# Patient Record
Sex: Female | Born: 1960 | Race: White | Hispanic: No | Marital: Married | Smoking: Never smoker
Health system: Southern US, Community
[De-identification: ages and names within clinical notes are randomized; demographics above are authoritative.]

## PROBLEM LIST (undated history)

## (undated) DIAGNOSIS — D509 Iron deficiency anemia, unspecified: Secondary | ICD-10-CM

## (undated) DIAGNOSIS — K760 Fatty (change of) liver, not elsewhere classified: Secondary | ICD-10-CM

## (undated) DIAGNOSIS — I890 Lymphedema, not elsewhere classified: Secondary | ICD-10-CM

## (undated) DIAGNOSIS — M545 Low back pain, unspecified: Secondary | ICD-10-CM

## (undated) DIAGNOSIS — M1990 Unspecified osteoarthritis, unspecified site: Secondary | ICD-10-CM

## (undated) DIAGNOSIS — K900 Celiac disease: Secondary | ICD-10-CM

## (undated) DIAGNOSIS — M5412 Radiculopathy, cervical region: Secondary | ICD-10-CM

## (undated) DIAGNOSIS — G5603 Carpal tunnel syndrome, bilateral upper limbs: Secondary | ICD-10-CM

## (undated) DIAGNOSIS — D802 Selective deficiency of immunoglobulin A [IgA]: Secondary | ICD-10-CM

## (undated) DIAGNOSIS — R768 Other specified abnormal immunological findings in serum: Secondary | ICD-10-CM

## (undated) DIAGNOSIS — R519 Headache, unspecified: Secondary | ICD-10-CM

## (undated) DIAGNOSIS — I10 Essential (primary) hypertension: Secondary | ICD-10-CM

## (undated) DIAGNOSIS — G5601 Carpal tunnel syndrome, right upper limb: Secondary | ICD-10-CM

## (undated) DIAGNOSIS — T8859XA Other complications of anesthesia, initial encounter: Secondary | ICD-10-CM

## (undated) DIAGNOSIS — J189 Pneumonia, unspecified organism: Secondary | ICD-10-CM

## (undated) DIAGNOSIS — R7689 Other specified abnormal immunological findings in serum: Secondary | ICD-10-CM

## (undated) DIAGNOSIS — D751 Secondary polycythemia: Secondary | ICD-10-CM

## (undated) DIAGNOSIS — G5793 Unspecified mononeuropathy of bilateral lower limbs: Secondary | ICD-10-CM

## (undated) DIAGNOSIS — R42 Dizziness and giddiness: Secondary | ICD-10-CM

## (undated) DIAGNOSIS — Z148 Genetic carrier of other disease: Secondary | ICD-10-CM

## (undated) DIAGNOSIS — K9041 Non-celiac gluten sensitivity: Secondary | ICD-10-CM

## (undated) DIAGNOSIS — K766 Portal hypertension: Secondary | ICD-10-CM

## (undated) DIAGNOSIS — G5602 Carpal tunnel syndrome, left upper limb: Secondary | ICD-10-CM

## (undated) DIAGNOSIS — M7500 Adhesive capsulitis of unspecified shoulder: Secondary | ICD-10-CM

## (undated) DIAGNOSIS — I340 Nonrheumatic mitral (valve) insufficiency: Secondary | ICD-10-CM

## (undated) DIAGNOSIS — G43909 Migraine, unspecified, not intractable, without status migrainosus: Secondary | ICD-10-CM

## (undated) DIAGNOSIS — G709 Myoneural disorder, unspecified: Secondary | ICD-10-CM

## (undated) DIAGNOSIS — K7460 Unspecified cirrhosis of liver: Secondary | ICD-10-CM

## (undated) DIAGNOSIS — K219 Gastro-esophageal reflux disease without esophagitis: Secondary | ICD-10-CM

## (undated) DIAGNOSIS — D649 Anemia, unspecified: Secondary | ICD-10-CM

## (undated) HISTORY — PX: APPENDECTOMY: SHX54

## (undated) HISTORY — PX: COLONOSCOPY WITH ESOPHAGOGASTRODUODENOSCOPY (EGD): SHX5779

## (undated) HISTORY — PX: JOINT REPLACEMENT: SHX530

## (undated) HISTORY — PX: TONSILLECTOMY: SUR1361

## (undated) HISTORY — PX: CHOLECYSTECTOMY: SHX55

## (undated) HISTORY — PX: WISDOM TOOTH EXTRACTION: SHX21

---
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
## 1981-12-19 HISTORY — PX: CHOLECYSTECTOMY: SHX55

## 2010-09-23 ENCOUNTER — Ambulatory Visit: Payer: Self-pay | Admitting: General Practice

## 2011-01-31 ENCOUNTER — Ambulatory Visit: Payer: Self-pay | Admitting: General Practice

## 2011-02-14 ENCOUNTER — Inpatient Hospital Stay: Payer: Self-pay | Admitting: General Practice

## 2011-02-14 HISTORY — PX: TOTAL HIP ARTHROPLASTY: SHX124

## 2011-05-15 ENCOUNTER — Ambulatory Visit: Payer: Self-pay | Admitting: General Practice

## 2011-09-08 ENCOUNTER — Ambulatory Visit: Payer: Self-pay | Admitting: Anesthesiology

## 2011-09-14 ENCOUNTER — Ambulatory Visit: Payer: Self-pay | Admitting: General Practice

## 2011-09-14 HISTORY — PX: KNEE ARTHROSCOPY: SHX127

## 2013-02-05 ENCOUNTER — Ambulatory Visit: Payer: Self-pay | Admitting: General Practice

## 2013-02-05 LAB — BASIC METABOLIC PANEL
Anion Gap: 6 — ABNORMAL LOW (ref 7–16)
BUN: 12 mg/dL (ref 7–18)
Calcium, Total: 9.4 mg/dL (ref 8.5–10.1)
Chloride: 107 mmol/L (ref 98–107)
Co2: 26 mmol/L (ref 21–32)
Creatinine: 0.58 mg/dL — ABNORMAL LOW (ref 0.60–1.30)
EGFR (African American): >60
EGFR (Non-African Amer.): >60
Glucose: 102 mg/dL — ABNORMAL HIGH (ref 65–99)
Osmolality: 277 (ref 275–301)
Potassium: 4 mmol/L (ref 3.5–5.1)
Sodium: 139 mmol/L (ref 136–145)

## 2013-02-05 LAB — APTT: Activated PTT: 26.6 secs (ref 23.6–35.9)

## 2013-02-05 LAB — URINALYSIS, COMPLETE
Bacteria: NONE SEEN
Bilirubin,UR: NEGATIVE
Blood: NEGATIVE
Glucose,UR: NEGATIVE mg/dL (ref 0–75)
Ketone: NEGATIVE
Leukocyte Esterase: NEGATIVE
Nitrite: NEGATIVE
Ph: 6 (ref 4.5–8.0)
Protein: NEGATIVE
RBC,UR: <1 /HPF (ref 0–5)
Specific Gravity: 1.017 (ref 1.003–1.030)
Squamous Epithelial: 1
WBC UR: <1 /HPF (ref 0–5)

## 2013-02-05 LAB — CBC
HCT: 46.7 % (ref 35.0–47.0)
MCH: 31.8 pg (ref 26.0–34.0)
MCHC: 34.6 g/dL (ref 32.0–36.0)
Platelet: 236 10*3/uL (ref 150–440)

## 2013-02-05 LAB — SEDIMENTATION RATE: Erythrocyte Sed Rate: 2 mm/hr (ref 0–30)

## 2013-02-05 LAB — PROTIME-INR: INR: 0.9

## 2013-02-05 LAB — MRSA PCR SCREENING

## 2013-02-07 LAB — URINE CULTURE

## 2013-02-18 ENCOUNTER — Inpatient Hospital Stay: Payer: Self-pay | Admitting: General Practice

## 2013-02-18 HISTORY — PX: TOTAL KNEE ARTHROPLASTY: SHX125

## 2013-02-19 LAB — BASIC METABOLIC PANEL
BUN: 10 mg/dL (ref 7–18)
Chloride: 105 mmol/L (ref 98–107)
Co2: 28 mmol/L (ref 21–32)
EGFR (African American): >60
Glucose: 135 mg/dL — ABNORMAL HIGH (ref 65–99)
Osmolality: 275 (ref 275–301)
Potassium: 3.7 mmol/L (ref 3.5–5.1)
Sodium: 137 mmol/L (ref 136–145)

## 2013-02-19 LAB — PLATELET COUNT: Platelet: 186 10*3/uL (ref 150–440)

## 2013-02-19 LAB — HEMOGLOBIN: HGB: 14.2 g/dL (ref 12.0–16.0)

## 2013-02-20 LAB — BASIC METABOLIC PANEL
Anion Gap: 7 (ref 7–16)
Calcium, Total: 8.7 mg/dL (ref 8.5–10.1)
Chloride: 104 mmol/L (ref 98–107)
Co2: 26 mmol/L (ref 21–32)
Creatinine: 0.54 mg/dL — ABNORMAL LOW (ref 0.60–1.30)
Glucose: 126 mg/dL — ABNORMAL HIGH (ref 65–99)
Potassium: 3.7 mmol/L (ref 3.5–5.1)
Sodium: 137 mmol/L (ref 136–145)

## 2013-02-20 LAB — PLATELET COUNT: Platelet: 185 10*3/uL (ref 150–440)

## 2013-02-20 LAB — HEMOGLOBIN: HGB: 13.8 g/dL (ref 12.0–16.0)

## 2013-04-24 ENCOUNTER — Ambulatory Visit: Payer: Self-pay | Admitting: Family Medicine

## 2013-08-22 ENCOUNTER — Ambulatory Visit: Payer: Self-pay | Admitting: Unknown Physician Specialty

## 2013-08-22 HISTORY — PX: COLONOSCOPY: SHX174

## 2014-07-20 ENCOUNTER — Emergency Department: Payer: Self-pay | Admitting: Emergency Medicine

## 2014-12-09 ENCOUNTER — Ambulatory Visit: Payer: Self-pay | Admitting: Family Medicine

## 2015-04-10 NOTE — Discharge Summary (Signed)
PATIENT NAME:  Tamara Dodson, Tamara Dodson MR#:  [id number] DATE OF BIRTH:  [birth year]  DATE OF [place] FordyceDMISSION:  02/18/2013 DATE OF DISCHARGE:  02/20/2013  ADMITTING DIAGNOSIS:  Degenerative arthrosis of the left knee.   DISCHARGE DIAGNOSIS:  Degenerative arthrosis of the left knee.   HISTORY:  The patient is a pleasant 54 year old female who has been [facility] for progression of left knee pain. She had reported some transient improvement in her left knee pain following cortisone injections as well as a series of Synvisc injections. However, she had progressed in discomfort to the left knee over the last several months. The left knee pain was noted be more symptomatic than the right. The pain was aggravated with weightbearing activities. She had localized most of the pain along the medial aspect of the knee. Occasionally, the patient was noted to have some swelling. She has had significant difficulty with arising from a sitting position as well as stair ambulation. At the time of surgery, she was not using any ambulatory aid. The patient actually has been having some difficulty performing her job duties because of the pain. Her pain had progressed to the point that it was significantly interfering with her activities of daily living. [facility] taken [facility] showed narrowing of the medial cartilage space with associated varus alignment. She was noted to have osteophytes as well as subchondral sclerosis. Ater discussion of the risks and benefits of surgical intervention, the patient expressed her understanding of the risks and benefits and agreed for plans for surgical intervention.   PROCEDURE:  Left total knee arthroplasty using computer-assisted navigation.   ANESTHESIA:  Femoral nerve block with spinal.   SOFT TISSUE RELEASE:  Anterior cruciate ligament, posterior cruciate ligament, deep and superficial medial collateral ligaments as well as the patellofemoral ligament.   IMPLANTS UTILIZED:   DePuy PFC Sigma size 4 posterior stabilized femoral component (cemented), size 4 MBT tibial component (cemented), 38 mm 3-peg oval dome patella (cemented), and a 10 mm stabilized rotating platform polyethylene insert.   HOSPITAL COURSE:  The patient tolerated the procedure very well. She had no complications. She was then taken to the PACU where she was stabilized and then transferred to the orthopedic floor. The patient began receiving anticoagulation therapy of Lovenox 40 mg subcutaneous q. 12 hours per anesthesia and pharmacy protocol. She was fitted with TED stockings bilaterally. These were allowed to be removed 1 hour per 8 hour shift. The left one was applied on day 2 following removal of the Hemovac and dressing change. The patient was also fitted with the AV-I compression foot pumps bilaterally set at 80 mmHg. Her calves have been nontender. There has been no evidence of any DVTs. Heels were elevated off the bed using rolled towels.   The patient has denied any chest pain or shortness of breath. Vital signs have been stable. She has been afebrile. Hemodynamically, she was stable. No transfusions were given other than the Autovac transfusion given the first 6 hours postoperatively.   Physical therapy was initiated on day 1 for gait training and transfers. Upon being discharged, she was ambulating greater than 400 feet. She was able to go up and down 4 sets of steps. She was independent with bed to chair transfers. Occupational therapy was also initiated on day 1 for activities of daily living and assistive devices.   The patient's IV, Foley and Hemovac were discontinued on day 2 along with the dressing change. The stocking was applied to the surgical leg.  The Polar Care was reapplied to the surgical leg as well to maintain a temperature of 40 to 50 degrees Fahrenheit.   DISPOSITION:   1.  The patient is being discharged to home in improved stable condition.  2.  Continue weightbearing as  tolerated.  3.  Continue using a walker until cleared by physical therapy to go to a quad cane.  4.  She will receive home health PT.  5.  Continue TED stockings. These are to be worn during the day, but may be removed at night.  6.  Continue Polar Care maintaining a temperature of 40 to 50 degrees Fahrenheit.  7.  She is placed on a regular diet.  8.  She is to follow up in the clinic in 2 weeks.  9.  She is to call the clinic sooner if any temperatures of 101.5 or greater or excessive bleeding.  10.  She is to resume her regular medication that she was on prior to admission.  11.  She is given a prescription for oxycodone 5 to 10 mg every 4 to 6 hours p.r.n. for severe pain and tramadol 50 to 100 mg every 4 to 6 hours p.r.n. for mild to moderate pain. Also, a prescription for Lovenox 40 mg subcutaneously daily for 14 days and then discontinue and begin taking one 81 mg enteric-coated aspirin per day.   PAST MEDICAL HISTORY:   1.  Chickenpox.  2.  Morbid obesity.    ____________________________ Van Clines, PA jrw:si D: 02/20/2013 15:44:45 ET T: 02/20/2013 16:48:48 ET JOB#: [id number]  cc: Van Clines, PA, <Dictator> JON WOLFE PA ELECTRONICALLY SIGNED 02/20/2013 19:57

## 2015-04-10 NOTE — Op Note (Signed)
PATIENT NAME:  Tamara FordyceKRESS, Tien L MR#:  [id number] DATE OF BIRTH:  06/24/1961  DATE OF PROCEDURE:  02/18/2013  PREOPERATIVE DIAGNOSIS: Degenerative arthrosis of the left knee.   POSTOPERATIVE DIAGNOSIS: Degenerative arthrosis of the left knee.   PROCEDURE PERFORMED: Left total knee arthroplasty using computer-assisted navigation.   SURGEON: Illene LabradorJames P. Hooten, M.D.  ASSISTANT:  Van ClinesJon Wolfe, PA  ANESTHESIA: Femoral nerve block and spinal.   ESTIMATED BLOOD LOSS: 100 mL.   FLUIDS REPLACED: 2000 mL crystalloid.   TOURNIQUET TIME: 98 minutes.   DRAINS: Two medium drains to reinfusion system.   SOFT TISSUE RELEASES: Anterior cruciate ligament, posterior cruciate ligament, deep and superficial medial collateral ligament, and patellofemoral ligament.   IMPLANTS UTILIZED: DePuy PFC Sigma size 4 posterior stabilized femoral component (cemented), size 4 MBT tibial component (cemented), 38 mm 3-pegged oval dome patella (cemented), and a 10 mm stabilized rotating platform polyethylene insert.   INDICATIONS FOR SURGERY: The patient is a 54 year old female who has been seen for complaints of progressive left knee pain. X-rays demonstrated significant degenerative changes with relative varus deformity. After discussion of the risks and benefits of surgical intervention, the patient expressed understanding of the risks, benefits and agreed with plans for surgical intervention.   PROCEDURE IN DETAIL: The patient was brought to the operating room and, after adequate femoral nerve block and spinal anesthesia was achieved, a tourniquet was placed on the patient's upper left thigh. The patient's left knee and leg were cleaned and prepped and draped in the usual sterile fashion. A "timeout" was performed as per usual protocol. The left lower extremity was exsanguinated using an Esmarch, and the tourniquet was inflated to 300 mmHg. An anterior-longitudinal incision was made followed by a standard mid vastus approach. A  moderate effusion was evacuated. The deep fibers of the medial collateral ligament were elevated in subperiosteal fashion off the medial flare of the tibia so as to maintain a continuous soft tissue sleeve. The patella was subluxed laterally and the patellofemoral ligament was incised. Inspection of the knee demonstrated severe degenerative changes with full thickness loss of articular cartilage, most notably to the medial compartment. Prominent osteophytes were debrided using rongeur. Anterior and posterior cruciate ligaments were excised. Two 4.0 mm Schanz pins were inserted into the femur and into the tibia for attachment of the array of trackers used for computer-assisted navigation. Hip center was identified using a circumduction technique. Distal landmarks were mapped using the computer. The distal femur and proximal tibia were mapped using the computer. Distal femoral cutting guide was positioned using computer-assisted navigation so as to achieve 5 degree distal valgus cut. Cut was performed and verified using the computer. Distal femur was sized and it was felt that a size 4 femoral component was appropriate. A size 4 cutting guide was positioned and the anterior cut was performed and verified using the computer. This was followed by completion of the posterior and chamfer cuts. Femoral cutting guide for the central box was then positioned and the central box cut was performed. Attention was then directed to the proximal tibia. Medial and lateral menisci were excised. The extramedullary tibial cutting was positioned using computer-assisted navigation so as to achieve 0 degree varus valgus alignment and 0 degree posterior slope. Cut was performed and verified using the computer. The proximal tibia was sized and it was felt that a size 4 tibial tray was appropriate. Tibial and femoral trials were inserted followed by insertion of a 10 mm polyethylene insert. The knee was felt to  be tight medially. A Cobb  elevator was used to elevate the superficial fibers of the medial collateral ligament. This allowed for excellent mediolateral soft tissue balancing both in full extension and in flexion. Finally, the patella was cut and prepared so as to accommodate a 38 mm 3-pegged oval dome patella. Patellar trial was placed and the knee was placed through a range of motion with excellent patellar tracking appreciated.   Femoral trial was removed after debridement of posterior osteophytes. The central post hole for the tibial component was reamed followed by insertion of a keel punch. Tibial trials were removed. The cut surfaces of bone were irrigated with copious amounts of normal saline with antibiotic solution using pulsatile lavage and then suctioned dry. Polymethyl methacrylate cement with gentamicin was prepared in the usual fashion using a vacuum mixer. Cement was applied to the cut surface of the proximal tibia as well as along the undersurface of a size 4 MBT tibial component. Tibial component was positioned and impacted into place. Excess cement was removed using freer elevators. Cement was applied to the cut surface of the femur as well as along the posterior flanges of a size 4 posterior stabilized femoral component. Femoral component was positioned and impacted into place. Excess cement was removed using freer elevators. A 10 mm polyethylene trial was inserted and the knee was brought into full extension with steady axial compression applied. Finally, cement was applied to the backside of a 38 mm 3-pegged oval dome patella and the patellar component was positioned and patellar clamp applied. Excess cement was removed using freer elevators.   After adequate curing of cement, the tourniquet was deflated after total tourniquet time of 98 minutes. Hemostasis was achieved using electrocautery. The knee was irrigated with copious amounts of normal saline with antibiotic solution using pulsatile lavage and then  suctioned dry. The knee was inspected for any residual cement debris. 30 mL of 0.25% Marcaine with epinephrine was injected along the posterior capsule. A 10 mm stabilized rotating platform polyethylene insert was inserted and the knee was placed through a range of motion. Excellent patellar tracking was appreciated and excellent mediolateral soft tissue balancing was appreciated in extension and in flexion. Two medium drains were placed in the wound bed and brought out through a separate stab incision to be attached to a reinfusion system. The medial parapatellar portion of the incision was reapproximated using interrupted sutures of #1 Vicryl. The subcutaneous tissue was approximated in layers using first #0 Vicryl followed by #2-0 Vicryl. Skin was closed with skin staples. A sterile dressing was applied.   The patient tolerated the procedure well. She was transported to the recovery room in stable condition.  ____________________________ Illene Labrador. Angie Fava., MD jph:sb D: 02/18/2013 17:54:04 ET T: 02/19/2013 09:40:09 ET JOB#: [id number]  cc: Fayrene Fearing P. Angie Fava., MD, <Dictator> Illene Labrador Angie Fava MD ELECTRONICALLY SIGNED 02/26/2013 18:21

## 2015-09-03 ENCOUNTER — Encounter
Admission: RE | Admit: 2015-09-03 | Discharge: 2015-09-03 | Disposition: A | Discharge status: Home / Self Care | Payer: BLUE CROSS/BLUE SHIELD | Source: Ambulatory Visit | Attending: Orthopedic Surgery | Admitting: Orthopedic Surgery

## 2015-09-03 DIAGNOSIS — M1711 Unilateral primary osteoarthritis, right knee: Secondary | ICD-10-CM | POA: Insufficient documentation

## 2015-09-03 DIAGNOSIS — Z01812 Encounter for preprocedural laboratory examination: Secondary | ICD-10-CM | POA: Insufficient documentation

## 2015-09-03 DIAGNOSIS — Z01810 Encounter for preprocedural cardiovascular examination: Secondary | ICD-10-CM | POA: Diagnosis not present

## 2015-09-03 HISTORY — DX: Essential (primary) hypertension: I10

## 2015-09-03 HISTORY — DX: Unspecified osteoarthritis, unspecified site: M19.90

## 2015-09-03 HISTORY — DX: Lymphedema, not elsewhere classified: I89.0

## 2015-09-03 HISTORY — DX: Anemia, unspecified: D64.9

## 2015-09-03 LAB — CBC
HCT: 45.3 % (ref 35.0–47.0)
HEMOGLOBIN: 15.6 g/dL (ref 12.0–16.0)
MCH: 31.9 pg (ref 26.0–34.0)
MCHC: 34.3 g/dL (ref 32.0–36.0)
MCV: 92.8 fL (ref 80.0–100.0)
PLATELETS: 202 10*3/uL (ref 150–440)
RBC: 4.88 MIL/uL (ref 3.80–5.20)
RDW: 12.8 % (ref 11.5–14.5)
WBC: 9.2 10*3/uL (ref 3.6–11.0)

## 2015-09-03 LAB — URINALYSIS COMPLETE WITH MICROSCOPIC (ARMC ONLY)
BACTERIA UA: NONE SEEN
Bilirubin Urine: NEGATIVE
Glucose, UA: NEGATIVE mg/dL
HGB URINE DIPSTICK: NEGATIVE
Ketones, ur: NEGATIVE mg/dL
LEUKOCYTES UA: NEGATIVE
Nitrite: NEGATIVE
PH: 5 (ref 5.0–8.0)
PROTEIN: NEGATIVE mg/dL
Specific Gravity, Urine: 1.015 (ref 1.005–1.030)

## 2015-09-03 LAB — SURGICAL PCR SCREEN
MRSA, PCR: NEGATIVE
Staphylococcus aureus: POSITIVE — AB

## 2015-09-03 LAB — TYPE AND SCREEN
ABO/RH(D): O NEG
ANTIBODY SCREEN: NEGATIVE

## 2015-09-03 LAB — BASIC METABOLIC PANEL
Anion gap: 8 (ref 5–15)
BUN: 10 mg/dL (ref 6–20)
CALCIUM: 9.5 mg/dL (ref 8.9–10.3)
CO2: 26 mmol/L (ref 22–32)
CREATININE: 0.71 mg/dL (ref 0.44–1.00)
Chloride: 107 mmol/L (ref 101–111)
GFR calc non Af Amer: >60 mL/min (ref >60)
Glucose, Bld: 109 mg/dL — ABNORMAL HIGH (ref 65–99)
Potassium: 3.7 mmol/L (ref 3.5–5.1)
SODIUM: 141 mmol/L (ref 135–145)

## 2015-09-03 LAB — ABO/RH: ABO/RH(D): O NEG

## 2015-09-03 LAB — PROTIME-INR
INR: 1
PROTHROMBIN TIME: 13.4 s (ref 11.4–15.0)

## 2015-09-03 LAB — APTT: APTT: 26 s (ref 24–36)

## 2015-09-03 NOTE — Patient Instructions (Addendum)
  Your procedure is scheduled on: 09/21/15 Report to Day Surgery. To find out your arrival time please call [phone] between 1PM - 3PM on 9/30.  Remember: Instructions that are not followed completely may result in serious medical risk, up to and including death, or upon the discretion of your surgeon and anesthesiologist your surgery may need to be rescheduled.    __x__ 1. Do not eat food or drink liquids after midnight. No gum chewing or hard candies.     __x__ 2. No Alcohol for 24 hours before or after surgery.   ____ 3. Bring all medications with you on the day of surgery if instructed.    __x__ 4. Notify your doctor if there is any change in your medical condition     (cold, fever, infections).     Do not wear jewelry, make-up, hairpins, clips or nail polish.  Do not wear lotions, powders, or perfumes. You may wear deodorant.  Do not shave 48 hours prior to surgery. Men may shave face and neck.  Do not bring valuables to the hospital.    [facility] is not responsible for any belongings or valuables.               Contacts, dentures or bridgework may not be worn into surgery.  Leave your suitcase in the car. After surgery it may be brought to your room.  For patients admitted to the hospital, discharge time is determined by your                treatment team.   Patients discharged the day of surgery will not be allowed to drive home.   Please read over the following fact sheets that you were given:   Surgical Site Infection Prevention MRSA  ____ Take these medicines the morning of surgery with A SIP OF WATER:    1. Amlodipine  2. Tylenol as needed  3.   4.  5.  6.  ____ Fleet Enema (as directed)   __x__ Use CHG Soap as directed  ____ Use inhalers on the day of surgery  ____ Stop metformin 2 days prior to surgery    ____ Take 1/2 of usual insulin dose the night before surgery and none on the morning of surgery.   __x__ Stop Coumadin/Plavix/aspirin on  9/26  ___x_ Stop Anti-inflammatories on 9/26   __x__ Stop supplements until after surgery. 9/26 can continue Iron   ____ Bring C-Pap to the hospital.

## 2015-09-04 LAB — URINE CULTURE
Culture: NO GROWTH
Special Requests: NORMAL

## 2015-09-07 ENCOUNTER — Encounter
Admission: RE | Admit: 2015-09-07 | Discharge: 2015-09-07 | Disposition: A | Discharge status: Home / Self Care | Payer: BLUE CROSS/BLUE SHIELD | Source: Ambulatory Visit | Attending: Orthopedic Surgery | Admitting: Orthopedic Surgery

## 2015-09-07 DIAGNOSIS — Z01818 Encounter for other preprocedural examination: Secondary | ICD-10-CM | POA: Diagnosis not present

## 2015-09-07 LAB — SEDIMENTATION RATE: SED RATE: 1 mm/h (ref 0–30)

## 2015-09-08 NOTE — OR Nursing (Signed)
AS INSTRUCTED BY DR Charlann Boxer, REQUEST FOR CLEARANCE CALLED AND FAXED TO DR HOOTN OFFICE. SPOKE WITH LINDA

## 2015-09-14 DIAGNOSIS — I890 Lymphedema, not elsewhere classified: Secondary | ICD-10-CM | POA: Insufficient documentation

## 2015-09-14 DIAGNOSIS — I10 Essential (primary) hypertension: Secondary | ICD-10-CM | POA: Insufficient documentation

## 2015-09-15 NOTE — OR Nursing (Signed)
CLEARED BY DR Lady Gary

## 2015-09-21 ENCOUNTER — Encounter
Admission: RE | Disposition: A | Discharge status: Home / Self Care | Payer: Self-pay | Source: Ambulatory Visit | Attending: Orthopedic Surgery

## 2015-09-21 ENCOUNTER — Inpatient Hospital Stay: Payer: BLUE CROSS/BLUE SHIELD | Admitting: Anesthesiology

## 2015-09-21 ENCOUNTER — Inpatient Hospital Stay: Payer: BLUE CROSS/BLUE SHIELD

## 2015-09-21 ENCOUNTER — Inpatient Hospital Stay
Admission: RE | Admit: 2015-09-21 | Discharge: 2015-09-24 | DRG: 470 | Disposition: A | Discharge status: Home / Self Care | Payer: BLUE CROSS/BLUE SHIELD | Source: Ambulatory Visit | Attending: Orthopedic Surgery | Admitting: Orthopedic Surgery

## 2015-09-21 ENCOUNTER — Encounter: Payer: Self-pay | Admitting: *Deleted

## 2015-09-21 DIAGNOSIS — M25561 Pain in right knee: Secondary | ICD-10-CM | POA: Diagnosis present

## 2015-09-21 DIAGNOSIS — Z96652 Presence of left artificial knee joint: Secondary | ICD-10-CM

## 2015-09-21 DIAGNOSIS — Z7982 Long term (current) use of aspirin: Secondary | ICD-10-CM

## 2015-09-21 DIAGNOSIS — M1711 Unilateral primary osteoarthritis, right knee: Secondary | ICD-10-CM | POA: Diagnosis present

## 2015-09-21 DIAGNOSIS — Z6841 Body Mass Index (BMI) 40.0 and over, adult: Secondary | ICD-10-CM | POA: Diagnosis not present

## 2015-09-21 DIAGNOSIS — D649 Anemia, unspecified: Secondary | ICD-10-CM | POA: Diagnosis present

## 2015-09-21 DIAGNOSIS — I10 Essential (primary) hypertension: Secondary | ICD-10-CM | POA: Diagnosis present

## 2015-09-21 DIAGNOSIS — Z96659 Presence of unspecified artificial knee joint: Secondary | ICD-10-CM

## 2015-09-21 HISTORY — PX: KNEE ARTHROPLASTY: SHX992

## 2015-09-21 LAB — CREATININE, SERUM
CREATININE: 0.62 mg/dL (ref 0.44–1.00)
GFR calc Af Amer: >60 mL/min (ref >60)

## 2015-09-21 LAB — TYPE AND SCREEN
ABO/RH(D): O NEG
ANTIBODY SCREEN: NEGATIVE

## 2015-09-21 SURGERY — ARTHROPLASTY, KNEE, TOTAL, USING IMAGELESS COMPUTER-ASSISTED NAVIGATION
Anesthesia: Spinal | Laterality: Right | Wound class: Clean

## 2015-09-21 MED ORDER — MIDAZOLAM HCL 5 MG/5ML IJ SOLN
INTRAMUSCULAR | Status: DC | PRN
  Administered 2015-09-21: 1 mg via INTRAVENOUS
  Administered 2015-09-21: 2 mg via INTRAVENOUS
  Administered 2015-09-21: 1 mg via INTRAVENOUS

## 2015-09-21 MED ORDER — DM-GUAIFENESIN ER 30-600 MG PO TB12: 1.0000 | ORAL_TABLET | Freq: Two times a day (BID) | ORAL | Status: DC | PRN

## 2015-09-21 MED ORDER — MORPHINE SULFATE (PF) 2 MG/ML IV SOLN
2.0000 mg | INTRAVENOUS | Status: DC | PRN
  Administered 2015-09-21: 2 mg via INTRAVENOUS
  Filled 2015-09-21: qty 1

## 2015-09-21 MED ORDER — ACETAMINOPHEN 10 MG/ML IV SOLN
1000.0000 mg | Freq: Four times a day (QID) | INTRAVENOUS | Status: AC
  Administered 2015-09-21 – 2015-09-22 (×3): 1000 mg via INTRAVENOUS
  Filled 2015-09-21 (×4): qty 100

## 2015-09-21 MED ORDER — ADULT MULTIVITAMIN W/MINERALS CH
1.0000 | ORAL_TABLET | Freq: Every day | ORAL | Status: DC
  Administered 2015-09-22 – 2015-09-24 (×3): 1 via ORAL
  Filled 2015-09-21 (×5): qty 1

## 2015-09-21 MED ORDER — ENOXAPARIN SODIUM 40 MG/0.4ML ~~LOC~~ SOLN
40.0000 mg | Freq: Two times a day (BID) | SUBCUTANEOUS | Status: DC
  Administered 2015-09-22 – 2015-09-24 (×4): 40 mg via SUBCUTANEOUS
  Filled 2015-09-21 (×5): qty 0.4

## 2015-09-21 MED ORDER — SODIUM CHLORIDE 0.9 % IJ SOLN
INTRAMUSCULAR | Status: AC
  Filled 2015-09-21: qty 3

## 2015-09-21 MED ORDER — TRANEXAMIC ACID 1000 MG/10ML IV SOLN
1000.0000 mg | Freq: Once | INTRAVENOUS | Status: AC
  Administered 2015-09-21: 1000 mg via INTRAVENOUS
  Filled 2015-09-21: qty 10

## 2015-09-21 MED ORDER — SODIUM CHLORIDE 0.9 % IJ SOLN
INTRAMUSCULAR | Status: AC
  Filled 2015-09-21: qty 50

## 2015-09-21 MED ORDER — OMEGA-3-ACID ETHYL ESTERS 1 G PO CAPS
1.0000 g | ORAL_CAPSULE | Freq: Every morning | ORAL | Status: DC
  Administered 2015-09-22 – 2015-09-24 (×3): 1 g via ORAL
  Filled 2015-09-21 (×3): qty 1

## 2015-09-21 MED ORDER — FAMOTIDINE 20 MG PO TABS
20.0000 mg | ORAL_TABLET | Freq: Once | ORAL | Status: AC
  Administered 2015-09-21: 20 mg via ORAL

## 2015-09-21 MED ORDER — METOCLOPRAMIDE HCL 10 MG PO TABS
10.0000 mg | ORAL_TABLET | Freq: Three times a day (TID) | ORAL | Status: AC
  Administered 2015-09-21 – 2015-09-23 (×7): 10 mg via ORAL
  Filled 2015-09-21 (×9): qty 1

## 2015-09-21 MED ORDER — FISH OIL 1200 MG PO CPDR: 1200.0000 mg | DELAYED_RELEASE_CAPSULE | Freq: Every morning | ORAL | Status: DC

## 2015-09-21 MED ORDER — MAGNESIUM HYDROXIDE 400 MG/5ML PO SUSP
30.0000 mL | Freq: Every day | ORAL | Status: DC | PRN
  Administered 2015-09-22: 30 mL via ORAL
  Filled 2015-09-21 (×2): qty 30

## 2015-09-21 MED ORDER — BUPIVACAINE-EPINEPHRINE 0.25% -1:200000 IJ SOLN
INTRAMUSCULAR | Status: DC | PRN
  Administered 2015-09-21: 30 mL

## 2015-09-21 MED ORDER — FERROUS SULFATE 325 (65 FE) MG PO TABS
325.0000 mg | ORAL_TABLET | Freq: Three times a day (TID) | ORAL | Status: DC
  Administered 2015-09-22 – 2015-09-24 (×7): 325 mg via ORAL
  Filled 2015-09-21 (×7): qty 1

## 2015-09-21 MED ORDER — HYDROMORPHONE HCL 1 MG/ML IJ SOLN
0.5000 mg | Freq: Once | INTRAMUSCULAR | Status: AC
  Administered 2015-09-21: 0.5 mg via INTRAVENOUS
  Filled 2015-09-21: qty 1

## 2015-09-21 MED ORDER — FENTANYL CITRATE (PF) 100 MCG/2ML IJ SOLN
INTRAMUSCULAR | Status: DC | PRN
  Administered 2015-09-21 (×2): 50 ug via INTRAVENOUS

## 2015-09-21 MED ORDER — ALUM & MAG HYDROXIDE-SIMETH 200-200-20 MG/5ML PO SUSP: 30.0000 mL | ORAL | Status: DC | PRN

## 2015-09-21 MED ORDER — FUROSEMIDE 20 MG PO TABS
20.0000 mg | ORAL_TABLET | Freq: Every morning | ORAL | Status: DC
  Administered 2015-09-22 – 2015-09-23 (×2): 20 mg via ORAL
  Filled 2015-09-21 (×3): qty 1

## 2015-09-21 MED ORDER — ACETAMINOPHEN 10 MG/ML IV SOLN
INTRAVENOUS | Status: DC | PRN
  Administered 2015-09-21: 1000 mg via INTRAVENOUS

## 2015-09-21 MED ORDER — CEFAZOLIN SODIUM 10 G IJ SOLR
3.0000 g | Freq: Four times a day (QID) | INTRAMUSCULAR | Status: AC
  Administered 2015-09-21 – 2015-09-22 (×4): 3 g via INTRAVENOUS
  Filled 2015-09-21 (×4): qty 3000

## 2015-09-21 MED ORDER — GUAIFENESIN ER 600 MG PO TB12
600.0000 mg | ORAL_TABLET | Freq: Two times a day (BID) | ORAL | Status: DC
  Administered 2015-09-21 – 2015-09-24 (×5): 600 mg via ORAL
  Filled 2015-09-21 (×6): qty 1

## 2015-09-21 MED ORDER — BUPIVACAINE HCL (PF) 0.5 % IJ SOLN
INTRAMUSCULAR | Status: DC | PRN
  Administered 2015-09-21: 3 mL

## 2015-09-21 MED ORDER — FLEET ENEMA 7-19 GM/118ML RE ENEM: 1.0000 | ENEMA | Freq: Once | RECTAL | Status: DC | PRN

## 2015-09-21 MED ORDER — ACETAMINOPHEN 10 MG/ML IV SOLN
INTRAVENOUS | Status: AC
  Filled 2015-09-21: qty 100

## 2015-09-21 MED ORDER — DEXTROMETHORPHAN POLISTIREX ER 30 MG/5ML PO SUER
30.0000 mg | Freq: Two times a day (BID) | ORAL | Status: DC
  Administered 2015-09-21 – 2015-09-24 (×6): 30 mg via ORAL
  Filled 2015-09-21 (×8): qty 5

## 2015-09-21 MED ORDER — FAMOTIDINE 20 MG PO TABS
ORAL_TABLET | ORAL | Status: AC
  Administered 2015-09-21: 20 mg via ORAL
  Filled 2015-09-21: qty 1

## 2015-09-21 MED ORDER — TRAMADOL HCL 50 MG PO TABS
50.0000 mg | ORAL_TABLET | ORAL | Status: DC | PRN
  Administered 2015-09-22: 50 mg via ORAL
  Filled 2015-09-21: qty 2
  Filled 2015-09-21: qty 1

## 2015-09-21 MED ORDER — DEXTROSE 5 % IV SOLN
3.0000 g | Freq: Once | INTRAVENOUS | Status: AC
  Administered 2015-09-21: 3 g via INTRAVENOUS
  Filled 2015-09-21: qty 3000

## 2015-09-21 MED ORDER — ACETAMINOPHEN 650 MG RE SUPP: 650.0000 mg | Freq: Four times a day (QID) | RECTAL | Status: DC | PRN

## 2015-09-21 MED ORDER — PHENOL 1.4 % MT LIQD: 1.0000 | OROMUCOSAL | Status: DC | PRN

## 2015-09-21 MED ORDER — TETRACAINE HCL 1 % IJ SOLN
INTRAMUSCULAR | Status: DC | PRN
  Administered 2015-09-21: 5 mg via INTRASPINAL

## 2015-09-21 MED ORDER — BUPIVACAINE LIPOSOME 1.3 % IJ SUSP
INTRAMUSCULAR | Status: AC
  Filled 2015-09-21: qty 20

## 2015-09-21 MED ORDER — LACTATED RINGERS IV SOLN
INTRAVENOUS | Status: DC
  Administered 2015-09-21 (×2): via INTRAVENOUS

## 2015-09-21 MED ORDER — ONDANSETRON HCL 4 MG PO TABS: 4.0000 mg | ORAL_TABLET | Freq: Four times a day (QID) | ORAL | Status: DC | PRN

## 2015-09-21 MED ORDER — ONDANSETRON HCL 4 MG/2ML IJ SOLN
INTRAMUSCULAR | Status: DC | PRN
  Administered 2015-09-21: 4 mg via INTRAVENOUS

## 2015-09-21 MED ORDER — MENTHOL 3 MG MT LOZG: 1.0000 | LOZENGE | OROMUCOSAL | Status: DC | PRN

## 2015-09-21 MED ORDER — DIPHENHYDRAMINE HCL 12.5 MG/5ML PO ELIX: 12.5000 mg | ORAL_SOLUTION | ORAL | Status: DC | PRN

## 2015-09-21 MED ORDER — KETAMINE HCL 50 MG/ML IJ SOLN
INTRAMUSCULAR | Status: DC | PRN
  Administered 2015-09-21 (×2): 50 mg via INTRAMUSCULAR

## 2015-09-21 MED ORDER — SODIUM CHLORIDE 0.9 % IV SOLN
INTRAVENOUS | Status: DC
  Administered 2015-09-21 – 2015-09-22 (×2): via INTRAVENOUS

## 2015-09-21 MED ORDER — SENNOSIDES-DOCUSATE SODIUM 8.6-50 MG PO TABS
1.0000 | ORAL_TABLET | Freq: Two times a day (BID) | ORAL | Status: DC
Start: 2015-09-21 — End: 2015-09-24
  Administered 2015-09-21 – 2015-09-24 (×4): 1 via ORAL
  Filled 2015-09-21 (×6): qty 1

## 2015-09-21 MED ORDER — BUPIVACAINE-EPINEPHRINE (PF) 0.25% -1:200000 IJ SOLN
INTRAMUSCULAR | Status: AC
  Filled 2015-09-21: qty 30

## 2015-09-21 MED ORDER — FENTANYL CITRATE (PF) 100 MCG/2ML IJ SOLN
25.0000 ug | INTRAMUSCULAR | Status: DC | PRN
  Administered 2015-09-21 (×2): 25 ug via INTRAVENOUS

## 2015-09-21 MED ORDER — PROPOFOL 500 MG/50ML IV EMUL
INTRAVENOUS | Status: DC | PRN
  Administered 2015-09-21 (×2): 100 ug/kg/min via INTRAVENOUS
  Administered 2015-09-21: 50 ug/kg/min via INTRAVENOUS
  Administered 2015-09-21: 12:00:00 via INTRAVENOUS

## 2015-09-21 MED ORDER — MORPHINE SULFATE (PF) 2 MG/ML IV SOLN
2.0000 mg | INTRAVENOUS | Status: DC | PRN
  Administered 2015-09-21 (×2): 2 mg via INTRAVENOUS
  Filled 2015-09-21 (×2): qty 1

## 2015-09-21 MED ORDER — FENTANYL CITRATE (PF) 100 MCG/2ML IJ SOLN
INTRAMUSCULAR | Status: AC
  Filled 2015-09-21: qty 2

## 2015-09-21 MED ORDER — OXYCODONE HCL 5 MG PO TABS
5.0000 mg | ORAL_TABLET | ORAL | Status: DC | PRN
  Administered 2015-09-21 (×2): 5 mg via ORAL
  Administered 2015-09-22 (×4): 10 mg via ORAL
  Administered 2015-09-23: 5 mg via ORAL
  Administered 2015-09-23 (×3): 10 mg via ORAL
  Administered 2015-09-24: 5 mg via ORAL
  Filled 2015-09-21: qty 1
  Filled 2015-09-21: qty 2
  Filled 2015-09-21 (×2): qty 1
  Filled 2015-09-21 (×3): qty 2
  Filled 2015-09-21: qty 1
  Filled 2015-09-21 (×2): qty 2

## 2015-09-21 MED ORDER — GLYCOPYRROLATE 0.2 MG/ML IJ SOLN
INTRAMUSCULAR | Status: DC | PRN
  Administered 2015-09-21: 0.2 mg via INTRAVENOUS

## 2015-09-21 MED ORDER — TRANEXAMIC ACID 1000 MG/10ML IV SOLN
1500.0000 mg | INTRAVENOUS | Status: AC
  Administered 2015-09-21: 1500 mg via INTRAVENOUS
  Filled 2015-09-21: qty 15

## 2015-09-21 MED ORDER — NEOMYCIN-POLYMYXIN B GU 40-200000 IR SOLN
Status: AC
  Filled 2015-09-21: qty 20

## 2015-09-21 MED ORDER — ONDANSETRON HCL 4 MG/2ML IJ SOLN: 4.0000 mg | Freq: Once | INTRAMUSCULAR | Status: DC | PRN

## 2015-09-21 MED ORDER — BISACODYL 10 MG RE SUPP: 10.0000 mg | Freq: Every day | RECTAL | Status: DC | PRN

## 2015-09-21 MED ORDER — ACETAMINOPHEN 325 MG PO TABS: 650.0000 mg | ORAL_TABLET | Freq: Four times a day (QID) | ORAL | Status: DC | PRN

## 2015-09-21 MED ORDER — PANTOPRAZOLE SODIUM 40 MG PO TBEC
40.0000 mg | DELAYED_RELEASE_TABLET | Freq: Two times a day (BID) | ORAL | Status: DC
  Administered 2015-09-21 – 2015-09-24 (×5): 40 mg via ORAL
  Filled 2015-09-21 (×6): qty 1

## 2015-09-21 MED ORDER — VITAMIN C 500 MG PO TABS
1000.0000 mg | ORAL_TABLET | Freq: Every day | ORAL | Status: DC
  Administered 2015-09-21 – 2015-09-24 (×4): 1000 mg via ORAL
  Filled 2015-09-21 (×4): qty 2

## 2015-09-21 MED ORDER — AMLODIPINE BESYLATE 5 MG PO TABS
5.0000 mg | ORAL_TABLET | Freq: Every morning | ORAL | Status: DC
  Administered 2015-09-22 – 2015-09-24 (×3): 5 mg via ORAL
  Filled 2015-09-21 (×3): qty 1

## 2015-09-21 MED ORDER — ONDANSETRON HCL 4 MG/2ML IJ SOLN: 4.0000 mg | Freq: Four times a day (QID) | INTRAMUSCULAR | Status: DC | PRN

## 2015-09-21 SURGICAL SUPPLY — 56 items
AUTOTRANSFUS HAS 1/8 (MISCELLANEOUS) ×2
BATTERY INSTRU NAVIGATION (MISCELLANEOUS) ×8 IMPLANT
BLADE SAW 1 (BLADE) ×2 IMPLANT
BLADE SAW 1/2 (BLADE) ×2 IMPLANT
BONE CEMENT GENTAMICIN (Cement) ×2 IMPLANT
CANISTER SUCT 1200ML W/VALVE (MISCELLANEOUS) ×2 IMPLANT
CANISTER SUCT 3000ML (MISCELLANEOUS) ×4 IMPLANT
CAP KNEE TOTAL 3 SIGMA ×2 IMPLANT
CATH TRAY METER 16FR LF (MISCELLANEOUS) ×2 IMPLANT
CEMENT BONE GENTAMICIN 40 (Cement) ×1 IMPLANT
COOLER POLAR GLACIER W/PUMP (MISCELLANEOUS) ×2 IMPLANT
DRAPE SHEET LG 3/4 BI-LAMINATE (DRAPES) ×2 IMPLANT
DRSG DERMACEA 8X12 NADH (GAUZE/BANDAGES/DRESSINGS) ×2 IMPLANT
DRSG OPSITE POSTOP 4X14 (GAUZE/BANDAGES/DRESSINGS) ×2 IMPLANT
DURAPREP 26ML APPLICATOR (WOUND CARE) ×4 IMPLANT
ELECT CAUTERY BLADE 6.4 (BLADE) ×2 IMPLANT
EX-PIN ORTHOLOCK NAV 4X150 (PIN) ×4 IMPLANT
GLOVE BIOGEL M STRL SZ7.5 (GLOVE) ×4 IMPLANT
GLOVE INDICATOR 8.0 STRL GRN (GLOVE) ×2 IMPLANT
GLOVE SURG 9.0 ORTHO LTXF (GLOVE) ×2 IMPLANT
GLOVE SURG ORTHO 9.0 STRL STRW (GLOVE) ×2 IMPLANT
GOWN STRL REUS W/ TWL LRG LVL3 (GOWN DISPOSABLE) ×2 IMPLANT
GOWN STRL REUS W/ TWL LRG LVL4 (GOWN DISPOSABLE) ×1 IMPLANT
GOWN STRL REUS W/TWL 2XL LVL3 (GOWN DISPOSABLE) ×2 IMPLANT
GOWN STRL REUS W/TWL LRG LVL3 (GOWN DISPOSABLE) ×2
GOWN STRL REUS W/TWL LRG LVL4 (GOWN DISPOSABLE) ×1
HANDPIECE SUCTION TUBG SURGILV (MISCELLANEOUS) ×2 IMPLANT
HOLDER FOLEY CATH W/STRAP (MISCELLANEOUS) ×2 IMPLANT
HOOD PEEL AWAY FACE SHEILD DIS (HOOD) ×4 IMPLANT
KIT RM TURNOVER STRD PROC AR (KITS) ×2 IMPLANT
KNIFE SCULPS 14X20 (INSTRUMENTS) ×2 IMPLANT
NDL SAFETY 18GX1.5 (NEEDLE) ×2 IMPLANT
NEEDLE SPNL 20GX3.5 QUINCKE YW (NEEDLE) ×2 IMPLANT
NS IRRIG 500ML POUR BTL (IV SOLUTION) ×2 IMPLANT
PACK TOTAL KNEE (MISCELLANEOUS) ×2 IMPLANT
PAD GROUND ADULT SPLIT (MISCELLANEOUS) ×2 IMPLANT
PAD WRAPON POLAR KNEE (MISCELLANEOUS) ×1 IMPLANT
PIN DRILL QUICK PACK ×2 IMPLANT
PIN FIXATION 1/8DIA X 3INL (PIN) ×2 IMPLANT
SOL .9 NS 3000ML IRR  AL (IV SOLUTION) ×1
SOL .9 NS 3000ML IRR UROMATIC (IV SOLUTION) ×1 IMPLANT
SOL PREP PVP 2OZ (MISCELLANEOUS) ×2
SOLUTION PREP PVP 2OZ (MISCELLANEOUS) ×1 IMPLANT
SPONGE DRAIN TRACH 4X4 STRL 2S (GAUZE/BANDAGES/DRESSINGS) ×2 IMPLANT
STAPLER SKIN PROX 35W (STAPLE) ×2 IMPLANT
SUCTION FRAZIER TIP 10 FR DISP (SUCTIONS) ×2 IMPLANT
SUT VIC AB 0 CT1 36 (SUTURE) ×2 IMPLANT
SUT VIC AB 1 CT1 36 (SUTURE) ×4 IMPLANT
SUT VIC AB 2-0 CT2 27 (SUTURE) ×2 IMPLANT
SYR 20CC LL (SYRINGE) ×2 IMPLANT
SYR 30ML LL (SYRINGE) ×2 IMPLANT
SYR 50ML LL SCALE MARK (SYRINGE) ×2 IMPLANT
SYSTEM AUTOTRANSFUS DUAL TROCR (MISCELLANEOUS) ×1 IMPLANT
TOWEL OR 17X26 4PK STRL BLUE (TOWEL DISPOSABLE) ×2 IMPLANT
TOWER CARTRIDGE SMART MIX (DISPOSABLE) ×2 IMPLANT
WRAPON POLAR PAD KNEE (MISCELLANEOUS) ×2

## 2015-09-21 NOTE — Progress Notes (Signed)
Pt in severe pain. Spinal has worn off . Reports "my pain is past a ten" . Dr Rosita Kea notified. md orders chg morphine frequency to q3hr prn . Adm dilaudid .  iv once and may repeat in 10 minutes if needed

## 2015-09-21 NOTE — Brief Op Note (Signed)
09/21/2015  3:18 PM  PATIENT:  Tamara Dodson  54 y.o. female  PRE-OPERATIVE DIAGNOSIS:  Degenerative arthrosis of right knee  POST-OPERATIVE DIAGNOSIS:  Degenerative arthrosis of right knee  PROCEDURE:  Procedure(s): COMPUTER ASSISTED TOTAL KNEE ARTHROPLASTY (Right)  SURGEON:  Surgeon(s) and Role:    * Donato Heinz, MD - Primary  ASSISTANTS: Van Clines, PA    ANESTHESIA:   spinal  EBL:  Total I/O In: 2000 [I.V.:2000] Out: 1250 [Urine:1100; Blood:150]  BLOOD ADMINISTERED:none  DRAINS: 2 drains to reinfusion system   LOCAL MEDICATIONS USED:  MARCAINE    and OTHER Exparel  SPECIMEN:  No Specimen  DISPOSITION OF SPECIMEN:  N/A  COUNTS:  YES  TOURNIQUET:  127 minutes  DICTATION: .Dragon Dictation  PLAN OF CARE: Admit to inpatient   PATIENT DISPOSITION:  PACU - hemodynamically stable.   Delay start of Pharmacological VTE agent (>24hrs) due to surgical blood loss or risk of bleeding: yes

## 2015-09-21 NOTE — Anesthesia Procedure Notes (Signed)
Spinal Patient location during procedure: OR Start time: 09/21/2015 11:15 AM End time: 09/21/2015 11:53 AM Staffing Anesthesiologist: Dennard Nip K Performed by: anesthesiologist  Preanesthetic Checklist Completed: patient identified, site marked, surgical consent, pre-op evaluation, timeout performed, IV checked, risks and benefits discussed and monitors and equipment checked Spinal Block Patient position: sitting Prep: Betadine Patient monitoring: heart rate, continuous pulse ox, blood pressure and cardiac monitor Approach: midline Location: L4-5 Injection technique: single-shot Needle Needle type: Whitacre and Introducer  Needle gauge: 24 G Needle length: 12.7 cm Needle insertion depth: 9 cm Assessment Sensory level: T10 Additional Notes Negative paresthesia. Negative blood return. Positive free-flowing CSF. Expiration date of kit checked and confirmed. Patient tolerated procedure well, without complications.

## 2015-09-21 NOTE — H&P (Signed)
The patient has been re-examined, and the chart reviewed, and there have been no interval changes to the documented history and physical.    The risks, benefits, and alternatives have been discussed at length. The patient expressed understanding of the risks benefits and agreed with plans for surgical intervention.  Teighan Aubert P. Matylda Fehring, Jr. M.D.    

## 2015-09-21 NOTE — Progress Notes (Signed)
ANTICOAGULATION CONSULT NOTE - Initial Consult  Pharmacy Consult for Lovenox Indication: VTE prophylaxis  No Known Allergies  Patient Measurements: Weight: (!) 334 lb 14.4 oz (151.91 kg) Heparin Dosing Weight:   Vital Signs: Temp: 97.5 F (36.4 C) (10/03 1804) Temp Source: Oral (10/03 1804) BP: 120/60 mmHg (10/03 1804) Pulse Rate: 62 (10/03 1804)  Labs:  Recent Labs  09/21/15 1707  CREATININE 0.62    Estimated Creatinine Clearance: 132.5 mL/min (by C-G formula based on Cr of 0.62).   Medical History: Past Medical History  Diagnosis Date  . Lymph edema     lower extremities  . Hypertension   . Arthritis   . Anemia     Medications:  Prescriptions prior to admission  Medication Sig Dispense Refill Last Dose  . amLODipine (NORVASC) 5 MG tablet Take 5 mg by mouth every morning.   09/21/2015 at Unknown time  . Ascorbic Acid (VITAMIN C) 1000 MG tablet Take 1,000 mg by mouth daily.   09/20/2015  . aspirin EC 81 MG tablet Take 81 mg by mouth every morning.   09/14/2015  . dextromethorphan-guaiFENesin (MUCINEX DM) 30-600 MG per 12 hr tablet Take 1 tablet by mouth 2 (two) times daily as needed for cough.   09/20/2015  . Echinacea 500 MG CAPS Take 1,520 mg by mouth.   09/14/2015  . ferrous sulfate 325 (65 FE) MG EC tablet Take 325 mg by mouth 3 (three) times daily with meals.   09/14/2015  . furosemide (LASIX) 20 MG tablet Take 20 mg by mouth every morning.   09/20/2015  . Multiple Vitamins-Minerals (MULTIVITAMIN WITH MINERALS) tablet Take 1 tablet by mouth daily.   09/14/2015  . Omega-3 Fatty Acids (FISH OIL) 1200 MG CPDR Take 1,200 mg by mouth every morning.   09/14/2015    Assessment: CrCl = 132.5 ml/min BMI = 44.2  Goal of Therapy:  DVT prophylaxis   Plan:  Lovenox 30 mg SQ Q12H originally ordered.  Will adjust dose to lovenox 40 mg SQ Q12H based on CrCl > 40.  Avrey Hyser D 09/21/2015,6:27 PM

## 2015-09-21 NOTE — Op Note (Signed)
OPERATIVE NOTE  DATE OF SURGERY:  09/21/2015  PATIENT NAME:  Tamara Dodson   DOB: 08/30/1961  MRN: [id number]  PRE-OPERATIVE DIAGNOSIS: Degenerative arthrosis of the right knee, primary  POST-OPERATIVE DIAGNOSIS:  Same  PROCEDURE:  Right total knee arthroplasty using computer-assisted navigation  SURGEON:  Jena Gauss. M.D.  ASSISTANT:  Van Clines, PA (present and scrubbed throughout the case, critical for assistance with exposure, retraction, instrumentation, and closure)  ANESTHESIA: spinal  ESTIMATED BLOOD LOSS: 150 mL  FLUIDS REPLACED: 2000 mL of crystalloid  TOURNIQUET TIME: 127 minutes  DRAINS: 2 medium drains to a reinfusion system  SOFT TISSUE RELEASES: Anterior cruciate ligament, posterior cruciate ligament, deep medial collateral ligament, patellofemoral ligament   IMPLANTS UTILIZED: DePuy PFC Sigma size 4 posterior stabilized femoral component (cemented), size 4 MBT tibial component (cemented), 38 mm 3 peg oval dome patella (cemented), and a 10 mm stabilized rotating platform polyethylene insert.  INDICATIONS FOR SURGERY: Tamara Dodson is a 54 y.o. year old female with a long history of progressive knee pain. X-rays demonstrated severe degenerative changes in tricompartmental fashion. The patient had not seen any significant improvement despite conservative nonsurgical intervention. After discussion of the risks and benefits of surgical intervention, the patient expressed understanding of the risks benefits and agree with plans for total knee arthroplasty.   The risks, benefits, and alternatives were discussed at length including but not limited to the risks of infection, bleeding, nerve injury, stiffness, blood clots, the need for revision surgery, cardiopulmonary complications, among others, and they were willing to proceed.  PROCEDURE IN DETAIL: The patient was brought into the operating room and, after adequate spinal anesthesia was achieved, a tourniquet was  placed on the patient's upper thigh. The patient's knee and leg were cleaned and prepped with alcohol and DuraPrep and draped in the usual sterile fashion. A "timeout" was performed as per usual protocol. The lower extremity was exsanguinated using an Esmarch, and the tourniquet was inflated to 300 mmHg. An anterior longitudinal incision was made followed by a standard mid vastus approach. The deep fibers of the medial collateral ligament were elevated in a subperiosteal fashion off of the medial flare of the tibia so as to maintain a continuous soft tissue sleeve. The patella was subluxed laterally and the patellofemoral ligament was incised. Inspection of the knee demonstrated severe degenerative changes with full-thickness loss of articular cartilage. Osteophytes were debrided using a rongeur. Anterior and posterior cruciate ligaments were excised. Two 4.0 mm Schanz pins were inserted in the femur and into the tibia for attachment of the array of trackers used for computer-assisted navigation. Hip center was identified using a circumduction technique. Distal landmarks were mapped using the computer. The distal femur and proximal tibia were mapped using the computer. The distal femoral cutting guide was positioned using computer-assisted navigation so as to achieve a 5 distal valgus cut. The femur was sized and it was felt that a size 4 femoral component was appropriate. A size 4 femoral cutting guide was positioned and the anterior cut was performed and verified using the computer. This was followed by completion of the posterior and chamfer cuts. Femoral cutting guide for the central box was then positioned in the center box cut was performed.  Attention was then directed to the proximal tibia. Medial and lateral menisci were excised. The extramedullary tibial cutting guide was positioned using computer-assisted navigation so as to achieve a 0 varus-valgus alignment and 0 posterior slope. The cut was  performed and verified  using the computer. The proximal tibia was sized and it was felt that a size 4 tibial tray was appropriate. Tibial and femoral trials were inserted followed by insertion of a 10 mm polyethylene insert. This allowed for excellent mediolateral soft tissue balancing both in flexion and in full extension. Finally, the patella was cut and prepared so as to accommodate a 38 mm 3 peg oval dome patella. A patella trial was placed and the knee was placed through a range of motion with excellent patellar tracking appreciated. The femoral trial was removed after debridement of posterior osteophytes. The central post-hole for the tibial component was reamed followed by insertion of a keel punch. Tibial trials were then removed. Cut surfaces of bone were irrigated with copious amounts of normal saline with antibiotic solution using pulsatile lavage and then suctioned dry. Polymethylmethacrylate cement was prepared in the usual fashion using a vacuum mixer. Cement was applied to the cut surface of the proximal tibia as well as along the undersurface of a size 4 MBT tibial component. Tibial component was positioned and impacted into place. Excess cement was removed using Personal assistant. Cement was then applied to the cut surfaces of the femur as well as along the posterior flanges of the size 4 femoral component. The femoral component was positioned and impacted into place. Excess cement was removed using Personal assistant. A 10 mm polyethylene trial was inserted and the knee was brought into full extension with steady axial compression applied. Finally, cement was applied to the backside of a 38 mm 3 peg oval dome patella and the patellar component was positioned and patellar clamp applied. Excess cement was removed using Personal assistant. After adequate curing of the cement, the tourniquet was deflated after a total tourniquet time of 127 minutes. Hemostasis was achieved using electrocautery. The knee was  irrigated with copious amounts of normal saline with antibiotic solution using pulsatile lavage and then suctioned dry. 20 mL of 1.3% Exparel in 40 mL of normal saline was injected along the posterior capsule, medial and lateral gutters, and along the arthrotomy site. A 1 mm stabilized rotating platform polyethylene insert was inserted and the knee was placed through a range of motion with excellent mediolateral soft tissue balancing appreciated and excellent patellar tracking noted. 2 medium drains were placed in the wound bed and brought out through separate stab incisions to be attached to a reinfusion system. The medial parapatellar portion of the incision was reapproximated using interrupted sutures of #1 Vicryl. Subcutaneous tissue was then injected with a total of 30 cc of 0.25% Marcaine with epinephrine. Subcutaneous tissue was approximated in layers using first #0 Vicryl followed #2-0 Vicryl. The skin was approximated with skin staples. A sterile dressing was applied.  The patient tolerated the procedure well and was transported to the recovery room in stable condition.    James P. Angie Fava., M.D.

## 2015-09-21 NOTE — Anesthesia Preprocedure Evaluation (Signed)
Anesthesia Evaluation  Patient identified by MRN, date of birth, ID band Patient awake    Reviewed: Allergy & Precautions, NPO status , Patient's Chart, lab work & pertinent test results  History of Anesthesia Complications Negative for: history of anesthetic complications  Airway Mallampati: II       Dental  (+) Missing   Pulmonary neg pulmonary ROS,           Cardiovascular hypertension, Pt. on medications      Neuro/Psych negative neurological ROS     GI/Hepatic negative GI ROS, Neg liver ROS,   Endo/Other  negative endocrine ROS  Renal/GU negative Renal ROS     Musculoskeletal  (+) Arthritis , Osteoarthritis,    Abdominal   Peds  Hematology  (+) anemia ,   Anesthesia Other Findings   Reproductive/Obstetrics                             Anesthesia Physical Anesthesia Plan  ASA: II  Anesthesia Plan: Spinal   Post-op Pain Management:    Induction: Intravenous  Airway Management Planned: Nasal Cannula  Additional Equipment:   Intra-op Plan:   Post-operative Plan:   Informed Consent: I have reviewed the patients History and Physical, chart, labs and discussed the procedure including the risks, benefits and alternatives for the proposed anesthesia with the patient or authorized representative who has indicated his/her understanding and acceptance.     Plan Discussed with:   Anesthesia Plan Comments:         Anesthesia Quick Evaluation

## 2015-09-21 NOTE — Transfer of Care (Signed)
Immediate Anesthesia Transfer of Care Note  Patient: Tamara Dodson  Procedure(s) Performed: Procedure(s): COMPUTER ASSISTED TOTAL KNEE ARTHROPLASTY (Right)  Patient Location: PACU  Anesthesia Type:General  Level of Consciousness: sedated  Airway & Oxygen Therapy: Patient Spontanous Breathing and Patient connected to face mask oxygen  Post-op Assessment: Report given to RN and Post -op Vital signs reviewed and stable  Post vital signs: Reviewed and stable  Last Vitals:  Filed Vitals:   09/21/15 1515  BP:   Pulse:   Temp: 36.4 C  Resp:     Complications: No apparent anesthesia complications

## 2015-09-22 LAB — CBC
HCT: 40.1 % (ref 35.0–47.0)
Hemoglobin: 13.8 g/dL (ref 12.0–16.0)
MCH: 32.1 pg (ref 26.0–34.0)
MCHC: 34.5 g/dL (ref 32.0–36.0)
MCV: 93.1 fL (ref 80.0–100.0)
PLATELETS: 192 10*3/uL (ref 150–440)
RBC: 4.31 MIL/uL (ref 3.80–5.20)
RDW: 12.9 % (ref 11.5–14.5)
WBC: 11.1 10*3/uL — ABNORMAL HIGH (ref 3.6–11.0)

## 2015-09-22 LAB — BASIC METABOLIC PANEL
Anion gap: 4 — ABNORMAL LOW (ref 5–15)
BUN: 9 mg/dL (ref 6–20)
CALCIUM: 8.5 mg/dL — AB (ref 8.9–10.3)
CO2: 30 mmol/L (ref 22–32)
CREATININE: 0.69 mg/dL (ref 0.44–1.00)
Chloride: 103 mmol/L (ref 101–111)
GFR calc Af Amer: >60 mL/min (ref >60)
GLUCOSE: 133 mg/dL — AB (ref 65–99)
POTASSIUM: 4.3 mmol/L (ref 3.5–5.1)
SODIUM: 137 mmol/L (ref 135–145)

## 2015-09-22 NOTE — Progress Notes (Signed)
Medicated for pain with relief. Encouraged to use incentive spirometer. Polar care to right knee. hemovac in place and draining. Dsg dry and intact. Foley removed this am. Received IV antibiotic and tylenol as ordered. No complaint of nausea.  TEDS and AI's as ordered. Foot of bed locked out. No acute distress noted.

## 2015-09-22 NOTE — Progress Notes (Signed)
Occupational Therapy Treatment Patient Details Name: Tamara Dodson MRN: [id number] DOB: Jan 27, 1961 Today's Date: 09/22/2015    History of present illness Patient is a pleasant 54 year old female that is s/p R TKA on 09/21/2015. She has had prior L TKA and L Hip replacement.    OT comments  This patient is a 54 year old female who came to [facility] for a R total knee replacement.  Patient lives in a 2 story home with husband and child.  She had been independent with ADL and functional mobility and works full time.  She knows how to use hip kit and most likely won't need it. Will be available if needed.    Follow Up Recommendations       Equipment Recommendations       Recommendations for Other Services      Precautions / Restrictions Precautions Precautions: Fall Restrictions Weight Bearing Restrictions: Yes RLE Weight Bearing: Weight bearing as tolerated Other Position/Activity Restrictions: Able to complete 10 SLRs and no buckling with ambulation.        Mobility Bed Mobility                  Transfers                      Balance                                  ADL                                         General ADL Comments: Patient had been independent. Patient demonstrated that she knows how to use hip kit from previous surgeries.       Vision                     Perception     Praxis      Cognition   Behavior During Therapy: WFL for tasks assessed/performed Overall Cognitive Status: Within Functional Limits for tasks assessed                       Extremity/Trunk Assessment  Upper Extremity Assessment Upper Extremity Assessment: Overall WFL for tasks assessed   Lower Extremity Assessment Lower Extremity Assessment: Overall WFL for tasks assessed   Cervical / Trunk Assessment Cervical / Trunk Assessment: Normal    Exercises   Shoulder Instructions        General Comments      Pertinent Vitals/ Pain       Pain Assessment: 0-10 Pain Score: 4  Pain Location: R Knee Pain Descriptors / Indicators: Aching Pain Intervention(s): Limited activity within patient's tolerance;Monitored during session;RN gave pain meds during session;Repositioned;Ice applied  Home Living Family/patient expects to be discharged to:: Private residence Living Arrangements: Spouse/significant other Available Help at Discharge: Family Type of Home: House       Home Layout: Two level Alternate Level Stairs-Number of Steps: 1 step in, 15 steps in the house        Bathroom Toilet: Handicapped height Bathroom Accessibility:  (Has rails )   Home Equipment: Walker - 2 wheels;Cane - single point          Prior Functioning/Environment Level of Independence: Independent  Frequency       Progress Toward Goals  OT Goals(current goals can now be found in the care plan section)     Acute Rehab OT Goals Patient Stated Goal: To return to work   Plan      Co-evaluation                 End of Session Equipment Utilized During Treatment:  (hip kit)   Activity Tolerance     Patient Left     Nurse Communication          Time: 7561-2548 OT Time Calculation (min): 10 min  Charges: OT General Charges $OT Visit: 1 Procedure OT Treatments $Self Care/Home Management : 8-22 mins  Myrene Galas, MS/OTR/L  09/22/2015, 10:38 AM

## 2015-09-22 NOTE — Progress Notes (Signed)
Pt tolerated sitting on side of bed. (dangled)

## 2015-09-22 NOTE — Evaluation (Signed)
Physical Therapy Evaluation Patient Details Name: Tamara Dodson MRN: [id number] DOB: October 23, 1961 Today's Date: 09/22/2015   History of Present Illness  Patient is a very pleasant 54 y/o female that is s/p R TKA on 09/21/2015. She has had prior L TKA and L Hip replacement.   Clinical Impression  Patient has had multiple joint replacements in the past and is familiar with the process at this point. She is supervision level to modified independent for mobility and transfers and does not seem limited by pain or anxiety whatsoever. Patient displays mildly decreased gait speed and decreased knee flexion to initiate swing phase on RLE. Patient appears below her baseline in balance and ambulation tolerance as she works and ambulates without AD. Patient progressing well towards established PT goals and will benefit from continued skilled PT to address her mobility deficits.     Follow Up Recommendations Home health PT    Equipment Recommendations       Recommendations for Other Services       Precautions / Restrictions Precautions Precautions: Fall Restrictions Weight Bearing Restrictions: Yes RLE Weight Bearing: Weight bearing as tolerated Other Position/Activity Restrictions: Able to complete 10 SLRs and no buckling with ambulation.       Mobility  Bed Mobility Overal bed mobility: Modified Independent             General bed mobility comments: No assistance or cuing required.   Transfers Overall transfer level: Needs assistance Equipment used: Rolling walker (2 wheeled) Transfers: Sit to/from Stand Sit to Stand: Supervision         General transfer comment: Patient requires cuing for hand placement, no physical assistance for standing today.   Ambulation/Gait Ambulation/Gait assistance: Supervision Ambulation Distance (Feet): 25 Feet Assistive device: Rolling walker (2 wheeled) Gait Pattern/deviations: Decreased step length - right;Decreased step length - left   Gait  velocity interpretation: Below normal speed for age/gender General Gait Details: Patient with minimal knee flexion to initiate swing phase on R LE, no loss of balance noted.   Stairs            Wheelchair Mobility    Modified Rankin (Stroke Patients Only)       Balance Overall balance assessment: Modified Independent (With RW no loss of balance.)                                           Pertinent Vitals/Pain Pain Assessment: 0-10 Pain Score: 4  Pain Location: R Knee Pain Descriptors / Indicators: Aching Pain Intervention(s): Limited activity within patient's tolerance;Monitored during session;RN gave pain meds during session;Repositioned;Ice applied    Home Living Family/patient expects to be discharged to:: Private residence Living Arrangements: Spouse/significant other Available Help at Discharge: Family Type of Home: House       Home Layout: Two level Home Equipment: Environmental consultant - 2 wheels;Cane - single point      Prior Function Level of Independence: Independent               Hand Dominance        Extremity/Trunk Assessment   Upper Extremity Assessment: Overall WFL for tasks assessed           Lower Extremity Assessment: Overall WFL for tasks assessed      Cervical / Trunk Assessment: Normal  Communication   Communication: No difficulties  Cognition Arousal/Alertness: Awake/alert Behavior During Therapy: WFL for tasks assessed/performed  Overall Cognitive Status: Within Functional Limits for tasks assessed                      General Comments General comments (skin integrity, edema, etc.): Large compression bandage in place, drains in tact.     Exercises Total Joint Exercises Ankle Circles/Pumps: AROM;Both;10 reps Heel Slides: AROM;15 reps;Both Hip ABduction/ADduction: AROM;Both;10 reps Straight Leg Raises: AROM;Both;10 reps Long Arc Quad: AROM;10 reps;Both Goniometric ROM: 9-60      Assessment/Plan     PT Assessment Patient needs continued PT services  PT Diagnosis Difficulty walking   PT Problem List Decreased strength;Decreased range of motion;Decreased activity tolerance;Decreased balance;Pain;Decreased mobility  PT Treatment Interventions Gait training;DME instruction;Therapeutic activities;Therapeutic exercise;Balance training;Stair training;Neuromuscular re-education;Manual techniques   PT Goals (Current goals can be found in the Care Plan section) Acute Rehab PT Goals Patient Stated Goal: To return to work  PT Goal Formulation: With patient Time For Goal Achievement: 10/06/15 Potential to Achieve Goals: Good    Frequency BID   Barriers to discharge Inaccessible home environment Patient can live on the first floor.     Co-evaluation               End of Session Equipment Utilized During Treatment: Gait belt Activity Tolerance: Patient tolerated treatment well Patient left: in chair;with call bell/phone within reach;with chair alarm set Nurse Communication: Mobility status         Time: 1610-9604 PT Time Calculation (min) (ACUTE ONLY): 23 min   Charges:   PT Evaluation $Initial PT Evaluation Tier I: 1 Procedure PT Treatments $Therapeutic Exercise: 8-22 mins   PT G Codes:       Kerin Ransom, PT, DPT    09/22/2015, 10:40 AM

## 2015-09-22 NOTE — Progress Notes (Signed)
Clinical Social Worker (CSW) received SNF consult. PT is recommending home health. RN Case Manager aware of above. Please reconsult if future social work needs arise. CSW signing off.   Aliyha Fornes Morgan, LCSWA [phone] 

## 2015-09-22 NOTE — Progress Notes (Signed)
Patient has tolerated regular diet well for breakfast and lunch.  IV fluids stop per protocol. Will continue to monitor the patient.

## 2015-09-22 NOTE — Progress Notes (Signed)
   Subjective: 1 Day Post-Op Procedure(s) (LRB): COMPUTER ASSISTED TOTAL KNEE ARTHROPLASTY (Right) Patient reports pain as 5 on 0-10 scale.   Patient is well, and has had no acute complaints or problems We will start therapy today.  Plan is to go Home after hospital stay. no nausea and no vomiting Patient denies any chest pains or shortness of breath. Objective: Vital signs in last 24 hours: Temp:  [97.2 F (36.2 C)-98.8 F (37.1 C)] 98.6 F (37 C) (10/04 0740) Pulse Rate:  [62-88] 75 (10/04 0740) Resp:  [14-20] 16 (10/04 0740) BP: (108-153)/(52-85) 137/68 mmHg (10/04 0740) SpO2:  [96 %-100 %] 96 % (10/04 0740) FiO2 (%):  [21 %] 21 % (10/03 1642) Weight:  [151.91 kg (334 lb 14.4 oz)] 151.91 kg (334 lb 14.4 oz) (10/03 1715) Still has original dressing in place. Heels are non tender and elevated off the bed using rolled towels Intake/Output from previous day: 10/03 0701 - 10/04 0700 In: 3548.7 [I.V.:3438.7; IV Piggyback:110] Out: 2495 [Urine:2100; Drains:245; Blood:150] Intake/Output this shift:     Recent Labs  09/22/15 0546  HGB 13.8    Recent Labs  09/22/15 0546  WBC 11.1*  RBC 4.31  HCT 40.1  PLT 192    Recent Labs  09/21/15 1707 09/22/15 0546  NA  --  137  K  --  4.3  CL  --  103  CO2  --  30  BUN  --  9  CREATININE 0.62 0.69  GLUCOSE  --  133*  CALCIUM  --  8.5*   No results for input(s): LABPT, INR in the last 72 hours.  EXAM General - Patient is Alert, Appropriate and Oriented Extremity - Neurologically intact Neurovascular intact Sensation intact distally Intact pulses distally Dorsiflexion/Plantar flexion intact Dressing - dressing C/D/I Motor Function - intact, moving foot and toes well on exam.   Past Medical History  Diagnosis Date  . Lymph edema     lower extremities  . Hypertension   . Arthritis   . Anemia     Assessment/Plan: 1 Day Post-Op Procedure(s) (LRB): COMPUTER ASSISTED TOTAL KNEE ARTHROPLASTY (Right) Active  Problems:   Total knee replacement status  Estimated body mass index is 46.73 kg/(m^2) as calculated from the following:   Height as of this encounter:  (1.803 m).   Weight as of this encounter: 151.91 kg (334 lb 14.4 oz). Advance diet Up with therapy D/C IV fluids Discharge home with home health  Labs: were reviewed DVT Prophylaxis - Lovenox, Foot Pumps and TED hose Weight-Bearing as tolerated to right leg D/C O2 and Pulse OX and try on Room Air  Tamara Dodson 09/22/2015, 7:41 AM

## 2015-09-22 NOTE — Progress Notes (Signed)
Physical Therapy Treatment Patient Details Name: Tamara Dodson MRN: [id number] DOB: 08-May-1961 Today's Date: 09/22/2015    History of Present Illness Patient is a very pleasant 54 y/o female that is s/p R TKA on 09/21/2015. She has had prior L TKA and L Hip replacement.     PT Comments    Patient making excellent progress towards goals and has now shown supervision level assistance for OOB mobility and transfers. She demonstrates improved gait pattern with cuing, with minimal limping after cuing for decreased landing force on LLE and increased use of UEs through RW. Patient demonstrates decreased ROM around RLE, though she tolerates flexion mobilizations quite well. Patient will continue to benefit from skilled PT services to continue to improve her independence with mobility.   Follow Up Recommendations  Home health PT     Equipment Recommendations       Recommendations for Other Services       Precautions / Restrictions Precautions Precautions: Fall Restrictions Weight Bearing Restrictions: Yes RLE Weight Bearing: Weight bearing as tolerated Other Position/Activity Restrictions: Able to complete 10 SLRs and no buckling with ambulation.     Mobility  Bed Mobility Overal bed mobility: Modified Independent             General bed mobility comments: Patient is able to control her RLE to bring over the edge of the bed, no balance deficits.   Transfers Overall transfer level: Needs assistance Equipment used: Rolling walker (2 wheeled) Transfers: Sit to/from Stand Sit to Stand: Supervision         General transfer comment: Patient transfers sit to stand from elevated toilet seat and from bedside chair with hands on chair and transferred to RW.   Ambulation/Gait Ambulation/Gait assistance: Supervision Ambulation Distance (Feet): 100 Feet Assistive device: Rolling walker (2 wheeled)     Gait velocity interpretation: <1.8 ft/sec, indicative of risk for recurrent  falls General Gait Details: Patient initially with antalgic gait pattern, with cuing she is able to not limp and flex her RLE to advance.    Stairs            Wheelchair Mobility    Modified Rankin (Stroke Patients Only)       Balance Overall balance assessment: Modified Independent (Supervision level for transfers with RW and mobility. )                                  Cognition Arousal/Alertness: Awake/alert Behavior During Therapy: WFL for tasks assessed/performed Overall Cognitive Status: Within Functional Limits for tasks assessed                      Exercises Total Joint Exercises Ankle Circles/Pumps: AROM;Both;10 reps Heel Slides: AAROM;Right;15 reps Straight Leg Raises: AROM;Both;10 reps Long Arc Quad: AROM;Right;10 reps Other Exercises Other Exercises: Standing heel raises x 15 with bilateral HHA  Other Exercises: Standing mini marching x 10 bilaterally to encourage RLE flexion in standing  Other Exercises: Manual P-A mobilizations with minimal overpressure in sitting to increase knee flexion ROM.     General Comments        Pertinent Vitals/Pain Pain Assessment:  (Patient provided pain medications prior to session, states she has pain but it's not severe. )    Home Living                      Prior Function  PT Goals (current goals can now be found in the care plan section) Acute Rehab PT Goals Patient Stated Goal: To return to work  PT Goal Formulation: With patient Time For Goal Achievement: 10/06/15 Potential to Achieve Goals: Good Progress towards PT goals: Progressing toward goals    Frequency  BID    PT Plan Current plan remains appropriate    Co-evaluation             End of Session Equipment Utilized During Treatment: Gait belt Activity Tolerance: Patient tolerated treatment well Patient left: in bed;with call bell/phone within reach;with bed alarm set     Time: 1478-2956 PT  Time Calculation (min) (ACUTE ONLY): 20 min  Charges:  $Gait Training: 8-22 mins                    G Codes:      Kerin Ransom, PT, DPT    09/22/2015, 4:44 PM

## 2015-09-22 NOTE — Care Management Note (Addendum)
Case Management Note  Patient Details  Name: Tamara Dodson MRN: [id number] Date of Birth: [birth year]  Subjective/Objective:                  Met with patient to discuss discharge planning. She plans to return home with her husband and supportive family. She states she would like to use [facility] like before. She uses [employer] [facility] for Rx  [phone]. She has a rolling walker, bedside commode and shower chair available at home.  Action/Plan: Lovenox 40mg  #14 called in to [place]. Referral called to [name] with [facility] for HHPT. RNCM will continue to follow.   Expected Discharge Date:                  Expected Discharge Plan:     In-House Referral:     Discharge planning Services  CM Consult  Post Acute Care Choice:  Home Health Choice offered to:  Patient  DME Arranged:  N/A DME Agency:     HH Arranged:  PT HH Agency:  [facility]  Status of Service:  In process, will continue to follow  Medicare Important Message Given:    Date Medicare IM Given:    Medicare IM give by:    Date Additional Medicare IM Given:    Additional Medicare Important Message give by:     If discussed at [place] of Stay Meetings, dates discussed:    Additional Comments: Lovenox $10.  Marshell Garfinkel, RN 09/22/2015, 10:06 AM

## 2015-09-22 NOTE — Anesthesia Postprocedure Evaluation (Signed)
  Anesthesia Post-op Note  Patient: Tamara Dodson  Procedure(s) Performed: Procedure(s): COMPUTER ASSISTED TOTAL KNEE ARTHROPLASTY (Right)  Anesthesia type:Spinal  Patient location: 146  Post pain: Pain level controlled  Post assessment: Post-op Vital signs reviewed, Patient's Cardiovascular Status Stable, Respiratory Function Stable, Patent Airway and No signs of Nausea or vomiting  Post vital signs: Reviewed and stable  Last Vitals:  Filed Vitals:   09/21/15 2346  BP: 135/68  Pulse: 88  Temp: 37.1 C  Resp: 18    Level of consciousness: awake, alert  and patient cooperative  Complications: No apparent anesthesia complications

## 2015-09-23 LAB — CBC
HCT: 39.7 % (ref 35.0–47.0)
Hemoglobin: 14.1 g/dL (ref 12.0–16.0)
MCH: 33 pg (ref 26.0–34.0)
MCHC: 35.4 g/dL (ref 32.0–36.0)
MCV: 93.1 fL (ref 80.0–100.0)
PLATELETS: 184 10*3/uL (ref 150–440)
RBC: 4.26 MIL/uL (ref 3.80–5.20)
RDW: 13.2 % (ref 11.5–14.5)
WBC: 11 10*3/uL (ref 3.6–11.0)

## 2015-09-23 MED ORDER — ENOXAPARIN SODIUM 40 MG/0.4ML ~~LOC~~ SOLN: 40.0000 mg | SUBCUTANEOUS | Status: DC

## 2015-09-23 MED ORDER — OXYCODONE HCL 5 MG PO TABS: 5.0000 mg | ORAL_TABLET | ORAL | Status: DC | PRN

## 2015-09-23 MED ORDER — TRAMADOL HCL 50 MG PO TABS: 50.0000 mg | ORAL_TABLET | ORAL | Status: DC | PRN

## 2015-09-23 NOTE — Progress Notes (Signed)
Patient has had two therapy session today- s/p right total knee, post-op day 2.  Pain medication given as needed, no complain of nausea/vomiting. Tolerated regular diet well. Discharge in am to home with home health.

## 2015-09-23 NOTE — Discharge Instructions (Signed)

## 2015-09-23 NOTE — Progress Notes (Signed)
Physical Therapy Treatment Patient Details Name: Tamara Dodson MRN: [id number] DOB: 1961-03-23 Today's Date: 09/23/2015    History of Present Illness Patient is a very pleasant 54 y/o female that is s/p R TKA on 09/21/2015. She has had prior L TKA and L Hip replacement.     PT Comments    Patient progressing quite well towards all established mobility goals and is able to ambulate around RN station with no loss of balance and appropriate gait speed. She does continue to limp initially with weight bearing on her RLE and requires cuing to maintain even level shoulders to limit this. Patient displays significantly improved sit to stand independence and tolerance/ability to perform bed level exercises in this session. Patient will benefit from stair training prior to discharge.   Follow Up Recommendations  Home health PT     Equipment Recommendations       Recommendations for Other Services       Precautions / Restrictions Precautions Precautions: Fall Restrictions Weight Bearing Restrictions: Yes RLE Weight Bearing: Weight bearing as tolerated Other Position/Activity Restrictions: Able to complete 10 SLRs and no buckling with ambulation.     Mobility  Bed Mobility               General bed mobility comments: Patient received in chair.   Transfers Overall transfer level: Needs assistance Equipment used: Rolling walker (2 wheeled) Transfers: Sit to/from Stand Sit to Stand: Supervision         General transfer comment: Patient is able to transfer sit to stand from elevated commode independently, sit to stand from bedside chair with RW with supervision.   Ambulation/Gait Ambulation/Gait assistance: Supervision Ambulation Distance (Feet): 200 Feet Assistive device: Rolling walker (2 wheeled) Gait Pattern/deviations: Step-through pattern;Antalgic   Gait velocity interpretation: Below normal speed for age/gender General Gait Details: Patient initially with slight limp  on R side, able to correct with cuing for level shoulders.    Stairs            Wheelchair Mobility    Modified Rankin (Stroke Patients Only)       Balance Overall balance assessment:  (With RW)                                  Cognition Arousal/Alertness: Awake/alert Behavior During Therapy: WFL for tasks assessed/performed Overall Cognitive Status: Within Functional Limits for tasks assessed                      Exercises Total Joint Exercises Heel Slides: AAROM;AROM;15 reps;Both Straight Leg Raises: AROM;15 reps;Both Long Arc Quad: AROM;Both;15 reps Goniometric ROM: 8-75    General Comments General comments (skin integrity, edema, etc.): Modified DGI of 11/12 with RW      Pertinent Vitals/Pain Pain Score: 6  Pain Location: R knee  Pain Descriptors / Indicators: Aching Pain Intervention(s): Limited activity within patient's tolerance;Monitored during session;RN gave pain meds during session;Utilized relaxation techniques;Repositioned;Ice applied    Home Living                      Prior Function            PT Goals (current goals can now be found in the care plan section) Acute Rehab PT Goals Patient Stated Goal: To return to work  PT Goal Formulation: With patient Time For Goal Achievement: 10/06/15 Potential to Achieve Goals: Good Progress towards PT  goals: Progressing toward goals    Frequency  BID    PT Plan Current plan remains appropriate    Co-evaluation             End of Session Equipment Utilized During Treatment: Gait belt Activity Tolerance: Patient tolerated treatment well Patient left: in chair;with call bell/phone within reach;with chair alarm set     Time: 0981-1914 PT Time Calculation (min) (ACUTE ONLY): 23 min  Charges:  $Gait Training: 8-22 mins $Therapeutic Exercise: 8-22 mins                    G Codes:      Kerin Ransom, PT, DPT    09/23/2015, 1:42 PM

## 2015-09-23 NOTE — Progress Notes (Signed)
   Subjective: 2 Days Post-Op Procedure(s) (LRB): COMPUTER ASSISTED TOTAL KNEE ARTHROPLASTY (Right) Patient reports pain as 6 on 0-10 scale.   Patient is well, and has had no acute complaints or problems Continue with physical  therapy today.  Plan is to go Home after hospital stay. no nausea and no vomiting Patient denies any chest pains or shortness of breath. Has had a bowel movement Objective: Vital signs in last 24 hours: Temp:  [98.2 F (36.8 C)-98.6 F (37 C)] 98.2 F (36.8 C) (10/05 0348) Pulse Rate:  [75-102] 102 (10/05 0348) Resp:  [14-20] 18 (10/05 0348) BP: (131-166)/(63-74) 149/72 mmHg (10/05 0348) SpO2:  [94 %-98 %] 97 % (10/05 0348) well approximated incision Heels are non tender and elevated off the bed using rolled towels Intake/Output from previous day: 10/04 0701 - 10/05 0700 In: 1835.7 [P.O.:1200; I.V.:635.7] Out: 100 [Urine:100] Intake/Output this shift:     Recent Labs  09/22/15 0546 09/23/15 0553  HGB 13.8 14.1    Recent Labs  09/22/15 0546 09/23/15 0553  WBC 11.1* 11.0  RBC 4.31 4.26  HCT 40.1 39.7  PLT 192 184    Recent Labs  09/21/15 1707 09/22/15 0546  NA  --  137  K  --  4.3  CL  --  103  CO2  --  30  BUN  --  9  CREATININE 0.62 0.69  GLUCOSE  --  133*  CALCIUM  --  8.5*   No results for input(s): LABPT, INR in the last 72 hours.  EXAM General - Patient is Alert, Appropriate and Oriented Extremity - Neurologically intact Neurovascular intact Sensation intact distally Intact pulses distally Dorsiflexion/Plantar flexion intact Dressing - dressing C/D/I Motor Function - intact, moving foot and toes well on exam.    Past Medical History  Diagnosis Date  . Lymph edema     lower extremities  . Hypertension   . Arthritis   . Anemia     Assessment/Plan: 2 Days Post-Op Procedure(s) (LRB): COMPUTER ASSISTED TOTAL KNEE ARTHROPLASTY (Right) Active Problems:   Total knee replacement status  Estimated body mass  index is 46.73 kg/(m^2) as calculated from the following:   Height as of this encounter:  (1.803 m).   Weight as of this encounter: 151.91 kg (334 lb 14.4 oz). Up with therapy Plan for discharge tomorrow Discharge home with home health  Labs: reviewed DVT Prophylaxis - Lovenox, Foot Pumps and TED hose Weight-Bearing as tolerated to right leg Hemovac d/c'd  Lynnda Shields. [facility] 09/23/2015, 6:52 AM

## 2015-09-23 NOTE — Progress Notes (Signed)
Physical Therapy Treatment Patient Details Name: Tamara Dodson MRN: [id number] DOB: 1961-02-13 Today's Date: 09/23/2015    History of Present Illness Patient is a very pleasant 54 y/o female that is s/p R TKA on 09/21/2015. She has had prior L TKA and L Hip replacement.     PT Comments    Patient has made excellent progress with all mobility and is now independent or modified independent with all mobility and is able to ascend/descend the step into her home. She displays no loss of balance, though mild drifting laterally in ambulation. She remains somewhat antalgic with gait pattern, though is able to correct this with cuing. She is tolerating higher level standing exercises and steps without increased discomfort or loss of balance. Patient has met all discharge criteria to return home from PT perspective. PT will continue to benefit this patient to improve her independence with mobility.   Follow Up Recommendations  Home health PT     Equipment Recommendations       Recommendations for Other Services       Precautions / Restrictions Precautions Precautions: Fall Restrictions Weight Bearing Restrictions: Yes RLE Weight Bearing: Weight bearing as tolerated Other Position/Activity Restrictions: Able to complete 10 SLRs and no buckling with ambulation.     Mobility  Bed Mobility Overal bed mobility: Independent             General bed mobility comments: Patient able to transfer sit to supine independently.   Transfers                 General transfer comment: Patient able to sit mod I with RW. Received in standing after using restroom.   Ambulation/Gait Ambulation/Gait assistance: Supervision Ambulation Distance (Feet): 275 Feet Assistive device: Rolling walker (2 wheeled) Gait Pattern/deviations: Step-through pattern;Wide base of support;Antalgic   Gait velocity interpretation: Below normal speed for age/gender General Gait Details: Patient initially with  slight limp on R side, able to correct with cuing for level shoulders. Somewhat erratic with drifting left/right, no loss of balance as a result.    Stairs Stairs: Yes Stairs assistance: Supervision Stair Management: No rails Number of Stairs: 1 General stair comments: Patient has a stoop to ascend to get in the house, she is able to perform such with RW and cuing for appropriate sequencing with no physical assistance.   Wheelchair Mobility    Modified Rankin (Stroke Patients Only)       Balance Overall balance assessment: Modified Independent (with RW )                                  Cognition Arousal/Alertness: Awake/alert Behavior During Therapy: WFL for tasks assessed/performed Overall Cognitive Status: Within Functional Limits for tasks assessed                      Exercises Other Exercises Other Exercises: Standing heel raises x 15 with bilateral HHA  Other Exercises: Standing mini marching x 10 bilaterally to encourage RLE flexion in standing     General Comments        Pertinent Vitals/Pain Pain Assessment:  (Patient does not rate pain, she displays mildly antalgic gait pattern which she is able to correct with cuing. ) Pain Intervention(s): Limited activity within patient's tolerance;Monitored during session;Premedicated before session;Utilized relaxation techniques;Ice applied;Repositioned    Home Living  Prior Function            PT Goals (current goals can now be found in the care plan section) Acute Rehab PT Goals Patient Stated Goal: To return to work  PT Goal Formulation: With patient Time For Goal Achievement: 10/06/15 Potential to Achieve Goals: Good Progress towards PT goals: Progressing toward goals    Frequency  BID    PT Plan Current plan remains appropriate    Co-evaluation             End of Session Equipment Utilized During Treatment: Gait belt Activity Tolerance: Patient  tolerated treatment well Patient left: in bed;with bed alarm set;with call bell/phone within reach     Time: 1444-1458 PT Time Calculation (min) (ACUTE ONLY): 14 min  Charges:  $Gait Training: 8-22 mins                    G Codes:      Kerman Passey, PT, DPT    09/23/2015, 4:21 PM

## 2015-09-24 LAB — CBC
HEMATOCRIT: 39.5 % (ref 35.0–47.0)
Hemoglobin: 13.8 g/dL (ref 12.0–16.0)
MCH: 32.6 pg (ref 26.0–34.0)
MCHC: 34.9 g/dL (ref 32.0–36.0)
MCV: 93.2 fL (ref 80.0–100.0)
PLATELETS: 194 10*3/uL (ref 150–440)
RBC: 4.24 MIL/uL (ref 3.80–5.20)
RDW: 13 % (ref 11.5–14.5)
WBC: 10.4 10*3/uL (ref 3.6–11.0)

## 2015-09-24 NOTE — Progress Notes (Signed)
   Subjective: 3 Days Post-Op Procedure(s) (LRB): COMPUTER ASSISTED TOTAL KNEE ARTHROPLASTY (Right) Patient reports pain as 2 on 0-10 scale.   Patient is well, and has had no acute complaints or problems Continue with physical therapy today.  Plan is to go Home after hospital stay. no nausea and no vomiting Patient denies any chest pains or shortness of breath. Objective: Vital signs in last 24 hours: Temp:  [98.5 F (36.9 C)-98.9 F (37.2 C)] 98.7 F (37.1 C) (10/06 0333) Pulse Rate:  [96-106] 102 (10/06 0333) Resp:  [13-18] 18 (10/06 0333) BP: (144-167)/(69-89) 144/69 mmHg (10/06 0333) SpO2:  [98 %-99 %] 99 % (10/06 0333) well approximated incision Heels are non tender and elevated off the bed using rolled towels Intake/Output from previous day: 10/05 0701 - 10/06 0700 In: 480 [P.O.:480] Out: -  Intake/Output this shift:     Recent Labs  09/22/15 0546 09/23/15 0553 09/24/15 0440  HGB 13.8 14.1 13.8    Recent Labs  09/23/15 0553 09/24/15 0440  WBC 11.0 10.4  RBC 4.26 4.24  HCT 39.7 39.5  PLT 184 194    Recent Labs  09/21/15 1707 09/22/15 0546  NA  --  137  K  --  4.3  CL  --  103  CO2  --  30  BUN  --  9  CREATININE 0.62 0.69  GLUCOSE  --  133*  CALCIUM  --  8.5*   No results for input(s): LABPT, INR in the last 72 hours.  EXAM General - Patient is Alert, Appropriate and Oriented Extremity - Neurologically intact Neurovascular intact Sensation intact distally Intact pulses distally Dorsiflexion/Plantar flexion intact Dressing - dressing C/D/I Motor Function - intact, moving foot and toes well on exam.   Past Medical History  Diagnosis Date  . Lymph edema     lower extremities  . Hypertension   . Arthritis   . Anemia     Assessment/Plan: 3 Days Post-Op Procedure(s) (LRB): COMPUTER ASSISTED TOTAL KNEE ARTHROPLASTY (Right) Active Problems:   Total knee replacement status  Estimated body mass index is 46.73 kg/(m^2) as calculated  from the following:   Height as of this encounter:  (1.803 m).   Weight as of this encounter: 151.91 kg (334 lb 14.4 oz). Up with therapy Discharge home with home health today  Labs: none DVT Prophylaxis - Lovenox, Foot Pumps and TED hose Weight-Bearing as tolerated to right leg dsg changed today.  Lynnda Shields. [facility] 09/24/2015, 7:23 AM

## 2015-09-24 NOTE — Care Management (Signed)
Patient discharging home today. I have notified [name] with [name] of patient discharge. No further RNCM needs. Case closed.

## 2015-09-24 NOTE — Progress Notes (Signed)
Physical Therapy Treatment Patient Details Name: RAVYN NIKKEL MRN: [id number] DOB: Aug 27, 1961 Today's Date: 09/24/2015    History of Present Illness Patient is a very pleasant 54 y/o female that is s/p R TKA on 09/21/2015. She has had prior L TKA and L Hip replacement.     PT Comments    Patient has now met all acute level PT related goals and demonstrates very minimal balance deficits with RW. Patient is able to complete all mobility required to return home safely during this session. She continues to demonstrate antalgic gait initially, though is able to correct with cuing. Patient will continue to benefit from skilled PT services to address her balance and strength deficits.   Follow Up Recommendations  Home health PT     Equipment Recommendations       Recommendations for Other Services       Precautions / Restrictions Precautions Precautions: Fall Restrictions Weight Bearing Restrictions: Yes RLE Weight Bearing: Weight bearing as tolerated Other Position/Activity Restrictions: Able to complete 10 SLRs and no buckling with ambulation.     Mobility  Bed Mobility Overal bed mobility: Independent             General bed mobility comments: Patient able to transfer sit to supine independently.   Transfers Overall transfer level: Modified independent Equipment used: Rolling walker (2 wheeled) Transfers: Sit to/from Stand Sit to Stand: Modified independent (Device/Increase time)         General transfer comment: Patient is now able to transfer independently.   Ambulation/Gait Ambulation/Gait assistance: Modified independent (Device/Increase time) Ambulation Distance (Feet): 275 Feet Assistive device: Rolling walker (2 wheeled) Gait Pattern/deviations: Step-through pattern;Wide base of support;Antalgic   Gait velocity interpretation: Below normal speed for age/gender General Gait Details: Patient continues to have antalgic gait initially and dirfts to the L side  with RW. She is able to correct with cuing for even shoulders and softer landing on LLE.    Stairs   Stairs assistance: Modified independent (Device/Increase time) Stair Management: With walker Number of Stairs: 1 General stair comments: Patient has a stoop to ascend to get in the house, she is able to perform such with RW and cuing for appropriate sequencing with no physical assistance.   Wheelchair Mobility    Modified Rankin (Stroke Patients Only)       Balance Overall balance assessment: Modified Independent                                  Cognition Arousal/Alertness: Awake/alert Behavior During Therapy: WFL for tasks assessed/performed Overall Cognitive Status: Within Functional Limits for tasks assessed                      Exercises Total Joint Exercises Goniometric ROM: 3-75    General Comments General comments (skin integrity, edema, etc.): No balance deficits in standing, sitting, or ambulation noted.       Pertinent Vitals/Pain Pain Assessment:  (Patient continues to have antalgic gait initially, she is able to self correct with cuing) Pain Intervention(s): Limited activity within patient's tolerance;Monitored during session;Premedicated before session;Ice applied;Repositioned    Home Living                      Prior Function            PT Goals (current goals can now be found in the care plan section) Acute Rehab PT  Goals Patient Stated Goal: To return to work  PT Goal Formulation: With patient Time For Goal Achievement: 10/06/15 Potential to Achieve Goals: Good Progress towards PT goals: Progressing toward goals    Frequency  BID    PT Plan Current plan remains appropriate    Co-evaluation             End of Session Equipment Utilized During Treatment: Gait belt Activity Tolerance: Patient tolerated treatment well Patient left: in bed;with call bell/phone within reach     Time: 0913-0930 PT Time  Calculation (min) (ACUTE ONLY): 17 min  Charges:  $Gait Training: 8-22 mins                    G Codes:      Kerman Passey, PT, DPT    09/24/2015, 10:15 AM

## 2015-09-24 NOTE — Discharge Summary (Signed)
Physician Discharge Summary  Patient ID: Tamara Dodson MRN: [id number] DOB/AGE: 07/30/1961 54 y.o.  Admit date: 09/21/2015 Discharge date: 09/24/2015  Admission Diagnoses:  osteoarthritis   Discharge Diagnoses: Patient Active Problem List   Diagnosis Date Noted  . Total knee replacement status 09/21/2015    Past Medical History  Diagnosis Date  . Lymph edema     lower extremities  . Hypertension   . Arthritis   . Anemia      Transfusion: autovac transfusion was given the first 6 hours following surgery   Consultants (if any):  case management  Discharged Condition: Improved  Hospital Course: Tamara Dodson is an 54 y.o. female who was admitted 09/21/2015 with a diagnosis of degenerative arthrosis of right knee and went to the operating room on 09/21/2015 and underwent the above named procedures.    Surgeries:Procedure(s): COMPUTER ASSISTED TOTAL KNEE ARTHROPLASTY on 09/21/2015  PROCEDURE: Right total knee arthroplasty using computer-assisted navigation  SURGEON: Jena Gauss. M.D.  ASSISTANT: Van Clines, PA (present and scrubbed throughout the case, critical for assistance with exposure, retraction, instrumentation, and closure)  ANESTHESIA: spinal  ESTIMATED BLOOD LOSS: 150 mL  FLUIDS REPLACED: 2000 mL of crystalloid  TOURNIQUET TIME: 127 minutes  DRAINS: 2 medium drains to a reinfusion system  SOFT TISSUE RELEASES: Anterior cruciate ligament, posterior cruciate ligament, deep medial collateral ligament, patellofemoral ligament   IMPLANTS UTILIZED: DePuy PFC Sigma size 4 posterior stabilized femoral component (cemented), size 4 MBT tibial component (cemented), 38 mm 3 peg oval dome patella (cemented), and a 10 mm stabilized rotating platform polyethylene insert.  INDICATIONS FOR SURGERY: Tamara Dodson is a 54 y.o. year old female with a long history of progressive knee pain. X-rays demonstrated severe degenerative changes in tricompartmental fashion. The  patient had not seen any significant improvement despite conservative nonsurgical intervention. After discussion of the risks and benefits of surgical intervention, the patient expressed understanding of the risks benefits and agree with plans for total knee arthroplasty.   The risks, benefits, and alternatives were discussed at length including but not limited to the risks of infection, bleeding, nerve injury, stiffness, blood clots, the need for revision surgery, cardiopulmonary complications, among others, and they were willing to proceed. Patient tolerated the surgery well. No complications .Patient was taken to PACU where she was stabilized and then transferred to the orthopedic floor.  Patient started on Lovenox 40 q 12 hrs. Foot pumps applied bilaterally at 80 mm hg. Heels elevated off bed with rolled towels. No evidence of DVT. Calves non tender. Negative Homan. Physical therapy started on day #1 for gait training and transfer with OT starting on  day #1 for ADL and assisted devices. Patient has done well with therapy. Ambulated greater than 200 feet upon being discharged. Able to go up 4 steps safely.  Patient's IV , foley discontinued on day #1 and hemovac was d/c on day #2.   She was given perioperative antibiotics:  Anti-infectives    Start     Dose/Rate Route Frequency Ordered Stop   09/21/15 1730  ceFAZolin (ANCEF) 3 g in dextrose 5 % 50 mL IVPB     3 g 160 mL/hr over 30 Minutes Intravenous Every 6 hours 09/21/15 1641 09/22/15 1125   09/21/15 0315  ceFAZolin (ANCEF) 3 g in dextrose 5 % 50 mL IVPB     3 g 160 mL/hr over 30 Minutes Intravenous  Once 09/21/15 0310 09/21/15 1136    .  She was fitted with AV1  compression foot pumps devices, early ambulation, and TED stocking and lovenox for DVT prophylaxis.  She benefited maximally from the hospital stay and there were no complications.    Recent vital signs:  Filed Vitals:   09/24/15 0333  BP: 144/69  Pulse: 102  Temp: 98.7  F (37.1 C)  Resp: 18    Recent laboratory studies:  Lab Results  Component Value Date   HGB 13.8 09/24/2015   HGB 14.1 09/23/2015   HGB 13.8 09/22/2015   Lab Results  Component Value Date   WBC 10.4 09/24/2015   PLT 194 09/24/2015   Lab Results  Component Value Date   INR 1.00 09/03/2015   Lab Results  Component Value Date   NA 137 09/22/2015   K 4.3 09/22/2015   CL 103 09/22/2015   CO2 30 09/22/2015   BUN 9 09/22/2015   CREATININE 0.69 09/22/2015   GLUCOSE 133* 09/22/2015    Discharge Medications:     Medication List    STOP taking these medications        aspirin EC 81 MG tablet      TAKE these medications        amLODipine 5 MG tablet  Commonly known as:  NORVASC  Take 5 mg by mouth every morning.     dextromethorphan-guaiFENesin 30-600 MG 12hr tablet  Commonly known as:  MUCINEX DM  Take 1 tablet by mouth 2 (two) times daily as needed for cough.     Echinacea 500 MG Caps  Take 1,520 mg by mouth.     enoxaparin 40 MG/0.4ML injection  Commonly known as:  LOVENOX  Inject 0.4 mLs (40 mg total) into the skin daily.     ferrous sulfate 325 (65 FE) MG EC tablet  Take 325 mg by mouth 3 (three) times daily with meals.     Fish Oil 1200 MG Cpdr  Take 1,200 mg by mouth every morning.     furosemide 20 MG tablet  Commonly known as:  LASIX  Take 20 mg by mouth every morning.     multivitamin with minerals tablet  Take 1 tablet by mouth daily.     oxyCODONE 5 MG immediate release tablet  Commonly known as:  Oxy IR/ROXICODONE  Take 1-2 tablets (5-10 mg total) by mouth every 4 (four) hours as needed for severe pain or breakthrough pain.     traMADol 50 MG tablet  Commonly known as:  ULTRAM  Take 1-2 tablets (50-100 mg total) by mouth every 4 (four) hours as needed for moderate pain.     vitamin C 1000 MG tablet  Take 1,000 mg by mouth daily.        Diagnostic Studies: Dg Knee Right Port  09/21/2015   CLINICAL DATA:  Status post total knee  replacement  EXAM: PORTABLE RIGHT KNEE - 1-2 VIEW  COMPARISON:  None.  FINDINGS: Patient is status post total knee replacement. Hardware appears well-positioned. No surgical complicating feature identified. Overlying surgical staples in place.  IMPRESSION: Expected postsurgical changes status post total knee replacement. No surgical complicating feature.   Electronically Signed   By: Bary Richard M.D.   On: 09/21/2015 15:55    Disposition:       Discharge Instructions    Diet - low sodium heart healthy    Complete by:  As directed      Diet - low sodium heart healthy    Complete by:  As directed      Increase activity slowly  Complete by:  As directed      Increase activity slowly    Complete by:  As directed            Follow-up Information    Follow up with Patience Musca, PA-C On 10/06/2015.   Specialties:  Orthopedic Surgery, Emergency Medicine   Why:  at 10:15am   Contact information:   [address] [place] [address] [phone]       Follow up with Donato Heinz, MD On 11/03/2015.   Specialty:  Orthopedic Surgery   Why:  at 9:45am   Contact information:   [address] [place] [address] [phone]        Signed: Tera Partridge 09/24/2015, 7:35 AM

## 2016-08-10 ENCOUNTER — Other Ambulatory Visit: Payer: Self-pay | Admitting: Family Medicine

## 2016-08-10 DIAGNOSIS — Z1231 Encounter for screening mammogram for malignant neoplasm of breast: Secondary | ICD-10-CM

## 2016-08-23 ENCOUNTER — Ambulatory Visit
Admission: RE | Admit: 2016-08-23 | Discharge: 2016-08-23 | Disposition: A | Discharge status: Home / Self Care | Payer: BLUE CROSS/BLUE SHIELD | Source: Ambulatory Visit | Attending: Family Medicine | Admitting: Family Medicine

## 2016-08-23 DIAGNOSIS — Z1231 Encounter for screening mammogram for malignant neoplasm of breast: Secondary | ICD-10-CM | POA: Diagnosis not present

## 2016-11-21 ENCOUNTER — Other Ambulatory Visit: Payer: Self-pay | Admitting: Student

## 2016-11-21 DIAGNOSIS — R945 Abnormal results of liver function studies: Principal | ICD-10-CM

## 2016-11-21 DIAGNOSIS — R7989 Other specified abnormal findings of blood chemistry: Secondary | ICD-10-CM

## 2016-12-21 ENCOUNTER — Ambulatory Visit
Admission: RE | Admit: 2016-12-21 | Discharge: 2016-12-21 | Disposition: A | Discharge status: Home / Self Care | Payer: BLUE CROSS/BLUE SHIELD | Source: Ambulatory Visit | Attending: Student | Admitting: Student

## 2016-12-21 ENCOUNTER — Ambulatory Visit: Payer: BLUE CROSS/BLUE SHIELD | Attending: Family Medicine | Admitting: Occupational Therapy

## 2016-12-21 DIAGNOSIS — Z9049 Acquired absence of other specified parts of digestive tract: Secondary | ICD-10-CM | POA: Insufficient documentation

## 2016-12-21 DIAGNOSIS — R7989 Other specified abnormal findings of blood chemistry: Secondary | ICD-10-CM | POA: Diagnosis not present

## 2016-12-21 DIAGNOSIS — I890 Lymphedema, not elsewhere classified: Secondary | ICD-10-CM | POA: Insufficient documentation

## 2016-12-21 DIAGNOSIS — R945 Abnormal results of liver function studies: Secondary | ICD-10-CM

## 2016-12-21 NOTE — Patient Instructions (Signed)

## 2016-12-23 ENCOUNTER — Encounter: Payer: BLUE CROSS/BLUE SHIELD | Admitting: Occupational Therapy

## 2016-12-23 ENCOUNTER — Ambulatory Visit: Payer: BLUE CROSS/BLUE SHIELD | Admitting: Occupational Therapy

## 2016-12-23 DIAGNOSIS — I890 Lymphedema, not elsewhere classified: Secondary | ICD-10-CM

## 2016-12-26 ENCOUNTER — Ambulatory Visit: Payer: BLUE CROSS/BLUE SHIELD | Admitting: Occupational Therapy

## 2016-12-26 ENCOUNTER — Encounter: Payer: Self-pay | Admitting: Occupational Therapy

## 2016-12-26 DIAGNOSIS — I890 Lymphedema, not elsewhere classified: Secondary | ICD-10-CM

## 2016-12-26 NOTE — Patient Instructions (Signed)
LE instructions and precautions as established- see initial eval.   

## 2016-12-26 NOTE — Therapy (Signed)
[facility] MAIN [facility] SERVICES [address] [place], [place], [address] Phone: [phone]   Fax:  [phone]  Occupational Therapy Treatment  Patient Details  Name: Tamara Dodson MRN: [id number] Date of Birth: 1961/10/23 Referring Provider: [name], MD  Encounter Date: 12/26/2016      OT End of Session - 12/26/16 1640    Visit Number 3   Number of Visits 36   Date for OT Re-Evaluation 03/21/17   OT Start Time 0300   OT Stop Time 0415   OT Time Calculation (min) 75 min      Past Medical History:  Diagnosis Date  . Anemia   . Arthritis   . Hypertension   . Lymph edema    lower extremities    Past Surgical History:  Procedure Laterality Date  . CHOLECYSTECTOMY    . JOINT REPLACEMENT Left    knee, hip  . KNEE ARTHROPLASTY Right 09/21/2015   Procedure: COMPUTER ASSISTED TOTAL KNEE ARTHROPLASTY;  Surgeon: Donato Heinz, MD;  Location: [facility] ORS;  Service: Orthopedics;  Laterality: Right;  . TONSILLECTOMY      There were no vitals filed for this visit.      Subjective Assessment - 12/26/16 1636    Subjective  Pt presents for OT visit 3 to address BLE LE. Pt is unaccompanied today. Pt reports mild difficulty keeping bandages up after last session.    Pertinent History BLE leg swelling (A-G) and associated pain/ discomfort w, onset young adulthood without known precipitating event; HTN, OA, reported B foot and distal leg numbness; chronic back pain; chronic headaches- reported; L hip arthroplasty 2012; L knee replacement 2014; R hip arthroplasty 2016   Limitations BLE pain and swelling, L>R difficulty walking, decreased standing tolerance, difficulty fitting street shoes and LB clothing; lack of BLE sensation contributes to falls risk; chhronic back and joint pain 2/2 OA;    Patient Stated Goals reduce swelling and pain and keep it from getting worse   Currently in Pain? 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 Dependent positioning, inflamation 2/2 OA, and frequent surgical trauma due to BLE arthroplasty x 3 contibute to exacerbation.    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 LE precautions,     Balance Screen   Has the patient fallen in the past 6 months No     Home  Environment   Lives With Family;Spouse     Prior Function   Level of Independence Independent with basic ADLs;Independent with household mobility without device;Independent with community mobility without device;Independent with homemaking with ambulation;Independent with gait;Independent with transfers   Vocation Full time employment   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  Below knee to groin limb volume = 12636.27 ml.  Ankle to groin (A-G) limb  volume = 22923.45 ml.     Left Lower Extremity Lymphedema   Other LLE below-the-knee (A-D) limb volume = 10432.15 ml.  Below knee to groin limb volume = 14836.12 ml.  Ankle to groin (A-G) limb volume = 25265.27 ml.   Other LLE A-D limb volume measures 1.4% > than RLE A-D volume. LLE knee to groin measures 14.81% > than RLE at same landmarks. LLE A-G volume   measures 9.27% greater than full RLE                 OT Treatments/Exercises (OP) - 12/26/16 1638      Manual Therapy   Manual Therapy Edema management;Manual Lymphatic Drainage (MLD);Compression Bandaging;Other  (comment)   Manual therapy comments --   Manual Lymphatic Drainage (MLD) LLE gradient compression wraps applied circumferentially in gradient configuration from toes to groin as follows: custom toe wrap using Transelast Classic folded in half under cotton stockinett applied from toes to groin; 8 cm x 5 m x 1 short stretch wrap to foot and ankle, 10 cm x 5 m x 1, then 12 cm x 5- all layered over .04 x 10 cm,12 cm and 15 cm Rosidol Soft foam from A-G.                OT Education - 12/26/16 1639    Education provided Yes   Education Details Emphasis of skilled education on LE self care for compression wrapping from  toes to groin.   Person(s) Educated Patient   Methods Explanation;Demonstration;Tactile cues;Verbal cues;Handout   Comprehension Verbalized understanding;Returned demonstration;Verbal cues required;Tactile cues required;Need further instruction             OT Long Term Goals - 12/26/16 1450      OT LONG TERM GOAL #1   Title Pt independent w/ lymphedema precautions and prevention  principals and strategies to limit LE progression and infection risk.   Baseline dependent   Time 2   Period Weeks   Status New     OT LONG TERM GOAL #2   Title Lymphedema (LE) management/ self-care: Pt able to apply multi layered, gradient compression wraps w Max caregiver assistance using proper techniques within 2 weeks to achieve optimal limb volume reduction.   Baseline dependent   Time 12   Period Weeks   Status New     OT LONG TERM GOAL #3   Title Lymphedema (LE) management/ self-care:  Pt to achieve at least 10% limb volume reductions bilaterally during Intensive CDT to limit LE progression, to reduce pain, and to improve safe ambulation and functional mobility.   Baseline dependent   Time 12   Period Weeks   Status New     OT LONG TERM GOAL #4   Title Lymphedema (LE) management/ self-care:  Pt to tolerate daily compression wraps, garments and devices in keeping w/  prescribed wear regime within 1 week of issue date to progress and retain clinical and functional gains and to limit LE progression.   Baseline dependent   Time 12   Period Weeks   Status New     OT LONG TERM GOAL #5   Title Lymphedema (LE) management/ self-care:  During Management Phase CDT Pt to sustain current limb volumes within 5%, and all other clinical gains achieved during OT treatment with needed level of caregiver assistance to limit LE progression, infection risk and further functional decline.   Baseline dependent   Time 6   Period Months   Status On-going  Plan - 12/26/16 1640    Clinical Impression Statement Pt able to tolerate compression wraps nearly 24 hours after applying them initially after last session, despite them falling down from top edge. LLE appears and is palpably decreased in volume and congestion today compared to last visit. After skilled teaching, pt able to apply multi layer gradient compression wraps from toes to groin with max assistance using very good technique. Cont as per POC. Start MLD next session.   OT Frequency 3x / week   OT Duration 12 weeks   OT Treatment/Interventions Self-care/ADL training;Therapeutic exercise;Patient/family education;Manual Therapy;Manual lymph drainage;Other (comment);DME and/or AE instruction;Compression bandaging;Therapeutic activities  skin care w/ low pH lotion and castor oil   Consulted and Agree with Plan of Care Patient      Patient will benefit from skilled therapeutic intervention in order to improve the following deficits and impairments:  Decreased skin integrity, Decreased knowledge of precautions, Decreased activity tolerance, Decreased knowledge of use of DME, Impaired flexibility, Decreased balance, Decreased mobility, Difficulty walking, Impaired sensation, Obesity, Decreased range of motion, Increased edema, Pain  Visit Diagnosis: Lymphedema, not elsewhere classified    Problem  List Patient Active Problem List   Diagnosis Date Noted  . Total knee replacement status 09/21/2015   Loel Dubonnetheresa Gilliam, MS, OTR/L, [facility] 12/26/16 4:44 PM  [place] [facility] [address] [place], [place], [address] Phone: [phone]   Fax:  [phone]  Name: Tamara Dodson MRN: [id number] Date of Birth: 08/23/1961

## 2016-12-26 NOTE — Therapy (Signed)
[facility] MAIN [facility] [address] [place], [place], [address] Phone: [phone]   Fax:  [phone]  Occupational Therapy Evaluation  Patient Details  Name: Tamara Dodson MRN: [id number] Date of Birth: 02/04/1961 Referring Provider: Jerl MinaJames Hedrick, MD  Encounter Date: 12/21/2016    Past Medical History:  Diagnosis Date  . Anemia   . Arthritis   . Hypertension   . Lymph edema    lower extremities    Past Surgical History:  Procedure Laterality Date  . CHOLECYSTECTOMY    . JOINT REPLACEMENT Left    knee, hip  . KNEE ARTHROPLASTY Right 09/21/2015   Procedure: COMPUTER ASSISTED TOTAL KNEE ARTHROPLASTY;  Surgeon: Donato HeinzJames P Hooten, MD;  Location: [facility] ORS;  Service: Orthopedics;  Laterality: Right;  . 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 Dependent positioning, inflamation 2/2 OA, and frequent surgical trauma due to BLE arthroplasty x 3 contibute to exacerbation.    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 LE precautions,     Balance Screen   Has the patient fallen in the past 6 months No     Home  Environment   Lives With Family;Spouse     Prior Function   Level of Independence Independent with basic ADLs;Independent with household mobility without device;Independent with community mobility without device;Independent with homemaking with ambulation;Independent with gait;Independent with transfers   Vocation Full time employment   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          LYMPHEDEMA/ONCOLOGY QUESTIONNAIRE - 12/26/16 0902      What other symptoms do you have   Are you Having Heaviness or Tightness Yes   Are you having Pain Yes   Are you having pitting edema No   Is it Hard or Difficult finding clothes that fit Yes   Do you have infections No   Is there Decreased scar mobility No   Stemmer Sign Yes     Lymphedema Assessments   Lymphedema Assessments Lower extremities     Right Lower Extremity Lymphedema   Other BLE comparative limb volumetrics TBA at firsvisitt OT rx                 OT Treatments/Exercises (OP) - 12/26/16 0001      ADLs   LB Dressing difficuly fitting street shoes and LB clothing 2/2 swelling   Work sedentary job contributes to LE swelling    ADL Comments decreased body image 2/2 dysmorphic limb swelling     Manual Therapy   Manual Therapy Edema management                    OT Long Term Goals - 12/26/16 1450      OT LONG TERM GOAL #1   Title Pt independent w/ lymphedema precautions and prevention  principals and strategies to limit LE progression and infection risk.  Baseline dependent   Time 2   Period Weeks   Status New     OT LONG TERM GOAL #2   Title Lymphedema (LE) management/ self-care: Pt able to apply multi layered, gradient compression wraps w Max caregiver assistance using proper techniques within 2 weeks to achieve optimal limb volume reduction.   Baseline dependent   Time 12   Period Weeks   Status New     OT LONG TERM GOAL #3   Title Lymphedema (LE) management/ self-care:  Pt to achieve at least 10% limb volume reductions bilaterally during Intensive CDT to limit LE progression, to reduce pain, and to improve safe ambulation and functional mobility.   Baseline dependent   Time 12   Period Weeks   Status New     OT LONG TERM GOAL #4   Title Lymphedema (LE) management/ self-care:  Pt to tolerate daily  compression wraps, garments and devices in keeping w/ prescribed wear regime within 1 week of issue date to progress and retain clinical and functional gains and to limit LE progression.   Baseline dependent   Time 12   Period Weeks   Status New     OT LONG TERM GOAL #5   Title Lymphedema (LE) management/ self-care:  During Management Phase CDT Pt to sustain current limb volumes within 5%, and all other clinical gains achieved during OT treatment with needed level of caregiver assistance to limit LE progression, infection risk and further functional decline.   Baseline dependent   Time 6   Period Months   Status On-going               Plan - 12/26/16 4540    Clinical Impression Statement Pt presents with moderate stage II, BLE lymphedema tarda. Pt's onset in early adulthood without known precipitating even is in keeping with the pattern onset pattern for LE Tarda. She has not previously undergone LE treatment and does not currently utilize compression garments; however, she does reportedly have "leg braces" and has a compression pump. These devices are not available for assessment today. Pt reports that leg swelling has worsened over time and no longer reduces with elevation.  BLE swelling and associated pain limits her ability to perform basic and instrumental ADLs, to perform work and other productive activities, to perform leisure activities, limits social participation, and impacts self-esteem due to negative body image. Skilled Occupational Therapy for LE care is medically necessary to reduce uncontrolled swelling and associated pain and sensory symptoms, to decrease infection risk, to improve safe ambulation and functional mobility, to increase independence with B/I ADLs, and to facilitate optimal LE self-management over time to limit further progression. Without skilled Occupational Therapy for Intensive and Management phase Complete Decongestive Therapy (CDT) this chronic condition  will progress and further functional decline is expected.   OT Frequency 3x / week   OT Duration 12 weeks   OT Treatment/Interventions Self-care/ADL training;Therapeutic exercise;Patient/family education;Manual Therapy;Manual lymph drainage;Other (comment);DME and/or AE instruction;Compression bandaging;Therapeutic activities  skin care w/ low pH lotion and castor oil   Plan CDT to RLE first, then left includes manual lymphatic drainage (MLD), skin care, ther ex, and compression therapy. When limb volume reaches clinical plateau will fit w/ BLE custom, thigh high, ccl3, Jobst Elvarex flat knit elastic compression stockings, BLE toe caps, and thigh high custoom HOS devices to limit fibrosis formation and to facilkitate improved lymphatic circulation during HOS.   Consulted and Agree with Plan of Care Patient  Patient will benefit from skilled therapeutic intervention in order to improve the following deficits and impairments:  Decreased skin integrity, Decreased knowledge of precautions, Decreased activity tolerance, Decreased knowledge of use of DME, Impaired flexibility, Decreased balance, Decreased mobility, Difficulty walking, Impaired sensation, Obesity, Decreased range of motion, Increased edema, Pain  Visit Diagnosis: Lymphedema, not elsewhere classified - Plan: Ot plan of care cert/re-cert    Problem List Patient Active Problem List   Diagnosis Date Noted  . Total knee replacement status 09/21/2015    Loel Dubonnet, MS, OTR/L, [facility] 12/26/16 2:59 PM  [place] [facility] MAIN [facility] SERVICES [address] [place], [place], [address] Phone: [phone]   Fax:  [phone]  Name: Tamara Dodson MRN: [id number] Date of Birth: 07-22-1961

## 2016-12-26 NOTE — Therapy (Signed)
[facility] MAIN [facility] SERVICES [address] [place], [place], [address] Phone: [phone]   Fax:  [phone]  Occupational Therapy Treatment  Patient Details  Name: Tamara Dodson MRN: [id number] Date of Birth: Apr 29, 1961 Referring Provider: [name], MD  Encounter Date: 12/23/2016      OT End of Session - 12/26/16 1627    Visit Number 2   Number of Visits 36   Date for OT Re-Evaluation 03/21/17   OT Start Time 0314   OT Stop Time 0415   OT Time Calculation (min) 61 min      Past Medical History:  Diagnosis Date  . Anemia   . Arthritis   . Hypertension   . Lymph edema    lower extremities    Past Surgical History:  Procedure Laterality Date  . CHOLECYSTECTOMY    . JOINT REPLACEMENT Left    knee, hip  . KNEE ARTHROPLASTY Right 09/21/2015   Procedure: COMPUTER ASSISTED TOTAL KNEE ARTHROPLASTY;  Surgeon: Donato Heinz, MD;  Location: [facility] ORS;  Service: Orthopedics;  Laterality: Right;  . TONSILLECTOMY      There were no vitals filed for this visit.      Subjective Assessment - 12/26/16 1613    Subjective  Pt is referred by [name], MD, for Occupational Therapy evaluation and treatment of bilateral lover extremity lymphedema (LE). Pt reports onset of LE when she was in collage without known precipitating event. Pt denies known family history of leg swelling.  Pt has not previously undergone lymphedema treatment, but she reports that she has seen a vein doctor"  who ruled out DVT w/ Dopler study. Pt does not utilize standard elastic compression garments, but she does have thigh length, " adjustable elastic braces". Those devices are not available fo assessment today.   Pertinent History BLE leg swelling (A-G) and associated pain/ discomfort w, onset young adulthood without known precipitating event; HTN, OA, reported B foot and distal leg numbness; chronic back pain; chronic headaches- reported; L hip arthroplasty 2012; L  knee replacement 2014; R hip arthroplasty 2016   Limitations BLE pain and swelling, L>R difficulty walking, decreased standing tolerance, difficulty fitting street shoes and LB clothing; lack of BLE sensation contributes to falls risk; chhronic back and joint pain 2/2 OA;    Patient Stated Goals reduce swelling and pain and keep it from getting worse   Pain Onset More than a month ago            [facility] OT Assessment - 12/26/16 0001      Assessment   Diagnosis Moderate, stage II, 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 Dependent positioning, inflamation 2/2 OA, and frequent surgical trauma due to BLE arthroplasty x 3 contibute to exacerbation.    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 LE precautions,     Balance Screen   Has the patient fallen in the past 6 months No     Home  Environment   Lives With Family;Spouse     Prior Function   Level of Independence Independent with basic ADLs;Independent with household mobility without device;Independent with community mobility without device;Independent with homemaking with ambulation;Independent with gait;Independent with transfers   Vocation Full time employment  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  Below knee to groin limb volume = 12636.27 ml.  Ankle to groin (A-G) limb volume = 22923.45 ml.     Left Lower Extremity Lymphedema   Other LLE below-the-knee (A-D) limb volume = 10432.15 ml.  Below knee to groin limb volume = 14836.12 ml.  Ankle to groin  (A-G) limb volume = 25265.27 ml.   Other LLE A-D limb volume measures 1.4% > than RLE A-D volume. LLE knee to groin measures 14.81% > than RLE at same landmarks. LLE A-G volume   measures 9.27% greater than full RLE                 OT Treatments/Exercises (OP) - 12/26/16 0001      ADLs   LB Dressing difficuly fitting street shoes and LB clothing 2/2 swelling   Work sedentary job contributes to LE swelling    ADL Comments decreased body image 2/2 dysmorphic limb swelling     Manual Therapy   Manual Therapy Edema management                     OT Long Term Goals - 12/26/16 1450      OT LONG TERM GOAL #1   Title Pt independent w/ lymphedema precautions and prevention  principals and strategies to limit LE progression and infection risk.   Baseline dependent   Time 2   Period Weeks   Status New     OT LONG TERM GOAL #2   Title Lymphedema (LE) management/ self-care: Pt able to apply multi layered, gradient compression wraps w Max caregiver assistance using proper techniques within 2 weeks to achieve optimal limb volume reduction.   Baseline dependent   Time 12   Period Weeks   Status New     OT LONG TERM GOAL #3   Title Lymphedema (LE) management/ self-care:  Pt to achieve at least 10% limb volume reductions bilaterally during Intensive CDT to limit LE progression, to reduce pain, and to improve safe ambulation and functional mobility.   Baseline dependent   Time 12   Period Weeks   Status New     OT LONG TERM GOAL #4   Title Lymphedema (LE) management/ self-care:  Pt to tolerate daily compression wraps, garments and devices in keeping w/ prescribed wear regime within 1 week of issue date to progress and retain clinical and functional gains and to limit LE progression.   Baseline dependent   Time 12   Period Weeks   Status New     OT LONG TERM GOAL #5   Title Lymphedema (LE) management/ self-care:  During Management Phase CDT Pt to sustain current limb  volumes within 5%, and all other clinical gains achieved during OT treatment with needed level of caregiver assistance to limit LE progression, infection risk and further functional decline.   Baseline dependent   Time 6   Period Months   Status On-going               Plan - 12/26/16 [id number]    Clinical Impression Statement Pt presents with moderate stage II, BLE lymphedema tarda. Pt's onset in early adulthood without known precipitating even is in keeping with the pattern onset pattern for LE Tarda.  She has not previously undergone LE treatment and does not currently utilize compression garments; however, she does reportedly have "leg braces" and has a compression pump. These devices are not available for assessment today. Pt reports that leg swelling has worsened over time and no longer reduces with elevation.  BLE swelling and associated pain limits her ability to perform basic and instrumental ADLs, to perform work and other productive activities, to perform leisure activities, limits social participation, and impacts self-esteem due to negative body image. Skilled Occupational Therapy for LE care is medically necessary to reduce uncontrolled swelling and associated pain and sensory symptoms, to decrease infection risk, to improve safe ambulation and functional mobility, to increase independence with B/I ADLs, and to facilitate optimal LE self-management over time to limit further progression. Without skilled Occupational Therapy for Intensive and Management phase Complete Decongestive Therapy (CDT) this chronic condition will progress and further functional decline is expected.   OT Frequency 3x / week   OT Duration 12 weeks   OT Treatment/Interventions Self-care/ADL training;Therapeutic exercise;Patient/family education;Manual Therapy;Manual lymph drainage;Other (comment);DME and/or AE instruction;Compression bandaging;Therapeutic activities  skin care w/ low pH lotion and castor oil   Plan  CDT to RLE first, then left includes manual lymphatic drainage (MLD), skin care, ther ex, and compression therapy. When limb volume reaches clinical plateau will fit w/ BLE custom, thigh high, ccl3, Jobst Elvarex flat knit elastic compression stockings, BLE toe caps, and thigh high custoom HOS devices to limit fibrosis formation and to facilkitate improved lymphatic circulation during HOS.   Consulted and Agree with Plan of Care Patient      Patient will benefit from skilled therapeutic intervention in order to improve the following deficits and impairments:  Decreased skin integrity, Decreased knowledge of precautions, Decreased activity tolerance, Decreased knowledge of use of DME, Impaired flexibility, Decreased balance, Decreased mobility, Difficulty walking, Impaired sensation, Obesity, Decreased range of motion, Increased edema, Pain  Visit Diagnosis: Lymphedema, not elsewhere classified    Problem List Patient Active Problem List   Diagnosis Date Noted  . Total knee replacement status 09/21/2015    Loel Dubonnet, MS, OTR/L, [facility] 12/26/16 4:34 PM  [facility] MAIN [facility] SERVICES [address] [place], [place], [address] Phone: [phone]   Fax:  [phone]  Name: Tamara Dodson MRN: [id number] Date of Birth: June 12, 1961

## 2016-12-28 ENCOUNTER — Ambulatory Visit: Payer: BLUE CROSS/BLUE SHIELD | Admitting: Occupational Therapy

## 2016-12-28 DIAGNOSIS — I890 Lymphedema, not elsewhere classified: Secondary | ICD-10-CM | POA: Diagnosis not present

## 2016-12-28 NOTE — Patient Instructions (Signed)
LE instructions and precautions as established- see initial eval.   

## 2016-12-28 NOTE — Therapy (Signed)
[facility] MAIN [facility] [address] [place], [place], [address] Phone: [phone]   Fax:  [phone]  Occupational Therapy Treatment  Patient Details  Name: Tamara Dodson MRN: [id number] Date of Birth: 06/07/1961 Referring Provider: Jerl MinaJames Hedrick, MD  Encounter Date: 12/28/2016      OT End of Session - 12/28/16 1609    Visit Number 4   Number of Visits 36   Date for OT Re-Evaluation 03/21/17   OT Start Time 0309   OT Stop Time 0411   OT Time Calculation (min) 62 min      Past Medical History:  Diagnosis Date  . Anemia   . Arthritis   . Hypertension   . Lymph edema    lower extremities    Past Surgical History:  Procedure Laterality Date  . CHOLECYSTECTOMY    . JOINT REPLACEMENT Left    knee, hip  . KNEE ARTHROPLASTY Right 09/21/2015   Procedure: COMPUTER ASSISTED TOTAL KNEE ARTHROPLASTY;  Surgeon: Donato HeinzJames P Hooten, MD;  Location: [facility] ORS;  Service: Orthopedics;  Laterality: Right;  . TONSILLECTOMY      There were no vitals filed for this visit.      Subjective Assessment - 12/28/16 1612    Subjective  Pt presents for OT visit 4 to address BLE LE. Pt is unaccompanied today. Pt reports mild difficulty again keeping bandages up after last session.  Pt was able to reapply wraps at home without assistance.   Pertinent History BLE leg swelling (A-G) and associated pain/ discomfort w, onset young adulthood without known precipitating event; HTN, OA, reported B foot and distal leg numbness; chronic back pain; chronic headaches- reported; L hip arthroplasty 2012; L knee replacement 2014; R hip arthroplasty 2016   Limitations BLE pain and swelling, L>R difficulty walking, decreased standing tolerance, difficulty fitting street shoes and LB clothing; lack of BLE sensation contributes to falls risk; chhronic back and joint pain 2/2 OA;    Patient Stated Goals reduce swelling and pain and keep it from getting worse   Currently in  Pain? No/denies   Pain Onset More than a month ago                      OT Treatments/Exercises (OP) - 12/28/16 0001      ADLs   ADL Education Given Yes     Manual Therapy   Manual Therapy Edema management;Manual Lymphatic Drainage (MLD);Compression Bandaging;Other (comment)  skin care   Manual Lymphatic Drainage (MLD) Manual lymph drainage (MLD) to RLE in supine utilizing functional inguinal lymph nodes and deep abdominal lymphatics as is customary for non-cancer related lower extremity LE, including bilateral "short neck" sequence, J strokes to sub and supraclavicular LN, deep abdominal pathways, functional inguinal LN, lower extremity proximal to distal w/ emphasis on medial knee bottleneck and politeal LN. Performed fibrosis technique to B maleoli and distal  legs to address fatty fibrosis. Good tolerance.   Compression Bandaging LLE gradient compression wraps applied circumferentially in gradient configuration from toes to groin as follows: custom toe wrap using Transelast Classic folded in half under cotton stockinett applied from toes to groin; 8 cm x 5 m x 1 short stretch wrap to foot and ankle, 10 cm x 5 m x 1, then 12 cm x 5- all layered over .04 x 10 cm,12 cm and 15 cm Rosidol Soft foam from A-G.  OT Education - 12/28/16 1615    Education provided Yes   Education Details Con tinued skilled Pt/caregiver Education  And LE ADL training throughout visit for lymphedema self care, including compression wrapping, compression garment and device wear/care, lymphatic pumping ther ex, simple self-MLD, and skin care. Discussed progress towards goals.   Person(s) Educated Patient   Methods Explanation;Demonstration   Comprehension Verbalized understanding;Need further instruction             OT Long Term Goals - 12/26/16 1450      OT LONG TERM GOAL #1   Title Pt independent w/ lymphedema precautions and prevention  principals and strategies to  limit LE progression and infection risk.   Baseline dependent   Time 2   Period Weeks   Status New     OT LONG TERM GOAL #2   Title Lymphedema (LE) management/ self-care: Pt able to apply multi layered, gradient compression wraps w Max caregiver assistance using proper techniques within 2 weeks to achieve optimal limb volume reduction.   Baseline dependent   Time 12   Period Weeks   Status New     OT LONG TERM GOAL #3   Title Lymphedema (LE) management/ self-care:  Pt to achieve at least 10% limb volume reductions bilaterally during Intensive CDT to limit LE progression, to reduce pain, and to improve safe ambulation and functional mobility.   Baseline dependent   Time 12   Period Weeks   Status New     OT LONG TERM GOAL #4   Title Lymphedema (LE) management/ self-care:  Pt to tolerate daily compression wraps, garments and devices in keeping w/ prescribed wear regime within 1 week of issue date to progress and retain clinical and functional gains and to limit LE progression.   Baseline dependent   Time 12   Period Weeks   Status New     OT LONG TERM GOAL #5   Title Lymphedema (LE) management/ self-care:  During Management Phase CDT Pt to sustain current limb volumes within 5%, and all other clinical gains achieved during OT treatment with needed level of caregiver assistance to limit LE progression, infection risk and further functional decline.   Baseline dependent   Time 6   Period Months   Status On-going               Plan - 12/28/16 1616    Clinical Impression Statement Pt demonstrates increased independence and good compliance with LE self care HP for compression wrapping between visits. LLE is decreased in volume below the knee since initiating CDT by visual assessment. Limb is less palpably dense below the knee, but  has a ways to go toward  significant deconmgestion due to long term build up of fatty fibrosis. Pt tolerating compression well and MLD today without  difficult.   OT Frequency 3x / week   OT Duration 12 weeks   OT Treatment/Interventions Self-care/ADL training;Therapeutic exercise;Patient/family education;Manual Therapy;Manual lymph drainage;Other (comment);DME and/or AE instruction;Compression bandaging;Therapeutic activities  skin care w/ low pH lotion and castor oil   Consulted and Agree with Plan of Care Patient      Patient will benefit from skilled therapeutic intervention in order to improve the following deficits and impairments:  Decreased skin integrity, Decreased knowledge of precautions, Decreased activity tolerance, Decreased knowledge of use of DME, Impaired flexibility, Decreased balance, Decreased mobility, Difficulty walking, Impaired sensation, Obesity, Decreased range of motion, Increased edema, Pain, Decreased strength  Visit Diagnosis: Lymphedema, not elsewhere classified  Problem List Patient Active Problem List   Diagnosis Date Noted  . Total knee replacement status 09/21/2015   Loel Dubonnet, MS, OTR/L, [facility] 12/28/16 4:20 PM   [facility] MAIN [facility] SERVICES [address] [place], [place], [address] Phone: [phone]   Fax:  [phone]  Name: Tamara Dodson MRN: [id number] Date of Birth: Sep 24, 1961

## 2016-12-30 ENCOUNTER — Encounter: Payer: BLUE CROSS/BLUE SHIELD | Admitting: Occupational Therapy

## 2016-12-30 ENCOUNTER — Ambulatory Visit: Payer: BLUE CROSS/BLUE SHIELD | Admitting: Occupational Therapy

## 2017-01-04 ENCOUNTER — Encounter: Payer: BLUE CROSS/BLUE SHIELD | Admitting: Occupational Therapy

## 2017-01-10 ENCOUNTER — Ambulatory Visit: Payer: BLUE CROSS/BLUE SHIELD | Admitting: Occupational Therapy

## 2017-01-10 DIAGNOSIS — I890 Lymphedema, not elsewhere classified: Secondary | ICD-10-CM

## 2017-01-10 NOTE — Therapy (Signed)
[facility] [address] [place], [place], [address] Phone: [phone]   Fax:  [phone]  Occupational Therapy Treatment  Patient Details  Name: Tamara Dodson MRN: [id number] Date of Birth: 05/26/1961 Referring Provider: Jerl MinaJames Hedrick, MD  Encounter Date: 01/10/2017      OT End of Session - 01/10/17 1627    Visit Number 5   Number of Visits 36   Date for OT Re-Evaluation 03/21/17   OT Start Time 0315   OT Stop Time 0422   OT Time Calculation (min) 67 min      Past Medical History:  Diagnosis Date  . Anemia   . Arthritis   . Hypertension   . Lymph edema    lower extremities    Past Surgical History:  Procedure Laterality Date  . CHOLECYSTECTOMY    . JOINT REPLACEMENT Left    knee, hip  . KNEE ARTHROPLASTY Right 09/21/2015   Procedure: COMPUTER ASSISTED TOTAL KNEE ARTHROPLASTY;  Surgeon: Donato HeinzJames P Hooten, MD;  Location: [facility] ORS;  Service: Orthopedics;  Laterality: Right;  . TONSILLECTOMY      There were no vitals filed for this visit.      Subjective Assessment - 01/10/17 1627    Subjective  Pt presents for OT visit 5 to address BLE LE. Pt reports increased itching, burning and tingling in LLE from knee down to ankle overe the weekend. Sensory discomfort limited tolerance for compression and she had to remove the wraps.   Pertinent History BLE leg swelling (A-G) and associated pain/ discomfort w, onset young adulthood without known precipitating event; HTN, OA, reported B foot and distal leg numbness; chronic back pain; chronic headaches- reported; L hip arthroplasty 2012; L knee replacement 2014; R hip arthroplasty 2016   Limitations BLE pain and swelling, L>R difficulty walking, decreased standing tolerance, difficulty fitting street shoes and LB clothing; lack of BLE sensation contributes to falls risk; chhronic back and joint pain 2/2 OA;    Patient Stated Goals reduce swelling and pain and keep it from  getting worse   Currently in Pain? 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 no Rx to LLE   Manual Lymphatic Drainage (MLD) RLE MLD as established. NO LLE MLD today 2/2 neuropathy   Compression Bandaging compression nwraps to RLE as established. Knee length only                OT Education - 01/10/17 1633    Education provided Yes   Education Details Con tinued skilled Pt/caregiver Education  And LE ADL training throughout visit for lymphedema self care, including compression wrapping, compression garment and device wear/care, lymphatic pumping ther ex, simple self-MLD, and skin care. Discussed progress towards goals.   Person(s) Educated Patient   Methods Explanation   Comprehension Verbalized understanding;Need further instruction             OT Long Term Goals - 12/26/16 1450      OT LONG TERM GOAL #  1   Title Pt independent w/ lymphedema precautions and prevention  principals and strategies to limit LE progression and infection risk.   Baseline dependent   Time 2   Period Weeks   Status New     OT LONG TERM GOAL #2   Title Lymphedema (LE) management/ self-care: Pt able to apply multi layered, gradient compression wraps w Max caregiver assistance using proper techniques within 2 weeks to achieve optimal limb volume reduction.   Baseline dependent   Time 12   Period Weeks   Status New     OT LONG TERM GOAL #3   Title Lymphedema (LE) management/ self-care:  Pt to achieve at least 10% limb volume reductions bilaterally during Intensive CDT to limit LE progression, to  reduce pain, and to improve safe ambulation and functional mobility.   Baseline dependent   Time 12   Period Weeks   Status New     OT LONG TERM GOAL #4   Title Lymphedema (LE) management/ self-care:  Pt to tolerate daily compression wraps, garments and devices in keeping w/ prescribed wear regime within 1 week of issue date to progress and retain clinical and functional gains and to limit LE progression.   Baseline dependent   Time 12   Period Weeks   Status New     OT LONG TERM GOAL #5   Title Lymphedema (LE) management/ self-care:  During Management Phase CDT Pt to sustain current limb volumes within 5%, and all other clinical gains achieved during OT treatment with needed level of caregiver assistance to limit LE progression, infection risk and further functional decline.   Baseline dependent   Time 6   Period Months   Status On-going               Plan - 01/10/17 1634    Clinical Impression Statement Pt had .difficulty tolerating compression wraps over the weekend due to increased intensity in premorbid neuropathic  discomfort in distal LLE and foot. Pt reports inability to tolerate bed covers on her skin over night is typical. Decreasing wrap length to knee only has solved  problem with wrap falling down from thgh. We switched wraps to RLE today in effort to reduce stiomulation to nervous system on L. Hopefully Pt will be able to tolerate compression over night.    OT Frequency 3x / week   OT Duration 12 weeks   OT Treatment/Interventions Self-care/ADL training;Therapeutic exercise;Patient/family education;Manual Therapy;Manual lymph drainage;Other (comment);DME and/or AE instruction;Compression bandaging;Therapeutic activities  skin care w/ low pH lotion and castor oil   Consulted and Agree with Plan of Care Patient      Patient will benefit from skilled therapeutic intervention in order to improve the following deficits and impairments:  Decreased skin integrity,  Decreased knowledge of precautions, Decreased activity tolerance, Decreased knowledge of use of DME, Impaired flexibility, Decreased balance, Decreased mobility, Difficulty walking, Impaired sensation, Obesity, Decreased range of motion, Increased edema, Pain, Decreased strength  Visit Diagnosis: Lymphedema, not elsewhere classified    Problem List Patient Active Problem List   Diagnosis Date Noted  . Total knee replacement status 09/21/2015    Loel Dubonnet, MS, OTR/L, [facility] 01/10/17 4:38 PM  [place] MAIN [facility] SERVICES [address] [place], [place], [address] Phone: [phone]   Fax:  [phone]  Name: Tamara Dodson MRN: [id number] Date of Birth: June 02, 1961

## 2017-01-10 NOTE — Patient Instructions (Signed)
LE instructions and precautions as established- see initial eval.   

## 2017-01-11 ENCOUNTER — Ambulatory Visit: Payer: BLUE CROSS/BLUE SHIELD | Admitting: Occupational Therapy

## 2017-01-11 DIAGNOSIS — I890 Lymphedema, not elsewhere classified: Secondary | ICD-10-CM

## 2017-01-11 NOTE — Therapy (Signed)
[facility] MAIN [facility] SERVICES [address] [place], [place], [address] Phone: [phone]   Fax:  [phone]  Occupational Therapy Treatment  Patient Details  Name: Tamara Dodson MRN: [id number] Date of Birth: [birth year] Referring Provider: [name], MD  Encounter Date: 01/11/2017      OT End of Session - 01/11/17 1619    Visit Number 6   Number of Visits 36   Date for OT Re-Evaluation 03/21/17   OT Start Time 0300   OT Stop Time 0400   OT Time Calculation (min) 60 min   Activity Tolerance Patient tolerated treatment well;No increased pain   Behavior During Therapy WFL for tasks assessed/performed      Past Medical History:  Diagnosis Date  . Anemia   . Arthritis   . Hypertension   . Lymph edema    lower extremities    Past Surgical History:  Procedure Laterality Date  . CHOLECYSTECTOMY    . JOINT REPLACEMENT Left    knee, hip  . KNEE ARTHROPLASTY Right 09/21/2015   Procedure: COMPUTER ASSISTED TOTAL KNEE ARTHROPLASTY;  Surgeon: Donato Heinz, MD;  Location: [facility] ORS;  Service: Orthopedics;  Laterality: Right;  . TONSILLECTOMY      There were no vitals filed for this visit.      Subjective Assessment - 01/11/17 1615    Subjective  Pt presents for OT visit 6 to address BLE LE. Pt reports intense itching started below the knee as soon as she removed RLE compression wraps before treatment today.    Pertinent History BLE leg swelling (A-G) and associated pain/ discomfort w, onset young adulthood without known precipitating event; HTN, OA, reported B foot and distal leg numbness; chronic back pain; chronic headaches- reported; L hip arthroplasty 2012; L knee replacement 2014; R hip arthroplasty 2016   Limitations BLE pain and swelling, L>R difficulty walking, decreased standing tolerance, difficulty fitting street shoes and LB clothing; lack of BLE sensation contributes to falls risk; chhronic back and joint pain 2/2 OA;    Patient Stated Goals reduce swelling and pain and keep it from getting worse   Currently in Pain? Yes  RLE itching   from ankle to below knee   Pain Orientation Right   Pain Descriptors / Indicators Burning;Pins and needles;Other (Comment)   Pain Onset More than a month ago                      OT Treatments/Exercises (OP) - 01/11/17 0001      ADLs   ADL Education Given Yes     Manual Therapy   Manual Therapy Edema management;Manual Lymphatic Drainage (MLD);Compression Bandaging   Manual therapy comments skin care as established to RLE. no Rx to LLE   Manual Lymphatic Drainage (MLD) RLE MLD as established. NO LLE MLD today 2/2 neuropathy   Compression Bandaging compression nwraps to RLE as established. Knee length only                OT Education - 01/11/17 1618    Education provided Yes   Education Details Con tinued skilled Pt/caregiver Education  And LE ADL training throughout visit for lymphedema self care, including compression wrapping, compression garment and device wear/care, lymphatic pumping ther ex, simple self-MLD, and skin care. Discussed progress towards goals.   Person(s) Educated Patient   Methods Explanation   Comprehension Verbalized understanding;Need further instruction  OT Long Term Goals - 12/26/16 1450      OT LONG TERM GOAL #1   Title Pt independent w/ lymphedema precautions and prevention  principals and strategies to limit LE progression and infection risk.   Baseline dependent   Time 2   Period Weeks   Status New     OT LONG TERM GOAL #2   Title Lymphedema (LE) management/ self-care: Pt able to apply multi layered, gradient compression wraps w Max caregiver assistance using proper techniques within 2 weeks to achieve optimal limb volume reduction.   Baseline dependent   Time 12   Period Weeks   Status New     OT LONG TERM GOAL #3   Title Lymphedema (LE) management/ self-care:  Pt to achieve at least 10%  limb volume reductions bilaterally during Intensive CDT to limit LE progression, to reduce pain, and to improve safe ambulation and functional mobility.   Baseline dependent   Time 12   Period Weeks   Status New     OT LONG TERM GOAL #4   Title Lymphedema (LE) management/ self-care:  Pt to tolerate daily compression wraps, garments and devices in keeping w/ prescribed wear regime within 1 week of issue date to progress and retain clinical and functional gains and to limit LE progression.   Baseline dependent   Time 12   Period Weeks   Status New     OT LONG TERM GOAL #5   Title Lymphedema (LE) management/ self-care:  During Management Phase CDT Pt to sustain current limb volumes within 5%, and all other clinical gains achieved during OT treatment with needed level of caregiver assistance to limit LE progression, infection risk and further functional decline.   Baseline dependent   Time 6   Period Months   Status On-going               Plan - 01/11/17 1620    Clinical Impression Statement Pt experiencing histamine reaction when removing compression and she is having  difficulty tolerating the itching sensation. Itching and redness decreased w/ MLD, but did not entirely resolve. Cont as per POC in effort to desensitize limb.   OT Frequency 3x / week   OT Duration 12 weeks   OT Treatment/Interventions Self-care/ADL training;Therapeutic exercise;Patient/family education;Manual Therapy;Manual lymph drainage;Other (comment);DME and/or AE instruction;Compression bandaging;Therapeutic activities  skin care w/ low pH lotion and castor oil   Consulted and Agree with Plan of Care Patient      Patient will benefit from skilled therapeutic intervention in order to improve the following deficits and impairments:  Decreased skin integrity, Decreased knowledge of precautions, Decreased activity tolerance, Decreased knowledge of use of DME, Impaired flexibility, Decreased balance, Decreased  mobility, Difficulty walking, Impaired sensation, Obesity, Decreased range of motion, Increased edema, Pain, Decreased strength  Visit Diagnosis: Lymphedema, not elsewhere classified    Problem List Patient Active Problem List   Diagnosis Date Noted  . Total knee replacement status 09/21/2015    Loel Dubonnetheresa Coleton Woon, MS, OTR/L, [facility] 01/11/17 4:22 PM  [facility] MAIN [facility] [address] [place], [place], [address] Phone: [phone]   Fax:  [phone]  Name: Noland FordyceRenee L Cale MRN: [id number] Date of Birth: 06/15/1961

## 2017-01-13 ENCOUNTER — Ambulatory Visit: Payer: BLUE CROSS/BLUE SHIELD | Admitting: Occupational Therapy

## 2017-01-13 ENCOUNTER — Encounter: Payer: BLUE CROSS/BLUE SHIELD | Admitting: Occupational Therapy

## 2017-01-13 DIAGNOSIS — I890 Lymphedema, not elsewhere classified: Secondary | ICD-10-CM

## 2017-01-13 NOTE — Therapy (Signed)
[facility] MAIN [facility] [address] [place], [place], [address] Phone: [phone]   Fax:  [phone]  Occupational Therapy Treatment  Patient Details  Name: Tamara FordyceRenee L Dodson MRN: [id number] Date of Birth: 11/23/1961 Referring Provider: Jerl MinaJames Hedrick, MD  Encounter Date: 01/13/2017      OT End of Session - 01/13/17 1608    Visit Number 7   Number of Visits 36   Date for OT Re-Evaluation 03/21/17   OT Start Time 0245   OT Stop Time 0400   OT Time Calculation (min) 75 min   Activity Tolerance Patient tolerated treatment well;No increased pain   Behavior During Therapy WFL for tasks assessed/performed      Past Medical History:  Diagnosis Date  . Anemia   . Arthritis   . Hypertension   . Lymph edema    lower extremities    Past Surgical History:  Procedure Laterality Date  . CHOLECYSTECTOMY    . JOINT REPLACEMENT Left    knee, hip  . KNEE ARTHROPLASTY Right 09/21/2015   Procedure: COMPUTER ASSISTED TOTAL KNEE ARTHROPLASTY;  Surgeon: Donato HeinzJames P Hooten, MD;  Location: [facility] ORS;  Service: Orthopedics;  Laterality: Right;  . TONSILLECTOMY      There were no vitals filed for this visit.      Subjective Assessment - 01/13/17 1558    Subjective  Pt presents for OT visit 7 to address BLE LE. Pt reports intense itching has continued during visit interval. She's tolerating compression wraps but with significant difficulty.   Pertinent History BLE leg swelling (A-G) and associated pain/ discomfort w, onset young adulthood without known precipitating event; HTN, OA, reported B foot and distal leg numbness; chronic back pain; chronic headaches- reported; L hip arthroplasty 2012; L knee replacement 2014; R hip arthroplasty 2016   Limitations BLE pain and swelling, L>R difficulty walking, decreased standing tolerance, difficulty fitting street shoes and LB clothing; lack of BLE sensation contributes to falls risk; chhronic back and joint pain  2/2 OA;    Patient Stated Goals reduce swelling and pain and keep it from getting worse   Currently in Pain? No/denies   Pain Onset More than a month ago             LYMPHEDEMA/ONCOLOGY QUESTIONNAIRE - 01/13/17 1609      Right Lower Extremity Lymphedema   Other RLE below-the-knee (A-D) limb volume =  8186.96 ml.    Other RLE A-D limb volume is decreased by 20.42% since initially measured on 12/23/16.     Left Lower Extremity Lymphedema   Other LLE below-the-knee (A-D) limb volume = 9528.99 ml.   Other LLE A-D limb volume is decreaded by 8.66% since initially measured on 12/23/16                 OT Treatments/Exercises (OP) - 01/13/17 0001      ADLs   ADL Education Given Yes     Manual Therapy   Manual Therapy Edema management;Manual Lymphatic Drainage (MLD);Compression Bandaging   Manual therapy comments skin care to LLE using Eucerin lotion. No caastor oil today to rule out possible allergic reaction   Edema Management completed BLE comparative lim volumetrics, A-D only   Manual Lymphatic Drainage (MLD) LLE  MLD as established. NO RLE MLD today 2/2 neuropathy and excessive itching below the knee   Compression Bandaging Issued clean stockinett for LLE  wraps today. Utilized Artiflex under wraps vs Rosidal foam in effort to rulle out alergic  reaction.                OT Education - 01/13/17 1607    Education provided Yes   Education Details Con tinued Artist Education  And LE ADL training throughout visit for lymphedema self care, including compression wrapping, compression garment and device wear/care, lymphatic pumping ther ex, simple self-MLD, and skin care. Discussed progress towards goals.   Person(s) Educated Patient   Methods Explanation;Demonstration   Comprehension Verbalized understanding;Need further instruction             OT Long Term Goals - 12/26/16 1450      OT LONG TERM GOAL #1   Title Pt independent w/ lymphedema  precautions and prevention  principals and strategies to limit LE progression and infection risk.   Baseline dependent   Time 2   Period Weeks   Status New     OT LONG TERM GOAL #2   Title Lymphedema (LE) management/ self-care: Pt able to apply multi layered, gradient compression wraps w Max caregiver assistance using proper techniques within 2 weeks to achieve optimal limb volume reduction.   Baseline dependent   Time 12   Period Weeks   Status New     OT LONG TERM GOAL #3   Title Lymphedema (LE) management/ self-care:  Pt to achieve at least 10% limb volume reductions bilaterally during Intensive CDT to limit LE progression, to reduce pain, and to improve safe ambulation and functional mobility.   Baseline dependent   Time 12   Period Weeks   Status New     OT LONG TERM GOAL #4   Title Lymphedema (LE) management/ self-care:  Pt to tolerate daily compression wraps, garments and devices in keeping w/ prescribed wear regime within 1 week of issue date to progress and retain clinical and functional gains and to limit LE progression.   Baseline dependent   Time 12   Period Weeks   Status New     OT LONG TERM GOAL #5   Title Lymphedema (LE) management/ self-care:  During Management Phase CDT Pt to sustain current limb volumes within 5%, and all other clinical gains achieved during OT treatment with needed level of caregiver assistance to limit LE progression, infection risk and further functional decline.   Baseline dependent   Time 6   Period Months   Status On-going               Plan - 01/13/17 1613    Clinical Impression Statement Pt continues to experience excessive lower leg itching on both wrapped and unwrapped legs   below knees  between visits. Provided clean stockinet and replaced RosidAL FOAM WITH Artiflex in effort to decrease itching in case of alergic reaction to either of these materials. Comparative limb volumetrics reveal  significant volume reductions  bilaterally.  LLE is decreased by 8.66% and RLE is decreased by 20.42% since initially measured on 12/24/15. RLE exceeds goal by 2x and LLE value is approaching 10% goal range. Itching remainsa greatest obstacle to tolerance for CDT. Cont as per POC.   OT Frequency 3x / week   OT Duration 12 weeks   OT Treatment/Interventions Self-care/ADL training;Therapeutic exercise;Patient/family education;Manual Therapy;Manual lymph drainage;Other (comment);DME and/or AE instruction;Compression bandaging;Therapeutic activities  skin care w/ low pH lotion and castor oil   Consulted and Agree with Plan of Care Patient      Patient will benefit from skilled therapeutic intervention in order to improve the following deficits and impairments:  Decreased  skin integrity, Decreased knowledge of precautions, Decreased activity tolerance, Decreased knowledge of use of DME, Impaired flexibility, Decreased balance, Decreased mobility, Difficulty walking, Impaired sensation, Obesity, Decreased range of motion, Increased edema, Pain, Decreased strength  Visit Diagnosis: Lymphedema, not elsewhere classified    Problem List Patient Active Problem List   Diagnosis Date Noted  . Total knee replacement status 09/21/2015    Loel Dubonnet, MS, OTR/L, [facility] 01/13/17 4:18 PM  [place] [facility] MAIN [facility] SERVICES [address] [place], [place], [address] Phone: [phone]   Fax:  [phone]  Name: Tamara Dodson MRN: [id number] Date of Birth: Jan 23, 1961

## 2017-01-16 ENCOUNTER — Encounter: Payer: BLUE CROSS/BLUE SHIELD | Admitting: Occupational Therapy

## 2017-01-17 ENCOUNTER — Ambulatory Visit: Payer: BLUE CROSS/BLUE SHIELD | Admitting: Occupational Therapy

## 2017-01-17 DIAGNOSIS — I890 Lymphedema, not elsewhere classified: Secondary | ICD-10-CM | POA: Diagnosis not present

## 2017-01-17 NOTE — Therapy (Signed)
[facility] MAIN [facility] SERVICES [address] [place], [place], [address] Phone: [phone]   Fax:  [phone]  Occupational Therapy Treatment  Patient Details  Name: Tamara Dodson MRN: [id number] Date of Birth: 1961-07-11 Referring Provider: [name], MD  Encounter Date: 01/17/2017      OT End of Session - 01/17/17 1633    Visit Number 8   Number of Visits 36   Date for OT Re-Evaluation 03/21/17   OT Start Time 0305   OT Stop Time 0410   OT Time Calculation (min) 65 min   Activity Tolerance Patient tolerated treatment well;No increased pain   Behavior During Therapy WFL for tasks assessed/performed      Past Medical History:  Diagnosis Date  . Anemia   . Arthritis   . Hypertension   . Lymph edema    lower extremities    Past Surgical History:  Procedure Laterality Date  . CHOLECYSTECTOMY    . JOINT REPLACEMENT Left    knee, hip  . KNEE ARTHROPLASTY Right 09/21/2015   Procedure: COMPUTER ASSISTED TOTAL KNEE ARTHROPLASTY;  Surgeon: Donato Heinz, MD;  Location: [facility] ORS;  Service: Orthopedics;  Laterality: Right;  . TONSILLECTOMY      There were no vitals filed for this visit.      Subjective Assessment - 01/17/17 1628    Subjective  Pt presents for OT visit 8 to address BLE LE. Pt reports itching is becoming more tolerable. "I'm just trying to ignor it. You know, I left the wraps off on Saturday and my L leg didnt swell much at all."   Pertinent History BLE leg swelling (A-G) and associated pain/ discomfort w, onset young adulthood without known precipitating event; HTN, OA, reported B foot and distal leg numbness; chronic back pain; chronic headaches- reported; L hip arthroplasty 2012; L knee replacement 2014; R hip arthroplasty 2016   Limitations BLE pain and swelling, L>R difficulty walking, decreased standing tolerance, difficulty fitting street shoes and LB clothing; lack of BLE sensation contributes to falls risk;  chhronic back and joint pain 2/2 OA;    Patient Stated Goals reduce swelling and pain and keep it from getting worse   Currently in Pain? No/denies  persistent itching and sensory discomfort becoming  more tolerable by report. No numerical rating.   Pain Onset More than a month ago                      OT Treatments/Exercises (OP) - 01/17/17 0001      ADLs   ADL Education Given Yes     Manual Therapy   Manual Therapy Edema management;Manual Lymphatic Drainage (MLD);Compression Bandaging   Manual therapy comments skin care to LLE using Eucerin lotion. No caastor oil today to rule out possible allergic reaction   Edema Management completed BLE comparative lim volumetrics, A-D only   Manual Lymphatic Drainage (MLD) LLE  MLD as established. NO RLE MLD today 2/2 neuropathy and excessive itching below the knee   Compression Bandaging Resumed LLE wrap as established. DC Artiflex as Pt reports the wraps fell down soon after applying them.                OT Education - 01/17/17 1632    Education provided Yes   Education Details Con tinued skilled Pt/caregiver Education  And LE ADL training throughout visit for lymphedema self care, including compression wrapping, compression garment and device wear/care, lymphatic pumping ther ex, simple  self-MLD, and skin care. Discussed progress towards goals.   Person(s) Educated Patient   Methods Explanation   Comprehension Verbalized understanding;Need further instruction             OT Long Term Goals - 12/26/16 1450      OT LONG TERM GOAL #1   Title Pt independent w/ lymphedema precautions and prevention  principals and strategies to limit LE progression and infection risk.   Baseline dependent   Time 2   Period Weeks   Status New     OT LONG TERM GOAL #2   Title Lymphedema (LE) management/ self-care: Pt able to apply multi layered, gradient compression wraps w Max caregiver assistance using proper techniques within 2  weeks to achieve optimal limb volume reduction.   Baseline dependent   Time 12   Period Weeks   Status New     OT LONG TERM GOAL #3   Title Lymphedema (LE) management/ self-care:  Pt to achieve at least 10% limb volume reductions bilaterally during Intensive CDT to limit LE progression, to reduce pain, and to improve safe ambulation and functional mobility.   Baseline dependent   Time 12   Period Weeks   Status New     OT LONG TERM GOAL #4   Title Lymphedema (LE) management/ self-care:  Pt to tolerate daily compression wraps, garments and devices in keeping w/ prescribed wear regime within 1 week of issue date to progress and retain clinical and functional gains and to limit LE progression.   Baseline dependent   Time 12   Period Weeks   Status New     OT LONG TERM GOAL #5   Title Lymphedema (LE) management/ self-care:  During Management Phase CDT Pt to sustain current limb volumes within 5%, and all other clinical gains achieved during OT treatment with needed level of caregiver assistance to limit LE progression, infection risk and further functional decline.   Baseline dependent   Time 6   Period Months   Status On-going               Plan - 01/17/17 1633    Clinical Impression Statement Intense itching last week is becoming more tolerant and Pt is in less discomfort when wraps. Pt is able to wrap independently and id approaching goal range compliance w/ LE self care home program. LLE is much less dense and pliable today which is evidence of lymphatic decongestion and improved flow. Cont as per POC.   OT Frequency 3x / week   OT Duration 12 weeks   OT Treatment/Interventions Self-care/ADL training;Therapeutic exercise;Patient/family education;Manual Therapy;Manual lymph drainage;Other (comment);DME and/or AE instruction;Compression bandaging;Therapeutic activities  skin care w/ low pH lotion and castor oil   Consulted and Agree with Plan of Care Patient      Patient  will benefit from skilled therapeutic intervention in order to improve the following deficits and impairments:  Decreased skin integrity, Decreased knowledge of precautions, Decreased activity tolerance, Decreased knowledge of use of DME, Impaired flexibility, Decreased balance, Decreased mobility, Difficulty walking, Impaired sensation, Obesity, Decreased range of motion, Increased edema, Pain, Decreased strength  Visit Diagnosis: Lymphedema, not elsewhere classified    Problem List Patient Active Problem List   Diagnosis Date Noted  . Total knee replacement status 09/21/2015    Loel Dubonnet, MS, OTR/L, [facility] 01/17/17 4:36 PM  [place] [facility] MAIN [facility] SERVICES [address] [place], [place], [address] Phone: [phone]   Fax:  [phone]  Name: Tamara Dodson MRN: [id number] Date of Birth: 04/02/1961

## 2017-01-19 ENCOUNTER — Ambulatory Visit: Payer: BLUE CROSS/BLUE SHIELD | Attending: Family Medicine | Admitting: Occupational Therapy

## 2017-01-19 DIAGNOSIS — I890 Lymphedema, not elsewhere classified: Secondary | ICD-10-CM | POA: Insufficient documentation

## 2017-01-19 NOTE — Therapy (Signed)
[facility] [address] [place], [place], [address] Phone: [phone]   Fax:  [phone]  Occupational Therapy Treatment  Patient Details  Name: Tamara Dodson MRN: [id number] Date of Birth: 08/18/1961 Referring Provider: Jerl MinaJames Hedrick, MD  Encounter Date: 01/19/2017      OT End of Session - 01/19/17 1611    Visit Number 9   Number of Visits 36   Date for OT Re-Evaluation 03/21/17   OT Start Time 0308   OT Stop Time 0408   OT Time Calculation (min) 60 min   Activity Tolerance Patient tolerated treatment well;No increased pain   Behavior During Therapy WFL for tasks assessed/performed      Past Medical History:  Diagnosis Date  . Anemia   . Arthritis   . Hypertension   . Lymph edema    lower extremities    Past Surgical History:  Procedure Laterality Date  . CHOLECYSTECTOMY    . JOINT REPLACEMENT Left    knee, hip  . KNEE ARTHROPLASTY Right 09/21/2015   Procedure: COMPUTER ASSISTED TOTAL KNEE ARTHROPLASTY;  Surgeon: Donato HeinzJames P Hooten, MD;  Location: [facility] ORS;  Service: Orthopedics;  Laterality: Right;  . TONSILLECTOMY      There were no vitals filed for this visit.      Subjective Assessment - 01/19/17 1611    Subjective  Pt presents for OT visit 9 to address BLE LE. Pt reports itching is becoming more tolerable. Pt has no new complaints today.   Pertinent History BLE leg swelling (A-G) and associated pain/ discomfort w, onset young adulthood without known precipitating event; HTN, OA, reported B foot and distal leg numbness; chronic back pain; chronic headaches- reported; L hip arthroplasty 2012; L knee replacement 2014; R hip arthroplasty 2016   Limitations BLE pain and swelling, L>R difficulty walking, decreased standing tolerance, difficulty fitting street shoes and LB clothing; lack of BLE sensation contributes to falls risk; chhronic back and joint pain 2/2 OA;    Patient Stated Goals reduce swelling and  pain and keep it from getting worse   Currently in Pain? No/denies   Pain Onset More than a month ago                      OT Treatments/Exercises (OP) - 01/19/17 0001      ADLs   ADL Education Given Yes     Manual Therapy   Manual Therapy Edema management;Manual Lymphatic Drainage (MLD);Compression Bandaging   Manual therapy comments skin care to LLE using Eucerin lotion. No caastor oil today to rule out possible allergic reaction   Manual Lymphatic Drainage (MLD) LLE  MLD as established. NO RLE MLD today 2/2 neuropathy and excessive itching below the knee   Compression Bandaging Resumed LLE wrap as established. DC Artiflex as Pt reports the wraps fell down soon after applying them.                OT Education - 01/19/17 1613    Education provided Yes   Education Details Emphasis of LE self care training today on simple self MLD to BLE   Person(s) Educated Patient   Methods Explanation;Demonstration;Handout   Comprehension Verbalized understanding;Need further instruction             OT Long Term Goals - 12/26/16 1450      OT LONG TERM GOAL #1   Title Pt independent w/ lymphedema precautions and prevention  principals and strategies to  limit LE progression and infection risk.   Baseline dependent   Time 2   Period Weeks   Status New     OT LONG TERM GOAL #2   Title Lymphedema (LE) management/ self-care: Pt able to apply multi layered, gradient compression wraps w Max caregiver assistance using proper techniques within 2 weeks to achieve optimal limb volume reduction.   Baseline dependent   Time 12   Period Weeks   Status New     OT LONG TERM GOAL #3   Title Lymphedema (LE) management/ self-care:  Pt to achieve at least 10% limb volume reductions bilaterally during Intensive CDT to limit LE progression, to reduce pain, and to improve safe ambulation and functional mobility.   Baseline dependent   Time 12   Period Weeks   Status New     OT  LONG TERM GOAL #4   Title Lymphedema (LE) management/ self-care:  Pt to tolerate daily compression wraps, garments and devices in keeping w/ prescribed wear regime within 1 week of issue date to progress and retain clinical and functional gains and to limit LE progression.   Baseline dependent   Time 12   Period Weeks   Status New     OT LONG TERM GOAL #5   Title Lymphedema (LE) management/ self-care:  During Management Phase CDT Pt to sustain current limb volumes within 5%, and all other clinical gains achieved during OT treatment with needed level of caregiver assistance to limit LE progression, infection risk and further functional decline.   Baseline dependent   Time 6   Period Months   Status On-going               Plan - 01/19/17 1615    Clinical Impression Statement Tolerance to Compression continues to improve. Limb volume decreasing slowly and tissue density is palpably decreasing over time. By end of session todsay Pt demonstrated understanding of purpose and techniques, including J Stroke, of simple self-MLD.   OT Frequency 3x / week   OT Duration 12 weeks   OT Treatment/Interventions Self-care/ADL training;Therapeutic exercise;Patient/family education;Manual Therapy;Manual lymph drainage;Other (comment);DME and/or AE instruction;Compression bandaging;Therapeutic activities  skin care w/ low pH lotion and castor oil   Consulted and Agree with Plan of Care Patient      Patient will benefit from skilled therapeutic intervention in order to improve the following deficits and impairments:  Decreased skin integrity, Decreased knowledge of precautions, Decreased activity tolerance, Decreased knowledge of use of DME, Impaired flexibility, Decreased balance, Decreased mobility, Difficulty walking, Impaired sensation, Obesity, Decreased range of motion, Increased edema, Pain, Decreased strength  Visit Diagnosis: Lymphedema, not elsewhere classified    Problem List Patient  Active Problem List   Diagnosis Date Noted  . Total knee replacement status 09/21/2015    Loel Dubonnet, MS, OTR/L, [facility] 01/19/17 4:18 PM  [place] [facility] MAIN [facility] SERVICES [address] [place], [place], [address] Phone: [phone]   Fax:  [phone]  Name: Tamara Dodson MRN: [id number] Date of Birth: 1961-07-26

## 2017-01-25 ENCOUNTER — Ambulatory Visit: Payer: BLUE CROSS/BLUE SHIELD | Admitting: Occupational Therapy

## 2017-01-25 DIAGNOSIS — I890 Lymphedema, not elsewhere classified: Secondary | ICD-10-CM | POA: Diagnosis not present

## 2017-01-25 NOTE — Therapy (Signed)
[facility] MAIN [facility] SERVICES [address] [place], [place], [address] Phone: [phone]   Fax:  [phone]  Occupational Therapy Treatment  Patient Details  Name: Tamara Dodson MRN: [id number] Date of Birth: [birth year] Referring Provider: [name], MD  Encounter Date: 01/25/2017      OT End of Session - 01/25/17 1608    Visit Number 10   Number of Visits 36   Date for OT Re-Evaluation 03/21/17   OT Start Time 0300   OT Stop Time 0400   OT Time Calculation (min) 60 min   Activity Tolerance Patient tolerated treatment well;No increased pain   Behavior During Therapy WFL for tasks assessed/performed      Past Medical History:  Diagnosis Date  . Anemia   . Arthritis   . Hypertension   . Lymph edema    lower extremities    Past Surgical History:  Procedure Laterality Date  . CHOLECYSTECTOMY    . JOINT REPLACEMENT Left    knee, hip  . KNEE ARTHROPLASTY Right 09/21/2015   Procedure: COMPUTER ASSISTED TOTAL KNEE ARTHROPLASTY;  Surgeon: Donato Heinz, MD;  Location: [facility] ORS;  Service: Orthopedics;  Laterality: Right;  . TONSILLECTOMY      There were no vitals filed for this visit.      Subjective Assessment - 01/25/17 1603    Subjective  Pt presents for OT visit 10 to address BLE LE. Pt reporting back pain. "It started a couple of weeks ago and is getting worse. I think that (post op) shoe is making me walk uneven and it's  causing my back to hurt."   Pertinent History BLE leg swelling (A-G) and associated pain/ discomfort w, onset young adulthood without known precipitating event; HTN, OA, reported B foot and distal leg numbness; chronic back pain; chronic headaches- reported; L hip arthroplasty 2012; L knee replacement 2014; R hip arthroplasty 2016   Limitations BLE pain and swelling, L>R difficulty walking, decreased standing tolerance, difficulty fitting street shoes and LB clothing; lack of BLE sensation contributes to  falls risk; chhronic back and joint pain 2/2 OA;    Patient Stated Goals reduce swelling and pain and keep it from getting worse   Currently in Pain? Yes   Pain Location Back   Pain Descriptors / Indicators Aching;Grimacing;Radiating;Sore   Pain Onset 1 to 4 weeks ago                      OT Treatments/Exercises (OP) - 01/25/17 0001      ADLs   ADL Education Given Yes     Manual Therapy   Manual Therapy Edema management;Manual Lymphatic Drainage (MLD);Compression Bandaging   Manual therapy comments skin care to LLE using Eucerin lotion. No caastor oil today to rule out possible allergic reaction   Manual Lymphatic Drainage (MLD) LLE  MLD as established. NO RLE MLD today 2/2 neuropathy and excessive itching below the knee   Compression Bandaging Modified LLE compression wrap by decreasing number of layers on foot. Pt able to get on tennis shoe after treatment.                OT Education - 01/25/17 1607    Education provided Yes   Education Details Continued skilled Pt/caregiver education  And LE ADL training throughout visit for lymphedema self care/ home program, including compression wrapping, compression garment and device wear/care, lymphatic pumping ther ex, simple self-MLD, and skin care. Discussed progress towards goals.  Person(s) Educated Patient   Methods Explanation   Comprehension Verbalized understanding             OT Long Term Goals - 12/26/16 1450      OT LONG TERM GOAL #1   Title Pt independent w/ lymphedema precautions and prevention  principals and strategies to limit LE progression and infection risk.   Baseline dependent   Time 2   Period Weeks   Status New     OT LONG TERM GOAL #2   Title Lymphedema (LE) management/ self-care: Pt able to apply multi layered, gradient compression wraps w Max caregiver assistance using proper techniques within 2 weeks to achieve optimal limb volume reduction.   Baseline dependent   Time 12    Period Weeks   Status New     OT LONG TERM GOAL #3   Title Lymphedema (LE) management/ self-care:  Pt to achieve at least 10% limb volume reductions bilaterally during Intensive CDT to limit LE progression, to reduce pain, and to improve safe ambulation and functional mobility.   Baseline dependent   Time 12   Period Weeks   Status New     OT LONG TERM GOAL #4   Title Lymphedema (LE) management/ self-care:  Pt to tolerate daily compression wraps, garments and devices in keeping w/ prescribed wear regime within 1 week of issue date to progress and retain clinical and functional gains and to limit LE progression.   Baseline dependent   Time 12   Period Weeks   Status New     OT LONG TERM GOAL #5   Title Lymphedema (LE) management/ self-care:  During Management Phase CDT Pt to sustain current limb volumes within 5%, and all other clinical gains achieved during OT treatment with needed level of caregiver assistance to limit LE progression, infection risk and further functional decline.   Baseline dependent   Time 6   Period Months   Status On-going               Plan - 01/25/17 1608    Clinical Impression Statement Pt had difficulty sitting on treatment bed  during MLD 2/2 radiating lower back pain during treatment. Pillow under knees helped to relieve some discomfort. Pt also had several leg muscle spasms during MLD, but denied pain. Muscle spasms disipated after MLD ended, and Pt reported relief   with more even gait while using regular tennis shoes bilaterally today after decreasing bandage layers on foot. Cont POC. LLE lymphedema in this case is very stubborn and limb is slow to respond. Pt is very tenacious and determined.   OT Frequency 3x / week   OT Duration 12 weeks   OT Treatment/Interventions Self-care/ADL training;Therapeutic exercise;Patient/family education;Manual Therapy;Manual lymph drainage;Other (comment);DME and/or AE instruction;Compression bandaging;Therapeutic  activities  skin care w/ low pH lotion and castor oil   Consulted and Agree with Plan of Care Patient      Patient will benefit from skilled therapeutic intervention in order to improve the following deficits and impairments:  Decreased skin integrity, Decreased knowledge of precautions, Decreased activity tolerance, Decreased knowledge of use of DME, Impaired flexibility, Decreased balance, Decreased mobility, Difficulty walking, Impaired sensation, Obesity, Decreased range of motion, Increased edema, Pain, Decreased strength  Visit Diagnosis: Lymphedema, not elsewhere classified    Problem List Patient Active Problem List   Diagnosis Date Noted  . Total knee replacement status 09/21/2015   Loel Dubonnet, MS, OTR/L, CLT-LANA 01/25/17 4:14 PM  [place] [facility]  MAIN [facility] [address] [place], [place], [address] Phone: [phone]   Fax:  [phone]  Name: Tamara Dodson MRN: [id number] Date of Birth: 01/22/1961

## 2017-01-27 ENCOUNTER — Encounter: Payer: BLUE CROSS/BLUE SHIELD | Admitting: Occupational Therapy

## 2017-01-27 ENCOUNTER — Ambulatory Visit: Payer: BLUE CROSS/BLUE SHIELD | Admitting: Occupational Therapy

## 2017-01-27 DIAGNOSIS — I890 Lymphedema, not elsewhere classified: Secondary | ICD-10-CM

## 2017-01-27 NOTE — Therapy (Signed)
[facility] [address] [place], [place], [address] Phone: [phone]   Fax:  [phone]  Occupational Therapy Treatment  Patient Details  Name: Tamara Dodson MRN: [id number] Date of Birth: 10/22/1961 Referring Provider: Jerl MinaJames Hedrick, MD  Encounter Date: 01/27/2017      OT End of Session - 01/27/17 1559    Visit Number 11   Number of Visits 36   Date for OT Re-Evaluation 03/21/17   OT Start Time 0305   OT Stop Time 0355   OT Time Calculation (min) 50 min   Activity Tolerance Patient tolerated treatment well;No increased pain   Behavior During Therapy WFL for tasks assessed/performed      Past Medical History:  Diagnosis Date  . Anemia   . Arthritis   . Hypertension   . Lymph edema    lower extremities    Past Surgical History:  Procedure Laterality Date  . CHOLECYSTECTOMY    . JOINT REPLACEMENT Left    knee, hip  . KNEE ARTHROPLASTY Right 09/21/2015   Procedure: COMPUTER ASSISTED TOTAL KNEE ARTHROPLASTY;  Surgeon: Donato HeinzJames P Hooten, MD;  Location: [facility] ORS;  Service: Orthopedics;  Laterality: Right;  . TONSILLECTOMY      There were no vitals filed for this visit.      Subjective Assessment - 01/27/17 1553    Subjective  Pt presents for OT visit 11 to address BLE LE. Pt reporting decreased back pain with using typical tennis shoe vs post op shoe over compression wraps.   Pertinent History BLE leg swelling (A-G) and associated pain/ discomfort w, onset young adulthood without known precipitating event; HTN, OA, reported B foot and distal leg numbness; chronic back pain; chronic headaches- reported; L hip arthroplasty 2012; L knee replacement 2014; R hip arthroplasty 2016   Limitations BLE pain and swelling, L>R difficulty walking, decreased standing tolerance, difficulty fitting street shoes and LB clothing; lack of BLE sensation contributes to falls risk; chhronic back and joint pain 2/2 OA;    Patient Stated  Goals reduce swelling and pain and keep it from getting worse   Currently in Pain? No/denies                              OT Education - 01/27/17 1558    Education provided Yes   Education Details Continued skilled Pt/caregiver education  And LE ADL training throughout visit for lymphedema self care/ home program, including compression wrapping, compression garment and device wear/care, lymphatic pumping ther ex, simple self-MLD, and skin care. Discussed progress towards goals.   Person(s) Educated Patient   Methods Explanation   Comprehension Verbalized understanding;Need further instruction             OT Long Term Goals - 12/26/16 1450      OT LONG TERM GOAL #1   Title Pt independent w/ lymphedema precautions and prevention  principals and strategies to limit LE progression and infection risk.   Baseline dependent   Time 2   Period Weeks   Status New     OT LONG TERM GOAL #2   Title Lymphedema (LE) management/ self-care: Pt able to apply multi layered, gradient compression wraps w Max caregiver assistance using proper techniques within 2 weeks to achieve optimal limb volume reduction.   Baseline dependent   Time 12   Period Weeks   Status New     OT LONG TERM GOAL #3  Title Lymphedema (LE) management/ self-care:  Pt to achieve at least 10% limb volume reductions bilaterally during Intensive CDT to limit LE progression, to reduce pain, and to improve safe ambulation and functional mobility.   Baseline dependent   Time 12   Period Weeks   Status New     OT LONG TERM GOAL #4   Title Lymphedema (LE) management/ self-care:  Pt to tolerate daily compression wraps, garments and devices in keeping w/ prescribed wear regime within 1 week of issue date to progress and retain clinical and functional gains and to limit LE progression.   Baseline dependent   Time 12   Period Weeks   Status New     OT LONG TERM GOAL #5   Title Lymphedema (LE) management/  self-care:  During Management Phase CDT Pt to sustain current limb volumes within 5%, and all other clinical gains achieved during OT treatment with needed level of caregiver assistance to limit LE progression, infection risk and further functional decline.   Baseline dependent   Time 6   Period Months   Status On-going               Plan - 01/27/17 1600    Clinical Impression Statement Back pain has resolved since eliminating post op shoe to achieve improved postuiral alignment. Pt using typical shoe during waking hours to manage foot compression. Pt tolerating CDT better today. Itching also has improved by report. Cont OT as per POC.   OT Frequency 3x / week   OT Duration 12 weeks   OT Treatment/Interventions Self-care/ADL training;Therapeutic exercise;Patient/family education;Manual Therapy;Manual lymph drainage;Other (comment);DME and/or AE instruction;Compression bandaging;Therapeutic activities  skin care w/ low pH lotion and castor oil   Consulted and Agree with Plan of Care Patient      Patient will benefit from skilled therapeutic intervention in order to improve the following deficits and impairments:  Decreased skin integrity, Decreased knowledge of precautions, Decreased activity tolerance, Decreased knowledge of use of DME, Impaired flexibility, Decreased balance, Decreased mobility, Difficulty walking, Impaired sensation, Obesity, Decreased range of motion, Increased edema, Pain, Decreased strength  Visit Diagnosis: Lymphedema, not elsewhere classified    Problem List Patient Active Problem List   Diagnosis Date Noted  . Total knee replacement status 09/21/2015    Loel Dubonnet, MS, OTR/L, [facility] 01/27/17 4:02 PM  [facility] MAIN [facility] SERVICES [address] [place], [place], [address] Phone: [phone]   Fax:  [phone]  Name: Tamara Dodson MRN: [id number] Date of Birth: 1961-05-10

## 2017-01-30 ENCOUNTER — Ambulatory Visit: Payer: BLUE CROSS/BLUE SHIELD | Admitting: Occupational Therapy

## 2017-01-30 DIAGNOSIS — I890 Lymphedema, not elsewhere classified: Secondary | ICD-10-CM | POA: Diagnosis not present

## 2017-01-30 NOTE — Patient Instructions (Signed)

## 2017-01-30 NOTE — Therapy (Signed)
[facility] MAIN [facility] SERVICES [address] [place], [place], [address] Phone: [phone]   Fax:  [phone]  Occupational Therapy Treatment  Patient Details  Name: Tamara Dodson MRN: [id number] Date of Birth: October 08, 1961 Referring Provider: [name], MD  Encounter Date: 01/30/2017      OT End of Session - 01/30/17 1625    Visit Number 12   Number of Visits 36   Date for OT Re-Evaluation 03/21/17   OT Start Time 0245   OT Stop Time 0345   OT Time Calculation (min) 60 min   Activity Tolerance Patient tolerated treatment well;No increased pain   Behavior During Therapy WFL for tasks assessed/performed      Past Medical History:  Diagnosis Date  . Anemia   . Arthritis   . Hypertension   . Lymph edema    lower extremities    Past Surgical History:  Procedure Laterality Date  . CHOLECYSTECTOMY    . JOINT REPLACEMENT Left    knee, hip  . KNEE ARTHROPLASTY Right 09/21/2015   Procedure: COMPUTER ASSISTED TOTAL KNEE ARTHROPLASTY;  Surgeon: Donato Heinz, MD;  Location: [facility] ORS;  Service: Orthopedics;  Laterality: Right;  . TONSILLECTOMY      There were no vitals filed for this visit.      Subjective Assessment - 01/30/17 1615    Subjective  Pt presents for OT visit 12 for Complete Decongestive Therapy (CDT) to address moderate, stage II, BLE lymphedema. Pt reporting increased leg swelling bilaterally over the weekend, especially distally. Pt reports she is wrapping diliigently during visit interval and following directions from therapist re compression wraps.   Pertinent History BLE leg swelling (A-G) and associated pain/ discomfort w, onset young adulthood without known precipitating event; HTN, OA, reported B foot and distal leg numbness; chronic back pain; chronic headaches- reported; L hip arthroplasty 2012; L knee replacement 2014; R hip arthroplasty 2016   Limitations BLE pain and swelling, L>R difficulty walking,  decreased standing tolerance, difficulty fitting street shoes and LB clothing; lack of BLE sensation contributes to falls risk; chhronic back and joint pain 2/2 OA;    Patient Stated Goals reduce swelling and pain and keep it from getting worse   Currently in Pain? Yes   Pain Score --  not rated numerically today   Pain Location Leg   Pain Orientation Right;Left   Pain Descriptors / Indicators Sore;Numbness;Tender;Tightness;Discomfort;Heaviness   Pain Type Chronic pain   Pain Onset 1 to 4 weeks ago   Pain Frequency Intermittent   Aggravating Factors  dependent positioning, standing, walking, transfers   Pain Relieving Factors elevation, compression   Effect of Pain on Daily Activities Chroninc BLE swelling and associated pain limits basic and instrumental ADL performance, limits participation in productive and leisure activities, limits ability to perform work duties, and limits social participation                      OT Treatments/Exercises (OP) - 01/30/17 0001      ADLs   ADL Education Given Yes     Manual Therapy   Manual Therapy Edema management;Manual Lymphatic Drainage (MLD);Compression Bandaging   Manual therapy comments skin care to LLE using Eucerin lotion. No caastor oil today to rule out possible allergic reaction   Manual Lymphatic Drainage (MLD) LLE  MLD as established. NO RLE MLD today 2/2 neuropathy and excessive itching below the knee   Compression Bandaging Modified LLE compression wrap by decreasing  number of layers on foot. Pt able to get on tennis shoe after treatment.                OT Education - 01/30/17 1621    Education provided Yes   Education Details Emphasis of skilled LE self care training today on anatomy of lymphatic system and correct techniques for simple self MLD. Discussed differential diagnosis   for lymphedema vs systemic swelling. Pt educated regarding difference between Flexitouch sequential pneumatic compression devicxe (  32 chaamber pump) for lymphedema management vis 4 chambered vasopneumatic pump for addressing venous swelling alone.   Person(s) Educated Patient   Methods Explanation;Demonstration;Handout   Comprehension Verbalized understanding;Returned demonstration;Need further instruction             OT Long Term Goals - 12/26/16 1450      OT LONG TERM GOAL #1   Title Pt independent w/ lymphedema precautions and prevention  principals and strategies to limit LE progression and infection risk.   Baseline dependent   Time 2   Period Weeks   Status New     OT LONG TERM GOAL #2   Title Lymphedema (LE) management/ self-care: Pt able to apply multi layered, gradient compression wraps w Max caregiver assistance using proper techniques within 2 weeks to achieve optimal limb volume reduction.   Baseline dependent   Time 12   Period Weeks   Status New     OT LONG TERM GOAL #3   Title Lymphedema (LE) management/ self-care:  Pt to achieve at least 10% limb volume reductions bilaterally during Intensive CDT to limit LE progression, to reduce pain, and to improve safe ambulation and functional mobility.   Baseline dependent   Time 12   Period Weeks   Status New     OT LONG TERM GOAL #4   Title Lymphedema (LE) management/ self-care:  Pt to tolerate daily compression wraps, garments and devices in keeping w/ prescribed wear regime within 1 week of issue date to progress and retain clinical and functional gains and to limit LE progression.   Baseline dependent   Time 12   Period Weeks   Status New     OT LONG TERM GOAL #5   Title Lymphedema (LE) management/ self-care:  During Management Phase CDT Pt to sustain current limb volumes within 5%, and all other clinical gains achieved during OT treatment with needed level of caregiver assistance to limit LE progression, infection risk and further functional decline.   Baseline dependent   Time 6   Period Months   Status On-going                Plan - 01/30/17 1627    Clinical Impression Statement Pt expressed frustration in increased swelling noted today, despite diligent LE self care, including daily compression wrapping from foot to below knee daily. After skilled education Pt demonstrates understanding of differences between systemic swelling vs lymphedmea alone, and how venous swelling, fluid retention and lymphedema impact a person and interact w/ each other.  Pt agrees to keep weight diary for a few weeks to follow possible weight fluctuations. Educated Pt re differences between a 32 chamber Flexitouch sequential pneumatic compression device and a 4 chambered vaso-pneumatic pump, and how Flexitouch simulates MLD for improved mobilization of lymphatic congestion through lymphatic structures towards lymphatic terminus in neck. Pt interested in trial and gave verbal permission to share demographics, insurance info and case info w/ DME vendor. Pt agrees to practice "short neck" sequence for simple  self MLD during visit interval. Cont as per POC.   OT Frequency 3x / week   OT Duration 12 weeks   OT Treatment/Interventions Self-care/ADL training;Therapeutic exercise;Patient/family education;Manual Therapy;Manual lymph drainage;Other (comment);DME and/or AE instruction;Compression bandaging;Therapeutic activities  skin care w/ low pH lotion and castor oil   Consulted and Agree with Plan of Care Patient      Patient will benefit from skilled therapeutic intervention in order to improve the following deficits and impairments:  Decreased skin integrity, Decreased knowledge of precautions, Decreased activity tolerance, Decreased knowledge of use of DME, Impaired flexibility, Decreased balance, Decreased mobility, Difficulty walking, Impaired sensation, Obesity, Decreased range of motion, Increased edema, Pain, Decreased strength  Visit Diagnosis: Lymphedema, not elsewhere classified    Problem List Patient Active Problem List   Diagnosis  Date Noted  . Total knee replacement status 09/21/2015   Loel Dubonnet, MS, OTR/L, [facility] 01/30/17 4:38 PM   [facility] MAIN [facility] SERVICES [address] [place], [place], [address] Phone: [phone]   Fax:  [phone]  Name: Tamara Dodson MRN: [id number] Date of Birth: [birth year]

## 2017-02-01 ENCOUNTER — Ambulatory Visit: Payer: BLUE CROSS/BLUE SHIELD | Admitting: Occupational Therapy

## 2017-02-01 DIAGNOSIS — I890 Lymphedema, not elsewhere classified: Secondary | ICD-10-CM | POA: Diagnosis not present

## 2017-02-01 NOTE — Therapy (Signed)
[facility] [address] [place], [place], [address] Phone: [phone]   Fax:  [phone]  Occupational Therapy Treatment  Patient Details  Name: Noland FordyceRenee L Medders MRN: [id number] Date of Birth: 05/06/1961 Referring Provider: Jerl MinaJames Hedrick, MD  Encounter Date: 02/01/2017      OT End of Session - 02/01/17 1614    Visit Number 13   Number of Visits 36   Date for OT Re-Evaluation 03/21/17   OT Start Time 0316   OT Stop Time 0405   OT Time Calculation (min) 49 min   Activity Tolerance Patient tolerated treatment well;No increased pain   Behavior During Therapy WFL for tasks assessed/performed      Past Medical History:  Diagnosis Date  . Anemia   . Arthritis   . Hypertension   . Lymph edema    lower extremities    Past Surgical History:  Procedure Laterality Date  . CHOLECYSTECTOMY    . JOINT REPLACEMENT Left    knee, hip  . KNEE ARTHROPLASTY Right 09/21/2015   Procedure: COMPUTER ASSISTED TOTAL KNEE ARTHROPLASTY;  Surgeon: Donato HeinzJames P Hooten, MD;  Location: [facility] ORS;  Service: Orthopedics;  Laterality: Right;  . TONSILLECTOMY      There were no vitals filed for this visit.      Subjective Assessment - 02/01/17 1608    Subjective  Pt presents for OT visit 13 for Complete Decongestive Therapy (CDT) to address moderate, stage II, BLE lymphedema. Pt reporting iher wrapped leg has been itching terribly all day.   Pertinent History BLE leg swelling (A-G) and associated pain/ discomfort w, onset young adulthood without known precipitating event; HTN, OA, reported B foot and distal leg numbness; chronic back pain; chronic headaches- reported; L hip arthroplasty 2012; L knee replacement 2014; R hip arthroplasty 2016   Limitations BLE pain and swelling, L>R difficulty walking, decreased standing tolerance, difficulty fitting street shoes and LB clothing; lack of BLE sensation contributes to falls risk; chhronic back and joint  pain 2/2 OA;    Patient Stated Goals reduce swelling and pain and keep it from getting worse   Currently in Pain? Yes  not rated numerically   Pain Location Knee   Pain Orientation Left   Pain Descriptors / Indicators Aching   Pain Onset 1 to 4 weeks ago                      OT Treatments/Exercises (OP) - 02/01/17 0001      ADLs   ADL Education Given Yes     Manual Therapy   Manual Therapy Edema management;Manual Lymphatic Drainage (MLD);Compression Bandaging   Manual therapy comments skin care to LLE using Eucerin lotion. No caastor oil today to rule out possible allergic reaction   Manual Lymphatic Drainage (MLD) LLE  MLD as established. NO RLE MLD today 2/2 neuropathy and excessive itching below the knee   Compression Bandaging Modified LLE compression wrap by decreasing number of layers on foot. Pt able to get on tennis shoe after treatment.                OT Education - 02/01/17 1613    Education provided Yes   Education Details Continued skilled Pt/caregiver education  And LE ADL training throughout visit for lymphedema self care/ home program, including compression wrapping, compression garment and device wear/care, lymphatic pumping ther ex, simple self-MLD, and skin care. Discussed progress towards goals.   Person(s) Educated Patient  Methods Explanation   Comprehension Verbalized understanding             OT Long Term Goals - 12/26/16 1450      OT LONG TERM GOAL #1   Title Pt independent w/ lymphedema precautions and prevention  principals and strategies to limit LE progression and infection risk.   Baseline dependent   Time 2   Period Weeks   Status New     OT LONG TERM GOAL #2   Title Lymphedema (LE) management/ self-care: Pt able to apply multi layered, gradient compression wraps w Max caregiver assistance using proper techniques within 2 weeks to achieve optimal limb volume reduction.   Baseline dependent   Time 12   Period Weeks    Status New     OT LONG TERM GOAL #3   Title Lymphedema (LE) management/ self-care:  Pt to achieve at least 10% limb volume reductions bilaterally during Intensive CDT to limit LE progression, to reduce pain, and to improve safe ambulation and functional mobility.   Baseline dependent   Time 12   Period Weeks   Status New     OT LONG TERM GOAL #4   Title Lymphedema (LE) management/ self-care:  Pt to tolerate daily compression wraps, garments and devices in keeping w/ prescribed wear regime within 1 week of issue date to progress and retain clinical and functional gains and to limit LE progression.   Baseline dependent   Time 12   Period Weeks   Status New     OT LONG TERM GOAL #5   Title Lymphedema (LE) management/ self-care:  During Management Phase CDT Pt to sustain current limb volumes within 5%, and all other clinical gains achieved during OT treatment with needed level of caregiver assistance to limit LE progression, infection risk and further functional decline.   Baseline dependent   Time 6   Period Months   Status On-going               Plan - 02/01/17 1616    Clinical Impression Statement Pt tolerated MLD, skin care and compression wraps without difficulty today. Lim volume below knee on L appears visibly more reduced today. Tissue density remains  full and tight, but skin continues to increase in mobility throughout session. Cont as per POC.   OT Frequency 3x / week   OT Duration 12 weeks   OT Treatment/Interventions Self-care/ADL training;Therapeutic exercise;Patient/family education;Manual Therapy;Manual lymph drainage;Other (comment);DME and/or AE instruction;Compression bandaging;Therapeutic activities  skin care w/ low pH lotion and castor oil   Consulted and Agree with Plan of Care Patient      Patient will benefit from skilled therapeutic intervention in order to improve the following deficits and impairments:  Decreased skin integrity, Decreased knowledge  of precautions, Decreased activity tolerance, Decreased knowledge of use of DME, Impaired flexibility, Decreased balance, Decreased mobility, Difficulty walking, Impaired sensation, Obesity, Decreased range of motion, Increased edema, Pain, Decreased strength  Visit Diagnosis: Lymphedema, not elsewhere classified    Problem List Patient Active Problem List   Diagnosis Date Noted  . Total knee replacement status 09/21/2015    Loel Dubonnet, MS, OTR/L, [facility] 02/01/17 4:19 PM  [facility] MAIN [facility] SERVICES [address] [place], [place], [address] Phone: [phone]   Fax:  [phone]  Name: JESTINE BICKNELL MRN: [id number] Date of Birth: July 10, 1961

## 2017-02-03 ENCOUNTER — Encounter: Payer: BLUE CROSS/BLUE SHIELD | Admitting: Occupational Therapy

## 2017-02-03 ENCOUNTER — Ambulatory Visit: Payer: BLUE CROSS/BLUE SHIELD | Admitting: Occupational Therapy

## 2017-02-03 DIAGNOSIS — I890 Lymphedema, not elsewhere classified: Secondary | ICD-10-CM

## 2017-02-03 NOTE — Therapy (Signed)
[facility] [address] [place], [place], [address] Phone: [phone]   Fax:  [phone]  Occupational Therapy Treatment  Patient Details  Name: Tamara FordyceRenee L Darrington MRN: [id number] Date of Birth: 07/11/1961 Referring Provider: Jerl MinaJames Hedrick, MD  Encounter Date: 02/03/2017      OT End of Session - 02/03/17 1623    Visit Number 14   Number of Visits 36   Date for OT Re-Evaluation 03/21/17   OT Start Time 0305   OT Stop Time 0405   OT Time Calculation (min) 60 min   Activity Tolerance Patient tolerated treatment well;No increased pain   Behavior During Therapy WFL for tasks assessed/performed      Past Medical History:  Diagnosis Date  . Anemia   . Arthritis   . Hypertension   . Lymph edema    lower extremities    Past Surgical History:  Procedure Laterality Date  . CHOLECYSTECTOMY    . JOINT REPLACEMENT Left    knee, hip  . KNEE ARTHROPLASTY Right 09/21/2015   Procedure: COMPUTER ASSISTED TOTAL KNEE ARTHROPLASTY;  Surgeon: Donato HeinzJames P Hooten, MD;  Location: [facility] ORS;  Service: Orthopedics;  Laterality: Right;  . TONSILLECTOMY      There were no vitals filed for this visit.      Subjective Assessment - 02/03/17 1616    Subjective  Pt presents for OT visit 13 for Complete Decongestive Therapy (CDT) to address moderate, stage II, BLE lymphedema. Pt has no new complaints today.   Pertinent History BLE leg swelling (A-G) and associated pain/ discomfort w, onset young adulthood without known precipitating event; HTN, OA, reported B foot and distal leg numbness; chronic back pain; chronic headaches- reported; L hip arthroplasty 2012; L knee replacement 2014; R hip arthroplasty 2016   Limitations BLE pain and swelling, L>R difficulty walking, decreased standing tolerance, difficulty fitting street shoes and LB clothing; lack of BLE sensation contributes to falls risk; chhronic back and joint pain 2/2 OA;    Patient Stated  Goals reduce swelling and pain and keep it from getting worse   Currently in Pain? No/denies   Pain Onset 1 to 4 weeks ago                      OT Treatments/Exercises (OP) - 02/03/17 0001      ADLs   ADL Education Given Yes     Manual Therapy   Manual Therapy Edema management;Manual Lymphatic Drainage (MLD);Compression Bandaging   Manual therapy comments skin care to LLE using Eucerin lotion. No caastor oil today to rule out possible allergic reaction   Edema Management completed BLE comparative lim volumetrics, A-D only   Manual Lymphatic Drainage (MLD) LLE  MLD as established. NO RLE MLD today 2/2 neuropathy and excessive itching below the knee   Compression Bandaging Modified LLE compression wrap by decreasing number of layers on foot. Pt able to get on tennis shoe after treatment.                OT Education - 02/03/17 1620    Education provided Yes   Education Details Continued skilled Pt/caregiver education  And LE ADL training throughout visit for lymphedema self care/ home program, including compression wrapping, compression garment and device wear/care, lymphatic pumping ther ex, simple self-MLD, and skin care. Discussed progress towards goals.   Person(s) Educated Patient   Methods Explanation   Comprehension Verbalized understanding  OT Long Term Goals - 12/26/16 1450      OT LONG TERM GOAL #1   Title Pt independent w/ lymphedema precautions and prevention  principals and strategies to limit LE progression and infection risk.   Baseline dependent   Time 2   Period Weeks   Status New     OT LONG TERM GOAL #2   Title Lymphedema (LE) management/ self-care: Pt able to apply multi layered, gradient compression wraps w Max caregiver assistance using proper techniques within 2 weeks to achieve optimal limb volume reduction.   Baseline dependent   Time 12   Period Weeks   Status New     OT LONG TERM GOAL #3   Title Lymphedema  (LE) management/ self-care:  Pt to achieve at least 10% limb volume reductions bilaterally during Intensive CDT to limit LE progression, to reduce pain, and to improve safe ambulation and functional mobility.   Baseline dependent   Time 12   Period Weeks   Status New     OT LONG TERM GOAL #4   Title Lymphedema (LE) management/ self-care:  Pt to tolerate daily compression wraps, garments and devices in keeping w/ prescribed wear regime within 1 week of issue date to progress and retain clinical and functional gains and to limit LE progression.   Baseline dependent   Time 12   Period Weeks   Status New     OT LONG TERM GOAL #5   Title Lymphedema (LE) management/ self-care:  During Management Phase CDT Pt to sustain current limb volumes within 5%, and all other clinical gains achieved during OT treatment with needed level of caregiver assistance to limit LE progression, infection risk and further functional decline.   Baseline dependent   Time 6   Period Months   Status On-going               Plan - 02/03/17 1624    Clinical Impression Statement Pt demonstrates steady progress towards all OT goals. Limb volume on LLE continues to decrease and tissue density is palpably softening with each treatment.. Pt is diligent we LE self care program between visits and is fully enggaged in clinical appointments. Cont as per POC.   OT Frequency 3x / week   OT Duration 12 weeks   OT Treatment/Interventions Self-care/ADL training;Therapeutic exercise;Patient/family education;Manual Therapy;Manual lymph drainage;Other (comment);DME and/or AE instruction;Compression bandaging;Therapeutic activities  skin care w/ low pH lotion and castor oil   Consulted and Agree with Plan of Care Patient      Patient will benefit from skilled therapeutic intervention in order to improve the following deficits and impairments:  Decreased skin integrity, Decreased knowledge of precautions, Decreased activity  tolerance, Decreased knowledge of use of DME, Impaired flexibility, Decreased balance, Decreased mobility, Difficulty walking, Impaired sensation, Obesity, Decreased range of motion, Increased edema, Pain, Decreased strength, Increased fascial restricitons  Visit Diagnosis: Lymphedema, not elsewhere classified    Problem List Patient Active Problem List   Diagnosis Date Noted  . Total knee replacement status 09/21/2015    Loel Dubonnet, MS, OTR/L, [facility] 02/03/17 4:29 PM   [facility] MAIN [facility] SERVICES [address] [place], [place], [address] Phone: [phone]   Fax:  [phone]  Name: MARJI KUEHNEL MRN: [id number] Date of Birth: 12-Oct-1961

## 2017-02-06 ENCOUNTER — Ambulatory Visit: Payer: BLUE CROSS/BLUE SHIELD | Admitting: Occupational Therapy

## 2017-02-06 DIAGNOSIS — I890 Lymphedema, not elsewhere classified: Secondary | ICD-10-CM | POA: Diagnosis not present

## 2017-02-06 NOTE — Therapy (Signed)
[facility] [address] [place], [place], [address] Phone: [phone]   Fax:  [phone]  Occupational Therapy Treatment  Patient Details  Name: Tamara Dodson MRN: [id number] Date of Birth: 09/30/1961 Referring Provider: Jerl MinaJames Hedrick, MD  Encounter Date: 02/06/2017      OT End of Session - 02/06/17 1613    Visit Number 15   Number of Visits 36   Date for OT Re-Evaluation 03/21/17   OT Start Time 0305   OT Stop Time 0405   OT Time Calculation (min) 60 min   Activity Tolerance Patient tolerated treatment well;No increased pain   Behavior During Therapy WFL for tasks assessed/performed      Past Medical History:  Diagnosis Date  . Anemia   . Arthritis   . Hypertension   . Lymph edema    lower extremities    Past Surgical History:  Procedure Laterality Date  . CHOLECYSTECTOMY    . JOINT REPLACEMENT Left    knee, hip  . KNEE ARTHROPLASTY Right 09/21/2015   Procedure: COMPUTER ASSISTED TOTAL KNEE ARTHROPLASTY;  Surgeon: Donato HeinzJames P Hooten, MD;  Location: [facility] ORS;  Service: Orthopedics;  Laterality: Right;  . TONSILLECTOMY      There were no vitals filed for this visit.      Subjective Assessment - 02/06/17 1600    Subjective  Pt presents for OT visit 14 for Complete Decongestive Therapy (CDT) to address moderate, stage II, BLE lymphedema. Pt c/o dramatic increase in RLE swelling below knee and in foot over the weekend with no known exacerbating factors. Pt agrees to trying BLE compression.   Pertinent History BLE leg swelling (A-G) and associated pain/ discomfort w, onset young adulthood without known precipitating event; HTN, OA, reported B foot and distal leg numbness; chronic back pain; chronic headaches- reported; L hip arthroplasty 2012; L knee replacement 2014; R hip arthroplasty 2016   Limitations BLE pain and swelling, L>R difficulty walking, decreased standing tolerance, difficulty fitting street shoes  and LB clothing; lack of BLE sensation contributes to falls risk; chhronic back and joint pain 2/2 OA;    Patient Stated Goals reduce swelling and pain and keep it from getting worse   Pain Onset 1 to 4 weeks ago             LYMPHEDEMA/ONCOLOGY QUESTIONNAIRE - 02/06/17 1606      Right Lower Extremity Lymphedema   Other RLE below-the-knee (A-D) limb volume =  10295.76 ml.    Other RLE A-D limb volume is increased by 25.76% since last measured on 01/13/17.% Since initially measured on 12/23/16 RE has lost all clinical volume reductions and is now slightly larger than initial starting volume.   Other A-D Limb volume differential (LVD)= 25.21%, R>L     Left Lower Extremity Lymphedema   Other LLE below-the-knee (A-D) limb volume = 8288.77 ml ml.   Other A-D limb volume is decreaded by 13.02% since initially measured on 01/13/17. LLW A-D voliume is decreaased by 20.54% overall since commencing OT for CDT on 12/23/16.                 OT Treatments/Exercises (OP) - 02/06/17 0001      ADLs   ADL Education Given Yes     Manual Therapy   Manual Therapy Edema management;Compression Bandaging   Edema Management completed BLE comparative lim volumetrics, A-D only   Compression Bandaging BLE compression wraps applied   from foot to below knee (A-D)  today in effort to controll RLE exacerbation. Pt verbaloized understanding of instructions to remove wraps with unusual SOB, pain, swelling                     OT Long Term Goals - 12/26/16 1450      OT LONG TERM GOAL #1   Title Pt independent w/ lymphedema precautions and prevention  principals and strategies to limit LE progression and infection risk.   Baseline dependent   Time 2   Period Weeks   Status New     OT LONG TERM GOAL #2   Title Lymphedema (LE) management/ self-care: Pt able to apply multi layered, gradient compression wraps w Max caregiver assistance using proper techniques within 2 weeks to achieve optimal  limb volume reduction.   Baseline dependent   Time 12   Period Weeks   Status New     OT LONG TERM GOAL #3   Title Lymphedema (LE) management/ self-care:  Pt to achieve at least 10% limb volume reductions bilaterally during Intensive CDT to limit LE progression, to reduce pain, and to improve safe ambulation and functional mobility.   Baseline dependent   Time 12   Period Weeks   Status New     OT LONG TERM GOAL #4   Title Lymphedema (LE) management/ self-care:  Pt to tolerate daily compression wraps, garments and devices in keeping w/ prescribed wear regime within 1 week of issue date to progress and retain clinical and functional gains and to limit LE progression.   Baseline dependent   Time 12   Period Weeks   Status New     OT LONG TERM GOAL #5   Title Lymphedema (LE) management/ self-care:  During Management Phase CDT Pt to sustain current limb volumes within 5%, and all other clinical gains achieved during OT treatment with needed level of caregiver assistance to limit LE progression, infection risk and further functional decline.   Baseline dependent   Time 6   Period Months   Status On-going               Plan - 02/06/17 1613    Clinical Impression Statement BLE comparative limb volumetrics reveal LLE below the knee (A-D) limb volume is decreaded by 13.02% since last measured on 01/13/17, and LLE A-D voliume is decreaased by 20.54% overall since commencing OT for CDT on 12/23/16.  Volumetrics also reveal a dramatic 25.76% limb volume increase on the R since last measured.  RLE has lost all volumetric reductions compared to initial measurements and is now slightly larger volumetriucally than it was when we commenced CDT. Both legs appear to boomerang volumetrically when compression is left off   for even a short period of time. Today we applied BLE knee length compression wraps on a trial basis in an effort to alleviate RLE exacerbation. Reviewed all LE precautions and Pt  verbalized understanding. Cont as per POC. Flexitouch tial scheduled for 02/08/17.   OT Frequency 3x / week   OT Duration 12 weeks   OT Treatment/Interventions Self-care/ADL training;Therapeutic exercise;Patient/family education;Manual Therapy;Manual lymph drainage;Other (comment);DME and/or AE instruction;Compression bandaging;Therapeutic activities  skin care w/ low pH lotion and castor oil   Consulted and Agree with Plan of Care Patient      Patient will benefit from skilled therapeutic intervention in order to improve the following deficits and impairments:  Decreased skin integrity, Decreased knowledge of precautions, Decreased activity tolerance, Decreased knowledge of use of DME, Impaired flexibility, Decreased balance,  Decreased mobility, Difficulty walking, Impaired sensation, Obesity, Decreased range of motion, Increased edema, Pain, Decreased strength, Increased fascial restricitons  Visit Diagnosis: Lymphedema, not elsewhere classified    Problem List Patient Active Problem List   Diagnosis Date Noted  . Total knee replacement status 09/21/2015    Loel Dubonnet, MS, OTR/L, [facility] 02/06/17 4:22 PM   [facility] MAIN [facility] SERVICES [address] [place], [place], [address] Phone: [phone]   Fax:  [phone]  Name: Tamara Dodson MRN: [id number] Date of Birth: [birth year]

## 2017-02-06 NOTE — Patient Instructions (Signed)
02/06/17 Remove all compression wraps immediately if you experience any atypical SOB, leg pain or sudden increase in swelling, and call your doctor immediately.  Lymphedema Precautions  If you experience atypical shortness of breath, or notice any signs /symptoms of skin infection (aka cellulitis) remove all compression wraps/ garments, discontinue manual lymphatic drainage (MLD), and report symptoms to your physician immediately. Discontinue MLD and compression for 72 hours after you take your first oral antibiotic so not to spread the infection.   Lymphedema Self- Care Instructions  1. EXERCISE: Perform lymphatic pumping there ex 2 x a day. While wearing your compression wraps or garments. Perform 10 reps of each exercise bilaterally and be sure to perform them in order. Don;t skip around!  OMIT PARTIAL SIT UP  2. MLD: Perform simple self-Manual Lymphatic Drainage (MLD) at least once a day as directed.  3. WRAPS: Compression wraps are to be worn 23 hrs/ 7 days/wk during Intensive Phase of Complete Decongestive Therapy (CDT).Building tolerance may take time and practice, so don't get discouraged. If bandages begin to feel tight during periods of inactivity and/or during the night, try performing your exercises to loosen them.   4. GARMENTS: During Management Phase CDT your compression garments are to be worn during waking hours when active. Do NOT sleep in your garments!!   5. PUT YOUR FEET UP! Elevate your feet and legs and feet to the level of your heart whenever you are sitting down.   6. SKIN: Carefully monitor skin condition and perform impeccable hygiene daily. Bathe skin with mild soap and water and apply low pH lotion (aka Eucerin ) to improve hydration and limit infection risk.    Lymphatic Pumping Exercises:

## 2017-02-06 NOTE — Therapy (Signed)
[facility] [address] [place], [place], [address] Phone: [phone]   Fax:  [phone]  Patient Details  Name: Noland FordyceRenee L Tagliaferro MRN: [id number] Date of Birth: 01/24/1961 Referring Provider:  Jerl MinaHedrick, James, MD  Encounter Date: 02/06/2017   Loel Dubonnetheresa Nico Syme, MS, OTR/L, [facility] 02/06/17 4:21 PM  [facility] MAIN [facility] [address] [place], [place], [address] Phone: [phone]   Fax:  [phone]

## 2017-02-08 ENCOUNTER — Ambulatory Visit: Payer: BLUE CROSS/BLUE SHIELD | Admitting: Occupational Therapy

## 2017-02-08 DIAGNOSIS — I890 Lymphedema, not elsewhere classified: Secondary | ICD-10-CM | POA: Diagnosis not present

## 2017-02-08 NOTE — Therapy (Signed)
[facility] MAIN [facility] SERVICES [address] [place], [place], [address] Phone: [phone]   Fax:  [phone]  Occupational Therapy Treatment  Patient Details  Name: Tamara Dodson MRN: [id number] Date of Birth: 1961/11/22 Referring Provider: [name], MD  Encounter Date: 02/08/2017      OT End of Session - 02/08/17 1635    Visit Number 16   Number of Visits 36   Date for OT Re-Evaluation 03/21/17   OT Start Time 0308   OT Stop Time 0415   OT Time Calculation (min) 67 min   Equipment Utilized During Treatment Flexitouch sequential pneumatic compression device- trial for LLE   Activity Tolerance Patient tolerated treatment well;No increased pain   Behavior During Therapy WFL for tasks assessed/performed      Past Medical History:  Diagnosis Date  . Anemia   . Arthritis   . Hypertension   . Lymph edema    lower extremities    Past Surgical History:  Procedure Laterality Date  . CHOLECYSTECTOMY    . JOINT REPLACEMENT Left    knee, hip  . KNEE ARTHROPLASTY Right 09/21/2015   Procedure: COMPUTER ASSISTED TOTAL KNEE ARTHROPLASTY;  Surgeon: Donato Heinz, MD;  Location: [facility] ORS;  Service: Orthopedics;  Laterality: Right;  . TONSILLECTOMY      There were no vitals filed for this visit.      Subjective Assessment - 02/08/17 1550    Subjective  Pt presents for OT visit 15 for Complete Decongestive Therapy (CDT) to address moderate, stage II, BLE lymphedema. DME vendor from [employer] here for Flexitouch trial during scheduled appointment.    Pertinent History BLE leg swelling (A-G) and associated pain/ discomfort w, onset young adulthood without known precipitating event; HTN, OA, reported B foot and distal leg numbness; chronic back pain; chronic headaches- reported; L hip arthroplasty 2012; L knee replacement 2014; R hip arthroplasty 2016   Limitations BLE pain and swelling, L>R difficulty walking, decreased standing  tolerance, difficulty fitting street shoes and LB clothing; lack of BLE sensation contributes to falls risk; chhronic back and joint pain 2/2 OA;    Patient Stated Goals reduce swelling and pain and keep it from getting worse   Currently in Pain? No/denies   Pain Onset 1 to 4 weeks ago                      OT Treatments/Exercises (OP) - 02/08/17 0001      ADLs   ADL Education Given Yes     Manual Therapy   Manual Therapy Edema management;Compression Bandaging   Edema Management LLE circumferential   measurements: PRE TRIAL: B=35.5; C= 60 cm; E= 63 cm; G= 88cm. POST   trial: B=35.5; C=59; E= 62.5; G=87 cm.   Compression Bandaging LLE knee length compression wraps only today by Pt request.                OT Education - 02/08/17 1615    Education provided Yes   Education Details Pt edu for rational for sequential pneumatic compression device that simulates MLD vs 4 chambered pump providing retrograde pumping against back ressue in the limb and abdomen., Pt edu included indications and contraindications, care routines, and pre and post trial volumetric results.   Person(s) Educated Patient   Methods Explanation;Demonstration;Handout   Comprehension Verbalized understanding             OT Long Term Goals - 12/26/16 1450  OT LONG TERM GOAL #1   Title Pt independent w/ lymphedema precautions and prevention  principals and strategies to limit LE progression and infection risk.   Baseline dependent   Time 2   Period Weeks   Status New     OT LONG TERM GOAL #2   Title Lymphedema (LE) management/ self-care: Pt able to apply multi layered, gradient compression wraps w Max caregiver assistance using proper techniques within 2 weeks to achieve optimal limb volume reduction.   Baseline dependent   Time 12   Period Weeks   Status New     OT LONG TERM GOAL #3   Title Lymphedema (LE) management/ self-care:  Pt to achieve at least 10% limb volume reductions  bilaterally during Intensive CDT to limit LE progression, to reduce pain, and to improve safe ambulation and functional mobility.   Baseline dependent   Time 12   Period Weeks   Status New     OT LONG TERM GOAL #4   Title Lymphedema (LE) management/ self-care:  Pt to tolerate daily compression wraps, garments and devices in keeping w/ prescribed wear regime within 1 week of issue date to progress and retain clinical and functional gains and to limit LE progression.   Baseline dependent   Time 12   Period Weeks   Status New     OT LONG TERM GOAL #5   Title Lymphedema (LE) management/ self-care:  During Management Phase CDT Pt to sustain current limb volumes within 5%, and all other clinical gains achieved during OT treatment with needed level of caregiver assistance to limit LE progression, infection risk and further functional decline.   Baseline dependent   Time 6   Period Months   Status On-going               Plan - 02/08/17 1632    Clinical Impression Statement Pt reported excessive RLE when wrapped since last visit. Skin today is dry and flaking and pink, which is indicative of a histamine reaction. "PRE Pt tolerated full 1 hr. Fexitouch trial with 30-40 mmHg without difficulty. She denied increased joint and/or back pain and experienced no shortness of breath. PRE and POST trial circumferential measurements reveal no change at the ankle, 1 cm volume reduction at calf, 0.5 cm reduction at knee, and 1 cm reduction at G.  Despite her tenacity and ongoing 100% compliance with all components of lymphedema self-care home program during this Intensive Phase of CDT, tissue density at lower legs and ankles continues to be very dense and immobile. [place] edema also fluctuating on daily basis. Pt will benefit from daily use of the Flexitouch sequential pneumatic compression device to assist her w/ sustaining optimal long term lymphedema self-management routines to reduce infection risk, to  limit lymphedema progression, and to limit further functional decline over time. Cont as per POC.   OT Frequency 3x / week   OT Duration 12 weeks   OT Treatment/Interventions Self-care/ADL training;Therapeutic exercise;Patient/family education;Manual Therapy;Manual lymph drainage;Other (comment);DME and/or AE instruction;Compression bandaging;Therapeutic activities  skin care w/ low pH lotion and castor oil   Consulted and Agree with Plan of Care Patient      Patient will benefit from skilled therapeutic intervention in order to improve the following deficits and impairments:  Decreased skin integrity, Decreased knowledge of precautions, Decreased activity tolerance, Decreased knowledge of use of DME, Impaired flexibility, Decreased balance, Decreased mobility, Difficulty walking, Impaired sensation, Obesity, Decreased range of motion, Increased edema, Pain, Decreased strength, Increased fascial restricitons  Visit Diagnosis: Lymphedema, not elsewhere classified    Problem List Patient Active Problem List   Diagnosis Date Noted  . Total knee replacement status 09/21/2015    Loel Dubonnetheresa Klinton Candelas, MS, OTR/L, [facility] 02/08/17 4:38 PM   [facility] MAIN [facility] [address] [place], [place], [address] Phone: [phone]   Fax:  [phone]  Name: Tamara Dodson MRN: [id number] Date of Birth: 12/12/1961

## 2017-02-10 ENCOUNTER — Ambulatory Visit: Payer: BLUE CROSS/BLUE SHIELD | Admitting: Occupational Therapy

## 2017-02-10 ENCOUNTER — Encounter: Payer: BLUE CROSS/BLUE SHIELD | Admitting: Occupational Therapy

## 2017-02-10 DIAGNOSIS — I890 Lymphedema, not elsewhere classified: Secondary | ICD-10-CM

## 2017-02-10 NOTE — Therapy (Signed)
[place] [facility] MAIN [facility] SERVICES [address] [place], [place], [address] Phone: [phone]   Fax:  [phone]  Occupational Therapy Treatment  Patient Details  Name: Tamara Dodson MRN: [id number] Date of Birth: [birth year] Referring Provider: [name], MD  Encounter Date: 02/10/2017      OT End of Session - 02/10/17 1646    Visit Number 17   Number of Visits 36   Date for OT Re-Evaluation 03/21/17   OT Start Time 0300   OT Stop Time 0404   OT Time Calculation (min) 64 min   Activity Tolerance Patient tolerated treatment well;No increased pain   Behavior During Therapy WFL for tasks assessed/performed      Past Medical History:  Diagnosis Date  . Anemia   . Arthritis   . Hypertension   . Lymph edema    lower extremities    Past Surgical History:  Procedure Laterality Date  . CHOLECYSTECTOMY    . JOINT REPLACEMENT Left    knee, hip  . KNEE ARTHROPLASTY Right 09/21/2015   Procedure: COMPUTER ASSISTED TOTAL KNEE ARTHROPLASTY;  Surgeon: Donato Heinz, MD;  Location: [facility] ORS;  Service: Orthopedics;  Laterality: Right;  . TONSILLECTOMY      There were no vitals filed for this visit.      Subjective Assessment - 02/10/17 1641    Subjective  Pt presents for OT visit 15 for Complete Decongestive Therapy (CDT) to address moderate, stage II, BLE lymphedema. Pt denies any adverse effects following Flexitouch Trial last visit. Pt reports increased cramping in L foot and distal leg when wrapped.   Pertinent History BLE leg swelling (A-G) and associated pain/ discomfort w, onset young adulthood without known precipitating event; HTN, OA, reported B foot and distal leg numbness; chronic back pain; chronic headaches- reported; L hip arthroplasty 2012; L knee replacement 2014; R hip arthroplasty 2016   Limitations BLE pain and swelling, L>R difficulty walking, decreased standing tolerance, difficulty fitting street shoes and LB clothing;  lack of BLE sensation contributes to falls risk; chhronic back and joint pain 2/2 OA;    Patient Stated Goals reduce swelling and pain and keep it from getting worse   Currently in Pain? Yes  no numerical rating   Pain Location Abdomen   Pain Descriptors / Indicators Other (Comment)  Nausea   Pain Type Acute pain   Pain Radiating Towards stomach cramps and nausea onset soon after eating fast food for lunch   Pain Onset Today                      OT Treatments/Exercises (OP) - 02/10/17 0001      ADLs   ADL Education Given Yes     Manual Therapy   Manual Therapy Edema management;Compression Bandaging   Manual therapy comments skin care to LLE using low pH  caastor oil during MLD   Manual Lymphatic Drainage (MLD) LLE  MLD as established. NO RLE MLD today 2/2 neuropathy and excessive itching below the knee   Compression Bandaging LLE knee length compression wraps only today by Pt request.                OT Education - 02/10/17 1646    Education provided Yes   Education Details Continued skilled Pt/caregiver education  And LE ADL training throughout visit for lymphedema self care/ home program, including compression wrapping, compression garment and device wear/care, lymphatic pumping ther ex, simple self-MLD, and skin care.  Discussed progress towards goals.   Person(s) Educated Patient   Methods Explanation   Comprehension Verbalized understanding;Need further instruction             OT Long Term Goals - 12/26/16 1450      OT LONG TERM GOAL #1   Title Pt independent w/ lymphedema precautions and prevention  principals and strategies to limit LE progression and infection risk.   Baseline dependent   Time 2   Period Weeks   Status New     OT LONG TERM GOAL #2   Title Lymphedema (LE) management/ self-care: Pt able to apply multi layered, gradient compression wraps w Max caregiver assistance using proper techniques within 2 weeks to achieve optimal limb  volume reduction.   Baseline dependent   Time 12   Period Weeks   Status New     OT LONG TERM GOAL #3   Title Lymphedema (LE) management/ self-care:  Pt to achieve at least 10% limb volume reductions bilaterally during Intensive CDT to limit LE progression, to reduce pain, and to improve safe ambulation and functional mobility.   Baseline dependent   Time 12   Period Weeks   Status New     OT LONG TERM GOAL #4   Title Lymphedema (LE) management/ self-care:  Pt to tolerate daily compression wraps, garments and devices in keeping w/ prescribed wear regime within 1 week of issue date to progress and retain clinical and functional gains and to limit LE progression.   Baseline dependent   Time 12   Period Weeks   Status New     OT LONG TERM GOAL #5   Title Lymphedema (LE) management/ self-care:  During Management Phase CDT Pt to sustain current limb volumes within 5%, and all other clinical gains achieved during OT treatment with needed level of caregiver assistance to limit LE progression, infection risk and further functional decline.   Baseline dependent   Time 6   Period Months   Status On-going               Plan - 02/10/17 1647    Clinical Impression Statement Pt tolerated all aspects of OT today for CDT, including BLE MLD, skin care and knee length compression wrapping to LLE. Pt continues to experience sensory discomfort on a regular basis since commencing wrapping, which is most likely related to neuropathic pain. Despite difficulty with itching, cramping and histamine reaction on an ongoing basis, Pt continues to tenaciously perform all aspects of LE self care   during visit intervals.. Prior to this course of OT for CDT Pt had not     been able to tolerate compression garments. She has been able to tolerate compression wrapping to L and RLE since commencing on  12/21/2016 despite discomfort. Pt obtained 4 chamber Lympha Press vasopneumatic pump last year in 2/17, and used it  3-4 x weekly until her leg swellling had increased so much that sleeves no longer fit. Pt also reports that she was unable to close zipper closures on vinal sleeves without assistance. She discontinued use of LymphaPress just before commencing this course of OT for CDT.   Rehab Potential Good   OT Frequency 3x / week   OT Duration 12 weeks   OT Treatment/Interventions Self-care/ADL training;Therapeutic exercise;Patient/family education;Manual Therapy;Manual lymph drainage;Other (comment);DME and/or AE instruction;Compression bandaging;Therapeutic activities  skin care w/ low pH lotion and castor oil   Consulted and Agree with Plan of Care Patient      Patient will  benefit from skilled therapeutic intervention in order to improve the following deficits and impairments:  Decreased skin integrity, Decreased knowledge of precautions, Decreased activity tolerance, Decreased knowledge of use of DME, Impaired flexibility, Decreased balance, Decreased mobility, Difficulty walking, Impaired sensation, Obesity, Decreased range of motion, Increased edema, Pain, Decreased strength, Increased fascial restricitons  Visit Diagnosis: Lymphedema, not elsewhere classified    Problem List Patient Active Problem List   Diagnosis Date Noted  . Total knee replacement status 09/21/2015    Tamara Dubonnetheresa Illianna Paschal, Tamara Dodson, Tamara Dodson, Tamara Dodson 02/10/17 4:57 PM   [place] [facility] [address] [place], [place], [address] Phone: [phone]   Fax:  [phone]  Name: Tamara Dodson MRN: [id number] Date of Birth: 08/31/1961

## 2017-02-10 NOTE — Patient Instructions (Signed)

## 2017-02-13 ENCOUNTER — Ambulatory Visit: Payer: BLUE CROSS/BLUE SHIELD | Admitting: Occupational Therapy

## 2017-02-13 DIAGNOSIS — I890 Lymphedema, not elsewhere classified: Secondary | ICD-10-CM | POA: Diagnosis not present

## 2017-02-13 NOTE — Therapy (Signed)
[place] [facility] [address] , [place], [address] Phone: [phone]   Fax:  [phone]  Occupational Therapy Treatment  Patient Details  Name: Tamara Dodson MRN: [id number] Date of Birth: 05/05/1961 Referring Provider: Jerl MinaJames Hedrick, MD  Encounter Date: 02/13/2017      OT End of Session - 02/13/17 1701    Visit Number 18   Number of Visits 36   Date for OT Re-Evaluation 03/21/17   OT Start Time 0245   OT Stop Time 0348   OT Time Calculation (min) 63 min   Activity Tolerance Patient tolerated treatment well;No increased pain   Behavior During Therapy WFL for tasks assessed/performed      Past Medical History:  Diagnosis Date  . Anemia   . Arthritis   . Hypertension   . Lymph edema    lower extremities    Past Surgical History:  Procedure Laterality Date  . CHOLECYSTECTOMY    . JOINT REPLACEMENT Left    knee, hip  . KNEE ARTHROPLASTY Right 09/21/2015   Procedure: COMPUTER ASSISTED TOTAL KNEE ARTHROPLASTY;  Surgeon: Donato HeinzJames P Hooten, MD;  Location: [facility] ORS;  Service: Orthopedics;  Laterality: Right;  . TONSILLECTOMY      There were no vitals filed for this visit.      Subjective Assessment - 02/13/17 1659    Subjective  Pt presents for OT visit 18 for Complete Decongestive Therapy (CDT) to address moderate, stage II, BLE lymphedema. Pt reporting cramping in feet on dorsal surface.   Pertinent History BLE leg swelling (A-G) and associated pain/ discomfort w, onset young adulthood without known precipitating event; HTN, OA, reported B foot and distal leg numbness; chronic back pain; chronic headaches- reported; L hip arthroplasty 2012; L knee replacement 2014; R hip arthroplasty 2016   Limitations BLE pain and swelling, L>R difficulty walking, decreased standing tolerance, difficulty fitting street shoes and LB clothing; lack of BLE sensation contributes to falls risk; chhronic back and joint pain 2/2 OA;    Patient Stated Goals reduce swelling and pain and keep it from getting worse   Currently in Pain? Yes  not rated numerically   Pain Location Foot   Pain Orientation Right;Left   Pain Descriptors / Indicators Cramping   Pain Type Neuropathic pain   Pain Onset Today   Pain Frequency Intermittent                              OT Education - 02/13/17 1701    Education provided Yes   Education Details Continued skilled Pt/caregiver education  And LE ADL training throughout visit for lymphedema self care/ home program, including compression wrapping, compression garment and device wear/care, lymphatic pumping ther ex, simple self-MLD, and skin care. Discussed progress towards goals.   Person(s) Educated Patient   Methods Explanation   Comprehension Verbalized understanding             OT Long Term Goals - 12/26/16 1450      OT LONG TERM GOAL #1   Title Pt independent w/ lymphedema precautions and prevention  principals and strategies to limit LE progression and infection risk.   Baseline dependent   Time 2   Period Weeks   Status New     OT LONG TERM GOAL #2   Title Lymphedema (LE) management/ self-care: Pt able to apply multi layered, gradient compression wraps w Max caregiver assistance using proper techniques within  2 weeks to achieve optimal limb volume reduction.   Baseline dependent   Time 12   Period Weeks   Status New     OT LONG TERM GOAL #3   Title Lymphedema (LE) management/ self-care:  Pt to achieve at least 10% limb volume reductions bilaterally during Intensive CDT to limit LE progression, to reduce pain, and to improve safe ambulation and functional mobility.   Baseline dependent   Time 12   Period Weeks   Status New     OT LONG TERM GOAL #4   Title Lymphedema (LE) management/ self-care:  Pt to tolerate daily compression wraps, garments and devices in keeping w/ prescribed wear regime within 1 week of issue date to progress and retain  clinical and functional gains and to limit LE progression.   Baseline dependent   Time 12   Period Weeks   Status New     OT LONG TERM GOAL #5   Title Lymphedema (LE) management/ self-care:  During Management Phase CDT Pt to sustain current limb volumes within 5%, and all other clinical gains achieved during OT treatment with needed level of caregiver assistance to limit LE progression, infection risk and further functional decline.   Baseline dependent   Time 6   Period Months   Status On-going               Plan - 02/13/17 1702    Clinical Impression Statement LLE continues to respond slowly, but steadily, to CDT. Pt continues to have neuropathic pain in B feet and legs daily. Despite this she has been tenacious w/ attendance and self care. Pt  able to tolerate LLE MLD, skin care and compression in clinic today w/out increased pain. Cont as per POC.   Rehab Potential Good   OT Frequency 3x / week   OT Duration 12 weeks   OT Treatment/Interventions Self-care/ADL training;Therapeutic exercise;Patient/family education;Manual Therapy;Manual lymph drainage;Other (comment);DME and/or AE instruction;Compression bandaging;Therapeutic activities  skin care w/ low pH lotion and castor oil   Consulted and Agree with Plan of Care Patient      Patient will benefit from skilled therapeutic intervention in order to improve the following deficits and impairments:  Decreased skin integrity, Decreased knowledge of precautions, Decreased activity tolerance, Decreased knowledge of use of DME, Impaired flexibility, Decreased balance, Decreased mobility, Difficulty walking, Impaired sensation, Obesity, Decreased range of motion, Increased edema, Pain, Decreased strength, Increased fascial restricitons  Visit Diagnosis: Lymphedema, not elsewhere classified    Problem List Patient Active Problem List   Diagnosis Date Noted  . Total knee replacement status 09/21/2015    Loel Dubonnet, MS,  OTR/L, [facility] 02/13/17 5:06 PM  [place] [facility] MAIN [facility] SERVICES [address] [place], [place], [address] Phone: [phone]   Fax:  [phone]  Name: Tamara Dodson MRN: [id number] Date of Birth: 1961/11/05

## 2017-02-15 ENCOUNTER — Ambulatory Visit: Payer: BLUE CROSS/BLUE SHIELD | Admitting: Occupational Therapy

## 2017-02-15 DIAGNOSIS — I890 Lymphedema, not elsewhere classified: Secondary | ICD-10-CM

## 2017-02-15 NOTE — Therapy (Signed)
[place] [facility] MAIN [facility] SERVICES [address] [place], [place], [address] Phone: [phone]   Fax:  [phone]  Occupational Therapy Treatment  Patient Details  Name: Tamara Dodson MRN: [id number] Date of Birth: [birth year] Referring Provider: [name], MD  Encounter Date: 02/15/2017      OT End of Session - 02/15/17 1654    Visit Number 19   Number of Visits 36   Date for OT Re-Evaluation 03/21/17   OT Start Time 0245   OT Stop Time 0400   OT Time Calculation (min) 75 min   Activity Tolerance Patient tolerated treatment well;No increased pain;Patient limited by pain   Behavior During Therapy [facility] for tasks assessed/performed      Past Medical History:  Diagnosis Date  . Anemia   . Arthritis   . Hypertension   . Lymph edema    lower extremities    Past Surgical History:  Procedure Laterality Date  . CHOLECYSTECTOMY    . JOINT REPLACEMENT Left    knee, hip  . KNEE ARTHROPLASTY Right 09/21/2015   Procedure: COMPUTER ASSISTED TOTAL KNEE ARTHROPLASTY;  Surgeon: Donato Heinz, MD;  Location: [facility] ORS;  Service: Orthopedics;  Laterality: Right;  . TONSILLECTOMY      There were no vitals filed for this visit.      Subjective Assessment - 02/15/17 1455    Subjective  Pt presents for OT visit 19 for Complete Decongestive Therapy (CDT) to address moderate, stage II, BLE lymphedema. Pt c/o 8-10/10 R hip pain getting progressively worse over the course of the week. Pt suspects hip replacement is pending in this joint.   Pertinent History BLE leg swelling (A-G) and associated pain/ discomfort w, onset young adulthood without known precipitating event; HTN, OA, reported B foot and distal leg numbness; chronic back pain; chronic headaches- reported; L hip arthroplasty 2012; L knee replacement 2014; R hip arthroplasty 2016   Limitations BLE pain and swelling, L>R difficulty walking, decreased standing tolerance, difficulty fitting street  shoes and LB clothing; lack of BLE sensation contributes to falls risk; chhronic back and joint pain 2/2 OA;    Patient Stated Goals reduce swelling and pain and keep it from getting worse   Currently in Pain? Yes   Pain Score 8    Pain Location Hip   Pain Orientation Right   Pain Descriptors / Indicators Aching;Pressure;Throbbing;Constant;Restless;Sharp;Discomfort;Sore;Penetrating;Grimacing;Stabbing   Pain Type Acute pain   Pain Onset In the past 7 days   Pain Frequency Constant   Aggravating Factors  all movement   Pain Relieving Factors meds   Multiple Pain Sites Yes                      OT Treatments/Exercises (OP) - 02/15/17 0001      ADLs   ADL Education Given Yes     Manual Therapy   Manual Therapy Edema management;Compression Bandaging   Manual therapy comments skin care to LLE using low pH  caastor oil during MLD   Manual Lymphatic Drainage (MLD) LLE  MLD as established. NO RLE MLD today 2/2 neuropathy and excessive itching below the knee   Compression Bandaging LLE knee length compression wraps only today by Pt request.                OT Education - 02/15/17 1653    Education provided Yes   Education Details Continued skilled Pt/caregiver education  And LE ADL training throughout visit for lymphedema self  care/ home program, including compression wrapping, compression garment and device wear/care, lymphatic pumping ther ex, simple self-MLD, and skin care. Discussed progress towards goals.   Person(s) Educated Patient   Methods Explanation;Demonstration   Comprehension Verbalized understanding             OT Long Term Goals - 12/26/16 1450      OT LONG TERM GOAL #1   Title Pt independent w/ lymphedema precautions and prevention  principals and strategies to limit LE progression and infection risk.   Baseline dependent   Time 2   Period Weeks   Status New     OT LONG TERM GOAL #2   Title Lymphedema (LE) management/ self-care: Pt able  to apply multi layered, gradient compression wraps w Max caregiver assistance using proper techniques within 2 weeks to achieve optimal limb volume reduction.   Baseline dependent   Time 12   Period Weeks   Status New     OT LONG TERM GOAL #3   Title Lymphedema (LE) management/ self-care:  Pt to achieve at least 10% limb volume reductions bilaterally during Intensive CDT to limit LE progression, to reduce pain, and to improve safe ambulation and functional mobility.   Baseline dependent   Time 12   Period Weeks   Status New     OT LONG TERM GOAL #4   Title Lymphedema (LE) management/ self-care:  Pt to tolerate daily compression wraps, garments and devices in keeping w/ prescribed wear regime within 1 week of issue date to progress and retain clinical and functional gains and to limit LE progression.   Baseline dependent   Time 12   Period Weeks   Status New     OT LONG TERM GOAL #5   Title Lymphedema (LE) management/ self-care:  During Management Phase CDT Pt to sustain current limb volumes within 5%, and all other clinical gains achieved during OT treatment with needed level of caregiver assistance to limit LE progression, infection risk and further functional decline.   Baseline dependent   Time 6   Period Months   Status On-going               Plan - 02/15/17 1654    Clinical Impression Statement Pt in increased R hip pain today that is interferring w/ sleep , work duties, ambulation, and functional transfers. Pt able to tolerate OT session in long sitting w/ back support on treatment table with minimal difficulty. LLE continues to decrease in   tissue density and volume. Plan to complete volumetrics on Friday to check progress towards goals.   Rehab Potential Good   OT Frequency 3x / week   OT Duration 12 weeks   OT Treatment/Interventions Self-care/ADL training;Therapeutic exercise;Patient/family education;Manual Therapy;Manual lymph drainage;Other (comment);DME and/or  AE instruction;Compression bandaging;Therapeutic activities  skin care w/ low pH lotion and castor oil   Consulted and Agree with Plan of Care Patient      Patient will benefit from skilled therapeutic intervention in order to improve the following deficits and impairments:  Decreased skin integrity, Decreased knowledge of precautions, Decreased activity tolerance, Decreased knowledge of use of DME, Impaired flexibility, Decreased balance, Decreased mobility, Difficulty walking, Impaired sensation, Obesity, Decreased range of motion, Increased edema, Pain, Decreased strength, Increased fascial restricitons  Visit Diagnosis: Lymphedema, not elsewhere classified    Problem List Patient Active Problem List   Diagnosis Date Noted  . Total knee replacement status 09/21/2015    Loel Dubonnet, MS, OTR/L, [facility] 02/15/17 4:58 PM  [place] [facility] [address] [place], [place], [address] Phone: [phone]   Fax:  [phone]  Name: Tamara FordyceRenee L Dodson MRN: [id number] Date of Birth: 08/11/1961

## 2017-02-17 ENCOUNTER — Ambulatory Visit: Payer: BLUE CROSS/BLUE SHIELD | Attending: Family Medicine | Admitting: Occupational Therapy

## 2017-02-17 ENCOUNTER — Encounter: Payer: BLUE CROSS/BLUE SHIELD | Admitting: Occupational Therapy

## 2017-02-17 DIAGNOSIS — I890 Lymphedema, not elsewhere classified: Secondary | ICD-10-CM | POA: Insufficient documentation

## 2017-02-17 NOTE — Therapy (Signed)
[place] MAIN [facility] SERVICES [address] [place], [place], [address] Phone: [phone]   Fax:  [phone]  Occupational Therapy Treatment Note and Progress Report  Patient Details  Name: Tamara Dodson MRN: [id number] Date of Birth: 05/11/1961 Referring Provider: [name], MD  Encounter Date: 02/17/2017      OT End of Session - 02/17/17 1607    Visit Number 20   Number of Visits 36   Date for OT Re-Evaluation 03/21/17   OT Start Time 0305   OT Stop Time 0404   OT Time Calculation (min) 59 min   Activity Tolerance Patient tolerated treatment well;No increased pain;Patient limited by pain   Behavior During Therapy [facility] for tasks assessed/performed      Past Medical History:  Diagnosis Date  . Anemia   . Arthritis   . Hypertension   . Lymph edema    lower extremities    Past Surgical History:  Procedure Laterality Date  . CHOLECYSTECTOMY    . JOINT REPLACEMENT Left    knee, hip  . KNEE ARTHROPLASTY Right 09/21/2015   Procedure: COMPUTER ASSISTED TOTAL KNEE ARTHROPLASTY;  Surgeon: Dereck Leep, MD;  Location: [facility] ORS;  Service: Orthopedics;  Laterality: Right;  . TONSILLECTOMY      There were no vitals filed for this visit.      Subjective Assessment - 02/17/17 1601    Subjective  Pt presents for OT visit 20 for Complete Decongestive Therapy (CDT) to address moderate, stage II, BLE lymphedema. Pt c/o 8-10/10 R hip pain getting progressively worse over the course of the week. Pt suspects hip replacement is pending in this joint.   Pertinent History BLE leg swelling (A-G) and associated pain/ discomfort w, onset young adulthood without known precipitating event; HTN, OA, reported B foot and distal leg numbness; chronic back pain; chronic headaches- reported; L hip arthroplasty 2012; L knee replacement 2014; R hip arthroplasty 2016   Limitations BLE pain and swelling, L>R difficulty walking, decreased standing tolerance,  difficulty fitting street shoes and LB clothing; lack of BLE sensation contributes to falls risk; chhronic back and joint pain 2/2 OA;    Patient Stated Goals reduce swelling and pain and keep it from getting worse   Currently in Pain? Yes   Pain Score 5    Pain Location Hip   Pain Orientation Right   Pain Type Acute pain   Pain Onset 1 to 4 weeks ago   Aggravating Factors  sitting or standing for extended period of time   Pain Relieving Factors flexion/ extension   Effect of Pain on Daily Activities limits ambulation and functional mobility needed for ADLs, productive activities and social participation                      OT Treatments/Exercises (OP) - 02/17/17 0001      ADLs   ADL Education Given Yes     Manual Therapy   Manual Therapy Edema management;Compression Bandaging   Manual therapy comments skin care to LLE using low pH  caastor oil during MLD   Manual Lymphatic Drainage (MLD) LLE  MLD as established. NO RLE MLD today 2/2 neuropathy and excessive itching below the knee   Compression Bandaging LLE knee length compression wraps only today by Pt request.                OT Education - 02/17/17 1607    Education provided Yes   Education Details  Continued skilled Pt/caregiver education  And LE ADL training throughout visit for lymphedema self care/ home program, including compression wrapping, compression garment and device wear/care, lymphatic pumping ther ex, simple self-MLD, and skin care. Discussed progress towards goals.   Person(s) Educated Patient   Methods Explanation;Demonstration   Comprehension Verbalized understanding             OT Long Term Goals - 02/17/17 1613      OT LONG TERM GOAL #1   Title Pt independent w/ lymphedema precautions and prevention  principals and strategies to limit LE progression and infection risk.   Baseline dependent   Time 2   Period Weeks   Status Achieved     OT LONG TERM GOAL #2   Title  Lymphedema (LE) management/ self-care: Pt able to apply multi layered, gradient compression wraps w Max caregiver assistance using proper techniques within 2 weeks to achieve optimal limb volume reduction.   Baseline dependent   Time 12   Period Weeks   Status Achieved     OT LONG TERM GOAL #3   Title Lymphedema (LE) management/ self-care:  Pt to achieve at least 10% limb volume reductions bilaterally during Intensive CDT to limit LE progression, to reduce pain, and to improve safe ambulation and functional mobility.   Baseline dependent   Time 12   Period Weeks   Status Partially Met     OT LONG TERM GOAL #4   Title Lymphedema (LE) management/ self-care:  Pt to tolerate daily compression wraps, garments and devices in keeping w/ prescribed wear regime within 1 week of issue date to progress and retain clinical and functional gains and to limit LE progression.   Baseline dependent   Time 12   Period Weeks   Status Partially Met     OT LONG TERM GOAL #5   Title Lymphedema (LE) management/ self-care:  During Management Phase CDT Pt to sustain current limb volumes within 5%, and all other clinical gains achieved during OT treatment with needed level of caregiver assistance to limit LE progression, infection risk and further functional decline.   Baseline dependent   Time 6   Period Months   Status On-going               Plan - 02/17/17 1608    Clinical Impression Statement Pt continues to make slow, but steady progress towards OT goals for LE care to LLE. Itching has decreased w/ LLE compression last few days. Pt consistently applying compression wrapping between visits as directed. LLE limb volume and tissue density continue to improve. Will checlk volumetrics next week to determine reduction data . Cint as per POC. Expedite LLE treatment as much as possible to shift Rx to RLE as Pt expects hip replacement  surgery needed in the near future. due to worsening pain.   Rehab  Potential Good   OT Frequency 3x / week   OT Duration 12 weeks   OT Treatment/Interventions Self-care/ADL training;Therapeutic exercise;Patient/family education;Manual Therapy;Manual lymph drainage;Other (comment);DME and/or AE instruction;Compression bandaging;Therapeutic activities  skin care w/ low pH lotion and castor oil   Consulted and Agree with Plan of Care Patient      Patient will benefit from skilled therapeutic intervention in order to improve the following deficits and impairments:  Decreased skin integrity, Decreased knowledge of precautions, Decreased activity tolerance, Decreased knowledge of use of DME, Impaired flexibility, Decreased balance, Decreased mobility, Difficulty walking, Impaired sensation, Obesity, Decreased range of motion, Increased edema, Pain, Decreased strength, Increased  fascial restricitons  Visit Diagnosis: Lymphedema, not elsewhere classified - Plan: Ot plan of care cert/re-cert    Problem List Patient Active Problem List   Diagnosis Date Noted  . Total knee replacement status 09/21/2015    Andrey Spearman, MS, OTR/L, [facility] 02/17/17 4:22 PM   [place] MAIN [facility] SERVICES [address] [place], [place], [address] Phone: [phone]   Fax:  [phone]  Name: Tamara Dodson MRN: [id number] Date of Birth: 12-Jun-1961

## 2017-02-20 ENCOUNTER — Ambulatory Visit: Payer: BLUE CROSS/BLUE SHIELD | Admitting: Occupational Therapy

## 2017-02-20 DIAGNOSIS — I890 Lymphedema, not elsewhere classified: Secondary | ICD-10-CM

## 2017-02-20 NOTE — Therapy (Signed)
[place] MAIN [facility] SERVICES [address] [place], [place], [address] Phone: [phone]   Fax:  [phone]  Occupational Therapy Treatment  Patient Details  Name: Tamara Dodson MRN: [id number] Date of Birth: [birth year] Referring Provider: [name], MD  Encounter Date: 02/20/2017      OT End of Session - 02/20/17 1641    Visit Number 21   Number of Visits 36   Date for OT Re-Evaluation 03/21/17   OT Start Time 0310   OT Stop Time 0410   OT Time Calculation (min) 60 min   Activity Tolerance Patient tolerated treatment well;No increased pain;Patient limited by pain   Behavior During Therapy [facility] for tasks assessed/performed      Past Medical History:  Diagnosis Date  . Anemia   . Arthritis   . Hypertension   . Lymph edema    lower extremities    Past Surgical History:  Procedure Laterality Date  . CHOLECYSTECTOMY    . JOINT REPLACEMENT Left    knee, hip  . KNEE ARTHROPLASTY Right 09/21/2015   Procedure: COMPUTER ASSISTED TOTAL KNEE ARTHROPLASTY;  Surgeon: Dereck Leep, MD;  Location: [facility] ORS;  Service: Orthopedics;  Laterality: Right;  . TONSILLECTOMY      There were no vitals filed for this visit.      Subjective Assessment - 02/20/17 1634    Subjective  Pt presents for OT visit 21 for Complete Decongestive Therapy (CDT) to address moderate, stage II, BLE lymphedema. Pt c/o LLE itching after removing compression wrap.   Pertinent History BLE leg swelling (A-G) and associated pain/ discomfort w, onset young adulthood without known precipitating event; HTN, OA, reported B foot and distal leg numbness; chronic back pain; chronic headaches- reported; L hip arthroplasty 2012; L knee replacement 2014; R hip arthroplasty 2016   Limitations BLE pain and swelling, L>R difficulty walking, decreased standing tolerance, difficulty fitting street shoes and LB clothing; lack of BLE sensation contributes to falls risk; chhronic back  and joint pain 2/2 OA;    Patient Stated Goals reduce swelling and pain and keep it from getting worse   Currently in Pain? No/denies   Pain Onset 1 to 4 weeks ago             LYMPHEDEMA/ONCOLOGY QUESTIONNAIRE - 02/20/17 1635      Left Lower Extremity Lymphedema   Other LLE below-the-knee (A-D) limb volume = 8255.02 ml.   Other A-D limb volume is decreaded additionally by 0.3897% since last measured on 02/06/17. Essentially LLE limb volume is stable since last measured and  A-D voliume is decreaased by 20.54% overall since commencing OT for CDT                  OT Treatments/Exercises (OP) - 02/20/17 0001      ADLs   ADL Education Given Yes     Manual Therapy   Manual Therapy Edema management;Compression Bandaging;Manual Lymphatic Drainage (MLD)   Manual therapy comments skin care to LLE using low pH  caastor oil during MLD   Edema Management LLE comparative limb volumetrics   Manual Lymphatic Drainage (MLD) LLE  MLD as established. NO RLE MLD today 2/2 neuropathy and excessive itching below the knee   Compression Bandaging LLE knee length compression wraps only today by Pt request.                OT Education - 02/20/17 [place]    Education provided Yes   Education Details  Pt edu for convoluted foam  boots designed to improve lymphatic circulation and soften fibrosis during HOS. Showed 4 exa,ples, [employer], [employer], [employer], and [employer].   Person(s) Educated Patient   Methods Explanation;Demonstration   Comprehension Verbalized understanding             OT Long Term Goals - 02/17/17 1613      OT LONG TERM GOAL #1   Title Pt independent w/ lymphedema precautions and prevention  principals and strategies to limit LE progression and infection risk.   Baseline dependent   Time 2   Period Weeks   Status Achieved     OT LONG TERM GOAL #2   Title Lymphedema (LE) management/ self-care: Pt able to apply multi layered, gradient  compression wraps w Max caregiver assistance using proper techniques within 2 weeks to achieve optimal limb volume reduction.   Baseline dependent   Time 12   Period Weeks   Status Achieved     OT LONG TERM GOAL #3   Title Lymphedema (LE) management/ self-care:  Pt to achieve at least 10% limb volume reductions bilaterally during Intensive CDT to limit LE progression, to reduce pain, and to improve safe ambulation and functional mobility.   Baseline dependent   Time 12   Period Weeks   Status Partially Met     OT LONG TERM GOAL #4   Title Lymphedema (LE) management/ self-care:  Pt to tolerate daily compression wraps, garments and devices in keeping w/ prescribed wear regime within 1 week of issue date to progress and retain clinical and functional gains and to limit LE progression.   Baseline dependent   Time 12   Period Weeks   Status Partially Met     OT LONG TERM GOAL #5   Title Lymphedema (LE) management/ self-care:  During Management Phase CDT Pt to sustain current limb volumes within 5%, and all other clinical gains achieved during OT treatment with needed level of caregiver assistance to limit LE progression, infection risk and further functional decline.   Baseline dependent   Time 6   Period Months   Status On-going               Plan - 02/20/17 1641    Clinical Impression Statement A-D limb volume is decreaded additionally by 0.3897% since last measured on 02/06/17. Essentially LLE limb volume is stable since last measured and  A-D voliume is decreaased by 20.54% overall since commencing OT for CDT. Will check LLE limb volumetrics at end of week to confirm stability. If volume is stable again, we'l complete LLE compression garment and HOS device measurements and submit to DME vendor for estimate and order. Pt in agreement  w/ this plan.   Rehab Potential Good   OT Frequency 3x / week   OT Duration 12 weeks   OT Treatment/Interventions Self-care/ADL  training;Therapeutic exercise;Patient/family education;Manual Therapy;Manual lymph drainage;Other (comment);DME and/or AE instruction;Compression bandaging;Therapeutic activities  skin care w/ low pH lotion and castor oil   Consulted and Agree with Plan of Care Patient      Patient will benefit from skilled therapeutic intervention in order to improve the following deficits and impairments:  Decreased skin integrity, Decreased knowledge of precautions, Decreased activity tolerance, Decreased knowledge of use of DME, Impaired flexibility, Decreased balance, Decreased mobility, Difficulty walking, Impaired sensation, Obesity, Decreased range of motion, Increased edema, Pain, Decreased strength, Increased fascial restricitons  Visit Diagnosis: Lymphedema, not elsewhere classified    Problem List Patient Active  Problem List   Diagnosis Date Noted  . Total knee replacement status 09/21/2015    Andrey Spearman, MS, OTR/L, [facility] 02/20/17 4:44 PM   [place] MAIN [facility] SERVICES [address] [place], [place], [address] Phone: [phone]   Fax:  [phone]  Name: Tamara Dodson MRN: [id number] Date of Birth: 1961/07/14

## 2017-02-22 ENCOUNTER — Ambulatory Visit: Payer: BLUE CROSS/BLUE SHIELD | Admitting: Occupational Therapy

## 2017-02-22 DIAGNOSIS — I890 Lymphedema, not elsewhere classified: Secondary | ICD-10-CM

## 2017-02-22 NOTE — Therapy (Signed)
[place] MAIN [facility] SERVICES [address] [place], [place], [address] Phone: [phone]   Fax:  [phone]  Occupational Therapy Treatment  Patient Details  Name: Tamara Dodson MRN: [id number] Date of Birth: 1961/06/16 Referring Provider: [name], MD  Encounter Date: 02/22/2017      OT End of Session - 02/22/17 1603    Visit Number 22   Number of Visits 36   Date for OT Re-Evaluation 03/21/17   OT Start Time 0245   OT Stop Time 0358   OT Time Calculation (min) 73 min   Activity Tolerance Patient tolerated treatment well;No increased pain;Patient limited by pain   Behavior During Therapy [facility] for tasks assessed/performed      Past Medical History:  Diagnosis Date  . Anemia   . Arthritis   . Hypertension   . Lymph edema    lower extremities    Past Surgical History:  Procedure Laterality Date  . CHOLECYSTECTOMY    . JOINT REPLACEMENT Left    knee, hip  . KNEE ARTHROPLASTY Right 09/21/2015   Procedure: COMPUTER ASSISTED TOTAL KNEE ARTHROPLASTY;  Surgeon: Dereck Leep, MD;  Location: [facility] ORS;  Service: Orthopedics;  Laterality: Right;  . TONSILLECTOMY      There were no vitals filed for this visit.      Subjective Assessment - 02/22/17 1559    Subjective  Pt presents for OT visit 22 for Complete Decongestive Therapy (CDT) to address moderate, stage II, BLE lymphedema. Discussed pros and cons of each HOS device demo'd at last visit. Pt's preferred device is not available in her size, Pt reports that lately she hasn't been sleeping in wraps.    Pertinent History BLE leg swelling (A-G) and associated pain/ discomfort w, onset young adulthood without known precipitating event; HTN, OA, reported B foot and distal leg numbness; chronic back pain; chronic headaches- reported; L hip arthroplasty 2012; L knee replacement 2014; R hip arthroplasty 2016   Limitations BLE pain and swelling, L>R difficulty walking, decreased standing  tolerance, difficulty fitting street shoes and LB clothing; lack of BLE sensation contributes to falls risk; chhronic back and joint pain 2/2 OA;    Patient Stated Goals reduce swelling and pain and keep it from getting worse   Currently in Pain? Yes   Pain Score --  pain not rated   Pain Location Back   Pain Type Chronic pain   Pain Onset 1 to 4 weeks ago                      OT Treatments/Exercises (OP) - 02/22/17 0001      ADLs   ADL Education Given Yes     Manual Therapy   Manual Therapy Edema management;Compression Bandaging;Manual Lymphatic Drainage (MLD)   Manual therapy comments skin care to LLE using low pH  caastor oil during MLD   Edema Management LLE comparative limb volumetrics   Manual Lymphatic Drainage (MLD) LLE  MLD as established. NO RLE MLD today 2/2 neuropathy and excessive itching below the knee   Compression Bandaging LLE knee length compression wraps only today by Pt request.                OT Education - 02/22/17 1601    Education provided Yes   Education Details Continued skilled Pt/caregiver education  And LE ADL training throughout visit for lymphedema self care/ home program, including compression wrapping, compression garment and device wear/care, lymphatic pumping ther  ex, simple self-MLD, and skin care. Discussed progress towards goals.   Person(s) Educated Patient   Methods Explanation   Comprehension Verbalized understanding             OT Long Term Goals - 02/17/17 1613      OT LONG TERM GOAL #1   Title Pt independent w/ lymphedema precautions and prevention  principals and strategies to limit LE progression and infection risk.   Baseline dependent   Time 2   Period Weeks   Status Achieved     OT LONG TERM GOAL #2   Title Lymphedema (LE) management/ self-care: Pt able to apply multi layered, gradient compression wraps w Max caregiver assistance using proper techniques within 2 weeks to achieve optimal limb volume  reduction.   Baseline dependent   Time 12   Period Weeks   Status Achieved     OT LONG TERM GOAL #3   Title Lymphedema (LE) management/ self-care:  Pt to achieve at least 10% limb volume reductions bilaterally during Intensive CDT to limit LE progression, to reduce pain, and to improve safe ambulation and functional mobility.   Baseline dependent   Time 12   Period Weeks   Status Partially Met     OT LONG TERM GOAL #4   Title Lymphedema (LE) management/ self-care:  Pt to tolerate daily compression wraps, garments and devices in keeping w/ prescribed wear regime within 1 week of issue date to progress and retain clinical and functional gains and to limit LE progression.   Baseline dependent   Time 12   Period Weeks   Status Partially Met     OT LONG TERM GOAL #5   Title Lymphedema (LE) management/ self-care:  During Management Phase CDT Pt to sustain current limb volumes within 5%, and all other clinical gains achieved during OT treatment with needed level of caregiver assistance to limit LE progression, infection risk and further functional decline.   Baseline dependent   Time 6   Period Months   Status On-going               Plan - 02/22/17 1603    Clinical Impression Statement LLE continues to decrease volumetrically and skin mobility is much improved. All aspects of clinical treatment tolerated today, despite chronic pain and sensory discomfort. Pt demos slight decrease in LE self care compliance  as she has admittedly abandoned compression wrapping in the evening after her shower. Reinforced clinical reasoning for HOS to limit fibrosis formation. Pt agrees with plan to complete volumetric measurements this coming Monday.   Rehab Potential Good   OT Frequency 3x / week   OT Duration 12 weeks   OT Treatment/Interventions Self-care/ADL training;Therapeutic exercise;Patient/family education;Manual Therapy;Manual lymph drainage;Other (comment);DME and/or AE  instruction;Compression bandaging;Therapeutic activities  skin care w/ low pH lotion and castor oil   Consulted and Agree with Plan of Care Patient      Patient will benefit from skilled therapeutic intervention in order to improve the following deficits and impairments:  Decreased skin integrity, Decreased knowledge of precautions, Decreased activity tolerance, Decreased knowledge of use of DME, Impaired flexibility, Decreased balance, Decreased mobility, Difficulty walking, Impaired sensation, Obesity, Decreased range of motion, Increased edema, Pain, Decreased strength, Increased fascial restricitons  Visit Diagnosis: Lymphedema, not elsewhere classified    Problem List Patient Active Problem List   Diagnosis Date Noted  . Total knee replacement status 09/21/2015   Andrey Spearman, MS, OTR/L, [facility] 02/22/17 4:08 PM    [place] MAIN [facility] SERVICES [place], [place], [address] Phone: [phone]   Fax:  [phone]  Name: ZHANNA MELIN MRN: [id number] Date of Birth: 1961-08-02

## 2017-02-24 ENCOUNTER — Ambulatory Visit: Payer: BLUE CROSS/BLUE SHIELD | Admitting: Occupational Therapy

## 2017-02-24 DIAGNOSIS — I890 Lymphedema, not elsewhere classified: Secondary | ICD-10-CM | POA: Diagnosis not present

## 2017-02-24 NOTE — Therapy (Signed)
[place] MAIN [facility] SERVICES [address] [place], [place], [address] Phone: [phone]   Fax:  [phone]  Occupational Therapy Treatment  Patient Details  Name: Tamara Dodson MRN: [id number] Date of Birth: 11-07-1961 Referring Provider: [name], MD  Encounter Date: 02/24/2017      OT End of Session - 02/24/17 1719    Visit Number 23   Number of Visits 36   Date for OT Re-Evaluation 03/21/17   OT Start Time 0310   OT Stop Time 0405   OT Time Calculation (min) 55 min   Activity Tolerance Patient tolerated treatment well;No increased pain;Patient limited by pain   Behavior During Therapy [facility] for tasks assessed/performed      Past Medical History:  Diagnosis Date  . Anemia   . Arthritis   . Hypertension   . Lymph edema    lower extremities    Past Surgical History:  Procedure Laterality Date  . CHOLECYSTECTOMY    . JOINT REPLACEMENT Left    knee, hip  . KNEE ARTHROPLASTY Right 09/21/2015   Procedure: COMPUTER ASSISTED TOTAL KNEE ARTHROPLASTY;  Surgeon: Dereck Leep, MD;  Location: [facility] ORS;  Service: Orthopedics;  Laterality: Right;  . TONSILLECTOMY      There were no vitals filed for this visit.      Subjective Assessment - 02/24/17 1720    Subjective  Pt presents for OT visit 23 for Complete Decongestive Therapy (CDT) to address moderate, stage II, BLE lymphedema. Pt considering HOS device choices since her first   choice is not available in an off the shelf size that will fit her.    Pertinent History BLE leg swelling (A-G) and associated pain/ discomfort w, onset young adulthood without known precipitating event; HTN, OA, reported B foot and distal leg numbness; chronic back pain; chronic headaches- reported; L hip arthroplasty 2012; L knee replacement 2014; R hip arthroplasty 2016   Limitations BLE pain and swelling, L>R difficulty walking, decreased standing tolerance, difficulty fitting street shoes and LB  clothing; lack of BLE sensation contributes to falls risk; chhronic back and joint pain 2/2 OA;    Patient Stated Goals reduce swelling and pain and keep it from getting worse   Currently in Pain? No/denies   Pain Onset 1 to 4 weeks ago                      OT Treatments/Exercises (OP) - 02/24/17 0001      ADLs   ADL Education Given Yes     Manual Therapy   Manual Therapy Edema management;Compression Bandaging;Manual Lymphatic Drainage (MLD)   Manual therapy comments skin care to LLE using low pH  caastor oil during MLD   Edema Management LLE comparative limb volumetrics   Manual Lymphatic Drainage (MLD) LLE  MLD as established. NO RLE MLD today 2/2 neuropathy and excessive itching below the knee   Compression Bandaging LLE knee length compression wraps only today by Pt request.                OT Education - 02/24/17 1722    Education provided Yes   Education Details Cont Pt edu for LE self care home program and progress towards goals.   Person(s) Educated Patient   Methods Explanation;Demonstration   Comprehension Verbalized understanding;Need further instruction             OT Long Term Goals - 02/17/17 1613      OT LONG  TERM GOAL #1   Title Pt independent w/ lymphedema precautions and prevention  principals and strategies to limit LE progression and infection risk.   Baseline dependent   Time 2   Period Weeks   Status Achieved     OT LONG TERM GOAL #2   Title Lymphedema (LE) management/ self-care: Pt able to apply multi layered, gradient compression wraps w Max caregiver assistance using proper techniques within 2 weeks to achieve optimal limb volume reduction.   Baseline dependent   Time 12   Period Weeks   Status Achieved     OT LONG TERM GOAL #3   Title Lymphedema (LE) management/ self-care:  Pt to achieve at least 10% limb volume reductions bilaterally during Intensive CDT to limit LE progression, to reduce pain, and to improve safe  ambulation and functional mobility.   Baseline dependent   Time 12   Period Weeks   Status Partially Met     OT LONG TERM GOAL #4   Title Lymphedema (LE) management/ self-care:  Pt to tolerate daily compression wraps, garments and devices in keeping w/ prescribed wear regime within 1 week of issue date to progress and retain clinical and functional gains and to limit LE progression.   Baseline dependent   Time 12   Period Weeks   Status Partially Met     OT LONG TERM GOAL #5   Title Lymphedema (LE) management/ self-care:  During Management Phase CDT Pt to sustain current limb volumes within 5%, and all other clinical gains achieved during OT treatment with needed level of caregiver assistance to limit LE progression, infection risk and further functional decline.   Baseline dependent   Time 6   Period Months   Status On-going               Plan - 02/24/17 1722    Clinical Impression Statement LLE appears to have reached volumetric plateau. Tissue density is noticably decreased , but needs substantial softening to be WNL. Still, Pt approaching good juncture for compression garment for LLE to allow freer mobility and to decrease risk of banage fatigue to skin. Pt agrees with plan to complete LLE limb volumetrics next week to determine plan going forward for garment measurements. Pt continues to tolerate alll aspects of OT for CDT. She is fully engaged in sessions and  and is working on improving compliance w/ self care between sessions.   Rehab Potential Good   OT Frequency 3x / week   OT Duration 12 weeks   OT Treatment/Interventions Self-care/ADL training;Therapeutic exercise;Patient/family education;Manual Therapy;Manual lymph drainage;Other (comment);DME and/or AE instruction;Compression bandaging;Therapeutic activities  skin care w/ low pH lotion and castor oil   Consulted and Agree with Plan of Care Patient      Patient will benefit from skilled therapeutic intervention in  order to improve the following deficits and impairments:  Decreased skin integrity, Decreased knowledge of precautions, Decreased activity tolerance, Decreased knowledge of use of DME, Impaired flexibility, Decreased balance, Decreased mobility, Difficulty walking, Impaired sensation, Obesity, Decreased range of motion, Increased edema, Pain, Decreased strength, Increased fascial restricitons  Visit Diagnosis: Lymphedema, not elsewhere classified    Problem List Patient Active Problem List   Diagnosis Date Noted  . Total knee replacement status 09/21/2015    Andrey Spearman, MS, OTR/L, [facility] 02/24/17 5:27 PM  [place] MAIN [facility] SERVICES [address] [place], [place], [address] Phone: [phone]   Fax:  [phone]  Name: KEILEE DENMAN MRN: [id number]  Date of Birth: May 24, 1961

## 2017-02-27 ENCOUNTER — Ambulatory Visit: Payer: BLUE CROSS/BLUE SHIELD | Admitting: Occupational Therapy

## 2017-03-01 ENCOUNTER — Ambulatory Visit: Payer: BLUE CROSS/BLUE SHIELD | Admitting: Occupational Therapy

## 2017-03-01 DIAGNOSIS — I890 Lymphedema, not elsewhere classified: Secondary | ICD-10-CM | POA: Diagnosis not present

## 2017-03-02 NOTE — Therapy (Signed)
[place] MAIN [facility] SERVICES [address] [place], [place], [address] Phone: [phone]   Fax:  [phone]  Occupational Therapy Treatment  Patient Details  Name: Tamara Dodson MRN: [id number] Date of Birth: 1961/08/15 Referring Provider: [name], MD  Encounter Date: 03/01/2017      OT End of Session - 03/01/17 1346    Visit Number 24   Number of Visits 36   Date for OT Re-Evaluation 03/21/17   OT Start Time 0300   OT Stop Time 0400   OT Time Calculation (min) 60 min   Activity Tolerance Patient tolerated treatment well;No increased pain;Patient limited by pain   Behavior During Therapy [facility] for tasks assessed/performed      Past Medical History:  Diagnosis Date  . Anemia   . Arthritis   . Hypertension   . Lymph edema    lower extremities    Past Surgical History:  Procedure Laterality Date  . CHOLECYSTECTOMY    . JOINT REPLACEMENT Left    knee, hip  . KNEE ARTHROPLASTY Right 09/21/2015   Procedure: COMPUTER ASSISTED TOTAL KNEE ARTHROPLASTY;  Surgeon: Dereck Leep, MD;  Location: [facility] ORS;  Service: Orthopedics;  Laterality: Right;  . TONSILLECTOMY      There were no vitals filed for this visit.      Subjective Assessment - 03/01/17 1554    Subjective  (P)  Pt presents for OT visit 23 for Complete Decongestive Therapy (CDT) to address moderate, stage II, BLE lymphedema. Pt considering HOS device choices since her first   choice is not available in an off the shelf size that will fit her.    Pertinent History (P)  BLE leg swelling (A-G) and associated pain/ discomfort w, onset young adulthood without known precipitating event; HTN, OA, reported B foot and distal leg numbness; chronic back pain; chronic headaches- reported; L hip arthroplasty 2012; L knee replacement 2014; R hip arthroplasty 2016   Limitations (P)  BLE pain and swelling, L>R difficulty walking, decreased standing tolerance, difficulty fitting street  shoes and LB clothing; lack of BLE sensation contributes to falls risk; chhronic back and joint pain 2/2 OA;    Patient Stated Goals (P)  reduce swelling and pain and keep it from getting worse   Pain Onset (P)  1 to 4 weeks ago                      OT Treatments/Exercises (OP) - 03/02/17 0001      ADLs   ADL Education Given Yes     Manual Therapy   Manual Therapy Edema management;Compression Bandaging;Manual Lymphatic Drainage (MLD)   Manual therapy comments skin care to LLE using low pH  caastor oil during MLD   Edema Management LLE comparative limb volumetrics   Manual Lymphatic Drainage (MLD) LLE  MLD as established. NO RLE MLD today 2/2 neuropathy and excessive itching below the knee   Compression Bandaging LLE knee length compression wraps only today by Pt request.                OT Education - 03/01/17 1345    Education provided Yes   Education Details Continued skilled Pt/caregiver education  And LE ADL training throughout visit for lymphedema self care/ home program, including compression wrapping, compression garment and device wear/care, lymphatic pumping ther ex, simple self-MLD, and skin care. Discussed progress towards goals.   Person(s) Educated Patient   Methods Explanation   Comprehension Verbalized  understanding             OT Long Term Goals - 02/17/17 1613      OT LONG TERM GOAL #1   Title Pt independent w/ lymphedema precautions and prevention  principals and strategies to limit LE progression and infection risk.   Baseline dependent   Time 2   Period Weeks   Status Achieved     OT LONG TERM GOAL #2   Title Lymphedema (LE) management/ self-care: Pt able to apply multi layered, gradient compression wraps w Max caregiver assistance using proper techniques within 2 weeks to achieve optimal limb volume reduction.   Baseline dependent   Time 12   Period Weeks   Status Achieved     OT LONG TERM GOAL #3   Title Lymphedema (LE)  management/ self-care:  Pt to achieve at least 10% limb volume reductions bilaterally during Intensive CDT to limit LE progression, to reduce pain, and to improve safe ambulation and functional mobility.   Baseline dependent   Time 12   Period Weeks   Status Partially Met     OT LONG TERM GOAL #4   Title Lymphedema (LE) management/ self-care:  Pt to tolerate daily compression wraps, garments and devices in keeping w/ prescribed wear regime within 1 week of issue date to progress and retain clinical and functional gains and to limit LE progression.   Baseline dependent   Time 12   Period Weeks   Status Partially Met     OT LONG TERM GOAL #5   Title Lymphedema (LE) management/ self-care:  During Management Phase CDT Pt to sustain current limb volumes within 5%, and all other clinical gains achieved during OT treatment with needed level of caregiver assistance to limit LE progression, infection risk and further functional decline.   Baseline dependent   Time 6   Period Months   Status On-going             Patient will benefit from skilled therapeutic intervention in order to improve the following deficits and impairments:     Visit Diagnosis: Lymphedema, not elsewhere classified    Problem List Patient Active Problem List   Diagnosis Date Noted  . Total knee replacement status 09/21/2015    Andrey Spearman, MS, OTR/L, [facility] 03/02/17 1:47 PM  [place] MAIN [facility] SERVICES [address] [place], [place], [address] Phone: [phone]   Fax:  [phone]  Name: Tamara Dodson MRN: [id number] Date of Birth: 1961/10/15

## 2017-03-03 ENCOUNTER — Ambulatory Visit: Payer: BLUE CROSS/BLUE SHIELD | Admitting: Occupational Therapy

## 2017-03-03 DIAGNOSIS — I890 Lymphedema, not elsewhere classified: Secondary | ICD-10-CM

## 2017-03-03 NOTE — Therapy (Signed)
[place] MAIN [facility] SERVICES [address] [place], [place], [address] Phone: [phone]   Fax:  [phone]  Occupational Therapy Treatment  Patient Details  Name: Tamara Dodson MRN: [id number] Date of Birth: May 20, 1961 Referring Provider: [name], MD  Encounter Date: 03/03/2017      OT End of Session - 03/03/17 1512    Visit Number 25   Number of Visits 36   Date for OT Re-Evaluation 03/21/17   OT Start Time 0305   OT Stop Time 0410   OT Time Calculation (min) 65 min   Activity Tolerance Patient tolerated treatment well;No increased pain;Patient limited by pain   Behavior During Therapy [facility] for tasks assessed/performed      Past Medical History:  Diagnosis Date  . Anemia   . Arthritis   . Hypertension   . Lymph edema    lower extremities    Past Surgical History:  Procedure Laterality Date  . CHOLECYSTECTOMY    . JOINT REPLACEMENT Left    knee, hip  . KNEE ARTHROPLASTY Right 09/21/2015   Procedure: COMPUTER ASSISTED TOTAL KNEE ARTHROPLASTY;  Surgeon: Dereck Leep, MD;  Location: [facility] ORS;  Service: Orthopedics;  Laterality: Right;  . TONSILLECTOMY      There were no vitals filed for this visit.      Subjective Assessment - 03/03/17 1701    Subjective  Pt presents for OT visit 24 for Complete Decongestive Therapy (CDT) to address moderate, stage II, BLE lymphedema. Pt considering HOS device choices since her first   choice is not available in an off the shelf size that will fit her.    Pertinent History BLE leg swelling (A-G) and associated pain/ discomfort w, onset young adulthood without known precipitating event; HTN, OA, reported B foot and distal leg numbness; chronic back pain; chronic headaches- reported; L hip arthroplasty 2012; L knee replacement 2014; R hip arthroplasty 2016   Limitations BLE pain and swelling, L>R difficulty walking, decreased standing tolerance, difficulty fitting street shoes and LB  clothing; lack of BLE sensation contributes to falls risk; chhronic back and joint pain 2/2 OA;    Patient Stated Goals reduce swelling and pain and keep it from getting worse   Currently in Pain? No/denies   Pain Onset 1 to 4 weeks ago                      OT Treatments/Exercises (OP) - 03/03/17 0001      ADLs   ADL Education Given Yes     Manual Therapy   Edema Management Completed LLE anatomical measurements for Elvarex custom ccl 3 F knee high and HOS A-D [employer].                OT Education - 03/03/17 1702    Education provided Yes   Education Details Continued skilled Pt/caregiver education  And LE ADL training throughout visit for lymphedema self care/ home program, including compression wrapping, compression garment and device wear/care, lymphatic pumping ther ex, simple self-MLD, and skin care. Discussed progress towards goals.   Person(s) Educated Patient   Methods Explanation   Comprehension Verbalized understanding             OT Long Term Goals - 02/17/17 1613      OT LONG TERM GOAL #1   Title Pt independent w/ lymphedema precautions and prevention  principals and strategies to limit LE progression and infection risk.   Baseline dependent  Time 2   Period Weeks   Status Achieved     OT LONG TERM GOAL #2   Title Lymphedema (LE) management/ self-care: Pt able to apply multi layered, gradient compression wraps w Max caregiver assistance using proper techniques within 2 weeks to achieve optimal limb volume reduction.   Baseline dependent   Time 12   Period Weeks   Status Achieved     OT LONG TERM GOAL #3   Title Lymphedema (LE) management/ self-care:  Pt to achieve at least 10% limb volume reductions bilaterally during Intensive CDT to limit LE progression, to reduce pain, and to improve safe ambulation and functional mobility.   Baseline dependent   Time 12   Period Weeks   Status Partially Met     OT LONG TERM GOAL #4   Title  Lymphedema (LE) management/ self-care:  Pt to tolerate daily compression wraps, garments and devices in keeping w/ prescribed wear regime within 1 week of issue date to progress and retain clinical and functional gains and to limit LE progression.   Baseline dependent   Time 12   Period Weeks   Status Partially Met     OT LONG TERM GOAL #5   Title Lymphedema (LE) management/ self-care:  During Management Phase CDT Pt to sustain current limb volumes within 5%, and all other clinical gains achieved during OT treatment with needed level of caregiver assistance to limit LE progression, infection risk and further functional decline.   Baseline dependent   Time 6   Period Months   Status On-going               Plan - 03/03/17 1704    Clinical Impression Statement Completed anatomical measurements for daytime custom compression garments (Elvarex ccl3F A-D) and custom [employer] A-D for HOS. Pt tolerated all aspects of therapy. aFTER DISCUSSION THROUGHOUT SESSION, pT AGREES W/ PLAN TO DECREASE ot FREQUENCY TO 1 X WEEKLY TO CONSERVE VISITS while clinic staff requests additioal visits as we have not yet commenced consistent CDT to RLE, which is severly effected.    Rehab Potential Good   OT Frequency 3x / week   OT Duration 12 weeks   OT Treatment/Interventions Self-care/ADL training;Therapeutic exercise;Patient/family education;Manual Therapy;Manual lymph drainage;Other (comment);DME and/or AE instruction;Compression bandaging;Therapeutic activities  skin care w/ low pH lotion and castor oil   Consulted and Agree with Plan of Care Patient      Patient will benefit from skilled therapeutic intervention in order to improve the following deficits and impairments:  Decreased skin integrity, Decreased knowledge of precautions, Decreased activity tolerance, Decreased knowledge of use of DME, Impaired flexibility, Decreased balance, Decreased mobility, Difficulty walking, Impaired sensation, Obesity,  Decreased range of motion, Increased edema, Pain, Decreased strength, Increased fascial restricitons  Visit Diagnosis: Lymphedema, not elsewhere classified    Problem List Patient Active Problem List   Diagnosis Date Noted  . Total knee replacement status 09/21/2015    Andrey Spearman, MS, OTR/L, [facility] 03/03/17 5:08 PM  [place] MAIN [facility] SERVICES [address] [place], [place], [address] Phone: [phone]   Fax:  [phone]  Name: Tamara Dodson MRN: [id number] Date of Birth: [birth year]

## 2017-03-06 ENCOUNTER — Ambulatory Visit: Payer: BLUE CROSS/BLUE SHIELD | Admitting: Occupational Therapy

## 2017-03-08 ENCOUNTER — Ambulatory Visit: Payer: BLUE CROSS/BLUE SHIELD | Admitting: Occupational Therapy

## 2017-03-08 DIAGNOSIS — I890 Lymphedema, not elsewhere classified: Secondary | ICD-10-CM

## 2017-03-08 NOTE — Therapy (Signed)
[place] MAIN [facility] SERVICES [address] [place], [place], [address] Phone: [phone]   Fax:  [phone]  Occupational Therapy Treatment  Patient Details  Name: Tamara Dodson MRN: [id number] Date of Birth: 1961/01/31 Referring Provider: [name], MD  Encounter Date: 03/08/2017      OT End of Session - 03/08/17 1627    Visit Number 26   Number of Visits 36   Date for OT Re-Evaluation 03/21/17   OT Start Time 0315   OT Stop Time 0415   OT Time Calculation (min) 60 min   Activity Tolerance Patient tolerated treatment well;No increased pain;Patient limited by pain   Behavior During Therapy [facility] for tasks assessed/performed      Past Medical History:  Diagnosis Date  . Anemia   . Arthritis   . Hypertension   . Lymph edema    lower extremities    Past Surgical History:  Procedure Laterality Date  . CHOLECYSTECTOMY    . JOINT REPLACEMENT Left    knee, hip  . KNEE ARTHROPLASTY Right 09/21/2015   Procedure: COMPUTER ASSISTED TOTAL KNEE ARTHROPLASTY;  Surgeon: Dereck Leep, MD;  Location: [facility] ORS;  Service: Orthopedics;  Laterality: Right;  . TONSILLECTOMY      There were no vitals filed for this visit.      Subjective Assessment - 03/08/17 1620    Subjective  Pt presents for OT visit 25 for Complete Decongestive Therapy (CDT) to address moderate, stage II, BLE lymphedema. Pt informed that Flexitouch , sequential pneumatic compression device,  was approved by her insurance and is ready to ship.   Pertinent History BLE leg swelling (A-G) and associated pain/ discomfort w, onset young adulthood without known precipitating event; HTN, OA, reported B foot and distal leg numbness; chronic back pain; chronic headaches- reported; L hip arthroplasty 2012; L knee replacement 2014; R hip arthroplasty 2016   Limitations BLE pain and swelling, L>R difficulty walking, decreased standing tolerance, difficulty fitting street shoes and LB  clothing; lack of BLE sensation contributes to falls risk; chhronic back and joint pain 2/2 OA;    Patient Stated Goals reduce swelling and pain and keep it from getting worse   Currently in Pain? Yes  not rated numerically. LE related pain and discomfort as outlined in eval   Pain Onset 1 to 4 weeks ago                      OT Treatments/Exercises (OP) - 03/08/17 0001      ADLs   ADL Education Given Yes     Manual Therapy   Manual Therapy Edema management;Compression Bandaging;Manual Lymphatic Drainage (MLD)   Manual therapy comments skin care to RLE using low pH  caastor oil during MLD   Manual Lymphatic Drainage (MLD) RLE MLD as established. No MLD to LLE as Pt is wrapping this leg on her own in effort to expedite garment fitting   Compression Bandaging RLE compression wraps as established. Pt wrapped LLE at home.                OT Education - 03/08/17 1626    Education provided Yes   Education Details Continued skilled Pt/caregiver education And LE ADL training throughout visit for lymphedema self care/ home program, including cContinued skilled Pt/caregiver education And LE ADL training throughout visit for lymphedema self care/ home program, including compression wrapping, compression garment and device wear/care, lymphatic pumping ther ex, simple self-MLD, and skin  care. Discussed progress towards goals   Person(s) Educated Patient   Methods Explanation   Comprehension Verbalized understanding             OT Long Term Goals - 02/17/17 1613      OT LONG TERM GOAL #1   Title Pt independent w/ lymphedema precautions and prevention  principals and strategies to limit LE progression and infection risk.   Baseline dependent   Time 2   Period Weeks   Status Achieved     OT LONG TERM GOAL #2   Title Lymphedema (LE) management/ self-care: Pt able to apply multi layered, gradient compression wraps w Max caregiver assistance using proper techniques  within 2 weeks to achieve optimal limb volume reduction.   Baseline dependent   Time 12   Period Weeks   Status Achieved     OT LONG TERM GOAL #3   Title Lymphedema (LE) management/ self-care:  Pt to achieve at least 10% limb volume reductions bilaterally during Intensive CDT to limit LE progression, to reduce pain, and to improve safe ambulation and functional mobility.   Baseline dependent   Time 12   Period Weeks   Status Partially Met     OT LONG TERM GOAL #4   Title Lymphedema (LE) management/ self-care:  Pt to tolerate daily compression wraps, garments and devices in keeping w/ prescribed wear regime within 1 week of issue date to progress and retain clinical and functional gains and to limit LE progression.   Baseline dependent   Time 12   Period Weeks   Status Partially Met     OT LONG TERM GOAL #5   Title Lymphedema (LE) management/ self-care:  During Management Phase CDT Pt to sustain current limb volumes within 5%, and all other clinical gains achieved during OT treatment with needed level of caregiver assistance to limit LE progression, infection risk and further functional decline.   Baseline dependent   Time 6   Period Months   Status On-going               Plan - 03/08/17 1628    Clinical Impression Statement Emphasis of CDT to RLE today as Pt is managing LLE using self care protocols in an effort to move towards garment fitting ASAP. RLE is palpably less congested today, but it continues to be continues to be significantly swollen and dense.  Pt in agreement w/ plan to decrease OT frequency for LE care to 1 x weekly until appeal to insurance company for additional visits is completed. Pt will continue to dilligently perform all LE self-care protocols daily between visits, including compression wraps bilaterally to knees.   Rehab Potential Good   OT Frequency 3x / week   OT Duration 12 weeks   OT Treatment/Interventions Self-care/ADL training;Therapeutic  exercise;Patient/family education;Manual Therapy;Manual lymph drainage;Other (comment);DME and/or AE instruction;Compression bandaging;Therapeutic activities  skin care w/ low pH lotion and castor oil   Consulted and Agree with Plan of Care Patient      Patient will benefit from skilled therapeutic intervention in order to improve the following deficits and impairments:  Decreased skin integrity, Decreased knowledge of precautions, Decreased activity tolerance, Decreased knowledge of use of DME, Impaired flexibility, Decreased balance, Decreased mobility, Difficulty walking, Impaired sensation, Obesity, Decreased range of motion, Increased edema, Pain, Decreased strength, Increased fascial restricitons  Visit Diagnosis: Lymphedema, not elsewhere classified    Problem List Patient Active Problem List   Diagnosis Date Noted  . Total knee replacement status 09/21/2015  Andrey Spearman, MS, OTR/L, [facility] 03/08/17 4:33 PM  [place] MAIN [facility] SERVICES [address] [place], [place], [address] Phone: [phone]   Fax:  [phone]  Name: ZAYDAH NAWABI MRN: [id number] Date of Birth: August 23, 1961

## 2017-03-10 ENCOUNTER — Ambulatory Visit: Payer: BLUE CROSS/BLUE SHIELD | Admitting: Occupational Therapy

## 2017-03-13 ENCOUNTER — Ambulatory Visit: Payer: BLUE CROSS/BLUE SHIELD | Admitting: Occupational Therapy

## 2017-03-15 ENCOUNTER — Ambulatory Visit: Payer: BLUE CROSS/BLUE SHIELD | Admitting: Occupational Therapy

## 2017-03-15 DIAGNOSIS — I890 Lymphedema, not elsewhere classified: Secondary | ICD-10-CM | POA: Diagnosis not present

## 2017-03-15 NOTE — Therapy (Signed)
[place] MAIN [facility] SERVICES [address] [place], [place], [address] Phone: [phone]   Fax:  [phone]  Occupational Therapy Treatment  Patient Details  Name: Tamara Dodson MRN: [id number] Date of Birth: 1961-02-24 Referring Provider: [name], MD  Encounter Date: 03/15/2017      OT End of Session - 03/15/17 1617    Visit Number 27   Number of Visits 36   Date for OT Re-Evaluation 03/21/17   OT Start Time 0245   OT Stop Time 0400   OT Time Calculation (min) 75 min   Activity Tolerance Patient tolerated treatment well;No increased pain;Patient limited by pain   Behavior During Therapy [facility] for tasks assessed/performed      Past Medical History:  Diagnosis Date  . Anemia   . Arthritis   . Hypertension   . Lymph edema    lower extremities    Past Surgical History:  Procedure Laterality Date  . CHOLECYSTECTOMY    . JOINT REPLACEMENT Left    knee, hip  . KNEE ARTHROPLASTY Right 09/21/2015   Procedure: COMPUTER ASSISTED TOTAL KNEE ARTHROPLASTY;  Surgeon: Dereck Leep, MD;  Location: [facility] ORS;  Service: Orthopedics;  Laterality: Right;  . TONSILLECTOMY      There were no vitals filed for this visit.      Subjective Assessment - 03/15/17 1611    Subjective  Pt presents for OT visit 27 for Complete Decongestive Therapy (CDT) to address moderate, stage II, BLE lymphedema. Pt reports Flexitouch was delivered a week ago, but she has not been contacted by company re training. OT will email company on Pt's behalf.   Pertinent History BLE leg swelling (A-G) and associated pain/ discomfort w, onset young adulthood without known precipitating event; HTN, OA, reported B foot and distal leg numbness; chronic back pain; chronic headaches- reported; L hip arthroplasty 2012; L knee replacement 2014; R hip arthroplasty 2016   Limitations BLE pain and swelling, L>R difficulty walking, decreased standing tolerance, difficulty fitting  street shoes and LB clothing; lack of BLE sensation contributes to falls risk; chhronic back and joint pain 2/2 OA;    Patient Stated Goals reduce swelling and pain and keep it from getting worse   Currently in Pain? No/denies   Pain Onset 1 to 4 weeks ago                      OT Treatments/Exercises (OP) - 03/15/17 0001      ADLs   ADL Education Given Yes     Manual Therapy   Manual Therapy Edema management;Compression Bandaging;Manual Lymphatic Drainage (MLD)   Manual therapy comments skin care to RLE using low pH  caastor oil during MLD   Manual Lymphatic Drainage (MLD) RLE MLD as established. No MLD to LLE as Pt is wrapping this leg on her own in effort to expedite garment fitting   Compression Bandaging BLE compression wraps as established. Pt wrapped LLE at home.                OT Education - 03/15/17 1616    Education provided Yes   Education Details Continued skilled Pt/caregiver education  And LE ADL training throughout visit for lymphedema self care/ home program, including compression wrapping, compression garment and device wear/care, lymphatic pumping ther ex, simple self-MLD, and skin care. Discussed progress towards goals.   Person(s) Educated Patient   Methods Explanation   Comprehension Verbalized understanding;Need further instruction  OT Long Term Goals - 02/17/17 1613      OT LONG TERM GOAL #1   Title Pt independent w/ lymphedema precautions and prevention  principals and strategies to limit LE progression and infection risk.   Baseline dependent   Time 2   Period Weeks   Status Achieved     OT LONG TERM GOAL #2   Title Lymphedema (LE) management/ self-care: Pt able to apply multi layered, gradient compression wraps w Max caregiver assistance using proper techniques within 2 weeks to achieve optimal limb volume reduction.   Baseline dependent   Time 12   Period Weeks   Status Achieved     OT LONG TERM GOAL #3    Title Lymphedema (LE) management/ self-care:  Pt to achieve at least 10% limb volume reductions bilaterally during Intensive CDT to limit LE progression, to reduce pain, and to improve safe ambulation and functional mobility.   Baseline dependent   Time 12   Period Weeks   Status Partially Met     OT LONG TERM GOAL #4   Title Lymphedema (LE) management/ self-care:  Pt to tolerate daily compression wraps, garments and devices in keeping w/ prescribed wear regime within 1 week of issue date to progress and retain clinical and functional gains and to limit LE progression.   Baseline dependent   Time 12   Period Weeks   Status Partially Met     OT LONG TERM GOAL #5   Title Lymphedema (LE) management/ self-care:  During Management Phase CDT Pt to sustain current limb volumes within 5%, and all other clinical gains achieved during OT treatment with needed level of caregiver assistance to limit LE progression, infection risk and further functional decline.   Baseline dependent   Time 6   Period Months   Status On-going               Plan - 03/15/17 1618    Clinical Impression Statement Encouraged Pt to continue w/ diuretic as perscribed as this assists [place] w/ lymphedema management by limitting contribution by systemic fluid retention. RLE is both palpably  and visually decreased in both swelling and density since last visit. It is responding slowly, but steadily to CDT. Pt continues to wrap BLE between visits, now decreased to 1 x weekly due to  limited insurance reimbursement for extra visits. We are awaiting appeal for additional visits. DME vendor confirms today that garments are under preauthorization review by insurance company, and will be ordered as soon as this is complete. Cont 1 x weekly and PRN.   Rehab Potential Good   OT Frequency 3x / week   OT Duration 12 weeks   OT Treatment/Interventions Self-care/ADL training;Therapeutic exercise;Patient/family education;Manual  Therapy;Manual lymph drainage;Other (comment);DME and/or AE instruction;Compression bandaging;Therapeutic activities  skin care w/ low pH lotion and castor oil   Consulted and Agree with Plan of Care Patient      Patient will benefit from skilled therapeutic intervention in order to improve the following deficits and impairments:  Decreased skin integrity, Decreased knowledge of precautions, Decreased activity tolerance, Decreased knowledge of use of DME, Impaired flexibility, Decreased balance, Decreased mobility, Difficulty walking, Impaired sensation, Obesity, Decreased range of motion, Increased edema, Pain, Decreased strength, Increased fascial restricitons  Visit Diagnosis: Lymphedema, not elsewhere classified    Problem List Patient Active Problem List   Diagnosis Date Noted  . Total knee replacement status 09/21/2015    Andrey Spearman, MS, OTR/L, [facility] 03/15/17 4:23 PM     [facility] MAIN [facility] SERVICES [address] [place], [place], [address] Phone: [phone]   Fax:  [phone]  Name: AMARISSA KOERNER MRN: [id number] Date of Birth: 1960/12/27

## 2017-03-17 ENCOUNTER — Encounter: Payer: BLUE CROSS/BLUE SHIELD | Admitting: Occupational Therapy

## 2017-03-17 ENCOUNTER — Ambulatory Visit: Payer: BLUE CROSS/BLUE SHIELD | Admitting: Occupational Therapy

## 2017-03-20 ENCOUNTER — Ambulatory Visit (INDEPENDENT_AMBULATORY_CARE_PROVIDER_SITE_OTHER): Payer: BLUE CROSS/BLUE SHIELD | Admitting: Vascular Surgery

## 2017-03-20 ENCOUNTER — Encounter: Payer: BLUE CROSS/BLUE SHIELD | Admitting: Occupational Therapy

## 2017-03-22 ENCOUNTER — Ambulatory Visit: Payer: BLUE CROSS/BLUE SHIELD | Attending: Family Medicine | Admitting: Occupational Therapy

## 2017-03-22 DIAGNOSIS — I890 Lymphedema, not elsewhere classified: Secondary | ICD-10-CM | POA: Diagnosis not present

## 2017-03-22 NOTE — Therapy (Signed)
[place] MAIN [facility] SERVICES [address] [place], [place], [address] Phone: [phone]   Fax:  [phone]  Occupational Therapy Treatment  Patient Details  Name: Tamara Dodson MRN: [id number] Date of Birth: 1961/11/07 Referring Provider: [name], MD  Encounter Date: 03/22/2017      OT End of Session - 03/22/17 1734    Visit Number 28   Number of Visits 36   Date for OT Re-Evaluation 03/21/17   OT Start Time 0300   OT Stop Time 0400   OT Time Calculation (min) 60 min   Activity Tolerance Patient tolerated treatment well;No increased pain;Patient limited by pain   Behavior During Therapy [facility] for tasks assessed/performed      Past Medical History:  Diagnosis Date  . Anemia   . Arthritis   . Hypertension   . Lymph edema    lower extremities    Past Surgical History:  Procedure Laterality Date  . CHOLECYSTECTOMY    . JOINT REPLACEMENT Left    knee, hip  . KNEE ARTHROPLASTY Right 09/21/2015   Procedure: COMPUTER ASSISTED TOTAL KNEE ARTHROPLASTY;  Surgeon: Dereck Leep, MD;  Location: [facility] ORS;  Service: Orthopedics;  Laterality: Right;  . TONSILLECTOMY      There were no vitals filed for this visit.      Subjective Assessment - 03/22/17 1732    Subjective  Pt presents for OT visit 28 for Complete Decongestive Therapy (CDT) to address moderate, stage II, BLE lymphedema. Pt has no new complaints.   Pertinent History BLE leg swelling (A-G) and associated pain/ discomfort w, onset young adulthood without known precipitating event; HTN, OA, reported B foot and distal leg numbness; chronic back pain; chronic headaches- reported; L hip arthroplasty 2012; L knee replacement 2014; R hip arthroplasty 2016   Limitations BLE pain and swelling, L>R difficulty walking, decreased standing tolerance, difficulty fitting street shoes and LB clothing; lack of BLE sensation contributes to falls risk; chhronic back and joint pain 2/2 OA;    Patient Stated Goals reduce swelling and pain and keep it from getting worse   Currently in Pain? No/denies   Pain Onset 1 to 4 weeks ago                      OT Treatments/Exercises (OP) - 03/22/17 0001      ADLs   ADL Education Given Yes     Manual Therapy   Manual Therapy Edema management;Compression Bandaging;Manual Lymphatic Drainage (MLD)   Manual therapy comments skin care to RLE using low pH  caastor oil during MLD   Manual Lymphatic Drainage (MLD) RLE MLD as established. No MLD to LLE as Pt is wrapping this leg on her own in effort to expedite garment fitting   Compression Bandaging BLE compression wraps as established. Pt wrapped LLE at home.                OT Education - 03/22/17 1733    Education provided Yes   Education Details Continued skilled Pt/caregiver education  And LE ADL training throughout visit for lymphedema self care/ home program, including compression wrapping, compression garment and device wear/care, lymphatic pumping ther ex, simple self-MLD, and skin care. Discussed progress towards goals.   Person(s) Educated Patient   Methods Explanation   Comprehension Verbalized understanding             OT Long Term Goals - 02/17/17 1613      OT LONG  TERM GOAL #1   Title Pt independent w/ lymphedema precautions and prevention  principals and strategies to limit LE progression and infection risk.   Baseline dependent   Time 2   Period Weeks   Status Achieved     OT LONG TERM GOAL #2   Title Lymphedema (LE) management/ self-care: Pt able to apply multi layered, gradient compression wraps w Max caregiver assistance using proper techniques within 2 weeks to achieve optimal limb volume reduction.   Baseline dependent   Time 12   Period Weeks   Status Achieved     OT LONG TERM GOAL #3   Title Lymphedema (LE) management/ self-care:  Pt to achieve at least 10% limb volume reductions bilaterally during Intensive CDT to limit LE  progression, to reduce pain, and to improve safe ambulation and functional mobility.   Baseline dependent   Time 12   Period Weeks   Status Partially Met     OT LONG TERM GOAL #4   Title Lymphedema (LE) management/ self-care:  Pt to tolerate daily compression wraps, garments and devices in keeping w/ prescribed wear regime within 1 week of issue date to progress and retain clinical and functional gains and to limit LE progression.   Baseline dependent   Time 12   Period Weeks   Status Partially Met     OT LONG TERM GOAL #5   Title Lymphedema (LE) management/ self-care:  During Management Phase CDT Pt to sustain current limb volumes within 5%, and all other clinical gains achieved during OT treatment with needed level of caregiver assistance to limit LE progression, infection risk and further functional decline.   Baseline dependent   Time 6   Period Months   Status On-going               Plan - 03/22/17 1734    Clinical Impression Statement Pt tolerated all aspects of LE therapy today. Both legs   decongested and density continues to decrease. Pt discouraged that her garments are not yet available for fitting. Cont as per POC.   Rehab Potential Good   OT Frequency 2x / week   OT Duration 12 weeks   OT Treatment/Interventions Self-care/ADL training;Therapeutic exercise;Patient/family education;Manual Therapy;Manual lymph drainage;Other (comment);DME and/or AE instruction;Compression bandaging;Therapeutic activities  skin care w/ low pH lotion and castor oil   Consulted and Agree with Plan of Care Patient      Patient will benefit from skilled therapeutic intervention in order to improve the following deficits and impairments:  Decreased skin integrity, Decreased knowledge of precautions, Decreased activity tolerance, Decreased knowledge of use of DME, Impaired flexibility, Decreased balance, Decreased mobility, Difficulty walking, Impaired sensation, Obesity, Decreased range of  motion, Increased edema, Pain, Decreased strength, Increased fascial restricitons  Visit Diagnosis: Lymphedema, not elsewhere classified    Problem List Patient Active Problem List   Diagnosis Date Noted  . Total knee replacement status 09/21/2015   Andrey Spearman, MS, OTR/L, [facility] 03/22/17 5:37 PM   [place] MAIN [facility] SERVICES [address] [place], [place], [address] Phone: [phone]   Fax:  [phone]  Name: Tamara Dodson MRN: [id number] Date of Birth: 1961/10/24

## 2017-03-24 ENCOUNTER — Encounter: Payer: BLUE CROSS/BLUE SHIELD | Admitting: Occupational Therapy

## 2017-03-27 ENCOUNTER — Encounter: Payer: BLUE CROSS/BLUE SHIELD | Admitting: Occupational Therapy

## 2017-03-29 ENCOUNTER — Ambulatory Visit: Payer: BLUE CROSS/BLUE SHIELD | Admitting: Occupational Therapy

## 2017-03-29 DIAGNOSIS — I890 Lymphedema, not elsewhere classified: Secondary | ICD-10-CM

## 2017-03-29 NOTE — Therapy (Signed)
[place] MAIN [facility] SERVICES [address] [place], [place], [address] Phone: [phone]   Fax:  [phone]  Occupational Therapy Treatment  Patient Details  Name: Tamara Dodson MRN: [id number] Date of Birth: 1961/12/16 Referring Provider: [name], MD  Encounter Date: 03/29/2017      OT End of Session - 03/29/17 1611    Visit Number 29   Number of Visits 36   Date for OT Re-Evaluation 03/21/17   OT Start Time 0300   OT Stop Time 0400   OT Time Calculation (min) 60 min   Activity Tolerance Patient tolerated treatment well;No increased pain;Patient limited by pain   Behavior During Therapy [facility] for tasks assessed/performed      Past Medical History:  Diagnosis Date  . Anemia   . Arthritis   . Hypertension   . Lymph edema    lower extremities    Past Surgical History:  Procedure Laterality Date  . CHOLECYSTECTOMY    . JOINT REPLACEMENT Left    knee, hip  . KNEE ARTHROPLASTY Right 09/21/2015   Procedure: COMPUTER ASSISTED TOTAL KNEE ARTHROPLASTY;  Surgeon: Dereck Leep, MD;  Location: [facility] ORS;  Service: Orthopedics;  Laterality: Right;  . TONSILLECTOMY      There were no vitals filed for this visit.      Subjective Assessment - 03/29/17 1609    Subjective  Pt presents for OT visit 29 for Complete Decongestive Therapy (CDT) to address moderate, stage II, BLE lymphedema. Pt has no new complaints. By request OT emailed DME consent form on Pt's behalf .   Pertinent History BLE leg swelling (A-G) and associated pain/ discomfort w, onset young adulthood without known precipitating event; HTN, OA, reported B foot and distal leg numbness; chronic back pain; chronic headaches- reported; L hip arthroplasty 2012; L knee replacement 2014; R hip arthroplasty 2016   Limitations BLE pain and swelling, L>R difficulty walking, decreased standing tolerance, difficulty fitting street shoes and LB clothing; lack of BLE sensation contributes  to falls risk; chhronic back and joint pain 2/2 OA;    Patient Stated Goals reduce swelling and pain and keep it from getting worse   Currently in Pain? No/denies   Pain Onset 1 to 4 weeks ago                      OT Treatments/Exercises (OP) - 03/29/17 0001      ADLs   ADL Education Given Yes     Manual Therapy   Manual Therapy Edema management;Compression Bandaging;Manual Lymphatic Drainage (MLD)   Manual therapy comments skin care to RLE using low pH  caastor oil during MLD   Manual Lymphatic Drainage (MLD) RLE MLD as established. No MLD to LLE as Pt is wrapping this leg on her own in effort to expedite garment fitting   Compression Bandaging BLE compression wraps as established. Pt wrapped LLE at home.                OT Education - 03/29/17 1611    Education provided Yes   Education Details Continued skilled Pt/caregiver education  And LE ADL training throughout visit for lymphedema self care/ home program, including compression wrapping, compression garment and device wear/care, lymphatic pumping ther ex, simple self-MLD, and skin care. Discussed progress towards goals.   Person(s) Educated Patient   Methods Explanation   Comprehension Verbalized understanding             OT Long  Term Goals - 02/17/17 1613      OT LONG TERM GOAL #1   Title Pt independent w/ lymphedema precautions and prevention  principals and strategies to limit LE progression and infection risk.   Baseline dependent   Time 2   Period Weeks   Status Achieved     OT LONG TERM GOAL #2   Title Lymphedema (LE) management/ self-care: Pt able to apply multi layered, gradient compression wraps w Max caregiver assistance using proper techniques within 2 weeks to achieve optimal limb volume reduction.   Baseline dependent   Time 12   Period Weeks   Status Achieved     OT LONG TERM GOAL #3   Title Lymphedema (LE) management/ self-care:  Pt to achieve at least 10% limb volume  reductions bilaterally during Intensive CDT to limit LE progression, to reduce pain, and to improve safe ambulation and functional mobility.   Baseline dependent   Time 12   Period Weeks   Status Partially Met     OT LONG TERM GOAL #4   Title Lymphedema (LE) management/ self-care:  Pt to tolerate daily compression wraps, garments and devices in keeping w/ prescribed wear regime within 1 week of issue date to progress and retain clinical and functional gains and to limit LE progression.   Baseline dependent   Time 12   Period Weeks   Status Partially Met     OT LONG TERM GOAL #5   Title Lymphedema (LE) management/ self-care:  During Management Phase CDT Pt to sustain current limb volumes within 5%, and all other clinical gains achieved during OT treatment with needed level of caregiver assistance to limit LE progression, infection risk and further functional decline.   Baseline dependent   Time 6   Period Months   Status On-going               Plan - 03/29/17 1612    Clinical Impression Statement Pt presenting with increasaed RLE swelling and tissue density today. Pt encouraged to discuss increased fluctuating systemic fluid retenion with her PCP. Also suggested Pt weigh herself daily at same time each morning and record data for MD discussion. Pt tolerated MLD and compression wrap during Rx today without difficulty. No tissue softening noted at end of session. In tis case, lymphedea is very stubborn and slow to resond to manual therpy. Cont as per POC.   Rehab Potential Good   OT Frequency 2x / week   OT Duration 12 weeks   OT Treatment/Interventions Self-care/ADL training;Therapeutic exercise;Patient/family education;Manual Therapy;Manual lymph drainage;Other (comment);DME and/or AE instruction;Compression bandaging;Therapeutic activities  skin care w/ low pH lotion and castor oil   Consulted and Agree with Plan of Care Patient      Patient will benefit from skilled  therapeutic intervention in order to improve the following deficits and impairments:  Decreased skin integrity, Decreased knowledge of precautions, Decreased activity tolerance, Decreased knowledge of use of DME, Impaired flexibility, Decreased balance, Decreased mobility, Difficulty walking, Impaired sensation, Obesity, Decreased range of motion, Increased edema, Pain, Decreased strength, Increased fascial restricitons  Visit Diagnosis: Lymphedema, not elsewhere classified    Problem List Patient Active Problem List   Diagnosis Date Noted  . Total knee replacement status 09/21/2015   Andrey Spearman, MS, OTR/L, [facility] 03/29/17 4:16 PM   [place] MAIN [facility] SERVICES [address] [place], [place], [address] Phone: [phone]   Fax:  [phone]  Name: Tamara Dodson MRN: [id number] Date of Birth:  11/13/1961

## 2017-03-31 ENCOUNTER — Ambulatory Visit: Payer: BLUE CROSS/BLUE SHIELD | Admitting: Occupational Therapy

## 2017-03-31 ENCOUNTER — Encounter: Payer: BLUE CROSS/BLUE SHIELD | Admitting: Occupational Therapy

## 2017-04-02 DIAGNOSIS — Z96642 Presence of left artificial hip joint: Secondary | ICD-10-CM | POA: Insufficient documentation

## 2017-04-03 ENCOUNTER — Encounter: Payer: BLUE CROSS/BLUE SHIELD | Admitting: Occupational Therapy

## 2017-04-05 ENCOUNTER — Ambulatory Visit: Payer: BLUE CROSS/BLUE SHIELD | Admitting: Occupational Therapy

## 2017-04-05 DIAGNOSIS — I890 Lymphedema, not elsewhere classified: Secondary | ICD-10-CM | POA: Diagnosis not present

## 2017-04-05 NOTE — Therapy (Signed)
[place] [facility] MAIN [facility] SERVICES [address] [place], [place], [address] Phone: [phone]   Fax:  [phone]  Occupational Therapy Treatment  Patient Details  Name: Tamara Dodson MRN: [id number] Date of Birth: 11/23/1961 Referring Provider: [name], MD  Encounter Date: 04/05/2017      OT End of Session - 04/05/17 1706    Visit Number 30   Number of Visits 36   Date for OT Re-Evaluation 03/21/17   OT Start Time 0310   OT Stop Time 0410   OT Time Calculation (min) 60 min   Activity Tolerance Patient tolerated treatment well;No increased pain;Patient limited by pain   Behavior During Therapy [facility] for tasks assessed/performed      Past Medical History:  Diagnosis Date  . Anemia   . Arthritis   . Hypertension   . Lymph edema    lower extremities    Past Surgical History:  Procedure Laterality Date  . CHOLECYSTECTOMY    . JOINT REPLACEMENT Left    knee, hip  . KNEE ARTHROPLASTY Right 09/21/2015   Procedure: COMPUTER ASSISTED TOTAL KNEE ARTHROPLASTY;  Surgeon: Donato Heinz, MD;  Location: [facility] ORS;  Service: Orthopedics;  Laterality: Right;  . TONSILLECTOMY      There were no vitals filed for this visit.      Subjective Assessment - 04/05/17 1657    Subjective  Pt presents for OT visit 30 for Complete Decongestive Therapy (CDT) to address moderate, stage II, BLE lymphedema. Today is Pt's last reimbursed visit. Pt agreees to observe LE precautions and perform daily LE self care while awaiting garment delivery.   Pertinent History rei   Limitations BLE pain and swelling, L>R difficulty walking, decreased standing tolerance, difficulty fitting street shoes and LB clothing; lack of BLE sensation contributes to falls risk; chhronic back and joint pain 2/2 OA;    Patient Stated Goals reduce swelling and pain and keep it from getting worse   Currently in Pain? No/denies   Pain Onset 1 to 4 weeks ago                       OT Treatments/Exercises (OP) - 04/05/17 0001      ADLs   ADL Education Given Yes     Manual Therapy   Manual Therapy Edema management;Compression Bandaging   Manual therapy comments Completed anatomical measurements for RLE compression garment.   Compression Bandaging BLE compression wraps as established. Pt wrapped LLE at home.                OT Education - 04/05/17 1700    Education provided Yes   Education Details Continued skilled Pt/caregiver education  And LE ADL training throughout visit for lymphedema self care/ home program, including compression wrapping, compression garment and device wear/care, lymphatic pumping ther ex, simple self-MLD, and skin care. Discussed progress towards goals.   Person(s) Educated Patient   Methods Explanation   Comprehension Verbalized understanding             OT Long Term Goals - 02/17/17 1613      OT LONG TERM GOAL #1   Title Pt independent w/ lymphedema precautions and prevention  principals and strategies to limit LE progression and infection risk.   Baseline dependent   Time 2   Period Weeks   Status Achieved     OT LONG TERM GOAL #2   Title Lymphedema (LE) management/ self-care: Pt able to apply multi  layered, gradient compression wraps w Max caregiver assistance using proper techniques within 2 weeks to achieve optimal limb volume reduction.   Baseline dependent   Time 12   Period Weeks   Status Achieved     OT LONG TERM GOAL #3   Title Lymphedema (LE) management/ self-care:  Pt to achieve at least 10% limb volume reductions bilaterally during Intensive CDT to limit LE progression, to reduce pain, and to improve safe ambulation and functional mobility.   Baseline dependent   Time 12   Period Weeks   Status Partially Met     OT LONG TERM GOAL #4   Title Lymphedema (LE) management/ self-care:  Pt to tolerate daily compression wraps, garments and devices in keeping w/ prescribed  wear regime within 1 week of issue date to progress and retain clinical and functional gains and to limit LE progression.   Baseline dependent   Time 12   Period Weeks   Status Partially Met     OT LONG TERM GOAL #5   Title Lymphedema (LE) management/ self-care:  During Management Phase CDT Pt to sustain current limb volumes within 5%, and all other clinical gains achieved during OT treatment with needed level of caregiver assistance to limit LE progression, infection risk and further functional decline.   Baseline dependent   Time 6   Period Months   Status On-going               Plan - 04/05/17 1707    Clinical Impression Statement Pt learned today that reimbursement for OT from secondary insurance will not cover visits going forward for 2018. Today is Pt's last scheduled visit for OT for LE care. Completed measurements for RLE custom knee length compression garment and faxed to DME vendor. Pt agrees to wash and wear garments and stop over at lunch , or after work for OT to confirm proper fit before discharge from therapy.    Rehab Potential Good   OT Frequency 2x / week   OT Duration 12 weeks   OT Treatment/Interventions Self-care/ADL training;Therapeutic exercise;Patient/family education;Manual Therapy;Manual lymph drainage;Other (comment);DME and/or AE instruction;Compression bandaging;Therapeutic activities  skin care w/ low pH lotion and castor oil   Consulted and Agree with Plan of Care Patient      Patient will benefit from skilled therapeutic intervention in order to improve the following deficits and impairments:  Decreased skin integrity, Decreased knowledge of precautions, Decreased activity tolerance, Decreased knowledge of use of DME, Impaired flexibility, Decreased balance, Decreased mobility, Difficulty walking, Impaired sensation, Obesity, Decreased range of motion, Increased edema, Pain, Decreased strength, Increased fascial restricitons  Visit  Diagnosis: Lymphedema, not elsewhere classified    Problem List Patient Active Problem List   Diagnosis Date Noted  . Total knee replacement status 09/21/2015    Andrey Spearman, MS, OTR/L, [facility] 04/05/17 5:10 PM  [place] MAIN [facility] SERVICES [address] [place], [place], [address] Phone: [phone]   Fax:  [phone]  Name: BRYCELYN GAMBINO MRN: [id number] Date of Birth: [birth year]

## 2017-04-05 NOTE — Patient Instructions (Signed)
Cont to follow lymphedema precautions.  Cont to perform all LE self care daily, including simple self MLD, knee length compression wrapping, skin care and therex.   Lymphedema Precautions  If you experience atypical shortness of breath, or notice any signs /symptoms of skin infection (aka cellulitis) remove all compression wraps/ garments, discontinue manual lymphatic drainage (MLD), and report symptoms to your physician immediately. Discontinue MLD and compression for 72 hours after you take your first oral antibiotic so not to spread the infection.   Lymphedema Self- Care Instructions  1. EXERCISE: Perform lymphatic pumping there ex 2 x a day. While wearing your compression wraps or garments. Perform 10 reps of each exercise bilaterally and be sure to perform them in order. Don;t skip around!  OMIT PARTIAL SIT UP  2. MLD: Perform simple self-Manual Lymphatic Drainage (MLD) at least once a day as directed.  3. WRAPS: Compression wraps are to be worn 23 hrs/ 7 days/wk during Intensive Phase of Complete Decongestive Therapy (CDT).Building tolerance may take time and practice, so don't get discouraged. If bandages begin to feel tight during periods of inactivity and/or during the night, try performing your exercises to loosen them.   4. GARMENTS: During Management Phase CDT your compression garments are to be worn during waking hours when active. Do NOT sleep in your garments!!   5. PUT YOUR FEET UP! Elevate your feet and legs and feet to the level of your heart whenever you are sitting down.   6. SKIN: Carefully monitor skin condition and perform impeccable hygiene daily. Bathe skin with mild soap and water and apply low pH lotion (aka Eucerin ) to improve hydration and limit infection risk.    Lymphatic Pumping Exercises:

## 2017-04-07 ENCOUNTER — Encounter: Payer: BLUE CROSS/BLUE SHIELD | Admitting: Occupational Therapy

## 2017-04-10 ENCOUNTER — Encounter: Payer: BLUE CROSS/BLUE SHIELD | Admitting: Occupational Therapy

## 2017-04-12 ENCOUNTER — Encounter: Payer: BLUE CROSS/BLUE SHIELD | Admitting: Occupational Therapy

## 2017-04-14 ENCOUNTER — Encounter: Payer: BLUE CROSS/BLUE SHIELD | Admitting: Occupational Therapy

## 2017-04-17 ENCOUNTER — Encounter: Payer: BLUE CROSS/BLUE SHIELD | Admitting: Occupational Therapy

## 2017-04-19 ENCOUNTER — Encounter: Payer: BLUE CROSS/BLUE SHIELD | Admitting: Occupational Therapy

## 2017-04-21 ENCOUNTER — Encounter: Payer: BLUE CROSS/BLUE SHIELD | Admitting: Occupational Therapy

## 2017-08-14 ENCOUNTER — Other Ambulatory Visit: Payer: Self-pay | Admitting: Student

## 2017-08-14 DIAGNOSIS — R945 Abnormal results of liver function studies: Principal | ICD-10-CM

## 2017-08-14 DIAGNOSIS — R7989 Other specified abnormal findings of blood chemistry: Secondary | ICD-10-CM

## 2017-08-14 DIAGNOSIS — K760 Fatty (change of) liver, not elsewhere classified: Secondary | ICD-10-CM

## 2017-08-15 ENCOUNTER — Other Ambulatory Visit: Payer: Self-pay | Admitting: Family Medicine

## 2017-08-15 DIAGNOSIS — Z1231 Encounter for screening mammogram for malignant neoplasm of breast: Secondary | ICD-10-CM

## 2017-08-24 ENCOUNTER — Ambulatory Visit
Admission: RE | Admit: 2017-08-24 | Discharge: 2017-08-24 | Disposition: A | Discharge status: Home / Self Care | Payer: BLUE CROSS/BLUE SHIELD | Source: Ambulatory Visit | Attending: Student | Admitting: Student

## 2017-08-24 DIAGNOSIS — K760 Fatty (change of) liver, not elsewhere classified: Secondary | ICD-10-CM

## 2017-08-24 DIAGNOSIS — R945 Abnormal results of liver function studies: Secondary | ICD-10-CM

## 2017-08-24 DIAGNOSIS — R161 Splenomegaly, not elsewhere classified: Secondary | ICD-10-CM | POA: Diagnosis not present

## 2017-08-24 DIAGNOSIS — R760 Raised antibody titer: Secondary | ICD-10-CM | POA: Diagnosis not present

## 2017-08-24 DIAGNOSIS — R7989 Other specified abnormal findings of blood chemistry: Secondary | ICD-10-CM

## 2017-08-25 ENCOUNTER — Ambulatory Visit
Admission: RE | Admit: 2017-08-25 | Discharge: 2017-08-25 | Disposition: A | Discharge status: Home / Self Care | Payer: BLUE CROSS/BLUE SHIELD | Source: Ambulatory Visit | Attending: Family Medicine | Admitting: Family Medicine

## 2017-08-25 DIAGNOSIS — Z1231 Encounter for screening mammogram for malignant neoplasm of breast: Secondary | ICD-10-CM | POA: Diagnosis not present

## 2017-11-02 ENCOUNTER — Other Ambulatory Visit: Payer: Self-pay

## 2017-11-02 ENCOUNTER — Encounter
Admission: RE | Admit: 2017-11-02 | Discharge: 2017-11-02 | Disposition: A | Discharge status: Home / Self Care | Payer: BLUE CROSS/BLUE SHIELD | Source: Ambulatory Visit | Attending: Orthopedic Surgery | Admitting: Orthopedic Surgery

## 2017-11-02 DIAGNOSIS — I10 Essential (primary) hypertension: Secondary | ICD-10-CM | POA: Diagnosis not present

## 2017-11-02 DIAGNOSIS — Z01810 Encounter for preprocedural cardiovascular examination: Secondary | ICD-10-CM | POA: Diagnosis not present

## 2017-11-02 DIAGNOSIS — Z01812 Encounter for preprocedural laboratory examination: Secondary | ICD-10-CM | POA: Diagnosis present

## 2017-11-02 HISTORY — DX: Fatty (change of) liver, not elsewhere classified: K76.0

## 2017-11-02 LAB — TYPE AND SCREEN
ABO/RH(D): O NEG
Antibody Screen: NEGATIVE

## 2017-11-02 LAB — COMPREHENSIVE METABOLIC PANEL
ALT: 22 U/L (ref 14–54)
ANION GAP: 11 (ref 5–15)
AST: 27 U/L (ref 15–41)
Albumin: 4.5 g/dL (ref 3.5–5.0)
Alkaline Phosphatase: 68 U/L (ref 38–126)
BUN: 18 mg/dL (ref 6–20)
CHLORIDE: 102 mmol/L (ref 101–111)
CO2: 25 mmol/L (ref 22–32)
Calcium: 9.3 mg/dL (ref 8.9–10.3)
Creatinine, Ser: 0.66 mg/dL (ref 0.44–1.00)
GFR calc non Af Amer: >60 mL/min (ref >60)
Glucose, Bld: 88 mg/dL (ref 65–99)
Potassium: 3.8 mmol/L (ref 3.5–5.1)
SODIUM: 138 mmol/L (ref 135–145)
Total Bilirubin: 0.8 mg/dL (ref 0.3–1.2)
Total Protein: 7.6 g/dL (ref 6.5–8.1)

## 2017-11-02 LAB — URINALYSIS, ROUTINE W REFLEX MICROSCOPIC
Bilirubin Urine: NEGATIVE
GLUCOSE, UA: NEGATIVE mg/dL
HGB URINE DIPSTICK: NEGATIVE
KETONES UR: NEGATIVE mg/dL
Nitrite: NEGATIVE
PH: 5 (ref 5.0–8.0)
PROTEIN: NEGATIVE mg/dL
Specific Gravity, Urine: 1.027 (ref 1.005–1.030)

## 2017-11-02 LAB — CBC
HCT: 47.9 % — ABNORMAL HIGH (ref 35.0–47.0)
Hemoglobin: 16.3 g/dL — ABNORMAL HIGH (ref 12.0–16.0)
MCH: 31.5 pg (ref 26.0–34.0)
MCHC: 34 g/dL (ref 32.0–36.0)
MCV: 92.6 fL (ref 80.0–100.0)
PLATELETS: 221 10*3/uL (ref 150–440)
RBC: 5.17 MIL/uL (ref 3.80–5.20)
RDW: 13 % (ref 11.5–14.5)
WBC: 7.6 10*3/uL (ref 3.6–11.0)

## 2017-11-02 LAB — SURGICAL PCR SCREEN
MRSA, PCR: NEGATIVE
STAPHYLOCOCCUS AUREUS: NEGATIVE

## 2017-11-02 LAB — PROTIME-INR
INR: 0.92
Prothrombin Time: 12.3 seconds (ref 11.4–15.2)

## 2017-11-02 LAB — APTT: aPTT: 27 seconds (ref 24–36)

## 2017-11-02 LAB — SEDIMENTATION RATE: Sed Rate: 2 mm/hr (ref 0–30)

## 2017-11-02 LAB — C-REACTIVE PROTEIN

## 2017-11-02 NOTE — Patient Instructions (Signed)
Your procedure is scheduled on: Friday 11/17/17 Report to DAY SURGERY. 2ND FLOOR MEDICAL MALL ENTRANCE. To find out your arrival time please call [phone] between 1PM - 3PM on Thursday 11/16/17.  Remember: Instructions that are not followed completely may result in serious medical risk, up to and including death, or upon the discretion of your surgeon and anesthesiologist your surgery may need to be rescheduled.    __X__ 1. Do not eat anything after midnight the night before your    procedure.  No gum chewing or hard candies.  You may drink clear   liquids up to 2 hours before you are scheduled to arrive at the   hospital for your procedure. Do not drink clear liquids within 2   hours of scheduled arrival to the hospital as this may lead to your   procedure being delayed or rescheduled.       Clear liquids include:   Water or Apple juice without pulp   Clear carbohydrate beverage such as Clearfast or Gatorade   Black coffee or Clear Tea (no milk, no creamer, do not add anything   to the coffee or tea)    Diabetics should only drink water   __X__ 2. No Alcohol for 24 hours before or after surgery.   ____ 3. Bring all medications with you on the day of surgery if instructed.    __X__ 4. Notify your doctor if there is any change in your medical condition     (cold, fever, infections).             __X___5. No smoking within 24 hours of your surgery.     Do not wear jewelry, make-up, hairpins, clips or nail polish.  Do not wear lotions, powders, or perfumes.   Do not shave 48 hours prior to surgery. Men may shave face and neck.  Do not bring valuables to the hospital.    [facility] belongings or [facility]uables.               Contacts, dentures or bridgework may not be worn into surgery.  Leave your suitcase in the car. After surgery it may be brought to your room.  For patients admitted to the hospital, discharge time is determined by your                 treatment team.   Patients discharged the day of surgery will not be allowed to drive home.   Please read over the following fact sheets that you were given:   MRSA Information   __X__ Take these medicines the morning of surgery with A SIP OF WATER:    1. AMLODIPINE  2.   3.   4.  5.  6.  ____ Fleet Enema (as directed)   __X__ Use CHG Soap/SAGE wipes as directed  ____ Use inhalers on the day of surgery  ____ Stop metformin 2 days prior to surgery    ____ Take 1/2 of usual insulin dose the night before surgery and none on the morning of surgery.   __X__ Stop Coumadin/Plavix/aspirin on 11/08/17  __X__ Stop Anti-inflammatories such as Advil, Aleve, Ibuprofen, Motrin, Naproxen, Naprosyn, Goodies,powder, or aspirin products.  OK to take Tylenol.   __X__ Stop supplements, Vitamin E, Fish Oil until after surgery.    ____ Bring C-Pap to the hospital.

## 2017-11-03 LAB — URINE CULTURE
Culture: NO GROWTH
Special Requests: NORMAL

## 2017-11-03 NOTE — Pre-Procedure Instructions (Signed)
UA sent to Dr. Ernest PineHooten for review.  Urine culture is still pending.

## 2017-11-16 MED ORDER — CEFAZOLIN SODIUM-DEXTROSE 2-4 GM/100ML-% IV SOLN
2.0000 g | INTRAVENOUS | Status: AC
  Administered 2017-11-17: 2 g via INTRAVENOUS

## 2017-11-16 MED ORDER — TRANEXAMIC ACID 1000 MG/10ML IV SOLN
1000.0000 mg | INTRAVENOUS | Status: AC
  Administered 2017-11-17: 1000 mg via INTRAVENOUS
  Filled 2017-11-16: qty 10

## 2017-11-17 ENCOUNTER — Encounter
Admission: RE | Disposition: A | Discharge status: Home Health | Payer: Self-pay | Source: Ambulatory Visit | Attending: Orthopedic Surgery

## 2017-11-17 ENCOUNTER — Inpatient Hospital Stay
Admission: RE | Admit: 2017-11-17 | Discharge: 2017-11-19 | DRG: 470 | Disposition: A | Discharge status: Home Health | Payer: BLUE CROSS/BLUE SHIELD | Source: Ambulatory Visit | Attending: Orthopedic Surgery | Admitting: Orthopedic Surgery

## 2017-11-17 ENCOUNTER — Inpatient Hospital Stay: Payer: BLUE CROSS/BLUE SHIELD

## 2017-11-17 ENCOUNTER — Other Ambulatory Visit: Payer: Self-pay

## 2017-11-17 ENCOUNTER — Encounter: Payer: Self-pay | Admitting: Orthopedic Surgery

## 2017-11-17 ENCOUNTER — Inpatient Hospital Stay: Payer: BLUE CROSS/BLUE SHIELD | Admitting: Anesthesiology

## 2017-11-17 DIAGNOSIS — Z841 Family history of disorders of kidney and ureter: Secondary | ICD-10-CM

## 2017-11-17 DIAGNOSIS — M545 Low back pain, unspecified: Secondary | ICD-10-CM | POA: Insufficient documentation

## 2017-11-17 DIAGNOSIS — Z8261 Family history of arthritis: Secondary | ICD-10-CM

## 2017-11-17 DIAGNOSIS — Z6841 Body Mass Index (BMI) 40.0 and over, adult: Secondary | ICD-10-CM | POA: Diagnosis not present

## 2017-11-17 DIAGNOSIS — Z7982 Long term (current) use of aspirin: Secondary | ICD-10-CM | POA: Diagnosis not present

## 2017-11-17 DIAGNOSIS — I10 Essential (primary) hypertension: Secondary | ICD-10-CM | POA: Diagnosis present

## 2017-11-17 DIAGNOSIS — Z803 Family history of malignant neoplasm of breast: Secondary | ICD-10-CM

## 2017-11-17 DIAGNOSIS — Z833 Family history of diabetes mellitus: Secondary | ICD-10-CM | POA: Diagnosis not present

## 2017-11-17 DIAGNOSIS — M1611 Unilateral primary osteoarthritis, right hip: Secondary | ICD-10-CM | POA: Diagnosis present

## 2017-11-17 DIAGNOSIS — K760 Fatty (change of) liver, not elsewhere classified: Secondary | ICD-10-CM | POA: Diagnosis present

## 2017-11-17 DIAGNOSIS — Z96649 Presence of unspecified artificial hip joint: Secondary | ICD-10-CM

## 2017-11-17 DIAGNOSIS — M25551 Pain in right hip: Secondary | ICD-10-CM | POA: Diagnosis present

## 2017-11-17 DIAGNOSIS — Z8249 Family history of ischemic heart disease and other diseases of the circulatory system: Secondary | ICD-10-CM

## 2017-11-17 DIAGNOSIS — Z9049 Acquired absence of other specified parts of digestive tract: Secondary | ICD-10-CM

## 2017-11-17 HISTORY — PX: TOTAL HIP ARTHROPLASTY: SHX124

## 2017-11-17 SURGERY — ARTHROPLASTY, HIP, TOTAL,POSTERIOR APPROACH
Anesthesia: General | Site: Hip | Laterality: Right | Wound class: Clean

## 2017-11-17 MED ORDER — FLEET ENEMA 7-19 GM/118ML RE ENEM: 1.0000 | ENEMA | Freq: Once | RECTAL | Status: DC | PRN

## 2017-11-17 MED ORDER — ACETAMINOPHEN 10 MG/ML IV SOLN
INTRAVENOUS | Status: AC
  Filled 2017-11-17: qty 100

## 2017-11-17 MED ORDER — MENTHOL 3 MG MT LOZG: 1.0000 | LOZENGE | OROMUCOSAL | Status: DC | PRN

## 2017-11-17 MED ORDER — PROPOFOL 500 MG/50ML IV EMUL
INTRAVENOUS | Status: AC
  Filled 2017-11-17: qty 50

## 2017-11-17 MED ORDER — ADULT MULTIVITAMIN W/MINERALS CH
1.0000 | ORAL_TABLET | Freq: Every day | ORAL | Status: DC
  Administered 2017-11-18 – 2017-11-19 (×2): 1 via ORAL
  Filled 2017-11-17 (×4): qty 1

## 2017-11-17 MED ORDER — FENTANYL CITRATE (PF) 100 MCG/2ML IJ SOLN
INTRAMUSCULAR | Status: AC
  Filled 2017-11-17: qty 2

## 2017-11-17 MED ORDER — LIDOCAINE HCL (CARDIAC) 20 MG/ML IV SOLN
INTRAVENOUS | Status: DC | PRN
Start: 2017-11-17 — End: 2017-11-17
  Administered 2017-11-17: 50 mg via INTRAVENOUS

## 2017-11-17 MED ORDER — FERROUS SULFATE 325 (65 FE) MG PO TABS
325.0000 mg | ORAL_TABLET | Freq: Two times a day (BID) | ORAL | Status: DC
  Administered 2017-11-17 – 2017-11-19 (×4): 325 mg via ORAL
  Filled 2017-11-17 (×3): qty 1

## 2017-11-17 MED ORDER — ENOXAPARIN SODIUM 30 MG/0.3ML ~~LOC~~ SOLN
30.0000 mg | Freq: Two times a day (BID) | SUBCUTANEOUS | Status: DC
  Administered 2017-11-18 – 2017-11-19 (×3): 30 mg via SUBCUTANEOUS
  Filled 2017-11-17 (×3): qty 0.3

## 2017-11-17 MED ORDER — PHENYLEPHRINE HCL 10 MG/ML IJ SOLN
INTRAMUSCULAR | Status: AC
  Filled 2017-11-17: qty 1

## 2017-11-17 MED ORDER — LACTATED RINGERS IV SOLN
INTRAVENOUS | Status: DC
  Administered 2017-11-17: 11:00:00 via INTRAVENOUS
  Administered 2017-11-17: 1000 mL via INTRAVENOUS

## 2017-11-17 MED ORDER — PANTOPRAZOLE SODIUM 40 MG PO TBEC
40.0000 mg | DELAYED_RELEASE_TABLET | Freq: Two times a day (BID) | ORAL | Status: DC
  Administered 2017-11-17 – 2017-11-19 (×4): 40 mg via ORAL
  Filled 2017-11-17 (×4): qty 1

## 2017-11-17 MED ORDER — FENTANYL CITRATE (PF) 100 MCG/2ML IJ SOLN
INTRAMUSCULAR | Status: AC
  Administered 2017-11-17: 25 ug via INTRAVENOUS
  Filled 2017-11-17: qty 2

## 2017-11-17 MED ORDER — PROPOFOL 10 MG/ML IV BOLUS
INTRAVENOUS | Status: AC
  Filled 2017-11-17: qty 40

## 2017-11-17 MED ORDER — ACETAMINOPHEN 10 MG/ML IV SOLN
INTRAVENOUS | Status: DC | PRN
  Administered 2017-11-17: 1000 mg via INTRAVENOUS

## 2017-11-17 MED ORDER — POTASSIUM CHLORIDE 20 MEQ/15ML (10%) PO SOLN
5.0000 meq | Freq: Every day | ORAL | Status: DC
  Filled 2017-11-17 (×3): qty 7.5

## 2017-11-17 MED ORDER — NEOMYCIN-POLYMYXIN B GU 40-200000 IR SOLN
Status: AC
  Filled 2017-11-17: qty 20

## 2017-11-17 MED ORDER — ONDANSETRON HCL 4 MG PO TABS: 4.0000 mg | ORAL_TABLET | Freq: Four times a day (QID) | ORAL | Status: DC | PRN

## 2017-11-17 MED ORDER — METOCLOPRAMIDE HCL 10 MG PO TABS
10.0000 mg | ORAL_TABLET | Freq: Three times a day (TID) | ORAL | Status: DC
  Administered 2017-11-17 – 2017-11-19 (×7): 10 mg via ORAL
  Filled 2017-11-17 (×6): qty 1

## 2017-11-17 MED ORDER — PROPOFOL 10 MG/ML IV BOLUS
INTRAVENOUS | Status: AC
  Filled 2017-11-17: qty 20

## 2017-11-17 MED ORDER — TRAMADOL HCL 50 MG PO TABS
50.0000 mg | ORAL_TABLET | ORAL | Status: DC | PRN
  Administered 2017-11-17: 100 mg via ORAL
  Filled 2017-11-17: qty 2

## 2017-11-17 MED ORDER — NEOMYCIN-POLYMYXIN B GU 40-200000 IR SOLN
Status: DC | PRN
  Administered 2017-11-17: 14 mL

## 2017-11-17 MED ORDER — FENTANYL CITRATE (PF) 100 MCG/2ML IJ SOLN
INTRAMUSCULAR | Status: DC | PRN
  Administered 2017-11-17 (×2): 50 ug via INTRAVENOUS

## 2017-11-17 MED ORDER — SENNOSIDES-DOCUSATE SODIUM 8.6-50 MG PO TABS
1.0000 | ORAL_TABLET | Freq: Two times a day (BID) | ORAL | Status: DC
  Administered 2017-11-17 – 2017-11-18 (×3): 1 via ORAL
  Filled 2017-11-17 (×4): qty 1

## 2017-11-17 MED ORDER — LIDOCAINE HCL (PF) 2 % IJ SOLN
INTRAMUSCULAR | Status: AC
  Filled 2017-11-17: qty 10

## 2017-11-17 MED ORDER — POTASSIUM 99 MG PO TABS: 2.0000 | ORAL_TABLET | Freq: Every day | ORAL | Status: DC

## 2017-11-17 MED ORDER — CHLORHEXIDINE GLUCONATE 4 % EX LIQD: 60.0000 mL | Freq: Once | CUTANEOUS | Status: DC

## 2017-11-17 MED ORDER — AMLODIPINE BESYLATE 5 MG PO TABS
5.0000 mg | ORAL_TABLET | Freq: Every morning | ORAL | Status: DC
  Administered 2017-11-18 – 2017-11-19 (×2): 5 mg via ORAL
  Filled 2017-11-17 (×2): qty 1

## 2017-11-17 MED ORDER — OXYCODONE HCL 5 MG PO TABS
5.0000 mg | ORAL_TABLET | ORAL | Status: DC | PRN
  Administered 2017-11-17 – 2017-11-18 (×3): 5 mg via ORAL
  Filled 2017-11-17 (×3): qty 1

## 2017-11-17 MED ORDER — ACETAMINOPHEN 650 MG RE SUPP: 650.0000 mg | RECTAL | Status: DC | PRN

## 2017-11-17 MED ORDER — ONDANSETRON HCL 4 MG/2ML IJ SOLN: 4.0000 mg | Freq: Four times a day (QID) | INTRAMUSCULAR | Status: DC | PRN

## 2017-11-17 MED ORDER — FUROSEMIDE 40 MG PO TABS
40.0000 mg | ORAL_TABLET | Freq: Every morning | ORAL | Status: DC
  Administered 2017-11-18 – 2017-11-19 (×2): 40 mg via ORAL
  Filled 2017-11-17 (×2): qty 1

## 2017-11-17 MED ORDER — KETAMINE HCL 50 MG/ML IJ SOLN
INTRAMUSCULAR | Status: DC | PRN
  Administered 2017-11-17 (×2): 50 mg via INTRAMUSCULAR

## 2017-11-17 MED ORDER — FAMOTIDINE 20 MG PO TABS
20.0000 mg | ORAL_TABLET | Freq: Once | ORAL | Status: AC
  Administered 2017-11-17: 20 mg via ORAL

## 2017-11-17 MED ORDER — TRANEXAMIC ACID 1000 MG/10ML IV SOLN
1000.0000 mg | Freq: Once | INTRAVENOUS | Status: AC
  Administered 2017-11-17: 1000 mg via INTRAVENOUS
  Filled 2017-11-17: qty 10

## 2017-11-17 MED ORDER — PROPOFOL 500 MG/50ML IV EMUL
INTRAVENOUS | Status: DC | PRN
  Administered 2017-11-17: 75 ug/kg/min via INTRAVENOUS

## 2017-11-17 MED ORDER — CELECOXIB 200 MG PO CAPS
200.0000 mg | ORAL_CAPSULE | Freq: Two times a day (BID) | ORAL | Status: DC
  Administered 2017-11-17 – 2017-11-19 (×5): 200 mg via ORAL
  Filled 2017-11-17 (×5): qty 1

## 2017-11-17 MED ORDER — KETAMINE HCL 50 MG/ML IJ SOLN
INTRAMUSCULAR | Status: AC
  Filled 2017-11-17: qty 10

## 2017-11-17 MED ORDER — FENTANYL CITRATE (PF) 100 MCG/2ML IJ SOLN
25.0000 ug | INTRAMUSCULAR | Status: DC | PRN
  Administered 2017-11-17 (×3): 25 ug via INTRAVENOUS

## 2017-11-17 MED ORDER — DIPHENHYDRAMINE HCL 12.5 MG/5ML PO ELIX: 12.5000 mg | ORAL_SOLUTION | ORAL | Status: DC | PRN

## 2017-11-17 MED ORDER — SODIUM CHLORIDE 0.9 % IV SOLN
INTRAVENOUS | Status: DC | PRN
  Administered 2017-11-17: 25 ug/min via INTRAVENOUS

## 2017-11-17 MED ORDER — ONDANSETRON HCL 4 MG/2ML IJ SOLN: 4.0000 mg | Freq: Once | INTRAMUSCULAR | Status: DC | PRN

## 2017-11-17 MED ORDER — DEXTROSE 5 % IV SOLN
2.0000 g | Freq: Four times a day (QID) | INTRAVENOUS | Status: AC
  Administered 2017-11-17 – 2017-11-18 (×4): 2 g via INTRAVENOUS
  Filled 2017-11-17 (×4): qty 20

## 2017-11-17 MED ORDER — SUCCINYLCHOLINE CHLORIDE 20 MG/ML IJ SOLN
INTRAMUSCULAR | Status: AC
  Filled 2017-11-17: qty 1

## 2017-11-17 MED ORDER — PHENOL 1.4 % MT LIQD: 1.0000 | OROMUCOSAL | Status: DC | PRN

## 2017-11-17 MED ORDER — VITAMIN C 500 MG PO TABS
1000.0000 mg | ORAL_TABLET | Freq: Every day | ORAL | Status: DC
  Administered 2017-11-18 – 2017-11-19 (×2): 1000 mg via ORAL
  Filled 2017-11-17 (×3): qty 2

## 2017-11-17 MED ORDER — ALUM & MAG HYDROXIDE-SIMETH 200-200-20 MG/5ML PO SUSP: 30.0000 mL | ORAL | Status: DC | PRN

## 2017-11-17 MED ORDER — ACETAMINOPHEN 10 MG/ML IV SOLN
1000.0000 mg | Freq: Four times a day (QID) | INTRAVENOUS | Status: AC
  Administered 2017-11-17 – 2017-11-18 (×4): 1000 mg via INTRAVENOUS
  Filled 2017-11-17 (×4): qty 100

## 2017-11-17 MED ORDER — ACETAMINOPHEN 325 MG PO TABS: 650.0000 mg | ORAL_TABLET | ORAL | Status: DC | PRN

## 2017-11-17 MED ORDER — MIDAZOLAM HCL 2 MG/2ML IJ SOLN
INTRAMUSCULAR | Status: AC
  Filled 2017-11-17: qty 2

## 2017-11-17 MED ORDER — OXYCODONE HCL 5 MG PO TABS
10.0000 mg | ORAL_TABLET | ORAL | Status: DC | PRN
  Administered 2017-11-17 – 2017-11-18 (×3): 10 mg via ORAL
  Filled 2017-11-17 (×3): qty 2

## 2017-11-17 MED ORDER — MORPHINE SULFATE (PF) 2 MG/ML IV SOLN: 2.0000 mg | INTRAVENOUS | Status: DC | PRN

## 2017-11-17 MED ORDER — MIDAZOLAM HCL 5 MG/5ML IJ SOLN
INTRAMUSCULAR | Status: DC | PRN
  Administered 2017-11-17: 2 mg via INTRAVENOUS

## 2017-11-17 MED ORDER — BISACODYL 10 MG RE SUPP: 10.0000 mg | Freq: Every day | RECTAL | Status: DC | PRN

## 2017-11-17 MED ORDER — MAGNESIUM HYDROXIDE 400 MG/5ML PO SUSP
30.0000 mL | Freq: Every day | ORAL | Status: DC | PRN
  Administered 2017-11-18: 30 mL via ORAL
  Filled 2017-11-17: qty 30

## 2017-11-17 MED ORDER — SODIUM CHLORIDE 0.9 % IV SOLN
INTRAVENOUS | Status: DC
  Administered 2017-11-17 (×2): via INTRAVENOUS

## 2017-11-17 SURGICAL SUPPLY — 60 items
BLADE DRUM FLTD (BLADE) ×2 IMPLANT
BLADE SAW 1 (BLADE) ×2 IMPLANT
BNDG COHESIVE 6X5 TAN STRL LF (GAUZE/BANDAGES/DRESSINGS) ×2 IMPLANT
CANISTER SUCT 1200ML W/VALVE (MISCELLANEOUS) ×2 IMPLANT
CANISTER SUCT 3000ML PPV (MISCELLANEOUS) ×4 IMPLANT
CAPT HIP TOTAL 2 ×2 IMPLANT
CARTRIDGE OIL MAESTRO DRILL (MISCELLANEOUS) ×1 IMPLANT
DIFFUSER DRILL AIR PNEUMATIC (MISCELLANEOUS) ×2 IMPLANT
DRAPE INCISE IOBAN 66X60 STRL (DRAPES) ×4 IMPLANT
DRAPE SHEET LG 3/4 BI-LAMINATE (DRAPES) ×2 IMPLANT
DRSG DERMACEA 8X12 NADH (GAUZE/BANDAGES/DRESSINGS) ×2 IMPLANT
DRSG OPSITE POSTOP 4X12 (GAUZE/BANDAGES/DRESSINGS) IMPLANT
DRSG OPSITE POSTOP 4X14 (GAUZE/BANDAGES/DRESSINGS) ×2 IMPLANT
DRSG TEGADERM 4X4.75 (GAUZE/BANDAGES/DRESSINGS) ×2 IMPLANT
DRSG TELFA 4X3 1S NADH ST (GAUZE/BANDAGES/DRESSINGS) ×2 IMPLANT
DURAPREP 26ML APPLICATOR (WOUND CARE) ×6 IMPLANT
ELECT BLADE 6.5 EXT (BLADE) ×2 IMPLANT
ELECT CAUTERY BLADE 6.4 (BLADE) ×2 IMPLANT
EVACUATOR 1/8 PVC DRAIN (DRAIN) ×2 IMPLANT
GLOVE BIO SURGEON STRL SZ 6.5 (GLOVE) ×8 IMPLANT
GLOVE BIOGEL M STRL SZ7.5 (GLOVE) ×4 IMPLANT
GLOVE BIOGEL PI IND STRL 7.0 (GLOVE) ×5 IMPLANT
GLOVE BIOGEL PI IND STRL 9 (GLOVE) ×1 IMPLANT
GLOVE BIOGEL PI INDICATOR 7.0 (GLOVE) ×5
GLOVE BIOGEL PI INDICATOR 9 (GLOVE) ×1
GLOVE INDICATOR 8.0 STRL GRN (GLOVE) ×2 IMPLANT
GLOVE SURG SYN 9.0  PF PI (GLOVE) ×1
GLOVE SURG SYN 9.0 PF PI (GLOVE) ×1 IMPLANT
GOWN STRL REUS W/ TWL LRG LVL3 (GOWN DISPOSABLE) ×3 IMPLANT
GOWN STRL REUS W/TWL 2XL LVL3 (GOWN DISPOSABLE) ×2 IMPLANT
GOWN STRL REUS W/TWL LRG LVL3 (GOWN DISPOSABLE) ×3
HOLDER FOLEY CATH W/STRAP (MISCELLANEOUS) ×2 IMPLANT
HOOD PEEL AWAY FLYTE STAYCOOL (MISCELLANEOUS) ×4 IMPLANT
KIT RM TURNOVER STRD PROC AR (KITS) ×2 IMPLANT
NDL SAFETY ECLIPSE 18X1.5 (NEEDLE) ×1 IMPLANT
NEEDLE HYPO 18GX1.5 SHARP (NEEDLE) ×1
NS IRRIG 500ML POUR BTL (IV SOLUTION) ×2 IMPLANT
OIL CARTRIDGE MAESTRO DRILL (MISCELLANEOUS) ×2
PACK HIP PROSTHESIS (MISCELLANEOUS) ×2 IMPLANT
PIN STEIN THRED 5/32 (Pin) ×2 IMPLANT
PULSAVAC PLUS IRRIG FAN TIP (DISPOSABLE) ×2
SOL .9 NS 3000ML IRR  AL (IV SOLUTION) ×1
SOL .9 NS 3000ML IRR UROMATIC (IV SOLUTION) ×1 IMPLANT
SOL PREP PVP 2OZ (MISCELLANEOUS) ×2
SOLUTION PREP PVP 2OZ (MISCELLANEOUS) ×1 IMPLANT
SPONGE DRAIN TRACH 4X4 STRL 2S (GAUZE/BANDAGES/DRESSINGS) ×2 IMPLANT
STAPLER SKIN PROX 35W (STAPLE) ×2 IMPLANT
SUCTION FRAZIER HANDLE 10FR (MISCELLANEOUS) ×1
SUCTION TUBE FRAZIER 10FR DISP (MISCELLANEOUS) ×1 IMPLANT
SUT ETHIBOND #5 BRAIDED 30INL (SUTURE) ×2 IMPLANT
SUT VIC AB 0 CT1 36 (SUTURE) ×4 IMPLANT
SUT VIC AB 1 CT1 36 (SUTURE) ×4 IMPLANT
SUT VIC AB 2-0 CT1 27 (SUTURE) ×1
SUT VIC AB 2-0 CT1 TAPERPNT 27 (SUTURE) ×1 IMPLANT
SYR 20CC LL (SYRINGE) ×2 IMPLANT
TAPE ADH 3 LX (MISCELLANEOUS) ×2 IMPLANT
TAPE TRANSPORE STRL 2 31045 (GAUZE/BANDAGES/DRESSINGS) ×2 IMPLANT
TIP FAN IRRIG PULSAVAC PLUS (DISPOSABLE) ×1 IMPLANT
TOWEL OR 17X26 4PK STRL BLUE (TOWEL DISPOSABLE) ×2 IMPLANT
TRAY FOLEY W/METER SILVER 16FR (SET/KITS/TRAYS/PACK) ×2 IMPLANT

## 2017-11-17 NOTE — Evaluation (Signed)
Physical Therapy Evaluation Patient Details Name: Noland FordyceRenee L Schindler MRN: [id number] DOB: 02/08/1961 Today's Date: 11/17/2017   History of Present Illness  admitted for acute hospitalization status post R THA (11/17/17), WBAT, post THPs  Clinical Impression  Upon arrival to room, patient sleeping, but easily arousable.  Agreeable to session with gentle encouragement.  R LE strength/ROM generally limited by pain, but [facility] for basic transfers and mobility. Sensation fully returned/intact.  Patient with moderate reports of 'pulling' in R LE, esp with hip extension towards neutral (had been resting with pillow under R knee).  Pillow removed and extremity in flat position end of session. Demonstrates ability to complete bed mobility with min assist; sit/stand, basic transfers and gait (5') with RW, cga. Decreased active use of R LE with movement transitions and gait, but good weight acceptance and control noted with all WBing activities. Would benefit from skilled PT to address above deficits and promote optimal return to PLOF; recommend transition to home with outpatient PT follow up as medically appropriate.    Follow Up Recommendations Outpatient PT    Equipment Recommendations       Recommendations for Other Services       Precautions / Restrictions Precautions Precautions: Posterior Hip;Fall Required Braces or Orthoses: Knee Immobilizer - Right Restrictions Weight Bearing Restrictions: Yes RLE Weight Bearing: Weight bearing as tolerated      Mobility  Bed Mobility Overal bed mobility: Needs Assistance Bed Mobility: Supine to Sit     Supine to sit: Min assist        Transfers Overall transfer level: Needs assistance Equipment used: Rolling walker (2 wheeled) Transfers: Sit to/from Stand Sit to Stand: Min guard         General transfer comment: maintains R LE anterior to BOS, excessive weight shift to L LE; heavy use of UEs to complee  Ambulation/Gait Ambulation/Gait  assistance: Min guard Ambulation Distance (Feet): 5 Feet Assistive device: Rolling walker (2 wheeled)       General Gait Details: step to gait pattern with good R LE stance time, weight acceptance and overal control; additional activity limited by pain  Stairs            Wheelchair Mobility    Modified Rankin (Stroke Patients Only)       Balance Overall balance assessment: Needs assistance Sitting-balance support: No upper extremity supported;Feet supported Sitting balance-Leahy Scale: Normal     Standing balance support: Bilateral upper extremity supported Standing balance-Leahy Scale: Fair                               Pertinent Vitals/Pain Pain Assessment: 0-10 Pain Score: 6  Pain Location: R hip Pain Descriptors / Indicators: Aching;Grimacing;Guarding Pain Intervention(s): Limited activity within patient's tolerance;Monitored during session;Premedicated before session;Repositioned    Home Living Family/patient expects to be discharged to:: Private residence Living Arrangements: Children Available Help at Discharge: Family Type of Home: House Home Access: Stairs to enter Entrance Stairs-Rails: None Entrance Stairs-Number of Steps: 1 Home Layout: Two level;Bed/bath upstairs Home Equipment: Environmental consultantWalker - 2 wheels;Cane - single point      Prior Function Level of Independence: Independent         Comments: Indep with ADLs, household and community mobilization; denies fall history.     Hand Dominance        Extremity/Trunk Assessment   Upper Extremity Assessment Upper Extremity Assessment: Overall WFL for tasks assessed    Lower Extremity Assessment Lower  Extremity Assessment: Generalized weakness(R hip grossly 3-/5, limited by pain; otherwise, bilat LEs grossly WFL)       Communication   Communication: No difficulties  Cognition Arousal/Alertness: Awake/alert Behavior During Therapy: WFL for tasks assessed/performed Overall  Cognitive Status: Within Functional Limits for tasks assessed                                        General Comments      Exercises Other Exercises Other Exercises: Supine LE therex, 1x10, act vs act assist ROM: ankle pumps, quad sets, SAQs, heel slides, hip abduct/adduct. Assist for movement in all planes due to pain Other Exercises: Reviewed posterior THPs; patient voiced understanding.  Will continue to reinforce throughout saty   Assessment/Plan    PT Assessment Patient needs continued PT services  PT Problem List Decreased strength;Decreased range of motion;Decreased activity tolerance;Decreased balance;Decreased mobility;Decreased knowledge of use of DME;Decreased safety awareness;Decreased knowledge of precautions;Pain       PT Treatment Interventions DME instruction;Gait training;Stair training;Functional mobility training;Therapeutic activities;Therapeutic exercise;Patient/family education    PT Goals (Current goals can be found in the Care Plan section)  Acute Rehab PT Goals Patient Stated Goal: to return home PT Goal Formulation: With patient Time For Goal Achievement: 12/01/17 Potential to Achieve Goals: Good    Frequency BID   Barriers to discharge        Co-evaluation               AM-PAC PT "6 Clicks" Daily Activity  Outcome Measure Difficulty turning over in bed (including adjusting bedclothes, sheets and blankets)?: Unable Difficulty moving from lying on back to sitting on the side of the bed? : Unable Difficulty sitting down on and standing up from a chair with arms (e.g., wheelchair, bedside commode, etc,.)?: Unable Help needed moving to and from a bed to chair (including a wheelchair)?: A Little Help needed walking in hospital room?: A Little Help needed climbing 3-5 steps with a railing? : A Little 6 Click Score: 12    End of Session Equipment Utilized During Treatment: Gait belt Activity Tolerance: Patient tolerated  treatment well;Patient limited by pain Patient left: in chair;with chair alarm set;with call bell/phone within reach Nurse Communication: Mobility status PT Visit Diagnosis: Muscle weakness (generalized) (M62.81);Difficulty in walking, not elsewhere classified (R26.2);Pain Pain - Right/Left: Right Pain - part of body: Hip    Time: 1540-1603 PT Time Calculation (min) (ACUTE ONLY): 23 min   Charges:   PT Evaluation $PT Eval Low Complexity: 1 Low PT Treatments $Therapeutic Exercise: 8-22 mins   PT G Codes:   PT G-Codes **NOT FOR INPATIENT CLASS** Functional Assessment Tool Used: AM-PAC 6 Clicks Basic Mobility Functional Limitation: Mobility: Walking and moving around Mobility: Walking and Moving Around Current Status ([id number](G8978): At least 20 percent but less than 40 percent impaired, limited or restricted Mobility: Walking and Moving Around Goal Status [phone](G8979): At least 1 percent but less than 20 percent impaired, limited or restricted       Dariyon Urquilla H. Manson PasseyBrown, PT, DPT, NCS 11/17/17, 9:46 PM [phone]

## 2017-11-17 NOTE — Op Note (Signed)
OPERATIVE NOTE  DATE OF SURGERY:  11/17/2017  PATIENT NAME:  Tamara Dodson   DOB: 02/13/1961  MRN: [id number]  PRE-OPERATIVE DIAGNOSIS: Degenerative arthrosis of the right hip, primary  POST-OPERATIVE DIAGNOSIS:  Same  PROCEDURE:  Right total hip arthroplasty  SURGEON:  Jena GaussJames P Danner Paulding, Jr. M.D.  ASSISTANT:  Van ClinesJon Wolfe, PA (present and scrubbed throughout the case, critical for assistance with exposure, retraction, instrumentation, and closure)  ANESTHESIA: spinal  ESTIMATED BLOOD LOSS: 400 mL  FLUIDS REPLACED: 1100 mL of crystalloid  DRAINS: 2 medium drains to a Hemovac reservoir  IMPLANTS UTILIZED: DePuy 16.5 mm small stature AML femoral stem, 52 mm OD Pinnacle 100 acetabular component, +4 mm neutral Pinnacle Marathon polyethylene insert, and a 36 mm M-SPEC +1.5 mm hip ball  INDICATIONS FOR SURGERY: Tamara Dodson is a 56 y.o. year old female with a long history of progressive hip and groin  pain. X-rays demonstrated severe degenerative changes. The patient had not seen any significant improvement despite conservative nonsurgical intervention. After discussion of the risks and benefits of surgical intervention, the patient expressed understanding of the risks benefits and agree with plans for total hip arthroplasty.   The risks, benefits, and alternatives were discussed at length including but not limited to the risks of infection, bleeding, nerve injury, stiffness, blood clots, the need for revision surgery, limb length inequality, dislocation, cardiopulmonary complications, among others, and they were willing to proceed.  PROCEDURE IN DETAIL: The patient was brought into the operating room and, after adequate spinal anesthesia was achieved, the patient was placed in a left lateral decubitus position. Axillary roll was placed and all bony prominences were well-padded. The patient's right hip was cleaned and prepped with alcohol and DuraPrep and draped in the usual sterile fashion. A  "timeout" was performed as per usual protocol. A lateral curvilinear incision was made gently curving towards the posterior superior iliac spine. The IT band was incised in line with the skin incision and the fibers of the gluteus maximus were split in line. The piriformis tendon was identified, skeletonized, and incised at its insertion to the proximal femur and reflected posteriorly. A T type posterior capsulotomy was performed. Prior to dislocation of the femoral head, a threaded Steinmann pin was inserted through a separate stab incision into the pelvis superior to the acetabulum and bent in the form of a stylus so as to assess limb length and hip offset throughout the procedure. The femoral head was then dislocated posteriorly. Inspection of the femoral head demonstrated severe degenerative changes with full-thickness loss of articular cartilage. The femoral neck cut was performed using an oscillating saw. The anterior capsule was elevated off of the femoral neck using a periosteal elevator. Attention was then directed to the acetabulum. The remnant of the labrum was excised using electrocautery. Inspection of the acetabulum also demonstrated significant degenerative changes. The acetabulum was reamed in sequential fashion up to a 51 mm diameter. Good punctate bleeding bone was encountered. A 52 mm Pinnacle 100 acetabular component was positioned and impacted into place. Good scratch fit was appreciated. A 4 mm neutral polyethylene trial was inserted.  Attention was then directed to the proximal femur. A hole for reaming of the proximal femoral canal was created using a high-speed burr. The femoral canal was reamed in sequential fashion up to a 16 mm diameter. This allowed for approximately 6 cm of scratch fit. Serial broaches were inserted up to a 16.5 mm small stature femoral broach. Calcar region was planed and a  trial reduction was performed using a 36 mm hip ball with a +1.5 mm neck length. Good  equalization of limb lengths and hip offset was appreciated and excellent stability was noted both anteriorly and posteriorly. Trial components were removed. The acetabular shell was irrigated with copious amounts of normal saline with antibiotic solution and suctioned dry. A +4 mm neutral Pinnacle Marathon polyethylene insert was positioned and impacted into place. Next, a 16.5 mm small stature AML femoral stem was positioned and impacted into place. Excellent scratch fit was appreciated. A trial reduction was again performed with a 36 mm hip ball with a +1.5 mm neck length. Again, good equalization of limb lengths was appreciated and excellent stability appreciated both anteriorly and posteriorly. The hip was then dislocated and the trial hip ball was removed. The Morse taper was cleaned and dried. A 36 mm M-SPEC hip ball with a +1.5 mm neck length was placed on the trunnion and impacted into place. The hip was then reduced and placed through range of motion. Excellent stability was appreciated both anteriorly and posteriorly.  The wound was irrigated with copious amounts of normal saline with antibiotic solution and suctioned dry. Good hemostasis was appreciated. The posterior capsulotomy was repaired using #5 Ethibond. Piriformis tendon was reapproximated to the undersurface of the gluteus medius tendon using #5 Ethibond. Two medium drains were placed in the wound bed and brought out through separate stab incisions to be attached to a Hemovac reservoir. The IT band was reapproximated using interrupted sutures of #1 Vicryl. Subcutaneous tissue was approximated using first #0 Vicryl followed by #2-0 Vicryl. The skin was closed with skin staples.  The patient tolerated the procedure well and was transported to the recovery room in stable condition.   Jena GaussJames P Shakeila Pfarr, Jr., M.D.

## 2017-11-17 NOTE — Anesthesia Post-op Follow-up Note (Signed)
Anesthesia QCDR form completed.        

## 2017-11-17 NOTE — Discharge Instructions (Signed)
Instructions after Total Hip Replacement ° ° °  Nazyia Gaugh P. Kazden Largo, Jr., M.D.    ° Dept. of Orthopaedics & Sports Medicine ° [facility] ° [address] ° [place],   [address] ° Phone: [phone]   Fax: [phone] ° °  °DIET: °• Drink plenty of non-alcoholic fluids. °• Resume your normal diet. Include foods high in fiber. ° °ACTIVITY:  °• You may use crutches or a walker with weight-bearing as tolerated, unless instructed otherwise. °• You may be weaned off of the walker or crutches by your Physical Therapist.  °• Do NOT reach below the level of your knees or cross your legs until allowed.    °• Continue doing gentle exercises. Exercising will reduce the pain and swelling, increase motion, and prevent muscle weakness.   °• Please continue to use the TED compression stockings for 6 weeks. You may remove the stockings at night, but should reapply them in the morning. °• Do not drive or operate any equipment until instructed. ° °WOUND CARE:  °• Continue to use ice packs periodically to reduce pain and swelling. °• Keep the incision clean and dry. °• You may bathe or shower after the staples are removed at the first office visit following surgery. ° °MEDICATIONS: °• You may resume your regular medications. °• Please take the pain medication as prescribed on the medication. °• Do not take pain medication on an empty stomach. °• You have been given a prescription for a blood thinner to prevent blood clots. Please take the medication as instructed. (NOTE: After completing a 2 week course of Lovenox, take one Enteric-coated aspirin once a day.) °• Pain medications and iron supplements can cause constipation. Use a stool softener (Senokot or Colace) on a daily basis and a laxative (dulcolax or miralax) as needed. °• Do not drive or drink alcoholic beverages when taking pain medications. ° °CALL THE OFFICE FOR: °• Temperature above 101 degrees °• Excessive bleeding or drainage on the dressing. °• Excessive  swelling, coldness, or paleness of the toes. °• Persistent nausea and vomiting. ° °FOLLOW-UP:  °• You should have an appointment to return to the office in 6 weeks after surgery. °• Arrangements have been made for continuation of Physical Therapy (either home therapy or outpatient therapy). °  °

## 2017-11-17 NOTE — Progress Notes (Signed)
Patient is A&O x4. Up with assist x1 with Clorox CompanyWW. Foley draining clear yellow urine. Dressing and drain intact. IV fluids running. Up to chair for a few hours this shift, tolerated well. Alos, tolerated clear liquid, advanced diet. Oral pain meds given, with relief. IS encouraged. Bed/alarm in place for safety.

## 2017-11-17 NOTE — Transfer of Care (Signed)
Immediate Anesthesia Transfer of Care Note  Patient: Tamara FordyceRenee L Erven  Procedure(s) Performed: TOTAL HIP ARTHROPLASTY (Right Hip)  Patient Location: PACU  Anesthesia Type:Spinal  Level of Consciousness: sedated and responds to stimulation  Airway & Oxygen Therapy: Patient Spontanous Breathing and Patient connected to face mask oxygen  Post-op Assessment: Report given to RN and Post -op Vital signs reviewed and stable  Post vital signs: Reviewed and stable  Last Vitals:  Vitals:   11/17/17 0620 11/17/17 1141  BP: (!) 143/70 123/75  Pulse: 80 74  Resp: 17   Temp: (!) 36.2 C   SpO2: 99% 100%    Last Pain:  Vitals:   11/17/17 0620  TempSrc: Tympanic  PainSc: 10-Worst pain ever         Complications: No apparent anesthesia complications

## 2017-11-17 NOTE — [place] FL2 (Signed)
[place] MEDICAID FL2 LEVEL OF CARE SCREENING TOOL     IDENTIFICATION  Patient Name: Tamara FordyceRenee L Dodson Birthdate: 12/30/1960 Sex: female Admission Date (Current Location): 11/17/2017  [place]   Facility and Address:  [facility], [address], [place], [place] [address]      Provider Number: [id number]  Attending Physician Name and Address:  Donato HeinzHooten, James P, MD  Relative Name and Phone Number:       Current Level of Care: Hospital Recommended Level of Care: Skilled Nursing Facility Prior Approval Number:    Date Approved/Denied:   PASRR Number: ([id number] [id number])  Discharge Plan: SNF    Current Diagnoses: Patient Active Problem List   Diagnosis Date Noted  . Low back pain 11/17/2017  . Morbid obesity (HCC) 11/17/2017  . Status post total replacement of hip 11/17/2017  . Status post total replacement of left hip 04/02/2017  . Total knee replacement status 09/21/2015  . Benign essential hypertension 09/14/2015  . Lymphedema in adult patient 09/14/2015    Orientation RESPIRATION BLADDER Height & Weight     Self, Time, Situation, Place  Normal Continent Weight: (!) 307 lb 3.2 oz (139.3 kg) Height:  5\' 10"  (177.8 cm)  BEHAVIORAL SYMPTOMS/MOOD NEUROLOGICAL BOWEL NUTRITION STATUS      Continent Diet(Diet: Clear Liquid to be Advanced. )  AMBULATORY STATUS COMMUNICATION OF NEEDS Skin   Extensive Assist Verbally Surgical wounds(Incision: Right Hip. )                       Personal Care Assistance Level of Assistance  Bathing, Feeding, Dressing Bathing Assistance: Limited assistance Feeding assistance: Independent Dressing Assistance: Limited assistance     Functional Limitations Info  Sight, Hearing, Speech Sight Info: Impaired Hearing Info: Adequate Speech Info: Adequate    SPECIAL CARE FACTORS FREQUENCY  PT (By licensed PT), OT (By licensed OT)     PT Frequency: (5) OT Frequency: (5)             Contractures      Additional Factors Info  Code Status, Allergies Code Status Info: (Full Code. ) Allergies Info: (No Known Allergies. )           Current Medications (11/17/2017):  This is the current hospital active medication list Current Facility-Administered Medications  Medication Dose Route Frequency Provider Last Rate Last Dose  . 0.9 %  sodium chloride infusion   Intravenous Continuous Hooten, Illene LabradorJames P, MD 100 mL/hr at 11/17/17 1500    . acetaminophen (OFIRMEV) IV 1,000 mg  1,000 mg Intravenous Q6H Hooten, Illene LabradorJames P, MD   Stopped at 11/17/17 1504  . acetaminophen (TYLENOL) tablet 650 mg  650 mg Oral Q4H PRN Hooten, Illene LabradorJames P, MD       Or  . acetaminophen (TYLENOL) suppository 650 mg  650 mg Rectal Q4H PRN Hooten, Illene LabradorJames P, MD      . alum & mag hydroxide-simeth (MAALOX/MYLANTA) 200-200-20 MG/5ML suspension 30 mL  30 mL Oral Q4H PRN Hooten, Illene LabradorJames P, MD      . Melene Muller[START ON 11/18/2017] amLODipine (NORVASC) tablet 5 mg  5 mg Oral q morning - 10a Hooten, Illene LabradorJames P, MD      . bisacodyl (DULCOLAX) suppository 10 mg  10 mg Rectal Daily PRN Hooten, Illene LabradorJames P, MD      . ceFAZolin (ANCEF) 2 g in dextrose 5 % 100 mL IVPB  2 g Intravenous Q6H Hooten, Illene LabradorJames P, MD   Stopped at 11/17/17  1519  . celecoxib (CELEBREX) capsule 200 mg  200 mg Oral Q12H Hooten, Illene LabradorJames P, MD   200 mg at 11/17/17 1448  . diphenhydrAMINE (BENADRYL) 12.5 MG/5ML elixir 12.5-25 mg  12.5-25 mg Oral Q4H PRN Hooten, Illene LabradorJames P, MD      . Melene Muller[START ON 11/18/2017] enoxaparin (LOVENOX) injection 30 mg  30 mg Subcutaneous Q12H Hooten, Illene LabradorJames P, MD      . ferrous sulfate tablet 325 mg  325 mg Oral BID WC Hooten, Illene LabradorJames P, MD      . furosemide (LASIX) tablet 40 mg  40 mg Oral q morning - 10a Hooten, Illene LabradorJames P, MD      . magnesium hydroxide (MILK OF MAGNESIA) suspension 30 mL  30 mL Oral Daily PRN Hooten, Illene LabradorJames P, MD      . menthol-cetylpyridinium (CEPACOL) lozenge 3 mg  1 lozenge Oral PRN Hooten, Illene LabradorJames P, MD       Or  . phenol (CHLORASEPTIC) mouth  spray 1 spray  1 spray Mouth/Throat PRN Hooten, Illene LabradorJames P, MD      . metoCLOPramide (REGLAN) tablet 10 mg  10 mg Oral TID AC & HS Hooten, Illene LabradorJames P, MD      . morphine 2 MG/ML injection 2 mg  2 mg Intravenous Q2H PRN Hooten, Illene LabradorJames P, MD      . multivitamin with minerals tablet 1 tablet  1 tablet Oral Daily Hooten, Illene LabradorJames P, MD      . ondansetron (ZOFRAN) tablet 4 mg  4 mg Oral Q6H PRN Hooten, Illene LabradorJames P, MD       Or  . ondansetron (ZOFRAN) injection 4 mg  4 mg Intravenous Q6H PRN Hooten, Illene LabradorJames P, MD      . oxyCODONE (Oxy IR/ROXICODONE) immediate release tablet 10 mg  10 mg Oral Q3H PRN Hooten, Illene LabradorJames P, MD      . oxyCODONE (Oxy IR/ROXICODONE) immediate release tablet 5 mg  5 mg Oral Q3H PRN Hooten, Illene LabradorJames P, MD   5 mg at 11/17/17 1448  . pantoprazole (PROTONIX) EC tablet 40 mg  40 mg Oral BID Hooten, Illene LabradorJames P, MD      . Potassium TABS 198 mg  2 tablet Oral Daily Hooten, Illene LabradorJames P, MD      . senna-docusate (Senokot-S) tablet 1 tablet  1 tablet Oral BID Hooten, Illene LabradorJames P, MD      . sodium phosphate (FLEET) 7-19 GM/118ML enema 1 enema  1 enema Rectal Once PRN Hooten, Illene LabradorJames P, MD      . traMADol Janean Sark(ULTRAM) tablet 50-100 mg  50-100 mg Oral Q4H PRN Donato HeinzHooten, James P, MD   100 mg at 11/17/17 1448  . vitamin C (ASCORBIC ACID) tablet 1,000 mg  1,000 mg Oral Daily Hooten, Illene LabradorJames P, MD         Discharge Medications: Please see discharge summary for a list of discharge medications.  Relevant Imaging Results:  Relevant Lab Results:   Additional Information (SSN: [id number])  Roxanna Mcever, Darleen CrockerBailey M, LCSW

## 2017-11-17 NOTE — Anesthesia Procedure Notes (Signed)
Performed by: Starlett Pehrson, CRNA Pre-anesthesia Checklist: Patient identified, Emergency Drugs available, Suction available, Patient being monitored and Timeout performed Oxygen Delivery Method: Simple face mask       

## 2017-11-17 NOTE — H&P (Signed)
The patient has been re-examined, and the chart reviewed, and there have been no interval changes to the documented history and physical.    The risks, benefits, and alternatives have been discussed at length. The patient expressed understanding of the risks benefits and agreed with plans for surgical intervention.  James P. Hooten, Jr. M.D.    

## 2017-11-17 NOTE — Anesthesia Procedure Notes (Signed)
Spinal  Patient location during procedure: OR Staffing Performed: anesthesiologist  Preanesthetic Checklist Completed: patient identified, site marked, surgical consent, pre-op evaluation, timeout performed, IV checked, risks and benefits discussed and monitors and equipment checked Spinal Block Patient position: sitting Prep: Betadine Patient monitoring: heart rate, continuous pulse ox, blood pressure and cardiac monitor Approach: midline Location: L5-S1 Injection technique: single-shot Needle Needle type: Whitacre and Introducer  Needle gauge: 24 G Needle length: 10 cm Assessment Sensory level: T4 Additional Notes Negative paresthesia. Negative blood return. Positive free-flowing CSF. Expiration date of kit checked and confirmed. Patient tolerated procedure well, without complications.

## 2017-11-17 NOTE — Anesthesia Preprocedure Evaluation (Signed)
Anesthesia Evaluation  Patient identified by MRN, date of birth, ID band Patient awake    Reviewed: Allergy & Precautions, H&P , NPO status , Patient's Chart, lab work & pertinent test results, reviewed documented beta blocker date and time   Airway Mallampati: III   Neck ROM: full    Dental  (+) Teeth Intact   Pulmonary neg pulmonary ROS,    Pulmonary exam normal        Cardiovascular Exercise Tolerance: Good hypertension, On Medications negative cardio ROS Normal cardiovascular exam Rhythm:regular Rate:Normal     Neuro/Psych negative neurological ROS  negative psych ROS   GI/Hepatic negative GI ROS, Neg liver ROS,   Endo/Other  negative endocrine ROS  Renal/GU negative Renal ROS  negative genitourinary   Musculoskeletal   Abdominal   Peds  Hematology negative hematology ROS (+) anemia ,   Anesthesia Other Findings Past Medical History: No date: Anemia No date: Arthritis No date: Fatty liver No date: Hypertension No date: Lymph edema     Comment:  lower extremities Past Surgical History: No date: CHOLECYSTECTOMY No date: JOINT REPLACEMENT; Left     Comment:  knee, hip No date: JOINT REPLACEMENT; Bilateral     Comment:  knee 09/21/2015: KNEE ARTHROPLASTY; Right     Comment:  Procedure: COMPUTER ASSISTED TOTAL KNEE ARTHROPLASTY;                Surgeon: Donato HeinzJames P Hooten, MD;  Location: [facility] ORS;                Service: Orthopedics;  Laterality: Right; No date: TONSILLECTOMY BMI    Body Mass Index:  43.29 kg/m     Reproductive/Obstetrics negative OB ROS                             Anesthesia Physical Anesthesia Plan  ASA: III  Anesthesia Plan: Spinal and General   Post-op Pain Management:    Induction:   PONV Risk Score and Plan: 4 or greater  Airway Management Planned:   Additional Equipment:   Intra-op Plan:   Post-operative Plan:   Informed Consent: I have  reviewed the patients History and Physical, chart, labs and discussed the procedure including the risks, benefits and alternatives for the proposed anesthesia with the patient or authorized representative who has indicated his/her understanding and acceptance.   Dental Advisory Given  Plan Discussed with: CRNA  Anesthesia Plan Comments:         Anesthesia Quick Evaluation

## 2017-11-18 LAB — BASIC METABOLIC PANEL
Anion gap: 7 (ref 5–15)
BUN: 9 mg/dL (ref 6–20)
CO2: 26 mmol/L (ref 22–32)
CREATININE: 0.48 mg/dL (ref 0.44–1.00)
Calcium: 8.6 mg/dL — ABNORMAL LOW (ref 8.9–10.3)
Chloride: 102 mmol/L (ref 101–111)
GFR calc Af Amer: >60 mL/min (ref >60)
GLUCOSE: 117 mg/dL — AB (ref 65–99)
POTASSIUM: 3.7 mmol/L (ref 3.5–5.1)
SODIUM: 135 mmol/L (ref 135–145)

## 2017-11-18 LAB — HEMOGLOBIN: HEMOGLOBIN: 13.5 g/dL (ref 12.0–16.0)

## 2017-11-18 MED ORDER — POTASSIUM CHLORIDE CRYS ER 10 MEQ PO TBCR
10.0000 meq | EXTENDED_RELEASE_TABLET | Freq: Every day | ORAL | Status: DC
  Administered 2017-11-18 – 2017-11-19 (×2): 10 meq via ORAL
  Filled 2017-11-18 (×2): qty 1

## 2017-11-18 NOTE — Progress Notes (Addendum)
Physical Therapy Treatment Patient Details Name: Tamara Dodson MRN: [id number] DOB: 04/18/1961 Today's Date: 11/18/2017    History of Present Illness admitted for acute hospitalization status post R THA (11/17/17), WBAT, post THPs    PT Comments    Upon entering room pt is in bed and is awake. She reported 4/10 pain today. Reviewed posterior THA precautions which she required minimal verbal cues for. She required min assist with RLE movement to perform supine>sit, and verbal cues for hand placement with sit to stand. She ambulated 220 ft using RW reporting constant 4/10 pain, she performed exercises once she returned to the recliner. She was left with her call bell and nurse notified of progress.    Follow Up Recommendations        Equipment Recommendations       Recommendations for Other Services       Precautions / Restrictions Restrictions RLE Weight Bearing: Weight bearing as tolerated    Mobility  Bed Mobility Overal bed mobility: Needs Assistance Bed Mobility: Supine to Sit     Supine to sit: Min assist     General bed mobility comments: Min assist with abducting RLE with verbal cues for post THA precuations  Transfers Overall transfer level: Needs assistance Equipment used: Rolling walker (2 wheeled) Transfers: Sit to/from Stand Sit to Stand: Min guard         General transfer comment: pushed predominatley from LLE with sit to stand transition  Ambulation/Gait Ambulation/Gait assistance: Min guard Ambulation Distance (Feet): 220 Feet Assistive device: Rolling walker (2 wheeled)       General Gait Details: step through gait pattern with limited stride bil worse on LLE due to limited stance time on RLE   Stairs            Wheelchair Mobility    Modified Rankin (Stroke Patients Only)       Balance                                            Cognition Arousal/Alertness: Awake/alert Behavior During Therapy: WFL for tasks  assessed/performed Overall Cognitive Status: Within Functional Limits for tasks assessed                                        Exercises Other Exercises Other Exercises: supine quad/ glute set 2 x 10, standing pre-gait weight shifting 1 x 10 with RLE forward, 1 x 10 with LLE.  Other Exercises: reviewd post THA precautions which she required min cues  Other Exercises: knee extension 2 x 10     General Comments        Pertinent Vitals/Pain Pain Assessment: 0-10 Pain Score: 4  Pain Location: R hip Pain Descriptors / Indicators: Aching;Sore Pain Intervention(s): Monitored during session    Home Living                      Prior Function            PT Goals (current goals can now be found in the care plan section) Acute Rehab PT Goals Patient Stated Goal: to return home    Frequency    BID      PT Plan      Co-evaluation  AM-PAC PT "6 Clicks" Daily Activity  Outcome Measure  Difficulty turning over in bed (including adjusting bedclothes, sheets and blankets)?: Unable Difficulty moving from lying on back to sitting on the side of the bed? : Unable Difficulty sitting down on and standing up from a chair with arms (e.g., wheelchair, bedside commode, etc,.)?: Unable Help needed moving to and from a bed to chair (including a wheelchair)?: A Little Help needed walking in hospital room?: A Little Help needed climbing 3-5 steps with a railing? : A Little 6 Click Score: 12    End of Session Equipment Utilized During Treatment: Gait belt Activity Tolerance: Patient tolerated treatment well;Patient limited by pain Patient left: in chair;with chair alarm set;with call bell/phone within reach Nurse Communication: Mobility status PT Visit Diagnosis: Muscle weakness (generalized) (M62.81);Difficulty in walking, not elsewhere classified (R26.2);Pain Pain - Right/Left: Right Pain - part of body: Hip     Time: [id number] PT Time  Calculation (min) (ACUTE ONLY): 27 min  Charges:  $Gait Training: 8-22 mins $Therapeutic Exercise: 8-22 mins                    G Codes:       Tamara Dodson PT, DPT, LAT, ATC  11/18/17  8:55 AM        Tamara Dodson, Tamara Dodson 11/18/2017, 8:52 AM

## 2017-11-18 NOTE — Clinical Social Work Note (Signed)
CSW received consult for possible placement; however, PT is not recommending such. CSW is signing off. Please consult should new needs arise.  Argentina PonderKaren Martha Memorie Yokoyama, MSW, Theresia MajorsLCSWA [phone]

## 2017-11-18 NOTE — Progress Notes (Signed)
Pt alert and oriented. Pain control with oral pain medication. Surgical dressing remaining dry and intact. Pt was up to chair earlier in the shift. Iv infusing without difficulty. Foley patent and draining urine. Pt able to sleep in between care.

## 2017-11-18 NOTE — Progress Notes (Signed)
Physical Therapy Treatment Patient Details Name: Tamara Dodson MRN: [id number] DOB: 01/02/1961 Today's Date: 11/18/2017    History of Present Illness admitted for acute hospitalization status post R THA (11/17/17), WBAT, post THPs    PT Comments    Starting session pt was still in the recliner from previous session. Continued to review posterior hip precautions which she recited with no assistance. Pain initally was 2/10 before treatment started. She was able to ambulate ~16280ft and navigate gym steps 2 x requiring minimal verbal cues for form with ascending/ descending steps. Pt returned to bed following treatment and performed exercises well fatiguing quickly  while sitting on EOB. She required min A +1 to sit >supine due to RLE weakness. She is making great progress toward her goals.    Follow Up Recommendations        Equipment Recommendations       Recommendations for Other Services       Precautions / Restrictions Precautions Precautions: Posterior Hip;Fall Restrictions Weight Bearing Restrictions: Yes RLE Weight Bearing: Weight bearing as tolerated    Mobility  Bed Mobility Overal bed mobility: Needs Assistance Bed Mobility: Sit to Supine     Supine to sit: Min assist Sit to supine: Min assist   General bed mobility comments: reviewed how to perform supine to sit in regard to posterior hip precautions. She required min assist with RLE lifting to get back into bed.   Transfers Overall transfer level: Needs assistance Equipment used: Rolling walker (2 wheeled) Transfers: Sit to/from Stand Sit to Stand: Supervision(verbal cues for RLE placement)            Ambulation/Gait Ambulation/Gait assistance: Supervision Ambulation Distance (Feet): 180 Feet Assistive device: Rolling walker (2 wheeled)       General Gait Details: step through gait pattern with limited stride bil worse on LLE due to limited stance time on RLE   Stairs Stairs: Yes   Stair  Management: Two rails;Step to pattern;Forwards Number of Stairs: 8(x2) General stair comments: reviewed proper form with ascending/ descending stairs up with the LLE, and down leading with the RLE  Wheelchair Mobility    Modified Rankin (Stroke Patients Only)       Balance Overall balance assessment: Needs assistance Sitting-balance support: No upper extremity supported;Feet supported Sitting balance-Leahy Scale: Normal     Standing balance support: Bilateral upper extremity supported Standing balance-Leahy Scale: Fair                              Cognition Arousal/Alertness: Awake/alert Behavior During Therapy: WFL for tasks assessed/performed Overall Cognitive Status: Within Functional Limits for tasks assessed                                 General Comments: Able to state 3/3 hip precautions      Exercises Other Exercises Other Exercises: standing quad/ glute set 1 x 10 with 5 sec hold Other Exercises: LAQ 2 x 10 RLE only Other Exercises: isometric adductor pillow squeeze 1 x 10 holding 5 sec eac Other Exercises: manual resistance bil hip abduction 2 x 10    General Comments        Pertinent Vitals/Pain Pain Assessment: 0-10 Pain Score: 2  Pain Location: R hip Pain Descriptors / Indicators: Aching;Sore Pain Intervention(s): Limited activity within patient's tolerance;Monitored during session    Home Living Family/patient expects to be discharged to::  Private residence Living Arrangements: Children Available Help at Discharge: Family Type of Home: House Home Access: Stairs to enter Entrance Stairs-Rails: None Home Layout: Two level;Bed/bath upstairs Home Equipment: Environmental consultantWalker - 2 wheels;Cane - single point      Prior Function Level of Independence: Independent      Comments: Indep with ADLs, household and community mobilization; denies fall history.   PT Goals (current goals can now be found in the care plan section) Acute Rehab  PT Goals Patient Stated Goal: to return home, be as independent as possible Progress towards PT goals: Progressing toward goals    Frequency           PT Plan      Co-evaluation              AM-PAC PT "6 Clicks" Daily Activity  Outcome Measure  Difficulty turning over in bed (including adjusting bedclothes, sheets and blankets)?: Unable Difficulty moving from lying on back to sitting on the side of the bed? : Unable Difficulty sitting down on and standing up from a chair with arms (e.g., wheelchair, bedside commode, etc,.)?: Unable Help needed moving to and from a bed to chair (including a wheelchair)?: A Little Help needed walking in hospital room?: A Little Help needed climbing 3-5 steps with a railing? : A Little 6 Click Score: 12    End of Session Equipment Utilized During Treatment: Gait belt Activity Tolerance: Patient tolerated treatment well;Patient limited by pain Patient left: in bed;with family/visitor present;with call bell/phone within reach Nurse Communication: Mobility status PT Visit Diagnosis: Muscle weakness (generalized) (M62.81);Difficulty in walking, not elsewhere classified (R26.2);Pain Pain - Right/Left: Right Pain - part of body: Hip     Time: [id number] PT Time Calculation (min) (ACUTE ONLY): 39 min  Charges:  $Gait Training: 8-22 mins $Therapeutic Exercise: 8-22 mins $Therapeutic Activity: 8-22 mins                    G Codes:       Kristoffer Leamon PT, DPT, LAT, ATC  11/18/17  1:43 PM        Leamon, Kristoffer 11/18/2017, 1:40 PM

## 2017-11-18 NOTE — Progress Notes (Signed)
  Subjective: 1 Day Post-Op Procedure(s) (LRB): TOTAL HIP ARTHROPLASTY (Right) Patient reports pain as moderate.   Patient seen in rounds with Dr. Ernest PineHooten. Patient is well, and has had no acute complaints or problems Plan is to go Home after hospital stay. Negative for chest pain and shortness of breath Fever: no Gastrointestinal: Negative for nausea and vomiting  Objective: Vital signs in last 24 hours: Temp:  [97.7 F (36.5 C)-100 F (37.8 C)] 98.7 F (37.1 C) (12/01 0447) Pulse Rate:  [64-90] 77 (12/01 0447) Resp:  [11-20] 18 (12/01 0447) BP: (114-154)/(46-77) 132/62 (12/01 0447) SpO2:  [92 %-100 %] 96 % (12/01 0447) Weight:  [139.3 kg (307 lb 3.2 oz)] 139.3 kg (307 lb 3.2 oz) (11/30 1330)  Intake/Output from previous day:  Intake/Output Summary (Last 24 hours) at 11/18/2017 0707 Last data filed at 11/18/2017 0153 Gross per 24 hour  Intake 2850 ml  Output 3250 ml  Net -400 ml    Intake/Output this shift: No intake/output data recorded.  Labs: No results for input(s): HGB in the last 72 hours. No results for input(s): WBC, RBC, HCT, PLT in the last 72 hours. No results for input(s): NA, K, CL, CO2, BUN, CREATININE, GLUCOSE, CALCIUM in the last 72 hours. No results for input(s): LABPT, INR in the last 72 hours.   EXAM General - Patient is Alert and Oriented Extremity - Neurologically intact Neurovascular intact Dorsiflexion/Plantar flexion intact Compartment soft Dressing/Incision - clean, dry, no drainage with the Hemovac intact. Motor Function - intact, moving foot and toes well on exam.   Past Medical History:  Diagnosis Date  . Anemia   . Arthritis   . Fatty liver   . Hypertension   . Lymph edema    lower extremities    Assessment/Plan: 1 Day Post-Op Procedure(s) (LRB): TOTAL HIP ARTHROPLASTY (Right) Active Problems:   Status post total replacement of hip  Estimated body mass index is 44.08 kg/m as calculated from the following:   Height as of  this encounter: 5\' 10"  (1.778 m).   Weight as of this encounter: 139.3 kg (307 lb 3.2 oz). Advance diet Up with therapy D/C IV fluids Plan for discharge tomorrow  DVT Prophylaxis - Lovenox, Foot Pumps and TED hose Weight-Bearing as tolerated to right leg  Dedra Skeensodd Dyonna Jaspers, PA-C Orthopaedic Surgery 11/18/2017, 7:07 AM

## 2017-11-18 NOTE — Evaluation (Signed)
Occupational Therapy Evaluation Patient Details Name: Noland FordyceRenee L Litt MRN: [id number] DOB: 09/21/1961 Today's Date: 11/18/2017    History of Present Illness admitted for acute hospitalization status post R THA (11/17/17), WBAT, post THPs   Clinical Impression   Patient seen for OT evaluation this date.  Patient lives with family, was independent prior to admission, worked full time.  Patient status post right total hip replacement and has posterior hip precautions and is WBAT.  She presents with muscle weakness, pain, decreased transfers, functional mobility and decreased ability to perform self care tasks.  She is familiar with precautions from past surgery and is also familiar with adaptive equipment.  She was reinstructed on a sock aid to help with donning and doffing socks, minimal cues for use.  Patient would benefit from skilled OT during acute care stay to focus on self care tasks and increasing safety and independence in daily tasks.  She will not likely require any follow up OT upon discharge home.     Follow Up Recommendations  No OT follow up    Equipment Recommendations       Recommendations for Other Services       Precautions / Restrictions Precautions Precautions: Posterior Hip;Fall Restrictions Weight Bearing Restrictions: Yes RLE Weight Bearing: Weight bearing as tolerated      Mobility Bed Mobility Overal bed mobility: Needs Assistance Bed Mobility: Supine to Sit     Supine to sit: Min assist     General bed mobility comments: per PT  Transfers Overall transfer level: Needs assistance Equipment used: Rolling walker (2 wheeled) Transfers: Sit to/from Stand Sit to Stand: Supervision              Balance Overall balance assessment: Needs assistance Sitting-balance support: No upper extremity supported;Feet supported Sitting balance-Leahy Scale: Normal     Standing balance support: Bilateral upper extremity supported Standing balance-Leahy Scale:  Fair                             ADL either performed or assessed with clinical judgement   ADL Overall ADL's : Needs assistance/impaired Eating/Feeding: Modified independent   Grooming: Modified independent   Upper Body Bathing: Modified independent   Lower Body Bathing: Maximal assistance Lower Body Bathing Details (indicate cue type and reason): secondary to hip precautions, will need long handled sponge or assist from family with lower body bathing Upper Body Dressing : Modified independent   Lower Body Dressing: With adaptive equipment;Cueing for sequencing;Adhering to hip precautions Lower Body Dressing Details (indicate cue type and reason): instructed on sock aid and reacher for LB dressing Toilet Transfer: Supervision/safety   Toileting- Clothing Manipulation and Hygiene: Supervision/safety       Functional mobility during ADLs: Supervision/safety       Vision         Perception     Praxis      Pertinent Vitals/Pain Pain Assessment: 0-10 Pain Score: 5  Pain Location: R hip Pain Descriptors / Indicators: Aching;Sore Pain Intervention(s): Limited activity within patient's tolerance;Monitored during session     Hand Dominance Right   Extremity/Trunk Assessment Upper Extremity Assessment Upper Extremity Assessment: Overall WFL for tasks assessed   Lower Extremity Assessment Lower Extremity Assessment: Generalized weakness   Cervical / Trunk Assessment Cervical / Trunk Assessment: Normal   Communication Communication Communication: No difficulties   Cognition Arousal/Alertness: Awake/alert Behavior During Therapy: WFL for tasks assessed/performed Overall Cognitive Status: Within Functional Limits for tasks assessed  General Comments: Able to state 3/3 hip precautions   General Comments       Exercises     Shoulder Instructions      Home Living Family/patient expects to be discharged  to:: Private residence Living Arrangements: Children Available Help at Discharge: Family Type of Home: House Home Access: Stairs to enter Secretary/administratorntrance Stairs-Number of Steps: 1 Entrance Stairs-Rails: None Home Layout: Two level;Bed/bath upstairs   Alternate Level Stairs-Rails: Left Bathroom Shower/Tub: Walk-in shower;Door   Bathroom Toilet: Handicapped height Bathroom Accessibility: Yes   Home Equipment: Environmental consultantWalker - 2 wheels;Cane - single point          Prior Functioning/Environment Level of Independence: Independent        Comments: Indep with ADLs, household and community mobilization; denies fall history.        OT Problem List: Decreased strength;Impaired balance (sitting and/or standing);Pain;Decreased range of motion;Decreased knowledge of use of DME or AE      OT Treatment/Interventions: Self-care/ADL training;DME and/or AE instruction;Therapeutic activities;Balance training;Therapeutic exercise;Patient/family education    OT Goals(Current goals can be found in the care plan section) Acute Rehab OT Goals Patient Stated Goal: to return home, be as independent as possible OT Goal Formulation: With patient Time For Goal Achievement: 11/25/17 Potential to Achieve Goals: Good ADL Goals Pt Will Perform Lower Body Dressing: (P) with adaptive equipment  OT Frequency: Min 1X/week   Barriers to D/C:            Co-evaluation              AM-PAC PT "6 Clicks" Daily Activity     Outcome Measure                 End of Session Equipment Utilized During Treatment: Gait belt;Rolling walker  Activity Tolerance: Patient tolerated treatment well Patient left: in chair;with chair alarm set  OT Visit Diagnosis: Pain;Muscle weakness (generalized) (M62.81) Pain - Right/Left: Right Pain - part of body: Hip                Time: [id number] OT Time Calculation (min): 35 min Charges:  OT General Charges $OT Visit: 1 Visit OT Evaluation $OT Eval Low Complexity: 1  Low OT Treatments $Self Care/Home Management : 8-22 mins G-Codes:     Aleea Hendry T Seylah Wernert, OTR/L, CLT  Eilish Mcdaniel 11/18/2017, 12:01 PM

## 2017-11-19 LAB — HEMOGLOBIN: Hemoglobin: 13.1 g/dL (ref 12.0–16.0)

## 2017-11-19 MED ORDER — OXYCODONE HCL 5 MG PO TABS: 5.0000 mg | ORAL_TABLET | ORAL | 0 refills | Status: DC | PRN

## 2017-11-19 MED ORDER — CELECOXIB 200 MG PO CAPS: 200.0000 mg | ORAL_CAPSULE | Freq: Two times a day (BID) | ORAL | 1 refills | Status: DC

## 2017-11-19 MED ORDER — ENOXAPARIN SODIUM 40 MG/0.4ML ~~LOC~~ SOLN: 40.0000 mg | SUBCUTANEOUS | 0 refills | Status: DC

## 2017-11-19 MED ORDER — TRAMADOL HCL 50 MG PO TABS: 50.0000 mg | ORAL_TABLET | ORAL | 1 refills | Status: DC | PRN

## 2017-11-19 NOTE — Progress Notes (Signed)
  Subjective: 2 Days Post-Op Procedure(s) (LRB): TOTAL HIP ARTHROPLASTY (Right) Patient reports pain as 2 out of 10.   Patient seen in rounds with Dr. Ernest PineHooten. Patient is well, and has had no acute complaints or problems Plan is to go Home after hospital stay. Planning today. Negative for chest pain and shortness of breath Fever: no Gastrointestinal: Negative for nausea and vomiting  Objective: Vital signs in last 24 hours: Temp:  [98.9 F (37.2 C)-99.3 F (37.4 C)] 99.3 F (37.4 C) (12/01 2109) Pulse Rate:  [77-93] 93 (12/01 2109) Resp:  [18] 18 (12/01 2109) BP: (130-146)/(52-56) 138/52 (12/01 2109) SpO2:  [95 %-97 %] 95 % (12/01 2109)  Intake/Output from previous day:  Intake/Output Summary (Last 24 hours) at 11/19/2017 0647 Last data filed at 11/19/2017 0200 Gross per 24 hour  Intake 1440 ml  Output 100 ml  Net 1340 ml    Intake/Output this shift: Total I/O In: 480 [P.O.:480] Out: -   Labs: Recent Labs    11/18/17 0722 11/19/17 0334  HGB 13.5 13.1   No results for input(s): WBC, RBC, HCT, PLT in the last 72 hours. Recent Labs    11/18/17 0722  NA 135  K 3.7  CL 102  CO2 26  BUN 9  CREATININE 0.48  GLUCOSE 117*  CALCIUM 8.6*   No results for input(s): LABPT, INR in the last 72 hours.   EXAM General - Patient is Alert and Oriented Extremity - Neurologically intact Neurovascular intact Dorsiflexion/Plantar flexion intact Compartment soft Dressing/Incision - clean, dry, no drainage with the Hemovac removed with no complication. Motor Function - intact, moving foot and toes well on exam. Ambulated 180 feet.  Past Medical History:  Diagnosis Date  . Anemia   . Arthritis   . Fatty liver   . Hypertension   . Lymph edema    lower extremities    Assessment/Plan: 2 Days Post-Op Procedure(s) (LRB): TOTAL HIP ARTHROPLASTY (Right) Active Problems:   Status post total replacement of hip  Estimated body mass index is 44.08 kg/m as calculated from  the following:   Height as of this encounter: 5\' 10"  (1.778 m).   Weight as of this encounter: 139.3 kg (307 lb 3.2 oz). Continue physical therapy. Discharge home today.  Vitals stable. Patient has had a bowel movement.  DVT Prophylaxis - Lovenox, Foot Pumps and TED hose Weight-Bearing as tolerated to right leg  Dedra Skeensodd Derk Doubek, PA-C Orthopaedic Surgery 11/19/2017, 6:47 AM

## 2017-11-19 NOTE — Progress Notes (Signed)
DISCHARGE NOTE:  Pt given discharge instructions and prescriptions. Pt verbalized understanding. Pt verbalized understanding. Pt wheeled to car by staff.

## 2017-11-19 NOTE — Progress Notes (Signed)
Physical Therapy Treatment Patient Details Name: Tamara FordyceRenee L Fickett MRN: [id number] DOB: 12/31/1960 Today's Date: 11/19/2017    History of Present Illness admitted for acute hospitalization status post R THA (11/17/17), WBAT, post THPs    PT Comments    Patient performs transfer training with SBA, bed mobility supine to sit with mod assist for RLE lifting up to the bed, ambulation with SBA 160 feet with RW with decreased gait speed. Patient performs AAROM and AROM exercises following gait training with no reports of pain. No new questions or concerns. Patient is independent with precautions.   Follow Up Recommendations  Outpatient PT     Equipment Recommendations  Rolling walker with 5" wheels    Recommendations for Other Services       Precautions / Restrictions Precautions Precautions: Posterior Hip;Fall Restrictions Weight Bearing Restrictions: Yes RLE Weight Bearing: Weight bearing as tolerated    Mobility  Bed Mobility Overal bed mobility: Needs Assistance Bed Mobility: Sit to Supine     Supine to sit: Mod assist     General bed mobility comments: reviewed how to perform supine to sit in regard to posterior hip precautions. She required min assist with RLE lifting to get back into bed.   Transfers Overall transfer level: Needs assistance Equipment used: Rolling walker (2 wheeled) Transfers: Sit to/from Stand Sit to Stand: Supervision         General transfer comment: pushed predominatley from LLE with sit to stand transition  Ambulation/Gait Ambulation/Gait assistance: Supervision Ambulation Distance (Feet): 160 Feet Assistive device: Rolling walker (2 wheeled) Gait Pattern/deviations: Step-through pattern     General Gait Details: step through gait pattern with limited stride bil worse on LLE due to limited stance time on RLE   Stairs            Wheelchair Mobility    Modified Rankin (Stroke Patients Only)       Balance Overall balance  assessment: Needs assistance Sitting-balance support: No upper extremity supported;Feet supported Sitting balance-Leahy Scale: Normal     Standing balance support: Bilateral upper extremity supported Standing balance-Leahy Scale: Fair                              Cognition Arousal/Alertness: Awake/alert Behavior During Therapy: WFL for tasks assessed/performed Overall Cognitive Status: Within Functional Limits for tasks assessed                                 General Comments: Able to state 3/3 hip precautions      Exercises Other Exercises Other Exercises: standing quad/ glute set 1 x 10 with 5 sec hold Other Exercises: LAQ 2 x 10 RLE only Other Exercises: isometric adductor pillow squeeze 1 x 10 holding 5 sec eac Other Exercises: (heel slide , hip abd/add assisted, SAQ)    General Comments        Pertinent Vitals/Pain Pain Assessment: Faces Pain Score: 5  Faces Pain Scale: Hurts little more Pain Location: R hip Pain Descriptors / Indicators: Aching;Sore Pain Intervention(s): Limited activity within patient's tolerance    Home Living                      Prior Function            PT Goals (current goals can now be found in the care plan section) Acute Rehab PT Goals Patient Stated  Goal: to return home, be as independent as possible PT Goal Formulation: With patient Time For Goal Achievement: 12/01/17 Potential to Achieve Goals: Good Progress towards PT goals: Progressing toward goals    Frequency    BID      PT Plan Current plan remains appropriate    Co-evaluation              AM-PAC PT "6 Clicks" Daily Activity  Outcome Measure  Difficulty turning over in bed (including adjusting bedclothes, sheets and blankets)?: Unable Difficulty moving from lying on back to sitting on the side of the bed? : Unable Difficulty sitting down on and standing up from a chair with arms (e.g., wheelchair, bedside commode,  etc,.)?: Unable Help needed moving to and from a bed to chair (including a wheelchair)?: A Little Help needed walking in hospital room?: A Little Help needed climbing 3-5 steps with a railing? : A Little 6 Click Score: 12    End of Session Equipment Utilized During Treatment: Gait belt Activity Tolerance: Patient tolerated treatment well;Patient limited by pain Patient left: in bed;with family/visitor present;with call bell/phone within reach   PT Visit Diagnosis: Muscle weakness (generalized) (M62.81);Difficulty in walking, not elsewhere classified (R26.2);Pain Pain - Right/Left: Right Pain - part of body: Hip     Time: 0900-0930 PT Time Calculation (min) (ACUTE ONLY): 30 min  Charges:  $Gait Training: 8-22 mins $Therapeutic Exercise: 8-22 mins                    G Codes:  Functional Assessment Tool Used: AM-PAC 6 Clicks Basic Mobility Functional Limitation: Mobility: Walking and moving around Mobility: Walking and Moving Around Current Status ([id number](G8978): At least 20 percent but less than 40 percent impaired, limited or restricted Mobility: Walking and Moving Around Goal Status [id number]([id number]): At least 1 percent but less than 20 percent impaired, limited or restricted      Ezekiel InaMansfield, Sheyenne Konz S, PT DPT 11/19/2017, 11:20 AM

## 2017-11-19 NOTE — Care Management Note (Signed)
Case Management Note  Patient Details  Name: Tamara Dodson MRN: [id number] Date of Birth: 03/16/1961  Subjective/Objective:    A referral for HH=PT was called to [facility] per patient choice. A script for generic Lovenox 40mg  SQ daily x 14 days [facility] [address] Tamara Dodson already has all DME equipment at home from previous surgeries.                 Action/Plan:   Expected Discharge Date:  11/19/17               Expected Discharge Plan:  Home w Home Health Services  In-House Referral:     Discharge planning Services  CM Consult  Post Acute Care Choice:  Home Health Choice offered to:  Patient  DME Arranged:  3-N-1(has all DME at home from previous surgeries) DME Agency:     HH Arranged:  PT HH Agency:  [facility]  Status of Service:  Completed, signed off  If discussed at [employer] Meetings, dates discussed:    Additional Comments:  Skylynn Burkley A, RN 11/19/2017, 8:52 AM

## 2017-11-19 NOTE — Discharge Summary (Signed)
Physician Discharge Summary  Subjective: 2 Days Post-Op Procedure(s) (LRB): TOTAL HIP ARTHROPLASTY (Right) Patient reports pain as 2 on 0-10 scale.   Patient seen in rounds with Dr. Ernest PineHooten. Patient is well, and has had no acute complaints or problems Patient is ready to go home with home health physical therapy  Physician Discharge Summary  Patient ID: Tamara Dodson MRN: [id number] DOB/AGE: 56/04/1961 56 y.o.  Admit date: 11/17/2017 Discharge date: 11/19/2017  Admission Diagnoses:  Discharge Diagnoses:  Active Problems:   Status post total replacement of hip   Discharged Condition: good  Hospital Course: The patient is postop day 2 from a right total hip replacement. She has done very well since surgery. She is ambulated 180 feet yesterday. Her vitals are stable. She has normal labs. The patient is moving around comfortably in the bed. The patient had a bowel movement.  Treatments: surgery:  Right total hip arthroplasty  SURGEON:  Jena GaussJames P Hooten, Jr. M.D.  ASSISTANT:  Van ClinesJon Wolfe, PA (present and scrubbed throughout the case, critical for assistance with exposure, retraction, instrumentation, and closure)  ANESTHESIA: spinal  ESTIMATED BLOOD LOSS: 400 mL  FLUIDS REPLACED: 1100 mL of crystalloid  DRAINS: 2 medium drains to a Hemovac reservoir  IMPLANTS UTILIZED: DePuy 16.5 mm small stature AML femoral stem, 52 mm OD Pinnacle 100 acetabular component, +4 mm neutral Pinnacle Marathon polyethylene insert, and a 36 mm M-SPEC +1.5 mm hip ball    Discharge Exam: Blood pressure (!) 138/52, pulse 93, temperature 99.3 F (37.4 C), temperature source Oral, resp. rate 18, height 5\' 10"  (1.778 m), weight (!) 139.3 kg (307 lb 3.2 oz), SpO2 95 %.   Disposition: 01-Home or Self Care   Allergies as of 11/19/2017   No Known Allergies     Medication List    STOP taking these medications   ALEVE PM 220-25 MG Tabs Generic drug:  Naproxen Sod-Diphenhydramine   naproxen  sodium 220 MG tablet Commonly known as:  ALEVE     TAKE these medications   acetaminophen 325 MG tablet Commonly known as:  TYLENOL Take 1,300 mg every 6 (six) hours as needed by mouth for mild pain or moderate pain.   amLODipine 5 MG tablet Commonly known as:  NORVASC Take 5 mg by mouth every morning.   aspirin EC 81 MG tablet Take 81 mg daily by mouth.   celecoxib 200 MG capsule Commonly known as:  CELEBREX Take 1 capsule (200 mg total) by mouth every 12 (twelve) hours.   dextromethorphan-guaiFENesin 30-600 MG 12hr tablet Commonly known as:  MUCINEX DM Take 1 tablet by mouth 2 (two) times daily as needed for cough.   enoxaparin 40 MG/0.4ML injection Commonly known as:  LOVENOX Inject 0.4 mLs (40 mg total) into the skin daily.   ferrous sulfate 325 (65 FE) MG EC tablet Take 325 mg daily by mouth.   furosemide 20 MG tablet Commonly known as:  LASIX Take 40 mg every morning by mouth.   multivitamin with minerals tablet Take 1 tablet by mouth daily.   oxyCODONE 5 MG immediate release tablet Commonly known as:  Oxy IR/ROXICODONE Take 1 tablet (5 mg total) by mouth every 4 (four) hours as needed for moderate pain ((score 4 to 6)).   Potassium 99 MG Tabs Take 2 tablets daily by mouth.   traMADol 50 MG tablet Commonly known as:  ULTRAM Take 1-2 tablets (50-100 mg total) by mouth every 4 (four) hours as needed for moderate pain.   vitamin C  1000 MG tablet Take 1,000 mg by mouth daily.            Durable Medical Equipment  (From admission, onward)        Start     Ordered   11/17/17 1323  DME Walker rolling  Once    Question:  Patient needs a walker to treat with the following condition  Answer:  S/P total hip arthroplasty   11/17/17 1322   11/17/17 1323  DME Bedside commode  Once    Question:  Patient needs a bedside commode to treat with the following condition  Answer:  S/P total hip arthroplasty   11/17/17 1322     Follow-up Information    Hooten,  Illene LabradorJames P, MD Follow up on 12/28/2017.   Specialty:  Orthopedic Surgery Why:  at 9:45am Contact information: [address] [facility] [place] [address] [phone]           Signed: Lenard ForthMUNDY, Edrei Norgaard 11/19/2017, 6:52 AM   Objective: Vital signs in last 24 hours: Temp:  [98.9 F (37.2 C)-99.3 F (37.4 C)] 99.3 F (37.4 C) (12/01 2109) Pulse Rate:  [77-93] 93 (12/01 2109) Resp:  [18] 18 (12/01 2109) BP: (130-146)/(52-56) 138/52 (12/01 2109) SpO2:  [95 %-97 %] 95 % (12/01 2109)  Intake/Output from previous day:  Intake/Output Summary (Last 24 hours) at 11/19/2017 0652 Last data filed at 11/19/2017 0200 Gross per 24 hour  Intake 1440 ml  Output 100 ml  Net 1340 ml    Intake/Output this shift: Total I/O In: 480 [P.O.:480] Out: -   Labs: Recent Labs    11/18/17 0722 11/19/17 0334  HGB 13.5 13.1   No results for input(s): WBC, RBC, HCT, PLT in the last 72 hours. Recent Labs    11/18/17 0722  NA 135  K 3.7  CL 102  CO2 26  BUN 9  CREATININE 0.48  GLUCOSE 117*  CALCIUM 8.6*   No results for input(s): LABPT, INR in the last 72 hours.  EXAM: General - Patient is Alert and Oriented Extremity - Neurologically intact Sensation intact distally Dorsiflexion/Plantar flexion intact Compartment soft Incision - clean, dry, intact with the Hemovac removed. Motor Function -  plantarflexion and dorsiflexion are intact.  Assessment/Plan: 2 Days Post-Op Procedure(s) (LRB): TOTAL HIP ARTHROPLASTY (Right) Procedure(s) (LRB): TOTAL HIP ARTHROPLASTY (Right) Past Medical History:  Diagnosis Date  . Anemia   . Arthritis   . Fatty liver   . Hypertension   . Lymph edema    lower extremities   Active Problems:   Status post total replacement of hip  Estimated body mass index is 44.08 kg/m as calculated from the following:   Height as of this encounter: 5\' 10"  (1.778 m).   Weight as of this encounter: 139.3 kg (307 lb 3.2 oz). Up with  therapy Discharge home with home health Diet - Regular diet Follow up - in 6 weeks Activity - WBAT Disposition - Home Condition Upon Discharge - Good DVT Prophylaxis - Lovenox and TED hose  Dedra Skeensodd Sontee Desena, PA-C Orthopaedic Surgery 11/19/2017, 6:52 AM

## 2017-11-22 LAB — SURGICAL PATHOLOGY

## 2017-11-24 NOTE — Anesthesia Postprocedure Evaluation (Signed)
Anesthesia Post Note  Patient: Tamara Dodson  Procedure(s) Performed: TOTAL HIP ARTHROPLASTY (Right Hip)  Patient location during evaluation: PACU Anesthesia Type: Spinal Level of consciousness: oriented and awake and alert Pain management: pain level controlled Vital Signs Assessment: post-procedure vital signs reviewed and stable Respiratory status: spontaneous breathing, respiratory function stable and patient connected to nasal cannula oxygen Cardiovascular status: blood pressure returned to baseline and stable Postop Assessment: no headache, no backache and no apparent nausea or vomiting Anesthetic complications: no     Last Vitals:  Vitals:   11/18/17 2109 11/19/17 0735  BP: (!) 138/52 (!) 123/49  Pulse: 93 83  Resp: 18   Temp: 37.4 C 37 C  SpO2: 95% 94%    Last Pain:  Vitals:   11/19/17 0735  TempSrc: Oral  PainSc: 3                  Yevette EdwardsJames G Adams

## 2017-12-11 ENCOUNTER — Emergency Department
Admission: EM | Admit: 2017-12-11 | Discharge: 2017-12-11 | Disposition: A | Discharge status: Home / Self Care | Payer: BLUE CROSS/BLUE SHIELD | Attending: Emergency Medicine | Admitting: Emergency Medicine

## 2017-12-11 ENCOUNTER — Encounter: Payer: Self-pay | Admitting: Emergency Medicine

## 2017-12-11 ENCOUNTER — Other Ambulatory Visit: Payer: Self-pay

## 2017-12-11 DIAGNOSIS — Y628 Failure of sterile precautions during other surgical and medical care: Secondary | ICD-10-CM | POA: Insufficient documentation

## 2017-12-11 DIAGNOSIS — Z79899 Other long term (current) drug therapy: Secondary | ICD-10-CM | POA: Insufficient documentation

## 2017-12-11 DIAGNOSIS — T8140XA Infection following a procedure, unspecified, initial encounter: Secondary | ICD-10-CM | POA: Diagnosis present

## 2017-12-11 DIAGNOSIS — I10 Essential (primary) hypertension: Secondary | ICD-10-CM | POA: Insufficient documentation

## 2017-12-11 DIAGNOSIS — L03113 Cellulitis of right upper limb: Secondary | ICD-10-CM

## 2017-12-11 DIAGNOSIS — Z7901 Long term (current) use of anticoagulants: Secondary | ICD-10-CM | POA: Insufficient documentation

## 2017-12-11 DIAGNOSIS — L03115 Cellulitis of right lower limb: Secondary | ICD-10-CM | POA: Insufficient documentation

## 2017-12-11 DIAGNOSIS — Z7982 Long term (current) use of aspirin: Secondary | ICD-10-CM | POA: Diagnosis not present

## 2017-12-11 LAB — CBC WITH DIFFERENTIAL/PLATELET
Basophils Absolute: 0.1 10*3/uL (ref 0–0.1)
Basophils Relative: 1 %
EOS ABS: 0.1 10*3/uL (ref 0–0.7)
EOS PCT: 1 %
HCT: 46.4 % (ref 35.0–47.0)
Hemoglobin: 16 g/dL (ref 12.0–16.0)
LYMPHS ABS: 1.4 10*3/uL (ref 1.0–3.6)
Lymphocytes Relative: 15 %
MCH: 32.3 pg (ref 26.0–34.0)
MCHC: 34.5 g/dL (ref 32.0–36.0)
MCV: 93.7 fL (ref 80.0–100.0)
MONOS PCT: 7 %
Monocytes Absolute: 0.6 10*3/uL (ref 0.2–0.9)
Neutro Abs: 7.2 10*3/uL — ABNORMAL HIGH (ref 1.4–6.5)
Neutrophils Relative %: 76 %
PLATELETS: 295 10*3/uL (ref 150–440)
RBC: 4.95 MIL/uL (ref 3.80–5.20)
RDW: 13.7 % (ref 11.5–14.5)
WBC: 9.3 10*3/uL (ref 3.6–11.0)

## 2017-12-11 LAB — COMPREHENSIVE METABOLIC PANEL
ALK PHOS: 119 U/L (ref 38–126)
ALT: 19 U/L (ref 14–54)
AST: 30 U/L (ref 15–41)
Albumin: 4.5 g/dL (ref 3.5–5.0)
Anion gap: 9 (ref 5–15)
BUN: 11 mg/dL (ref 6–20)
CALCIUM: 9.5 mg/dL (ref 8.9–10.3)
CHLORIDE: 102 mmol/L (ref 101–111)
CO2: 25 mmol/L (ref 22–32)
CREATININE: 0.65 mg/dL (ref 0.44–1.00)
GFR calc Af Amer: >60 mL/min (ref >60)
Glucose, Bld: 122 mg/dL — ABNORMAL HIGH (ref 65–99)
Potassium: 4.3 mmol/L (ref 3.5–5.1)
Sodium: 136 mmol/L (ref 135–145)
Total Bilirubin: 0.8 mg/dL (ref 0.3–1.2)
Total Protein: 8.2 g/dL — ABNORMAL HIGH (ref 6.5–8.1)

## 2017-12-11 LAB — LACTIC ACID, PLASMA
LACTIC ACID, VENOUS: 2.1 mmol/L — AB (ref 0.5–1.9)
LACTIC ACID, VENOUS: 1.6 mmol/L (ref 0.5–1.9)
Lactic Acid, Venous: 1.8 mmol/L (ref 0.5–1.9)

## 2017-12-11 MED ORDER — SULFAMETHOXAZOLE-TRIMETHOPRIM 800-160 MG PO TABS: 1.0000 | ORAL_TABLET | Freq: Two times a day (BID) | ORAL | 0 refills | Status: AC

## 2017-12-11 MED ORDER — PIPERACILLIN-TAZOBACTAM 3.375 G IVPB 30 MIN
3.3750 g | Freq: Once | INTRAVENOUS | Status: AC
Start: 2017-12-11 — End: 2017-12-11
  Administered 2017-12-11: 3.375 g via INTRAVENOUS
  Filled 2017-12-11: qty 50

## 2017-12-11 MED ORDER — VANCOMYCIN HCL IN DEXTROSE 1-5 GM/200ML-% IV SOLN
1000.0000 mg | Freq: Once | INTRAVENOUS | Status: AC
  Administered 2017-12-11: 1000 mg via INTRAVENOUS
  Filled 2017-12-11: qty 200

## 2017-12-11 MED ORDER — CEPHALEXIN 500 MG PO CAPS: 500.0000 mg | ORAL_CAPSULE | Freq: Three times a day (TID) | ORAL | 0 refills | Status: AC

## 2017-12-11 MED ORDER — SODIUM CHLORIDE 0.9 % IV BOLUS (SEPSIS)
1000.0000 mL | Freq: Once | INTRAVENOUS | Status: AC
  Administered 2017-12-11: 1000 mL via INTRAVENOUS

## 2017-12-11 NOTE — ED Provider Notes (Signed)
[facility] Emergency Department Provider Note  ____________________________________________  Time seen: Approximately 2:45 PM  I have reviewed the triage vital signs and the nursing notes.   HISTORY  Chief Complaint post op infection   HPI Tamara FordyceRenee L Glaeser is a 56 y.o. female POD 3524from R hip replacement with Dr. Ernest PineHooten who presents for evaluation of redness over the surgical scar. Patient reports that 3 days ago she noticed small amount of erythema and warmth overlying her surgical scar which she thought initially it was due to healing, however over the last few days the erythema and warmth have progressed. Patient also has had severe nausea for the last 24 hours which has been constant. No fever, no chills, no vomiting. She has no pain in her right hip.  Past Medical History:  Diagnosis Date  . Anemia   . Arthritis   . Fatty liver   . Hypertension   . Lymph edema    lower extremities    Patient Active Problem List   Diagnosis Date Noted  . Low back pain 11/17/2017  . Morbid obesity (HCC) 11/17/2017  . Status post total replacement of hip 11/17/2017  . Status post total replacement of left hip 04/02/2017  . Total knee replacement status 09/21/2015  . Benign essential hypertension 09/14/2015  . Lymphedema in adult patient 09/14/2015    Past Surgical History:  Procedure Laterality Date  . CHOLECYSTECTOMY    . JOINT REPLACEMENT Left    knee, hip  . JOINT REPLACEMENT Bilateral    knee  . KNEE ARTHROPLASTY Right 09/21/2015   Procedure: COMPUTER ASSISTED TOTAL KNEE ARTHROPLASTY;  Surgeon: Donato HeinzJames P Hooten, MD;  Location: [facility] ORS;  Service: Orthopedics;  Laterality: Right;  . TONSILLECTOMY    . TOTAL HIP ARTHROPLASTY Right 11/17/2017   Procedure: TOTAL HIP ARTHROPLASTY;  Surgeon: Donato HeinzHooten, James P, MD;  Location: [facility] ORS;  Service: Orthopedics;  Laterality: Right;    Prior to Admission medications   Medication Sig Start Date End Date Taking?  Authorizing Provider  acetaminophen (TYLENOL) 325 MG tablet Take 1,300 mg every 6 (six) hours as needed by mouth for mild pain or moderate pain.     [provider]  amLODipine (NORVASC) 5 MG tablet Take 5 mg by mouth every morning.    [provider]  Ascorbic Acid (VITAMIN C) 1000 MG tablet Take 1,000 mg by mouth daily.    [provider]  aspirin EC 81 MG tablet Take 81 mg daily by mouth.    [provider]  celecoxib (CELEBREX) 200 MG capsule Take 1 capsule (200 mg total) by mouth every 12 (twelve) hours. 11/19/17   Dedra SkeensMundy, Todd, PA-C  cephALEXin (KEFLEX) 500 MG capsule Take 1 capsule (500 mg total) by mouth 3 (three) times daily for 7 days. 12/11/17 12/18/17  Nita SickleVeronese, [place], MD  dextromethorphan-guaiFENesin [facility](MUCINEX DM) 30-600 MG per 12 hr tablet Take 1 tablet by mouth 2 (two) times daily as needed for cough.    [provider]  enoxaparin (LOVENOX) 40 MG/0.4ML injection Inject 0.4 mLs (40 mg total) into the skin daily. 11/19/17   Dedra SkeensMundy, Todd, PA-C  ferrous sulfate 325 (65 FE) MG EC tablet Take 325 mg daily by mouth.     [provider]  furosemide (LASIX) 20 MG tablet Take 40 mg every morning by mouth.     [provider]  Multiple Vitamins-Minerals (MULTIVITAMIN WITH MINERALS) tablet Take 1 tablet by mouth daily.    [provider]  oxyCODONE (  OXY IR/ROXICODONE) 5 MG immediate release tablet Take 1 tablet (5 mg total) by mouth every 4 (four) hours as needed for moderate pain ((score 4 to 6)). 11/19/17   Dedra Skeens, PA-C  Potassium 99 MG TABS Take 2 tablets daily by mouth.    [provider]  sulfamethoxazole-trimethoprim (BACTRIM DS,SEPTRA DS) 800-160 MG tablet Take 1 tablet by mouth 2 (two) times daily for 10 days. 12/11/17 12/21/17  Nita Sickle, MD  traMADol (ULTRAM) 50 MG tablet Take 1-2 tablets (50-100 mg total) by mouth every 4 (four) hours as needed for moderate pain. 11/19/17   Dedra Skeens, PA-C     Allergies Patient has no known allergies.  Family History  Problem Relation Age of Onset  . Breast cancer Mother        75's    Social History Social History   Tobacco Use  . Smoking status: Never Smoker  . Smokeless tobacco: Never Used  Substance Use Topics  . Alcohol use: No  . Drug use: No    Review of Systems  Constitutional: Negative for fever. Eyes: Negative for visual changes. ENT: Negative for sore throat. Neck: No neck pain  Cardiovascular: Negative for chest pain. Respiratory: Negative for shortness of breath. Gastrointestinal: Negative for abdominal pain, vomiting or diarrhea. Genitourinary: Negative for dysuria. Musculoskeletal: Negative for back pain. Skin: Negative for rash. + redness and warmth over the surgical incision Neurological: Negative for headaches, weakness or numbness. Psych: No SI or HI  ____________________________________________   PHYSICAL EXAM:  VITAL SIGNS: ED Triage Vitals  Enc Vitals Group     BP 12/11/17 1121 135/77     Pulse Rate 12/11/17 1121 95     Resp 12/11/17 1121 16     Temp 12/11/17 1121 98.5 F (36.9 C)     Temp Source 12/11/17 1121 Oral     SpO2 12/11/17 1121 98 %     Weight 12/11/17 1120 (!) 306 lb (138.8 kg)     Height 12/11/17 1120 5\' 11"  (1.803 m)     Head Circumference --      Peak Flow --      Pain Score 12/11/17 1120 0     Pain Loc --      Pain Edu? --      Excl. in GC? --     Constitutional: Alert and oriented. Well appearing and in no apparent distress. HEENT:      Head: Normocephalic and atraumatic.         Eyes: Conjunctivae are normal. Sclera is non-icteric.       Mouth/Throat: Mucous membranes are moist.       Neck: Supple with no signs of meningismus. Cardiovascular: Regular rate and rhythm. No murmurs, gallops, or rubs. 2+ symmetrical distal pulses are present in all extremities. No JVD. Respiratory: Normal respiratory effort. Lungs are clear to auscultation bilaterally. No wheezes,  crackles, or rhonchi.  Gastrointestinal: Soft, non tender, and non distended with positive bowel sounds. No rebound or guarding. Musculoskeletal: full painless range of motion of the right hip. There is a large area of erythema and warmth overlying the surgical incision on the right, no fluctuance is palpable, no purulent discharge Neurologic: Normal speech and language. Face is symmetric. Moving all extremities. No gross focal neurologic deficits are appreciated. Skin: Skin is warm, dry and intact. No rash noted. Psychiatric: Mood and affect are normal. Speech and behavior are normal.  ____________________________________________   LABS (all labs ordered are listed, but only abnormal results are displayed)  Labs Reviewed  LACTIC ACID, PLASMA - Abnormal; Notable for the following components:      Result Value   Lactic Acid, Venous 2.1 (*)    All other components within normal limits  COMPREHENSIVE METABOLIC PANEL - Abnormal; Notable for the following components:   Glucose, Bld 122 (*)    Total Protein 8.2 (*)    All other components within normal limits  CBC WITH DIFFERENTIAL/PLATELET - Abnormal; Notable for the following components:   Neutro Abs 7.2 (*)    All other components within normal limits  CULTURE, BLOOD (ROUTINE X 2)  CULTURE, BLOOD (ROUTINE X 2)  LACTIC ACID, PLASMA  LACTIC ACID, PLASMA  URINALYSIS, COMPLETE (UACMP) WITH MICROSCOPIC   ____________________________________________  EKG  none  ____________________________________________  RADIOLOGY  none  ____________________________________________   PROCEDURES  Procedure(s) performed: None Procedures Critical Care performed:  None ____________________________________________   INITIAL IMPRESSION / ASSESSMENT AND PLAN / ED COURSE  56 y.o. female POD 7824from R hip replacement with Dr. Ernest PineHooten who presents for evaluation of progressively worsening redness and warmth over the surgical scar x 3 days and now  with nausea. Patient is well-appearing, in no distress, she has normal vital signs, she has a large area of erythema and warmth overlying the surgical scar over the right hip consistent with cellulitis. No palpable abscesses. She has full painless range of motion of the joint with no clinical suspicion for septic joint. We'll check labs including a lactate and 2 blood cultures. We'll discuss with orthopedics. We'll give her a dose of IV zosyn and vancomycin  Clinical Course as of Dec 11 1822  Mon Dec 11, 2017  1621 I paged Dr. Rosita KeaMenz, who is [facility] and I explained to him that this patient was here for a post-op complication from a surgery done by Dr. Ernest PineHooten but he told me to page [facility] to discuss my consult. I did speak with Dr. Odis LusterBowers who agrees with DC home on keflex and bactrim and close f/u with Dr. Ernest PineHooten on Wednesday. I will repeat patient's lactic acid after IVF and abx to ensure it is not trending up.  [CV]    Clinical Course User Index [CV] Don PerkingVeronese, [place], MD   _________________________ 6:22 PM on 12/11/2017 -----------------------------------------  Repeat lactic 1.6. Patient remains well appearing with no signs of sepsis. Cellulitis area demarcated. Will dc home on keflex and bactrim and close f/u with Dr. Ernest PineHooten. Discussed signs and symptoms or worsening infection and recommended that she returns to the ER if these develop.  As part of my medical decision making, I reviewed the following data within the electronic MEDICAL RECORD NUMBER Nursing notes reviewed and incorporated, Labs reviewed , Old chart reviewed, A consult was requested and obtained from this/these consultant(s) Orthopedics, Notes from prior ED visits and [place] Controlled Substance Database    Pertinent labs & imaging results that were available during my care of the patient were reviewed by me and considered in my medical decision making (see chart for  details).    ____________________________________________   FINAL CLINICAL IMPRESSION(S) / ED DIAGNOSES  Final diagnoses:  Cellulitis of right upper extremity  Post op infection      NEW MEDICATIONS STARTED DURING THIS VISIT:  ED Discharge Orders        Ordered    sulfamethoxazole-trimethoprim (BACTRIM DS,SEPTRA DS) 800-160 MG tablet  2 times daily     12/11/17 1821    cephALEXin (KEFLEX) 500 MG capsule  3 times daily     12/11/17 1821       Note:  This document was prepared using Dragon voice recognition software and may include unintentional dictation errors.    Nita Sickle, MD 12/11/17 [phone]

## 2017-12-11 NOTE — ED Notes (Signed)
This RN to bedside, informed patient would redraw blood when NS was complete per MD orders, pt states understanding, pt states understanding to use call bell when NS bolus is complete. Will continue to monitor for further patient needs.

## 2017-12-11 NOTE — ED Triage Notes (Signed)
Right hip replaced by Dr. Ernest PineHooten on 11/30.  Over past week incision has become more reddened.  Also c/o N/V/D chills.  Symptoms are similar to when her knee was replaced and she developed cellulitis.

## 2017-12-11 NOTE — Discharge Instructions (Signed)

## 2017-12-16 LAB — CULTURE, BLOOD (ROUTINE X 2)
Culture: NO GROWTH
Culture: NO GROWTH
SPECIMEN DESCRIPTION: ADEQUATE
Specimen Description: ADEQUATE

## 2017-12-20 ENCOUNTER — Encounter: Payer: Self-pay | Admitting: Occupational Therapy

## 2017-12-20 DIAGNOSIS — I890 Lymphedema, not elsewhere classified: Secondary | ICD-10-CM

## 2017-12-20 NOTE — Therapy (Signed)
[facility] [address] [place], [place], [address] Phone: [phone]   Fax:  [phone]  December 20, 2017    @CCLISTADDRESS @  Occupational Therapy Discharge Summary   Patient: Tamara FordyceRenee L Oyervides MRN: [id number] Date of Birth: 01/16/1961  Diagnosis: Lymphedema, not elsewhere classified  Referring Provider (Historical): Jerl MinaJames Hedrick, MD   The above patient was last seen by Occupational Therapy on 04/05/2017 for lymphedema care to BLE. Pt was unable to obtain recommended    Custom compression garments and hours of sleep devices for daily use due to glitches in insurance benefits and coding.  The treatment consisted of Complete Decongestive Therapy to BLE: manual lymphatic drainage, skin care, therapeutic exercise and compression wrapping. The patient is: unable to assess   Functional Status at Discharge: Unable to assess. When last seen Pt had uncontrolled BLE swelling, chronic back pain and hip pain, ongoing difficulty walking 2/2 hip OA and lymphedema, decreased balance and increased falls risk 2/2 periferal neuropathy, decreased basic and instrumental ADLs performance, impaired ability to perform productive and leisure tasks/ activities requiring extended walking and/ or standing.  See goals status outlined on 04/05/17 treatment note.    Sincerely,  Loel Dubonnetheresa Laiana Fratus, MS, OTR/L, [facility] 12/20/17 10:02 AM    [facility] [address] [place], [place], [address] Phone: [phone]   Fax:  [phone]  Patient: Tamara FordyceRenee L Molyneux MRN: [id number] Date of Birth: 04/19/1961

## 2018-08-02 ENCOUNTER — Other Ambulatory Visit: Payer: Self-pay | Admitting: Student

## 2018-08-02 DIAGNOSIS — K7400 Hepatic fibrosis, unspecified: Secondary | ICD-10-CM

## 2018-08-02 DIAGNOSIS — K760 Fatty (change of) liver, not elsewhere classified: Secondary | ICD-10-CM

## 2018-08-08 ENCOUNTER — Ambulatory Visit
Admission: RE | Admit: 2018-08-08 | Discharge: 2018-08-08 | Disposition: A | Discharge status: Home / Self Care | Payer: BLUE CROSS/BLUE SHIELD | Source: Ambulatory Visit | Attending: Student | Admitting: Student

## 2018-08-08 DIAGNOSIS — K760 Fatty (change of) liver, not elsewhere classified: Secondary | ICD-10-CM | POA: Diagnosis not present

## 2018-08-08 DIAGNOSIS — R161 Splenomegaly, not elsewhere classified: Secondary | ICD-10-CM | POA: Diagnosis not present

## 2018-08-08 DIAGNOSIS — Z9049 Acquired absence of other specified parts of digestive tract: Secondary | ICD-10-CM | POA: Diagnosis not present

## 2018-08-08 DIAGNOSIS — K7400 Hepatic fibrosis, unspecified: Secondary | ICD-10-CM

## 2019-06-27 ENCOUNTER — Other Ambulatory Visit: Payer: Self-pay | Admitting: Student

## 2019-06-27 DIAGNOSIS — K760 Fatty (change of) liver, not elsewhere classified: Secondary | ICD-10-CM

## 2019-06-27 DIAGNOSIS — K7400 Hepatic fibrosis, unspecified: Secondary | ICD-10-CM

## 2019-07-04 ENCOUNTER — Other Ambulatory Visit: Payer: Self-pay

## 2019-07-04 ENCOUNTER — Ambulatory Visit
Admission: RE | Admit: 2019-07-04 | Discharge: 2019-07-04 | Disposition: A | Discharge status: Home / Self Care | Payer: BC Managed Care – PPO | Source: Ambulatory Visit | Attending: Student | Admitting: Student

## 2019-07-04 DIAGNOSIS — K760 Fatty (change of) liver, not elsewhere classified: Secondary | ICD-10-CM | POA: Diagnosis present

## 2019-07-04 DIAGNOSIS — K7400 Hepatic fibrosis, unspecified: Secondary | ICD-10-CM

## 2019-07-05 ENCOUNTER — Inpatient Hospital Stay: Payer: BC Managed Care – PPO

## 2019-07-05 ENCOUNTER — Other Ambulatory Visit: Payer: Self-pay

## 2019-07-05 ENCOUNTER — Encounter: Payer: Self-pay | Admitting: Oncology

## 2019-07-05 ENCOUNTER — Inpatient Hospital Stay: Payer: BC Managed Care – PPO | Attending: Oncology | Admitting: Oncology

## 2019-07-05 VITALS — BP 153/82 | HR 87 | Temp 97.1°F | Resp 18 | Wt 327.7 lb

## 2019-07-05 DIAGNOSIS — Z862 Personal history of diseases of the blood and blood-forming organs and certain disorders involving the immune mechanism: Secondary | ICD-10-CM

## 2019-07-05 DIAGNOSIS — I10 Essential (primary) hypertension: Secondary | ICD-10-CM

## 2019-07-05 DIAGNOSIS — R5383 Other fatigue: Secondary | ICD-10-CM | POA: Insufficient documentation

## 2019-07-05 DIAGNOSIS — D751 Secondary polycythemia: Secondary | ICD-10-CM

## 2019-07-05 DIAGNOSIS — K760 Fatty (change of) liver, not elsewhere classified: Secondary | ICD-10-CM | POA: Diagnosis not present

## 2019-07-05 LAB — CBC WITH DIFFERENTIAL/PLATELET
Abs Immature Granulocytes: 0.06 10*3/uL (ref 0.00–0.07)
Basophils Absolute: 0.1 10*3/uL (ref 0.0–0.1)
Basophils Relative: 1 %
Eosinophils Absolute: 0.1 10*3/uL (ref 0.0–0.5)
Eosinophils Relative: 2 %
HCT: 44.5 % (ref 36.0–46.0)
Hemoglobin: 15.2 g/dL — ABNORMAL HIGH (ref 12.0–15.0)
Immature Granulocytes: 1 %
Lymphocytes Relative: 12 %
Lymphs Abs: 1 10*3/uL (ref 0.7–4.0)
MCH: 31.7 pg (ref 26.0–34.0)
MCHC: 34.2 g/dL (ref 30.0–36.0)
MCV: 92.7 fL (ref 80.0–100.0)
Monocytes Absolute: 0.7 10*3/uL (ref 0.1–1.0)
Monocytes Relative: 8 %
Neutro Abs: 6.3 10*3/uL (ref 1.7–7.7)
Neutrophils Relative %: 76 %
Platelets: 191 10*3/uL (ref 150–400)
RBC: 4.8 MIL/uL (ref 3.87–5.11)
RDW: 11.8 % (ref 11.5–15.5)
WBC: 8.2 10*3/uL (ref 4.0–10.5)
nRBC: 0 % (ref 0.0–0.2)

## 2019-07-05 LAB — TECHNOLOGIST SMEAR REVIEW

## 2019-07-05 LAB — IRON AND TIBC
Iron: 111 ug/dL (ref 28–170)
Saturation Ratios: 40 % — ABNORMAL HIGH (ref 10.4–31.8)
TIBC: 278 ug/dL (ref 250–450)
UIBC: 167 ug/dL

## 2019-07-05 LAB — FERRITIN: Ferritin: 1633 ng/mL — ABNORMAL HIGH (ref 11–307)

## 2019-07-05 LAB — TSH: TSH: 1.091 u[IU]/mL (ref 0.350–4.500)

## 2019-07-05 NOTE — Progress Notes (Signed)
New patient visit for polycythemia.  

## 2019-07-05 NOTE — Progress Notes (Signed)
Dear Tamara Dodson,  I had the pleasure seeing Tamara Dodson  for evaluation of erythrocytosis Attached is a copy of today's clinic visit note.    Thank you for this kind referral and the opportunity to involve me in participating in the care of this patient. Please do not hesitate to call me if any questions or concerns.    Rickard PatienceZhou Bartley Vuolo MD PhD [facility]          Hematology/Oncology Consult note [facility] [phone] [phone]   Patient Care Team: Jerl MinaHedrick, James, MD as PCP - General (Family Medicine)  REFERRING PROVIDER: Burman FreestoneJohnson, Michelle C, PA*  CHIEF COMPLAINTS/REASON FOR VISIT:  Evaluation of polycytosis  HISTORY OF PRESENTING ILLNESS:  Tamara FordyceRenee L Wengert is a 58 y.o. female who was seen in consultation at the request of Burman FreestoneJohnson, Michelle C, [place]* for evaluation of polycytosis/erythrocytosis Reviewed patient's recent lab work  06/27/2019 Labs showed elevated hemoglobin at 16.9,  total white count 8.5, platelet counts 225,000.  Chronic Onset, duration since 2016.  No aggravating or alleviating factors.   Associated signs or symptoms: Denies weight loss, fever, chills, night sweats. Positive for fatigue.   Context:  Smoking history: denies   Daytime somnolence: admit to feeling not refreshed in the morning. Tired.all the time.  Family history of polycythemia: Denies   Chronic fatigue. She has a history of iron deficiency after hip replacement and has been taking oral iron supplementation.   Recently seen by gastroenterology for evaluation of fatty liver and liver fibrosis.  Recent chemistry showed normal liver function testing.  Negative hepatitis panel.   Review of Systems  Constitutional: Positive for fatigue. Negative for appetite change, chills and fever.  HENT:   Negative for hearing loss and voice change.   Eyes: Negative for eye problems.  Respiratory: Negative for chest tightness  and cough.   Cardiovascular: Negative for chest pain.  Gastrointestinal: Negative for abdominal distention, abdominal pain and blood in stool.  Endocrine: Negative for hot flashes.  Genitourinary: Negative for difficulty urinating and frequency.   Musculoskeletal: Negative for arthralgias.  Skin: Negative for itching and rash.  Neurological: Negative for extremity weakness.  Hematological: Negative for adenopathy.  Psychiatric/Behavioral: Negative for confusion.    MEDICAL HISTORY:  Past Medical History:  Diagnosis Date  . Anemia   . Arthritis   . Fatty liver   . Hypertension   . Lymph edema    lower extremities    SURGICAL HISTORY: Past Surgical History:  Procedure Laterality Date  . CHOLECYSTECTOMY    . JOINT REPLACEMENT Left    knee, hip  . JOINT REPLACEMENT Bilateral    knee  . KNEE ARTHROPLASTY Right 09/21/2015   Procedure: COMPUTER ASSISTED TOTAL KNEE ARTHROPLASTY;  Surgeon: Donato HeinzJames P Hooten, MD;  Location: [facility] ORS;  Service: Orthopedics;  Laterality: Right;  . TONSILLECTOMY    . TOTAL HIP ARTHROPLASTY Right 11/17/2017   Procedure: TOTAL HIP ARTHROPLASTY;  Surgeon: Donato HeinzHooten, James P, MD;  Location: [facility] ORS;  Service: Orthopedics;  Laterality: Right;    SOCIAL HISTORY: Social History   Socioeconomic History  . Marital status: Married    Spouse name: Not on file  . Number of children: Not on file  . Years of education: Not on file  . Highest education level: Not on file  Occupational History  . Not on file  Social Needs  . Financial resource strain: Not on file  . Food insecurity    Worry: Not  on file    Inability: Not on file  . Transportation needs    Medical: Not on file    Non-medical: Not on file  Tobacco Use  . Smoking status: Never Smoker  . Smokeless tobacco: Never Used  Substance and Sexual Activity  . Alcohol use: No  . Drug use: No  . Sexual activity: Yes  Lifestyle  . Physical activity    Days per week: Not on file    Minutes per  session: Not on file  . Stress: Not on file  Relationships  . Social Musicianconnections    Talks on phone: Not on file    Gets together: Not on file    Attends religious service: Not on file    Active member of club or organization: Not on file    Attends meetings of clubs or organizations: Not on file    Relationship status: Not on file  . Intimate partner violence    Fear of current or ex partner: Not on file    Emotionally abused: Not on file    Physically abused: Not on file    Forced sexual activity: Not on file  Other Topics Concern  . Not on file  Social History Narrative  . Not on file    FAMILY HISTORY: Family History  Problem Relation Age of Onset  . Breast cancer Mother        4840's    ALLERGIES:  has No Known Allergies.  MEDICATIONS:  Current Outpatient Medications  Medication Sig Dispense Refill  . acetaminophen (TYLENOL) 325 MG tablet Take 1,300 mg every 6 (six) hours as needed by mouth for mild pain or moderate pain.     Marland Kitchen. amLODipine (NORVASC) 5 MG tablet Take 5 mg by mouth every morning.    . Ascorbic Acid (VITAMIN C) 1000 MG tablet Take 1,000 mg by mouth daily.    Marland Kitchen. aspirin EC 81 MG tablet Take 81 mg daily by mouth.    . dextromethorphan-guaiFENesin (MUCINEX DM) 30-600 MG per 12 hr tablet Take 1 tablet by mouth 2 (two) times daily as needed for cough.    . ferrous sulfate 325 (65 FE) MG EC tablet Take 325 mg daily by mouth.     . furosemide (LASIX) 20 MG tablet Take 20 mg by mouth 2 (two) times daily.     . methocarbamol (ROBAXIN) 500 MG tablet Take by mouth.    . Multiple Vitamins-Minerals (MULTIVITAMIN WITH MINERALS) tablet Take 1 tablet by mouth daily.    Marland Kitchen. omeprazole (PRILOSEC) 40 MG capsule Take by mouth.    . Potassium 99 MG TABS Take 2 tablets daily by mouth.     No current facility-administered medications for this visit.      PHYSICAL EXAMINATION: ECOG PERFORMANCE STATUS: 1 - Symptomatic but completely ambulatory Vitals:   07/05/19 0935  BP: (!)  153/82  Pulse: 87  Resp: 18  Temp: (!) 97.1 F (36.2 C)  SpO2: 97%   Filed Weights   07/05/19 0935  Weight: (!) 327 lb 11.2 oz (148.6 kg)    Physical Exam Constitutional:      General: She is not in acute distress.    Appearance: She is obese.  HENT:     Head: Normocephalic and atraumatic.  Eyes:     General: No scleral icterus.    Pupils: Pupils are equal, round, and reactive to light.  Neck:     Musculoskeletal: Normal range of motion and neck supple.  Cardiovascular:  Rate and Rhythm: Normal rate and regular rhythm.     Heart sounds: Normal heart sounds.  Pulmonary:     Effort: Pulmonary effort is normal. No respiratory distress.     Breath sounds: No wheezing.  Abdominal:     General: Bowel sounds are normal. There is no distension.     Palpations: Abdomen is soft. There is no mass.     Tenderness: There is no abdominal tenderness.  Musculoskeletal: Normal range of motion.        General: No deformity.  Skin:    General: Skin is warm and dry.     Findings: No erythema or rash.  Neurological:     Mental Status: She is alert and oriented to person, place, and time.     Cranial Nerves: No cranial nerve deficit.     Coordination: Coordination normal.  Psychiatric:        Behavior: Behavior normal.        Thought Content: Thought content normal.     RADIOGRAPHIC STUDIES: I have personally reviewed the radiological images as listed and agreed with the findings in the report. [place] Abdomen Complete  Result Date: 07/04/2019 CLINICAL DATA:  Fatty infiltration of liver.  Liver fibrosis. EXAM: ABDOMEN ULTRASOUND COMPLETE COMPARISON:  None. FINDINGS: Gallbladder: Surgically absent. Common bile duct: Diameter: 6.6 mm Liver: The liver peers enlarged measuring at least 21 cm. There is increased echogenicity throughout the liver with no focal mass. Suggested mildly nodular contour to the liver. Portal vein is patent on color Doppler imaging with normal direction of blood flow  towards the liver. IVC: No abnormality visualized. Pancreas: Visualized portion unremarkable. Spleen: The spleen is enlarged with a length of 13.5 cm and a volume of 598 cc. Right Kidney: Length: 12.5 cm. Echogenicity within normal limits. No mass or hydronephrosis visualized. Left Kidney: Length: 12.1 cm. Echogenicity within normal limits. No mass or hydronephrosis visualized. Abdominal aorta: No aneurysm visualized. Other findings: None. IMPRESSION: 1. The liver is enlarged measuring up to 21 cm. Increased echogenicity is nonspecific but may be seen with hepatic steatosis. 2. There is a suggested mildly nodular contour to the liver raising the possibility of cirrhosis. 3. Splenomegaly. Electronically Signed   By: Gerome Samavid  Williams III M.D   On: 07/04/2019 14:40     LABORATORY DATA:  I have reviewed the data as listed Lab Results  Component Value Date   WBC 8.2 07/05/2019   HGB 15.2 (H) 07/05/2019   HCT 44.5 07/05/2019   MCV 92.7 07/05/2019   PLT 191 07/05/2019   No results for input(s): NA, K, CL, CO2, GLUCOSE, BUN, CREATININE, CALCIUM, GFRNONAA, GFRAA, PROT, ALBUMIN, AST, ALT, ALKPHOS, BILITOT, BILIDIR, IBILI in the last 8760 hours. Iron/TIBC/Ferritin/ %Sat    Component Value Date/Time   IRON 111 07/05/2019 1025   TIBC 278 07/05/2019 1025   FERRITIN 1,633 (H) 07/05/2019 1025   IRONPCTSAT 40 (H) 07/05/2019 1025        ASSESSMENT & PLAN:  1. Erythrocytosis   2. History of iron deficiency anemia   3. Other fatigue   4. Fatty liver   5. Iron overload    Labs are reviewed and discussed with patient. Polycythemia (erythrocytosis) is an abnormal elevation of hemoglobin (Hgb) and/or hematocrit (Hct) in peripheral blood, and this can be caused by primary etiology, ie bone marrow mutation, or secondary etiology, ie hypoxia, smoking, androgen supplements, etc.  I will obtain erythropoietin, carbo monoxide level, rule out primary etiology, JAK2 with reflex to other mutations,  Suspect that  she may have secondary erythrocytosis due to sleep apnea.   Morbid obesity with BMI 45.  High risk for developing sleep apnea.  Fatigue, history of iron deficiency, I will check iron panel. Fatty liver disease, follows up with gastroenterology.  Recent ultrasound hepatosplenomegaly, nodularity contour indicating cirrhosis. Labs are reviewed.  Patient has significantly elevated ferritin, iron saturation. Advised patient to stop oral iron supplementation.  We will also check hemochromatosis panel at next visit.  Orders Placed This Encounter  Procedures  . CBC with Differential/Platelet    Standing Status:   Future    Number of Occurrences:   1    Standing Expiration Date:   07/04/2020  . Technologist smear review    Standing Status:   Future    Number of Occurrences:   1    Standing Expiration Date:   07/04/2020  . Erythropoietin    Standing Status:   Future    Number of Occurrences:   1    Standing Expiration Date:   07/04/2020  . Carbon monoxide, blood (performed at ref lab)    Standing Status:   Future    Number of Occurrences:   1    Standing Expiration Date:   07/04/2020  . JAK2 V617F, w Reflex to CALR/E12/MPL    Standing Status:   Future    Number of Occurrences:   1    Standing Expiration Date:   07/04/2020  . TSH    Standing Status:   Future    Number of Occurrences:   1    Standing Expiration Date:   07/04/2020  . Iron and TIBC    Standing Status:   Future    Number of Occurrences:   1    Standing Expiration Date:   07/04/2020  . Ferritin    Standing Status:   Future    Number of Occurrences:   1    Standing Expiration Date:   07/04/2020    We spent sufficient time to discuss many aspect of care, questions were answered to patient's satisfaction. The patient knows to call the clinic with any problems questions or concerns.  Cc Lavera Guise, [place]*  Return of visit: 2 weeks Thank you for this kind referral and the opportunity to participate in the care of this  patient. A copy of today's note is routed to referring provider   Earlie Server, MD, PhD 07/05/2019

## 2019-07-08 ENCOUNTER — Telehealth: Payer: Self-pay | Admitting: *Deleted

## 2019-07-08 LAB — CARBON MONOXIDE, BLOOD (PERFORMED AT REF LAB): Carbon Monoxide, Blood: 3 % (ref 0.0–3.6)

## 2019-07-08 LAB — ERYTHROPOIETIN: Erythropoietin: 11.4 m[IU]/mL (ref 2.6–18.5)

## 2019-07-08 NOTE — Telephone Encounter (Signed)
Patient notified of lab results per result note from Dr. Tasia Catchings. Patients iron levels are elevated at this time, patient advised to stop taking iron and that Dr. Tasia Catchings would like to do additional lab work this week. Patient states she has labs ordered by Tammi Klippel at [facility] later in the week, will see if we can combine labs for patient so she only has one lab visit.

## 2019-07-16 LAB — CALR + JAK2 E12-15 + MPL (REFLEXED)

## 2019-07-16 LAB — JAK2 V617F, W REFLEX TO CALR/E12/MPL

## 2019-07-18 ENCOUNTER — Encounter: Payer: Self-pay | Admitting: Oncology

## 2019-07-18 ENCOUNTER — Other Ambulatory Visit: Payer: Self-pay

## 2019-07-18 ENCOUNTER — Inpatient Hospital Stay (HOSPITAL_BASED_OUTPATIENT_CLINIC_OR_DEPARTMENT_OTHER): Payer: BC Managed Care – PPO | Admitting: Oncology

## 2019-07-18 VITALS — BP 147/89 | HR 79 | Temp 97.3°F | Resp 18 | Wt 318.4 lb

## 2019-07-18 DIAGNOSIS — D751 Secondary polycythemia: Secondary | ICD-10-CM

## 2019-07-18 DIAGNOSIS — R5383 Other fatigue: Secondary | ICD-10-CM

## 2019-07-18 DIAGNOSIS — K760 Fatty (change of) liver, not elsewhere classified: Secondary | ICD-10-CM

## 2019-07-18 HISTORY — DX: Other disorders of iron metabolism: E83.19

## 2019-07-19 NOTE — Progress Notes (Signed)
Hematology/Oncology  [facility] [phone] [phone]   Patient Care Team: Jerl MinaHedrick, James, MD as PCP - General (Family Medicine)  REFERRING PROVIDER: Jerl MinaHedrick, James, MD  REASON FOR VISIT:  Follow-up for erythrocytosis and iron overload.  HISTORY OF PRESENTING ILLNESS:  Noland FordyceRenee L Portugal is a 58 y.o. female who was seen in consultation at the request of Jerl MinaHedrick, James, MD for evaluation of polycytosis/erythrocytosis Reviewed patient's recent lab work  06/27/2019 Labs showed elevated hemoglobin at 16.9,  total white count 8.5, platelet counts 225,000.  Chronic Onset, duration since 2016.  No aggravating or alleviating factors.   Associated signs or symptoms: Denies weight loss, fever, chills, night sweats. Positive for fatigue.   Context:  Smoking history: denies   Daytime somnolence: admit to feeling not refreshed in the morning. Tired.all the time.  Family history of polycythemia: Denies   Chronic fatigue. She has a history of iron deficiency after hip replacement and has been taking oral iron supplementation.   Recently seen by gastroenterology for evaluation of fatty liver and liver fibrosis.  Recent chemistry showed normal liver function testing.  Negative hepatitis panel.  INTERVAL HISTORY Noland FordyceRenee L Tayler is a 58 y.o. female who has above history reviewed by me today presents for follow up visit for management of iron overload And erythrocytosis. Problems and complaints are listed below:   Patient has blood work done at last visit and present to discuss blood work results and further Customer service managermanagement plan.  She has no new complaints.  Continue to feel fatigued and chronic joint pain.   Review of Systems  Constitutional: Positive for fatigue. Negative for appetite change, chills and fever.  HENT:   Negative for hearing loss and voice change.   Eyes: Negative for eye problems.  Respiratory: Negative for chest tightness and  cough.   Cardiovascular: Negative for chest pain.  Gastrointestinal: Negative for abdominal distention, abdominal pain and blood in stool.  Endocrine: Negative for hot flashes.  Genitourinary: Negative for difficulty urinating and frequency.   Musculoskeletal: Positive for arthralgias.  Skin: Negative for itching and rash.  Neurological: Negative for extremity weakness.  Hematological: Negative for adenopathy.  Psychiatric/Behavioral: Negative for confusion.    MEDICAL HISTORY:  Past Medical History:  Diagnosis Date  . Anemia   . Arthritis   . Fatty liver   . Hypertension   . Iron overload 07/18/2019  . Lymph edema    lower extremities    SURGICAL HISTORY: Past Surgical History:  Procedure Laterality Date  . CHOLECYSTECTOMY    . JOINT REPLACEMENT Left    knee, hip  . JOINT REPLACEMENT Bilateral    knee  . KNEE ARTHROPLASTY Right 09/21/2015   Procedure: COMPUTER ASSISTED TOTAL KNEE ARTHROPLASTY;  Surgeon: Donato HeinzJames P Hooten, MD;  Location: [facility] ORS;  Service: Orthopedics;  Laterality: Right;  . TONSILLECTOMY    . TOTAL HIP ARTHROPLASTY Right 11/17/2017   Procedure: TOTAL HIP ARTHROPLASTY;  Surgeon: Donato HeinzHooten, James P, MD;  Location: [facility] ORS;  Service: Orthopedics;  Laterality: Right;    SOCIAL HISTORY: Social History   Socioeconomic History  . Marital status: Married    Spouse name: Not on file  . Number of children: Not on file  . Years of education: Not on file  . Highest education level: Not on file  Occupational History  . Not on file  Social Needs  . Financial resource strain: Not on file  . Food insecurity    Worry:  Not on file    Inability: Not on file  . Transportation needs    Medical: Not on file    Non-medical: Not on file  Tobacco Use  . Smoking status: Never Smoker  . Smokeless tobacco: Never Used  Substance and Sexual Activity  . Alcohol use: No  . Drug use: No  . Sexual activity: Yes  Lifestyle  . Physical activity    Days per week: Not on file     Minutes per session: Not on file  . Stress: Not on file  Relationships  . Social Musicianconnections    Talks on phone: Not on file    Gets together: Not on file    Attends religious service: Not on file    Active member of club or organization: Not on file    Attends meetings of clubs or organizations: Not on file    Relationship status: Not on file  . Intimate partner violence    Fear of current or ex partner: Not on file    Emotionally abused: Not on file    Physically abused: Not on file    Forced sexual activity: Not on file  Other Topics Concern  . Not on file  Social History Narrative  . Not on file    FAMILY HISTORY: Family History  Problem Relation Age of Onset  . Breast cancer Mother        3140's    ALLERGIES:  has No Known Allergies.  MEDICATIONS:  Current Outpatient Medications  Medication Sig Dispense Refill  . acetaminophen (TYLENOL) 325 MG tablet Take 1,300 mg every 6 (six) hours as needed by mouth for mild pain or moderate pain.     Marland Kitchen. amLODipine (NORVASC) 5 MG tablet Take 5 mg by mouth every morning.    . Ascorbic Acid (VITAMIN C) 1000 MG tablet Take 1,000 mg by mouth daily.    Marland Kitchen. aspirin EC 81 MG tablet Take 81 mg daily by mouth.    . dextromethorphan-guaiFENesin (MUCINEX DM) 30-600 MG per 12 hr tablet Take 1 tablet by mouth 2 (two) times daily as needed for cough.    . ferrous sulfate 325 (65 FE) MG EC tablet Take 325 mg daily by mouth.     . furosemide (LASIX) 20 MG tablet Take 20 mg by mouth 2 (two) times daily.     . methocarbamol (ROBAXIN) 500 MG tablet Take by mouth.    . Multiple Vitamins-Minerals (MULTIVITAMIN WITH MINERALS) tablet Take 1 tablet by mouth daily.    Marland Kitchen. omeprazole (PRILOSEC) 40 MG capsule Take by mouth.    . Potassium 99 MG TABS Take 2 tablets daily by mouth.     No current facility-administered medications for this visit.      PHYSICAL EXAMINATION: ECOG PERFORMANCE STATUS: 1 - Symptomatic but completely ambulatory Vitals:   07/18/19  1204  BP: (!) 147/89  Pulse: 79  Resp: 18  Temp: (!) 97.3 F (36.3 C)   Filed Weights   07/18/19 1204  Weight: (!) 318 lb 6 oz (144.4 kg)    Physical Exam Constitutional:      General: She is not in acute distress.    Appearance: She is obese.  HENT:     Head: Normocephalic and atraumatic.  Eyes:     General: No scleral icterus.    Pupils: Pupils are equal, round, and reactive to light.  Neck:     Musculoskeletal: Normal range of motion and neck supple.  Cardiovascular:     Rate  and Rhythm: Normal rate and regular rhythm.     Heart sounds: Normal heart sounds.  Pulmonary:     Effort: Pulmonary effort is normal. No respiratory distress.     Breath sounds: No wheezing.  Abdominal:     General: Bowel sounds are normal. There is no distension.     Palpations: Abdomen is soft. There is no mass.     Tenderness: There is no abdominal tenderness.  Musculoskeletal: Normal range of motion.        General: No deformity.  Skin:    General: Skin is warm and dry.     Findings: No erythema or rash.  Neurological:     Mental Status: She is alert and oriented to person, place, and time.     Cranial Nerves: No cranial nerve deficit.     Coordination: Coordination normal.  Psychiatric:        Behavior: Behavior normal.        Thought Content: Thought content normal.     RADIOGRAPHIC STUDIES: I have personally reviewed the radiological images as listed and agreed with the findings in the report. US Abdomen Complete  Result Date: 07/04/2019 CLINICAL DATA:  Fatty infiltration of liver.  Liver fibrosis. EXAM: ABDOMEN ULTRASOUND COMPLETE COMPARISON:  None. FINDINGS: Gallbladder: Surgically absent. Common bile duct: Diameter: 6.6 mm Liver: The liver peers enlarged measuring at least 21 cm. There is increased echogenicity throughout the liver with no focal mass. Suggested mildly nodular contour to the liver. Portal vein is patent on color Doppler imaging with normal direction of blood flow  towards the liver. IVC: No abnormality visualized. Pancreas: Visualized portion unremarkable. Spleen: The spleen is enlarged with a length of 13.5 cm and a volume of 598 cc. Right Kidney: Length: 12.5 cm. Echogenicity within normal limits. No mass or hydronephrosis visualized. Left Kidney: Length: 12.1 cm. Echogenicity within normal limits. No mass or hydronephrosis visualized. Abdominal aorta: No aneurysm visualized. Other findings: None. IMPRESSION: 1. The liver is enlarged measuring up to 21 cm. Increased echogenicity is nonspecific but may be seen with hepatic steatosis. 2. There is a suggested mildly nodular contour to the liver raising the possibility of cirrhosis. 3. Splenomegaly. Electronically Signed   By: Dorise Bullion III M.D   On: 07/04/2019 14:40     LABORATORY DATA:  I have reviewed the data as listed Lab Results  Component Value Date   WBC 8.2 07/05/2019   HGB 15.2 (H) 07/05/2019   HCT 44.5 07/05/2019   MCV 92.7 07/05/2019   PLT 191 07/05/2019   No results for input(s): NA, K, CL, CO2, GLUCOSE, BUN, CREATININE, CALCIUM, GFRNONAA, GFRAA, PROT, ALBUMIN, AST, ALT, ALKPHOS, BILITOT, BILIDIR, IBILI in the last 8760 hours. Iron/TIBC/Ferritin/ %Sat    Component Value Date/Time   IRON 111 07/05/2019 1025   TIBC 278 07/05/2019 1025   FERRITIN 1,633 (H) 07/05/2019 1025   IRONPCTSAT 40 (H) 07/05/2019 1025        ASSESSMENT & PLAN:  1. Erythrocytosis   2. Iron overload   3. Fatty liver   4. Other fatigue    #Labs are reviewed and discussed with patient. 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  I suspect she may have underlying sleep apnea.  Recommend patient to discuss with PCP for sleep study. Jak 2 with reflex to other mutations negative.  #Iron overload, Patient has been contacted and advised to stop oral iron supplementation immediately. Recommend patient to have hemochromatosis screening testing done. Reports no family history of hemochromatosis, liver cancer. She also  has [facility] and had lab work scheduled.  [facility] so that she can combine blood work done there.  Patient reports that she does not have a [facility] encounter [facility].  Recommend patient to call and clarify.  Fatty liver disease/cirrhosis, follows up with gastroenterology.  Recent ultrasound hepatosplenomegaly, nodularity contour indicating cirrhosis. In the context of cirrhosis, iron overload, I will proceed with phlebotomy 500 cc x 1 next week. Further management plan pending hemochromatosis screening testing.    We spent sufficient time to discuss many aspect of care, questions were answered to patient's satisfaction. The patient knows to call the clinic with any problems questions or concerns.  Cc Jerl MinaHedrick, James, MD  Return of visit: To be determined We spent sufficient time to discuss many aspect of care, questions were answered to patient's satisfaction. Total face to face encounter time for this patient visit was 25 min. >50% of the time was  spent in counseling and coordination of care.    Rickard PatienceZhou Chrisy Hillebrand, MD, PhD 07/19/2019

## 2019-07-25 ENCOUNTER — Other Ambulatory Visit: Payer: Self-pay

## 2019-07-25 ENCOUNTER — Inpatient Hospital Stay: Payer: BC Managed Care – PPO | Attending: Oncology

## 2019-07-25 DIAGNOSIS — D751 Secondary polycythemia: Secondary | ICD-10-CM | POA: Insufficient documentation

## 2019-07-25 NOTE — Progress Notes (Signed)
500 cc removed through therapeutic phlebotomy per order. Pt tolerated well. VSS at d/c.

## 2019-09-11 ENCOUNTER — Inpatient Hospital Stay: Payer: BC Managed Care – PPO | Attending: Oncology

## 2019-09-11 ENCOUNTER — Other Ambulatory Visit: Payer: Self-pay

## 2019-09-11 ENCOUNTER — Telehealth: Payer: Self-pay

## 2019-09-11 DIAGNOSIS — I10 Essential (primary) hypertension: Secondary | ICD-10-CM | POA: Insufficient documentation

## 2019-09-11 DIAGNOSIS — K7469 Other cirrhosis of liver: Secondary | ICD-10-CM | POA: Insufficient documentation

## 2019-09-11 DIAGNOSIS — D751 Secondary polycythemia: Secondary | ICD-10-CM | POA: Insufficient documentation

## 2019-09-11 DIAGNOSIS — Z79899 Other long term (current) drug therapy: Secondary | ICD-10-CM | POA: Insufficient documentation

## 2019-09-11 DIAGNOSIS — R7989 Other specified abnormal findings of blood chemistry: Secondary | ICD-10-CM | POA: Diagnosis not present

## 2019-09-11 LAB — FERRITIN: Ferritin: 1327 ng/mL — ABNORMAL HIGH (ref 11–307)

## 2019-09-11 LAB — CBC WITH DIFFERENTIAL/PLATELET
Abs Immature Granulocytes: 0.06 10*3/uL (ref 0.00–0.07)
Basophils Absolute: 0.1 10*3/uL (ref 0.0–0.1)
Basophils Relative: 1 %
Eosinophils Absolute: 0.2 10*3/uL (ref 0.0–0.5)
Eosinophils Relative: 2 %
HCT: 44.3 % (ref 36.0–46.0)
Hemoglobin: 15.1 g/dL — ABNORMAL HIGH (ref 12.0–15.0)
Immature Granulocytes: 1 %
Lymphocytes Relative: 16 %
Lymphs Abs: 1.6 10*3/uL (ref 0.7–4.0)
MCH: 31.1 pg (ref 26.0–34.0)
MCHC: 34.1 g/dL (ref 30.0–36.0)
MCV: 91.3 fL (ref 80.0–100.0)
Monocytes Absolute: 0.7 10*3/uL (ref 0.1–1.0)
Monocytes Relative: 7 %
Neutro Abs: 7.5 10*3/uL (ref 1.7–7.7)
Neutrophils Relative %: 73 %
Platelets: 206 10*3/uL (ref 150–400)
RBC: 4.85 MIL/uL (ref 3.87–5.11)
RDW: 11.7 % (ref 11.5–15.5)
WBC: 10 10*3/uL (ref 4.0–10.5)
nRBC: 0 % (ref 0.0–0.2)

## 2019-09-11 LAB — IRON AND TIBC
Iron: 60 ug/dL (ref 28–170)
Saturation Ratios: 22 % (ref 10.4–31.8)
TIBC: 277 ug/dL (ref 250–450)
UIBC: 217 ug/dL

## 2019-09-11 NOTE — Telephone Encounter (Signed)
Called patient to ask her to come in for labs at 3:30pm. Patient was scheduled too late in the day for [facility] labs. Left message for her to call back.

## 2019-09-12 ENCOUNTER — Inpatient Hospital Stay (HOSPITAL_BASED_OUTPATIENT_CLINIC_OR_DEPARTMENT_OTHER): Payer: BC Managed Care – PPO | Admitting: Oncology

## 2019-09-12 ENCOUNTER — Inpatient Hospital Stay: Payer: BC Managed Care – PPO

## 2019-09-12 ENCOUNTER — Other Ambulatory Visit: Payer: Self-pay

## 2019-09-12 DIAGNOSIS — K7469 Other cirrhosis of liver: Secondary | ICD-10-CM

## 2019-09-12 DIAGNOSIS — K760 Fatty (change of) liver, not elsewhere classified: Secondary | ICD-10-CM

## 2019-09-12 DIAGNOSIS — D751 Secondary polycythemia: Secondary | ICD-10-CM | POA: Diagnosis not present

## 2019-09-12 NOTE — Progress Notes (Signed)
Hematology/Oncology  [facility] [phone] Fax:([phone]   Patient Care Team: [name], MD as PCP - General (Family Medicine)  REFERRING PROVIDER: [name], MD  REASON FOR VISIT:  Follow-up for erythrocytosis and iron overload.  HISTORY OF PRESENTING ILLNESS:  Tamara Dodson is a 58 y.o. female who was seen in consultation at the request of [name], MD for evaluation of polycytosis/erythrocytosis Reviewed patient's recent lab work  06/27/2019 Labs showed elevated hemoglobin at 16.9,  total white count 8.5, platelet counts 225,000.  Chronic Onset, duration since 2016.  No aggravating or alleviating factors.   Associated signs or symptoms: Denies weight loss, fever, chills, night sweats. Positive for fatigue.   Context:  Smoking history: denies   Daytime somnolence: admit to feeling not refreshed in the morning. Tired.all the time.  Family history of polycythemia: Denies   Chronic fatigue. She has a history of iron deficiency after hip replacement and has been taking oral iron supplementation.   Recently seen by gastroenterology for evaluation of fatty liver and liver fibrosis.  Recent chemistry showed normal liver function testing.  Negative hepatitis panel.  INTERVAL HISTORY Tamara Dodson is a 58 y.o. female who has above history reviewed by me today presents for follow up visit for management of iron overload And erythrocytosis. Problems and complaints are listed below: Patient had phlebotomy 500 cc x 1 during the interval.  Tolerates well.  Denies any new complaints today. Chronic fatigue at baseline. She has stopped oral iron supplementation as suggested. Denies any pain today. She has not had sleep study yet. Feels fatigued, no exacerbation or alleviating factors.    Review of Systems  Constitutional: Positive for fatigue. Negative for appetite change, chills and fever.  HENT:   Negative for  hearing loss and voice change.   Eyes: Negative for eye problems.  Respiratory: Negative for chest tightness and cough.   Cardiovascular: Negative for chest pain.  Gastrointestinal: Negative for abdominal distention, abdominal pain and blood in stool.  Endocrine: Negative for hot flashes.  Genitourinary: Negative for difficulty urinating and frequency.   Musculoskeletal: Positive for arthralgias.  Skin: Negative for itching and rash.  Neurological: Negative for extremity weakness.  Hematological: Negative for adenopathy.  Psychiatric/Behavioral: Negative for confusion.    MEDICAL HISTORY:  Past Medical History:  Diagnosis Date  . Anemia   . Arthritis   . Fatty liver   . Hypertension   . Iron overload 07/18/2019  . Lymph edema    lower extremities    SURGICAL HISTORY: Past Surgical History:  Procedure Laterality Date  . CHOLECYSTECTOMY    . JOINT REPLACEMENT Left    knee, hip  . JOINT REPLACEMENT Bilateral    knee  . KNEE ARTHROPLASTY Right 09/21/2015   Procedure: COMPUTER ASSISTED TOTAL KNEE ARTHROPLASTY;  Surgeon: Dereck Leep, MD;  Location: [facility] ORS;  Service: Orthopedics;  Laterality: Right;  . TONSILLECTOMY    . TOTAL HIP ARTHROPLASTY Right 11/17/2017   Procedure: TOTAL HIP ARTHROPLASTY;  Surgeon: Dereck Leep, MD;  Location: [facility] ORS;  Service: Orthopedics;  Laterality: Right;    SOCIAL HISTORY: Social History   Socioeconomic History  . Marital status: Married    Spouse name: Not on file  . Number of children: Not on file  . Years of education: Not on file  . Highest education level: Not on file  Occupational History  . Not on file  Social Needs  .  Financial resource strain: Not on file  . Food insecurity    Worry: Not on file    Inability: Not on file  . Transportation needs    Medical: Not on file    Non-medical: Not on file  Tobacco Use  . Smoking status: Never Smoker  . Smokeless tobacco: Never Used  Substance and Sexual Activity  . Alcohol  use: No  . Drug use: No  . Sexual activity: Yes  Lifestyle  . Physical activity    Days per week: Not on file    Minutes per session: Not on file  . Stress: Not on file  Relationships  . Social Musicianconnections    Talks on phone: Not on file    Gets together: Not on file    Attends religious service: Not on file    Active member of club or organization: Not on file    Attends meetings of clubs or organizations: Not on file    Relationship status: Not on file  . Intimate partner violence    Fear of current or ex partner: Not on file    Emotionally abused: Not on file    Physically abused: Not on file    Forced sexual activity: Not on file  Other Topics Concern  . Not on file  Social History Narrative  . Not on file    FAMILY HISTORY: Family History  Problem Relation Age of Onset  . Breast cancer Mother        6740's    ALLERGIES:  has No Known Allergies.  MEDICATIONS:  Current Outpatient Medications  Medication Sig Dispense Refill  . acetaminophen (TYLENOL) 325 MG tablet Take 1,300 mg every 6 (six) hours as needed by mouth for mild pain or moderate pain.     Marland Kitchen. amLODipine (NORVASC) 5 MG tablet Take 5 mg by mouth every morning.    . Ascorbic Acid (VITAMIN C) 1000 MG tablet Take 1,000 mg by mouth daily.    Marland Kitchen. aspirin EC 81 MG tablet Take 81 mg daily by mouth.    . dextromethorphan-guaiFENesin (MUCINEX DM) 30-600 MG per 12 hr tablet Take 1 tablet by mouth 2 (two) times daily as needed for cough.    . ferrous sulfate 325 (65 FE) MG EC tablet Take 325 mg daily by mouth.     . furosemide (LASIX) 20 MG tablet Take 20 mg by mouth 2 (two) times daily.     . methocarbamol (ROBAXIN) 500 MG tablet Take by mouth.    . Multiple Vitamins-Minerals (MULTIVITAMIN WITH MINERALS) tablet Take 1 tablet by mouth daily.    Marland Kitchen. omeprazole (PRILOSEC) 40 MG capsule Take by mouth.    . Potassium 99 MG TABS Take 2 tablets daily by mouth.     No current facility-administered medications for this visit.       PHYSICAL EXAMINATION: ECOG PERFORMANCE STATUS: 1 - Symptomatic but completely ambulatory Vitals:   09/12/19 1045  BP: (!) 159/76  Pulse: 77  Resp: 16  Temp: (!) 97.2 F (36.2 C)   Filed Weights   09/12/19 1045  Weight: (!) 315 lb (142.9 kg)    Physical Exam Constitutional:      General: She is not in acute distress.    Appearance: She is obese.  HENT:     Head: Normocephalic and atraumatic.  Eyes:     General: No scleral icterus.    Pupils: Pupils are equal, round, and reactive to light.  Neck:     Musculoskeletal: Normal range  of motion and neck supple.  Cardiovascular:     Rate and Rhythm: Normal rate and regular rhythm.     Heart sounds: Normal heart sounds.  Pulmonary:     Effort: Pulmonary effort is normal. No respiratory distress.     Breath sounds: No wheezing.  Abdominal:     General: Bowel sounds are normal. There is no distension.     Palpations: Abdomen is soft.     Tenderness: There is no abdominal tenderness.  Musculoskeletal: Normal range of motion.        General: No deformity.  Skin:    General: Skin is warm and dry.     Findings: No erythema or rash.  Neurological:     Mental Status: She is alert and oriented to person, place, and time.     Cranial Nerves: No cranial nerve deficit.     Coordination: Coordination normal.  Psychiatric:        Behavior: Behavior normal.        Thought Content: Thought content normal.     RADIOGRAPHIC STUDIES: I have personally reviewed the radiological images as listed and agreed with the findings in the report. US Abdomen Complete  Result Date: 07/04/2019 CLINICAL DATA:  Fatty infiltration of liver.  Liver fibrosis. EXAM: ABDOMEN ULTRASOUND COMPLETE COMPARISON:  None. FINDINGS: Gallbladder: Surgically absent. Common bile duct: Diameter: 6.6 mm Liver: The liver peers enlarged measuring at least 21 cm. There is increased echogenicity throughout the liver with no focal mass. Suggested mildly nodular contour  to the liver. Portal vein is patent on color Doppler imaging with normal direction of blood flow towards the liver. IVC: No abnormality visualized. Pancreas: Visualized portion unremarkable. Spleen: The spleen is enlarged with a length of 13.5 cm and a volume of 598 cc. Right Kidney: Length: 12.5 cm. Echogenicity within normal limits. No mass or hydronephrosis visualized. Left Kidney: Length: 12.1 cm. Echogenicity within normal limits. No mass or hydronephrosis visualized. Abdominal aorta: No aneurysm visualized. Other findings: None. IMPRESSION: 1. The liver is enlarged measuring up to 21 cm. Increased echogenicity is nonspecific but may be seen with hepatic steatosis. 2. There is a suggested mildly nodular contour to the liver raising the possibility of cirrhosis. 3. Splenomegaly. Electronically Signed   By: Gerome Sam III M.D   On: 07/04/2019 14:40    LABORATORY DATA:  I have reviewed the data as listed Lab Results  Component Value Date   WBC 10.0 09/11/2019   HGB 15.1 (H) 09/11/2019   HCT 44.3 09/11/2019   MCV 91.3 09/11/2019   PLT 206 09/11/2019   No results for input(s): NA, K, CL, CO2, GLUCOSE, BUN, CREATININE, CALCIUM, GFRNONAA, GFRAA, PROT, ALBUMIN, AST, ALT, ALKPHOS, BILITOT, BILIDIR, IBILI in the last 8760 hours. Iron/TIBC/Ferritin/ %Sat    Component Value Date/Time   IRON 60 09/11/2019 1536   TIBC 277 09/11/2019 1536   FERRITIN 1,327 (H) 09/11/2019 1536   IRONPCTSAT 22 09/11/2019 1536        ASSESSMENT & PLAN:  1. Iron overload   2. Fatty liver   3. Other cirrhosis of liver (HCC)   4. Erythrocytosis     #Labs are reviewed and discussed with patient. 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 I recommend patient to discuss with primary care provider for getting sleep study evaluation.  Iron panel was reviewed and discussed with patient.  She is status phlebotomy 500 cc x 1.  Tolerated well. Ferritin level  has  decreased to 1300s. Iron saturation has decreased to 22. No family history of hemochromatosis or liver cancer. Checking hemochromatosis DNA mutation screening testing. Ultrasound abdomen was done in July 2020 was independently reviewed by me. Patient has fatty liver disease, liver enlarged measuring up to 21 cm. Mildly nodular contour to the liver raising the possibility of cirrhosis.  Her elevated ferritin and TSAT  saturation can be compounded by chronic liver disease. Single H53D mutation.  Plan Liver biopsy to further evaluate iron store. Discuss with [facility] gastroenterology Harmon Dun and she agrees with the plan. Discussed with patient and she agrees with the plan.  Check PT and PTT prior to liver biopsy.   We spent sufficient time to discuss many aspect of care, questions were answered to patient's satisfaction. The patient knows to call the clinic with any problems questions or concerns.  Cc [name], MD  Return of visit:  Follow up after Liver biopsy.    Rickard Patience, MD, PhD 09/12/2019

## 2019-09-13 ENCOUNTER — Encounter: Payer: Self-pay | Admitting: Oncology

## 2019-09-13 ENCOUNTER — Telehealth: Payer: Self-pay

## 2019-09-13 ENCOUNTER — Other Ambulatory Visit: Payer: Self-pay | Admitting: Oncology

## 2019-09-13 NOTE — Telephone Encounter (Signed)
After discussion with GI, Dr. Tasia Catchings wants pt to get a US guided biopsy of liver instead of the US liver Elastography.  I will fax over the order form to speciaty scheduling.  She also wants her to have labs 1-2 days before the biopsy.  Will get that scheduled when we find out when the biopsy is scheduled.   Patient is aware of the change in plan

## 2019-09-16 NOTE — Telephone Encounter (Signed)
Patient informed of liver bx scheduled for 09/24/19 arrive @ 10:00 for 11:00 appt.

## 2019-09-17 ENCOUNTER — Ambulatory Visit: Payer: BC Managed Care – PPO

## 2019-09-19 LAB — HEMOCHROMATOSIS DNA-PCR(C282Y,H63D)

## 2019-09-23 ENCOUNTER — Other Ambulatory Visit: Payer: Self-pay | Admitting: Radiology

## 2019-09-23 ENCOUNTER — Inpatient Hospital Stay: Payer: BC Managed Care – PPO | Attending: Oncology

## 2019-09-23 ENCOUNTER — Other Ambulatory Visit: Payer: Self-pay

## 2019-09-23 DIAGNOSIS — D751 Secondary polycythemia: Secondary | ICD-10-CM | POA: Insufficient documentation

## 2019-09-23 LAB — PROTIME-INR
INR: 1 (ref 0.8–1.2)
Prothrombin Time: 13.3 seconds (ref 11.4–15.2)

## 2019-09-23 LAB — APTT: aPTT: 27 seconds (ref 24–36)

## 2019-09-24 ENCOUNTER — Ambulatory Visit
Admission: RE | Admit: 2019-09-24 | Discharge: 2019-09-24 | Disposition: A | Discharge status: Home / Self Care | Payer: BC Managed Care – PPO | Source: Ambulatory Visit | Attending: Oncology | Admitting: Oncology

## 2019-09-24 ENCOUNTER — Other Ambulatory Visit: Payer: Self-pay

## 2019-09-24 DIAGNOSIS — K760 Fatty (change of) liver, not elsewhere classified: Secondary | ICD-10-CM | POA: Diagnosis not present

## 2019-09-24 DIAGNOSIS — K7469 Other cirrhosis of liver: Secondary | ICD-10-CM | POA: Diagnosis not present

## 2019-09-24 LAB — CBC
HCT: 44.4 % (ref 36.0–46.0)
Hemoglobin: 15.2 g/dL — ABNORMAL HIGH (ref 12.0–15.0)
MCH: 31.3 pg (ref 26.0–34.0)
MCHC: 34.2 g/dL (ref 30.0–36.0)
MCV: 91.4 fL (ref 80.0–100.0)
Platelets: 203 10*3/uL (ref 150–400)
RBC: 4.86 MIL/uL (ref 3.87–5.11)
RDW: 11.9 % (ref 11.5–15.5)
WBC: 8.1 10*3/uL (ref 4.0–10.5)
nRBC: 0 % (ref 0.0–0.2)

## 2019-09-24 MED ORDER — MIDAZOLAM HCL 5 MG/5ML IJ SOLN
INTRAMUSCULAR | Status: AC | PRN
  Administered 2019-09-24: 0.5 mg via INTRAVENOUS

## 2019-09-24 MED ORDER — SODIUM CHLORIDE 0.9 % IV SOLN
INTRAVENOUS | Status: DC
  Administered 2019-09-24: 1000 mL via INTRAVENOUS

## 2019-09-24 MED ORDER — MIDAZOLAM HCL 5 MG/5ML IJ SOLN
INTRAMUSCULAR | Status: AC
  Filled 2019-09-24: qty 5

## 2019-09-24 MED ORDER — FENTANYL CITRATE (PF) 100 MCG/2ML IJ SOLN
INTRAMUSCULAR | Status: AC
  Filled 2019-09-24: qty 2

## 2019-09-24 MED ORDER — ONDANSETRON HCL 4 MG/2ML IJ SOLN
INTRAMUSCULAR | Status: AC
  Filled 2019-09-24: qty 2

## 2019-09-24 MED ORDER — ONDANSETRON HCL 4 MG/2ML IJ SOLN
4.0000 mg | Freq: Once | INTRAMUSCULAR | Status: AC
  Administered 2019-09-24: 4 mg via INTRAVENOUS

## 2019-09-24 MED ORDER — FENTANYL CITRATE (PF) 100 MCG/2ML IJ SOLN
INTRAMUSCULAR | Status: AC | PRN
  Administered 2019-09-24: 25 ug via INTRAVENOUS

## 2019-09-24 MED ORDER — HYDROCODONE-ACETAMINOPHEN 5-325 MG PO TABS: 1.0000 | ORAL_TABLET | ORAL | Status: DC | PRN

## 2019-09-26 LAB — SURGICAL PATHOLOGY

## 2019-10-01 ENCOUNTER — Ambulatory Visit: Payer: BC Managed Care – PPO

## 2019-10-11 ENCOUNTER — Other Ambulatory Visit: Payer: Self-pay | Admitting: Family Medicine

## 2019-10-11 DIAGNOSIS — Z1231 Encounter for screening mammogram for malignant neoplasm of breast: Secondary | ICD-10-CM

## 2020-01-02 ENCOUNTER — Ambulatory Visit
Admission: RE | Admit: 2020-01-02 | Discharge: 2020-01-02 | Disposition: A | Discharge status: Home / Self Care | Payer: BC Managed Care – PPO | Source: Ambulatory Visit | Attending: Family Medicine | Admitting: Family Medicine

## 2020-01-02 DIAGNOSIS — Z1231 Encounter for screening mammogram for malignant neoplasm of breast: Secondary | ICD-10-CM | POA: Diagnosis present

## 2020-06-26 ENCOUNTER — Other Ambulatory Visit: Payer: Self-pay | Admitting: Student

## 2020-06-26 DIAGNOSIS — R140 Abdominal distension (gaseous): Secondary | ICD-10-CM

## 2020-06-26 DIAGNOSIS — R1013 Epigastric pain: Secondary | ICD-10-CM

## 2020-06-26 DIAGNOSIS — R198 Other specified symptoms and signs involving the digestive system and abdomen: Secondary | ICD-10-CM

## 2020-06-26 DIAGNOSIS — K760 Fatty (change of) liver, not elsewhere classified: Secondary | ICD-10-CM

## 2020-06-26 DIAGNOSIS — K7400 Hepatic fibrosis, unspecified: Secondary | ICD-10-CM

## 2020-07-09 ENCOUNTER — Ambulatory Visit
Admission: RE | Admit: 2020-07-09 | Discharge: 2020-07-09 | Disposition: A | Discharge status: Home / Self Care | Payer: BC Managed Care – PPO | Source: Ambulatory Visit | Attending: Student | Admitting: Student

## 2020-07-09 ENCOUNTER — Other Ambulatory Visit: Payer: Self-pay

## 2020-07-09 DIAGNOSIS — R140 Abdominal distension (gaseous): Secondary | ICD-10-CM | POA: Insufficient documentation

## 2020-07-09 DIAGNOSIS — K760 Fatty (change of) liver, not elsewhere classified: Secondary | ICD-10-CM

## 2020-07-09 DIAGNOSIS — R1013 Epigastric pain: Secondary | ICD-10-CM | POA: Insufficient documentation

## 2020-07-09 DIAGNOSIS — R198 Other specified symptoms and signs involving the digestive system and abdomen: Secondary | ICD-10-CM

## 2020-07-09 DIAGNOSIS — K7400 Hepatic fibrosis, unspecified: Secondary | ICD-10-CM | POA: Diagnosis present

## 2020-11-04 NOTE — Progress Notes (Signed)
MRN : [id number]  Tamara Dodson is a 59 y.o. (1961/09/09) female who presents with chief complaint of No chief complaint on file. Marland Kitchen  History of Present Illness:   Patient is seen for evaluation of leg pain and leg swelling. The patient first noticed the swelling remotely. The swelling is associated with pain and some discoloration.  She has worked with the lymphedema clinic for quite some time and at times has been seen 3 times a week.  She also has a lymphedema pump which was upgraded from the original pump she obtained through our office years ago.  She uses this several times a week as well.  Unfortunately, she has not found any graduated compression stockings or wraps that have been comfortable and so she has not been using graduated compression.  The patient denies claudication symptoms.  The patient denies symptoms consistent with rest pain.  The patient denies and extensive history of DJD and LS spine disease.  The patient has no had any past angiography, interventions or vascular surgery.  Elevation makes the leg symptoms better, dependency makes them much worse. There is no history of ulcerations. The patient denies any recent changes in medications.  The patient denies a history of DVT or PE. There is no prior history of phlebitis. There is no history of primary lymphedema.  No history of malignancies. No history of trauma or groin or pelvic surgery. There is no history of radiation treatment to the groin or pelvis  The patient denies amaurosis fugax or recent TIA symptoms. There are no recent neurological changes noted. The patient denies recent episodes of angina or shortness of breath  No outpatient medications have been marked as taking for the 11/05/20 encounter (Appointment) with Gilda Crease, Latina Craver, MD.    Past Medical History:  Diagnosis Date  . Anemia   . Arthritis   . Fatty liver   . Hypertension   . Iron overload 07/18/2019  . Lymph edema    lower  extremities    Past Surgical History:  Procedure Laterality Date  . CHOLECYSTECTOMY    . JOINT REPLACEMENT Left    knee, hip  . JOINT REPLACEMENT Bilateral    knee  . KNEE ARTHROPLASTY Right 09/21/2015   Procedure: COMPUTER ASSISTED TOTAL KNEE ARTHROPLASTY;  Surgeon: Donato Heinz, MD;  Location: [facility] ORS;  Service: Orthopedics;  Laterality: Right;  . TONSILLECTOMY    . TOTAL HIP ARTHROPLASTY Right 11/17/2017   Procedure: TOTAL HIP ARTHROPLASTY;  Surgeon: Donato Heinz, MD;  Location: [facility] ORS;  Service: Orthopedics;  Laterality: Right;    Social History Social History   Tobacco Use  . Smoking status: Never Smoker  . Smokeless tobacco: Never Used  Vaping Use  . Vaping Use: Never used  Substance Use Topics  . Alcohol use: No  . Drug use: No    Family History Family History  Problem Relation Age of Onset  . Breast cancer Mother        70's  No family history of bleeding/clotting disorders, porphyria or autoimmune disease   No Known Allergies   REVIEW OF SYSTEMS (Negative unless checked)  Constitutional: [] Weight loss  [] Fever  [] Chills Cardiac: [] Chest pain   [] Chest pressure   [] Palpitations   [] Shortness of breath when laying flat   [] Shortness of breath with exertion. Vascular:  [] Pain in legs with walking   [x] Pain in legs at rest  [] History of DVT   [] Phlebitis   [x] Swelling in legs   [] Varicose  veins   [] Non-healing ulcers Pulmonary:   [] Uses home oxygen   [] Productive cough   [] Hemoptysis   [] Wheeze  [] COPD   [] Asthma Neurologic:  [] Dizziness   [] Seizures   [] History of stroke   [] History of TIA  [] Aphasia   [] Vissual changes   [] Weakness or numbness in arm   [] Weakness or numbness in leg Musculoskeletal:   [] Joint swelling   [x] Joint pain   [] Low back pain Hematologic:  [] Easy bruising  [] Easy bleeding   [] Hypercoagulable state   [] Anemic Gastrointestinal:  [] Diarrhea   [] Vomiting  [x] Gastroesophageal reflux/heartburn   [] Difficulty  swallowing. Genitourinary:  [] Chronic kidney disease   [] Difficult urination  [] Frequent urination   [] Blood in urine Skin:  [] Rashes   [] Ulcers  Psychological:  [] History of anxiety   []  History of major depression.  Physical Examination  There were no vitals filed for this visit. There is no height or weight on file to calculate BMI. Gen: WD/WN, NAD Head: [place]/AT, No temporalis wasting.  Ear/Nose/Throat: Hearing grossly intact, nares w/o erythema or drainage, poor dentition Eyes: PER, EOMI, sclera nonicteric.  Neck: Supple, no masses.  No bruit or JVD.  Pulmonary:  Good air movement, clear to auscultation bilaterally, no use of accessory muscles.  Cardiac: RRR, normal S1, S2, no Murmurs. Vascular: scattered varicosities present bilaterally.  Severe lymphedema present bilaterally  Vessel Right Left  Radial Palpable Palpable  Gastrointestinal: soft, non-distended. No guarding/no peritoneal signs.  Musculoskeletal: M/S 5/5 throughout.  No deformity or atrophy.  Neurologic: CN 2-12 intact. Pain and light touch intact in extremities.  Symmetrical.  Speech is fluent. Motor exam as listed above. Psychiatric: Judgment intact, Mood & affect appropriate for pt's clinical situation. Dermatologic: No rashes or ulcers noted.  No changes consistent with cellulitis. Lymph : + lichenification or skin changes of chronic lymphedema.  CBC Lab Results  Component Value Date   WBC 8.1 09/24/2019   HGB 15.2 (H) 09/24/2019   HCT 44.4 09/24/2019   MCV 91.4 09/24/2019   PLT 203 09/24/2019    BMET    Component Value Date/Time   NA 136 12/11/2017 1121   NA 137 02/20/2013 0723   K 4.3 12/11/2017 1121   K 3.7 02/20/2013 0723   CL 102 12/11/2017 1121   CL 104 02/20/2013 0723   CO2 25 12/11/2017 1121   CO2 26 02/20/2013 0723   GLUCOSE 122 (H) 12/11/2017 1121   GLUCOSE 126 (H) 02/20/2013 0723   BUN 11 12/11/2017 1121   BUN 6 (L) 02/20/2013 0723   CREATININE 0.65 12/11/2017 1121   CREATININE 0.54  (L) 02/20/2013 0723   CALCIUM 9.5 12/11/2017 1121   CALCIUM 8.7 02/20/2013 0723   GFRNONAA >60 12/11/2017 1121   GFRNONAA >60 02/20/2013 0723   GFRAA >60 12/11/2017 1121   GFRAA >60 02/20/2013 0723   CrCl cannot be calculated (Patient's most recent lab result is older than the maximum 21 days allowed.).  COAG Lab Results  Component Value Date   INR 1.0 09/23/2019   INR 0.92 11/02/2017   INR 1.00 09/03/2015    Radiology No results found.   Assessment/Plan 1. Lymphedema in adult patient  No surgery or intervention at this point in time.    I have reviewed my discussion with the patient regarding lymphedema and why it  causes symptoms.  I have stressed to the patient that without routine essentially daily graduated compression that her lymphedema will not begin good control.  That the intermittent use of a lymphedema pump and  or being seen at the lymphedema clinic is an adjunct to daily compression.  Patient will again investigate compression wraps as she is really not found stockings that are suitable.  She will begin wearing graduated compression stockings class 1 (20-30 mmHg) on a daily basis a prescription was given. The patient is reminded to put the stockings on first thing in the morning and removing them in the evening. The patient is instructed specifically not to sleep in the stockings.   In addition, behavioral modification throughout the day will be continued.  This will include frequent elevation (such as in a recliner), use of over the counter pain medications as needed and exercise such as walking.  I have reviewed systemic causes for chronic edema such as liver, kidney and cardiac etiologies and there does not appear to be any significant changes in these organ systems over the past year.  The patient is under the impression that these organ systems are all stable and unchanged.    The patient will continue aggressive use of the  lymph pump.  This will continue to improve  the edema control and prevent sequela such as ulcers and infections.   The patient will follow-up with me on an annual basis.    2. Benign essential hypertension Continue antihypertensive medications as already ordered, these medications have been reviewed and there are no changes at this time.   3. Status post total knee replacement, unspecified laterality Continue NSAID medications as already ordered, these medications have been reviewed and there are no changes at this time.  Continued activity and therapy was stressed.  It was also discussed the surgeries that she has had on her hips and her knees just by virtue of the surgical trauma also increase her lymphedema.  Stressing that this is yet another reason to be very aggressive with her compression therapy     Levora Dredge, MD  11/04/2020 11:19 AM

## 2020-11-05 ENCOUNTER — Encounter (INDEPENDENT_AMBULATORY_CARE_PROVIDER_SITE_OTHER): Payer: Self-pay | Admitting: Vascular Surgery

## 2020-11-05 ENCOUNTER — Other Ambulatory Visit: Payer: Self-pay

## 2020-11-05 ENCOUNTER — Ambulatory Visit (INDEPENDENT_AMBULATORY_CARE_PROVIDER_SITE_OTHER): Payer: BC Managed Care – PPO | Admitting: Vascular Surgery

## 2020-11-05 VITALS — BP 161/93 | HR 90 | Resp 16 | Ht 70.5 in | Wt 337.4 lb

## 2020-11-05 DIAGNOSIS — I890 Lymphedema, not elsewhere classified: Secondary | ICD-10-CM

## 2020-11-05 DIAGNOSIS — I10 Essential (primary) hypertension: Secondary | ICD-10-CM | POA: Diagnosis not present

## 2020-11-05 DIAGNOSIS — Z96659 Presence of unspecified artificial knee joint: Secondary | ICD-10-CM

## 2020-12-03 ENCOUNTER — Other Ambulatory Visit: Payer: Self-pay | Admitting: Orthopedic Surgery

## 2020-12-03 DIAGNOSIS — M5412 Radiculopathy, cervical region: Secondary | ICD-10-CM

## 2020-12-26 ENCOUNTER — Ambulatory Visit
Admission: RE | Admit: 2020-12-26 | Discharge: 2020-12-26 | Disposition: A | Discharge status: Home / Self Care | Payer: BC Managed Care – PPO | Source: Ambulatory Visit | Attending: Orthopedic Surgery | Admitting: Orthopedic Surgery

## 2020-12-26 ENCOUNTER — Other Ambulatory Visit: Payer: Self-pay

## 2020-12-26 DIAGNOSIS — M5412 Radiculopathy, cervical region: Secondary | ICD-10-CM

## 2021-01-07 ENCOUNTER — Other Ambulatory Visit: Payer: Self-pay | Admitting: Gastroenterology

## 2021-01-07 DIAGNOSIS — R1013 Epigastric pain: Secondary | ICD-10-CM

## 2021-01-07 DIAGNOSIS — R6881 Early satiety: Secondary | ICD-10-CM

## 2021-01-13 DIAGNOSIS — M5412 Radiculopathy, cervical region: Secondary | ICD-10-CM | POA: Insufficient documentation

## 2021-01-14 ENCOUNTER — Telehealth: Payer: Self-pay

## 2021-01-14 NOTE — Telephone Encounter (Signed)
-----   Message from Rickard Patience, MD sent at 01/14/2021  2:42 PM EST ----- Neurosurgery just sent me a message about her follow up.  Please reach out to see if she is willing to come back.  Lab iron tibc ferritin, cbc , LFT, MD +/- Phlebotomy  next 1-2 weeks.

## 2021-01-14 NOTE — Telephone Encounter (Signed)
Patient notified.  Please contact patient to get her scheduled as MD recommends.

## 2021-01-14 NOTE — Telephone Encounter (Signed)
Done.. 02/01/21 date per pt request due to her work sched.

## 2021-01-15 NOTE — Telephone Encounter (Signed)
Per MD,  just cbc and lft,no need to repeat iron panel. Labs added.

## 2021-01-21 DIAGNOSIS — R7989 Other specified abnormal findings of blood chemistry: Secondary | ICD-10-CM | POA: Insufficient documentation

## 2021-01-21 DIAGNOSIS — K900 Celiac disease: Secondary | ICD-10-CM | POA: Insufficient documentation

## 2021-01-21 DIAGNOSIS — K7469 Other cirrhosis of liver: Secondary | ICD-10-CM | POA: Insufficient documentation

## 2021-01-21 DIAGNOSIS — K760 Fatty (change of) liver, not elsewhere classified: Secondary | ICD-10-CM | POA: Insufficient documentation

## 2021-01-21 HISTORY — DX: Other specified abnormal findings of blood chemistry: R79.89

## 2021-01-22 ENCOUNTER — Other Ambulatory Visit: Payer: Self-pay

## 2021-01-22 ENCOUNTER — Other Ambulatory Visit
Admission: RE | Admit: 2021-01-22 | Discharge: 2021-01-22 | Disposition: A | Discharge status: Home / Self Care | Payer: BC Managed Care – PPO | Source: Ambulatory Visit | Attending: Internal Medicine | Admitting: Internal Medicine

## 2021-01-22 ENCOUNTER — Encounter: Payer: Self-pay | Admitting: Internal Medicine

## 2021-01-22 DIAGNOSIS — R768 Other specified abnormal immunological findings in serum: Secondary | ICD-10-CM | POA: Diagnosis not present

## 2021-01-22 DIAGNOSIS — Z20822 Contact with and (suspected) exposure to covid-19: Secondary | ICD-10-CM | POA: Insufficient documentation

## 2021-01-22 DIAGNOSIS — K3189 Other diseases of stomach and duodenum: Secondary | ICD-10-CM | POA: Diagnosis not present

## 2021-01-22 DIAGNOSIS — Z01812 Encounter for preprocedural laboratory examination: Secondary | ICD-10-CM | POA: Insufficient documentation

## 2021-01-22 DIAGNOSIS — Z7982 Long term (current) use of aspirin: Secondary | ICD-10-CM | POA: Diagnosis not present

## 2021-01-22 DIAGNOSIS — K766 Portal hypertension: Secondary | ICD-10-CM | POA: Diagnosis present

## 2021-01-22 DIAGNOSIS — K2950 Unspecified chronic gastritis without bleeding: Secondary | ICD-10-CM | POA: Diagnosis not present

## 2021-01-22 DIAGNOSIS — K2980 Duodenitis without bleeding: Secondary | ICD-10-CM | POA: Diagnosis not present

## 2021-01-22 DIAGNOSIS — Z79899 Other long term (current) drug therapy: Secondary | ICD-10-CM | POA: Diagnosis not present

## 2021-01-23 LAB — SARS CORONAVIRUS 2 (TAT 6-24 HRS): SARS Coronavirus 2: NEGATIVE

## 2021-01-25 ENCOUNTER — Other Ambulatory Visit: Payer: Self-pay | Admitting: Neurosurgery

## 2021-01-25 ENCOUNTER — Other Ambulatory Visit: Payer: Self-pay

## 2021-01-25 ENCOUNTER — Ambulatory Visit: Payer: BC Managed Care – PPO | Admitting: Certified Registered Nurse Anesthetist

## 2021-01-25 ENCOUNTER — Encounter: Payer: Self-pay | Admitting: Internal Medicine

## 2021-01-25 ENCOUNTER — Ambulatory Visit
Admission: RE | Admit: 2021-01-25 | Discharge: 2021-01-25 | Disposition: A | Discharge status: Home / Self Care | Payer: BC Managed Care – PPO | Attending: Internal Medicine | Admitting: Internal Medicine

## 2021-01-25 ENCOUNTER — Encounter
Admission: RE | Disposition: A | Discharge status: Home / Self Care | Payer: Self-pay | Source: Home / Self Care | Attending: Internal Medicine

## 2021-01-25 DIAGNOSIS — Z20822 Contact with and (suspected) exposure to covid-19: Secondary | ICD-10-CM | POA: Insufficient documentation

## 2021-01-25 DIAGNOSIS — R768 Other specified abnormal immunological findings in serum: Secondary | ICD-10-CM | POA: Insufficient documentation

## 2021-01-25 DIAGNOSIS — K766 Portal hypertension: Secondary | ICD-10-CM | POA: Insufficient documentation

## 2021-01-25 DIAGNOSIS — K2980 Duodenitis without bleeding: Secondary | ICD-10-CM | POA: Insufficient documentation

## 2021-01-25 DIAGNOSIS — Z7982 Long term (current) use of aspirin: Secondary | ICD-10-CM | POA: Insufficient documentation

## 2021-01-25 DIAGNOSIS — K2950 Unspecified chronic gastritis without bleeding: Secondary | ICD-10-CM | POA: Insufficient documentation

## 2021-01-25 DIAGNOSIS — K3189 Other diseases of stomach and duodenum: Secondary | ICD-10-CM | POA: Insufficient documentation

## 2021-01-25 DIAGNOSIS — Z79899 Other long term (current) drug therapy: Secondary | ICD-10-CM | POA: Insufficient documentation

## 2021-01-25 HISTORY — DX: Morbid (severe) obesity due to excess calories: E66.01

## 2021-01-25 HISTORY — PX: ESOPHAGOGASTRODUODENOSCOPY: SHX5428

## 2021-01-25 HISTORY — DX: Low back pain, unspecified: M54.50

## 2021-01-25 SURGERY — EGD (ESOPHAGOGASTRODUODENOSCOPY)
Anesthesia: General

## 2021-01-25 MED ORDER — LIDOCAINE HCL (CARDIAC) PF 100 MG/5ML IV SOSY
PREFILLED_SYRINGE | INTRAVENOUS | Status: DC | PRN
Start: 2021-01-25 — End: 2021-01-25
  Administered 2021-01-25: 100 mg via INTRATRACHEAL

## 2021-01-25 MED ORDER — PROPOFOL 500 MG/50ML IV EMUL
INTRAVENOUS | Status: DC | PRN
  Administered 2021-01-25: 160 ug/kg/min via INTRAVENOUS

## 2021-01-25 MED ORDER — PROPOFOL 10 MG/ML IV BOLUS
INTRAVENOUS | Status: DC | PRN
  Administered 2021-01-25: 70 mg via INTRAVENOUS

## 2021-01-25 MED ORDER — SODIUM CHLORIDE 0.9 % IV SOLN: INTRAVENOUS | Status: DC

## 2021-01-25 NOTE — Op Note (Signed)
[facility] Gastroenterology Patient Name: Tamara Dodson Procedure Date: 01/25/2021 10:45 AM MRN: [id number] Account #: [id number] Date of Birth: [birth year] Admit Type: Outpatient Age: 60 Room: [facility] ENDO ROOM 2 Gender: Female Note Status: Finalized Procedure:             Upper GI endoscopy Indications:           Positive celiac serologies, Portal venous hypertension Providers:             Boykin Nearing. Norma Fredrickson MD, MD Referring MD:          Rhona Leavens. Burnett Sheng, MD (Referring MD) Medicines:             Propofol per Anesthesia Complications:         No immediate complications. Procedure:             Pre-Anesthesia Assessment:                        - The risks and benefits of the procedure and the                         sedation options and risks were discussed with the                         patient. All questions were answered and informed                         consent was obtained.                        - Patient identification and proposed procedure were                         verified prior to the procedure by the nurse. The                         procedure was verified in the procedure room.                        - ASA Grade Assessment: III - A patient with severe                         systemic disease.                        - After reviewing the risks and benefits, the patient                         was deemed in satisfactory condition to undergo the                         procedure.                        After obtaining informed consent, the endoscope was                         passed under direct vision. Throughout the procedure,                         the  patient's blood pressure, pulse, and oxygen                         saturations were monitored continuously. The Endoscope                         was introduced through the mouth, and advanced to the                         third part of duodenum. The upper GI endoscopy was                          accomplished without difficulty. The patient tolerated                         the procedure well. Findings:      There is no endoscopic evidence of inflammation, mucosal abnormalities       or varices in the entire esophagus.      Diffuse portal hypertensive gastropathy was found in the gastric body.       This was biopsied with a cold forceps for possible portal hypertensive       gastropathy.      There is no endoscopic evidence of varices in the cardia.      Flattening was found in the duodenal bulb, scalloped mucosa was found in       the second portion of the duodenum and scalloped mucosa was found in the       third portion of the duodenum. Two biopsies were obtained with cold       forceps for evaluation of celiac sprue in the duodenal bulb, as well as       two biopsies in the second portion of the duodenum and two biopsies in       the third portion of the duodenum.      The exam was otherwise without abnormality. Impression:            - Portal hypertensive gastropathy. Biopsied.                        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                        - The examination was otherwise normal.                        - Biopsies performed in the duodenal bulb, in the                         second portion of the duodenum and in the third                         portion of the duodenum. Recommendation:        - Patient has a contact number available for                         emergencies. The signs and symptoms of potential  delayed complications were discussed with the patient.                         Return to normal activities tomorrow. Written                         discharge instructions were provided to the patient.                        - Resume previous diet.                        - Continue present medications.                        - Await pathology results.                        -  Proceed with planned cervical discectomy as                         scheduled with Dr. Venetia Night.                        - Return to my office in 6 months.                        - Gluten free diet.                        - The findings and recommendations were discussed with                         the patient. Procedure Code(s):     --- Professional ---                        [id number], Esophagogastroduodenoscopy, flexible,                         transoral; with biopsy, single or multiple Diagnosis Code(s):     --- Professional ---                        R76.8, Other specified abnormal immunological findings                         in serum                        K31.89, Other diseases of stomach and duodenum                        K76.6, Portal hypertension CPT copyright 2019 American Medical Association. All rights reserved. The codes documented in this report are preliminary and upon coder review may  be revised to meet current compliance requirements. Stanton Kidney MD, MD 01/25/2021 11:00:43 AM This report has been signed electronically. Number of Addenda: 0 Note Initiated On: 01/25/2021 10:45 AM Estimated Blood Loss:  Estimated blood loss: none.      [facility]

## 2021-01-25 NOTE — Anesthesia Postprocedure Evaluation (Signed)
Anesthesia Post Note  Patient: Tamara Dodson  Procedure(s) Performed: ESOPHAGOGASTRODUODENOSCOPY (EGD) (N/A )  Patient location during evaluation: Endoscopy Anesthesia Type: General Level of consciousness: awake and alert Pain management: pain level controlled Vital Signs Assessment: post-procedure vital signs reviewed and stable Respiratory status: spontaneous breathing, nonlabored ventilation, respiratory function stable and patient connected to nasal cannula oxygen Cardiovascular status: blood pressure returned to baseline and stable Postop Assessment: no apparent nausea or vomiting Anesthetic complications: no   No complications documented.   Last Vitals:  Vitals:   01/25/21 1109 01/25/21 1119  BP: (!) 151/81 (!) 151/81  Pulse: 90 89  Resp: 19 18  Temp:    SpO2: 97% 98%    Last Pain:  Vitals:   01/25/21 1119  TempSrc:   PainSc: 0-No pain                 Corinda Gubler

## 2021-01-25 NOTE — H&P (Signed)
Outpatient short stay form Pre-procedure 01/25/2021 10:33 AM Tamara Bowden K. Norma Fredrickson, M.D.  Primary Physician: [name], M.D.  Reason for visit:  Celiac Sprue, Cirrhosis, Portal Hypetension  History of present illness:  Patient with positive TTG Ab for celiac disease, asymptomatic as long as following a gluten free diet (Not currently on strict dietary protocol). Patient with history of cirrhosis presumably secondary to NAFLD. Has a nondescript iron overload syndrome but is negative for hemochromatosis. Patient denies intractable heartburn, dysphagia, hemetemesis, abdominal pain, nausea or vomiting.     Current Facility-Administered Medications:  .  0.9 %  sodium chloride infusion, , Intravenous, Continuous, Laurell Coalson, Boykin Nearing, MD  Medications Prior to Admission  Medication Sig Dispense Refill Last Dose  . acetaminophen (TYLENOL) 325 MG tablet Take 1,300 mg every 6 (six) hours as needed by mouth for mild pain or moderate pain.    Past Week at Unknown time  . amLODipine (NORVASC) 5 MG tablet Take 5 mg by mouth every morning.   01/25/2021 at 0300a  . Ascorbic Acid (VITAMIN C) 1000 MG tablet Take 1,000 mg by mouth daily.   01/24/2021 at 0800  . aspirin EC 81 MG tablet Take 81 mg daily by mouth.   01/24/2021 at 0900  . Cholecalciferol (VITAMIN D3) 10 MCG (400 UNIT) CHEW Chew 400 Units by mouth.   01/24/2021 at 0800  . furosemide (LASIX) 20 MG tablet Take 20 mg by mouth 2 (two) times daily.    01/24/2021 at 0800  . pantoprazole (PROTONIX) 40 MG tablet Take 40 mg by mouth daily.   01/24/2021 at 0800  . Potassium 99 MG TABS Take 2 tablets daily by mouth.   01/24/2021 at 0800  . zinc gluconate 50 MG tablet Take 50 mg by mouth daily.   01/24/2021 at 0800  . dextromethorphan-guaiFENesin (MUCINEX DM) 30-600 MG per 12 hr tablet Take 1 tablet by mouth 2 (two) times daily as needed for cough. (Patient not taking: Reported on 01/22/2021)   Not Taking at Unknown time  . ferrous sulfate 325 (65 FE) MG EC tablet Take 325 mg by  mouth daily.     . magnesium 30 MG tablet Take 30 mg by mouth daily. (Patient not taking: Reported on 01/22/2021)   Not Taking at Unknown time  . methocarbamol (ROBAXIN) 500 MG tablet Take by mouth.     . Multiple Vitamins-Minerals (MULTIVITAMIN WITH MINERALS) tablet Take 1 tablet by mouth daily.     Marland Kitchen omeprazole (PRILOSEC) 40 MG capsule Take by mouth. (Patient not taking: Reported on 01/22/2021)   Not Taking at Unknown time     Allergies  Allergen Reactions  . Other     Gluten Protein     Past Medical History:  Diagnosis Date  . Anemia   . Arthritis    osteoarthrosis  . Fatty liver   . Hypertension   . Iron overload 07/18/2019  . Lumbago   . Lymph edema    lower extremities  . Morbid obesity (HCC)     Review of systems:  Otherwise negative.    Physical Exam  Gen: Alert, oriented. Appears stated age.  HEENT: [place]/AT. PERRLA. Lungs: CTA, no wheezes. CV: RR nl S1, S2. Abd: soft, benign, no masses. BS+ Ext: No edema. Pulses 2+    Planned procedures: Proceed with Esophagogastroduodenoscopy. The patient understands the nature of the planned procedure, indications, risks, alternatives and potential complications including but not limited to bleeding, infection, perforation, damage to internal organs and possible oversedation/side effects from anesthesia. The  patient agrees and gives consent to proceed.  Please refer to procedure notes for findings, recommendations and patient disposition/instructions.     Tamara Dodson K. Norma Fredrickson, M.D. Gastroenterology 01/25/2021  10:33 AM

## 2021-01-25 NOTE — Interval H&P Note (Signed)
History and Physical Interval Note:  01/25/2021 10:36 AM  Tamara Dodson  has presented today for surgery, with the diagnosis of Celiac Disease/ SPRUE.  The various methods of treatment have been discussed with the patient and family. After consideration of risks, benefits and other options for treatment, the patient has consented to  Procedure(s) with comments: ESOPHAGOGASTRODUODENOSCOPY (EGD) (N/A) - C-19 test on 01/22/2021 Requesting to be the last case due to her transportation as a surgical intervention.  The patient's history has been reviewed, patient examined, no change in status, stable for surgery.  I have reviewed the patient's chart and labs.  Questions were answered to the patient's satisfaction.     [place], [place]

## 2021-01-25 NOTE — Transfer of Care (Signed)
Immediate Anesthesia Transfer of Care Note  Patient: Tamara Dodson  Procedure(s) Performed: ESOPHAGOGASTRODUODENOSCOPY (EGD) (N/A )  Patient Location: PACU  Anesthesia Type:General  Level of Consciousness: awake, alert  and oriented  Airway & Oxygen Therapy: Pt sponatenously breathing  Post-op Assessment: Report given to RN and Post -op Vital signs reviewed and stable  Post vital signs: Reviewed and stable  Last Vitals:  Vitals Value Taken Time  BP 121/63 01/25/21 1059  Temp 36.1 C 01/25/21 1059  Pulse 86 01/25/21 1059  Resp 20 01/25/21 1059  SpO2 97 % 01/25/21 1059  Vitals shown include unvalidated device data.  Last Pain:  Vitals:   01/25/21 1059  TempSrc: Temporal  PainSc: 0-No pain         Complications: No complications documented.

## 2021-01-25 NOTE — Anesthesia Preprocedure Evaluation (Signed)
Anesthesia Evaluation  Patient identified by MRN, date of birth, ID band Patient awake    Reviewed: Allergy & Precautions, H&P , NPO status , Patient's Chart, lab work & pertinent test results, reviewed documented beta blocker date and time   History of Anesthesia Complications Negative for: history of anesthetic complications  Airway Mallampati: III  TM Distance: >3 FB Neck ROM: full    Dental no notable dental hx.    Pulmonary neg pulmonary ROS, neg sleep apnea, neg COPD, Patient abstained from smoking.Not current smoker,    Pulmonary exam normal breath sounds clear to auscultation       Cardiovascular Exercise Tolerance: Good METShypertension, On Medications (-) CAD and (-) Past MI Normal cardiovascular exam(-) dysrhythmias  Rhythm:regular Rate:Normal - Systolic murmurs    Neuro/Psych negative neurological ROS  negative psych ROS   GI/Hepatic negative GI ROS, Neg liver ROS, neg GERD  ,  Endo/Other  neg diabetesMorbid obesity  Renal/GU negative Renal ROS  negative genitourinary   Musculoskeletal   Abdominal (+) + obese,   Peds  Hematology negative hematology ROS (+) anemia ,   Anesthesia Other Findings Past Medical History: No date: Anemia No date: Arthritis No date: Fatty liver No date: Hypertension No date: Lymph edema     Comment:  lower extremities Past Surgical History: No date: CHOLECYSTECTOMY No date: JOINT REPLACEMENT; Left     Comment:  knee, hip No date: JOINT REPLACEMENT; Bilateral     Comment:  knee 09/21/2015: KNEE ARTHROPLASTY; Right     Comment:  Procedure: COMPUTER ASSISTED TOTAL KNEE ARTHROPLASTY;                Surgeon: Donato Heinz, MD;  Location: [facility] ORS;                Service: Orthopedics;  Laterality: Right; No date: TONSILLECTOMY BMI    Body Mass Index:  43.29 kg/m     Reproductive/Obstetrics negative OB ROS                             Anesthesia  Physical  Anesthesia Plan  ASA: III  Anesthesia Plan: General   Post-op Pain Management:    Induction: Intravenous  PONV Risk Score and Plan: 3 and Ondansetron, Propofol infusion and TIVA  Airway Management Planned: Nasal Cannula  Additional Equipment: None  Intra-op Plan:   Post-operative Plan:   Informed Consent: I have reviewed the patients History and Physical, chart, labs and discussed the procedure including the risks, benefits and alternatives for the proposed anesthesia with the patient or authorized representative who has indicated his/her understanding and acceptance.     Dental Advisory Given  Plan Discussed with: CRNA  Anesthesia Plan Comments: (Discussed risks of anesthesia with patient, including possibility of difficulty with spontaneous ventilation under anesthesia necessitating airway intervention, PONV, and rare risks such as cardiac or respiratory or neurological events. Patient understands. Patient informed about increased incidence of above perioperative risk due to high BMI. Patient understands. )        Anesthesia Quick Evaluation

## 2021-01-26 ENCOUNTER — Encounter: Payer: Self-pay | Admitting: Internal Medicine

## 2021-01-26 LAB — SURGICAL PATHOLOGY

## 2021-02-01 ENCOUNTER — Inpatient Hospital Stay: Payer: BC Managed Care – PPO | Attending: Oncology

## 2021-02-01 ENCOUNTER — Inpatient Hospital Stay (HOSPITAL_BASED_OUTPATIENT_CLINIC_OR_DEPARTMENT_OTHER): Payer: BC Managed Care – PPO | Admitting: Oncology

## 2021-02-01 ENCOUNTER — Encounter: Payer: Self-pay | Admitting: Oncology

## 2021-02-01 ENCOUNTER — Inpatient Hospital Stay: Payer: BC Managed Care – PPO

## 2021-02-01 DIAGNOSIS — Z79899 Other long term (current) drug therapy: Secondary | ICD-10-CM | POA: Insufficient documentation

## 2021-02-01 DIAGNOSIS — K760 Fatty (change of) liver, not elsewhere classified: Secondary | ICD-10-CM

## 2021-02-01 DIAGNOSIS — Z148 Genetic carrier of other disease: Secondary | ICD-10-CM

## 2021-02-01 DIAGNOSIS — D751 Secondary polycythemia: Secondary | ICD-10-CM | POA: Insufficient documentation

## 2021-02-01 LAB — CBC WITH DIFFERENTIAL/PLATELET
Abs Immature Granulocytes: 0.06 10*3/uL (ref 0.00–0.07)
Basophils Absolute: 0.1 10*3/uL (ref 0.0–0.1)
Basophils Relative: 1 %
Eosinophils Absolute: 0.2 10*3/uL (ref 0.0–0.5)
Eosinophils Relative: 2 %
HCT: 44 % (ref 36.0–46.0)
Hemoglobin: 15.2 g/dL — ABNORMAL HIGH (ref 12.0–15.0)
Immature Granulocytes: 1 %
Lymphocytes Relative: 15 %
Lymphs Abs: 1.4 10*3/uL (ref 0.7–4.0)
MCH: 31.5 pg (ref 26.0–34.0)
MCHC: 34.5 g/dL (ref 30.0–36.0)
MCV: 91.1 fL (ref 80.0–100.0)
Monocytes Absolute: 0.8 10*3/uL (ref 0.1–1.0)
Monocytes Relative: 9 %
Neutro Abs: 6.5 10*3/uL (ref 1.7–7.7)
Neutrophils Relative %: 72 %
Platelets: 205 10*3/uL (ref 150–400)
RBC: 4.83 MIL/uL (ref 3.87–5.11)
RDW: 12.5 % (ref 11.5–15.5)
WBC: 9 10*3/uL (ref 4.0–10.5)
nRBC: 0 % (ref 0.0–0.2)

## 2021-02-01 LAB — HEPATIC FUNCTION PANEL
ALT: 31 U/L (ref 0–44)
AST: 31 U/L (ref 15–41)
Albumin: 4.4 g/dL (ref 3.5–5.0)
Alkaline Phosphatase: 75 U/L (ref 38–126)
Bilirubin, Direct: <0.1 mg/dL (ref 0.0–0.2)
Total Bilirubin: 0.8 mg/dL (ref 0.3–1.2)
Total Protein: 8.1 g/dL (ref 6.5–8.1)

## 2021-02-01 NOTE — Progress Notes (Signed)
Phlebotomy done per md order. 500 ml. Pt tolerated well . Pt discharged stable. Return as scheduled.

## 2021-02-01 NOTE — Progress Notes (Signed)
Hematology/Oncology  [facility] Telephone:([phone] Fax:([phone]) [phone]   Patient Care Team: [name], MD as PCP - General (Family Medicine) Rickard Patience, MD as Consulting Physician (Hematology and Oncology)  REFERRING PROVIDER: [name], MD  REASON FOR VISIT:  Follow-up for erythrocytosis and iron overload.  HISTORY OF PRESENTING ILLNESS:  Tamara Dodson is a 60 y.o. female who was seen in consultation at the request of [name], MD for evaluation of polycytosis/erythrocytosis Reviewed patient's recent lab work  06/27/2019 Labs showed elevated hemoglobin at 16.9,  total white count 8.5, platelet counts 225,000.  Chronic Onset, duration since 2016.  No aggravating or alleviating factors.   Associated signs or symptoms: Denies weight loss, fever, chills, night sweats. Positive for fatigue.   Context:  Smoking history: denies   Daytime somnolence: admit to feeling not refreshed in the morning. Tired.all the time.  Family history of polycythemia: Denies   Chronic fatigue. She has a history of iron deficiency after hip replacement and has been taking oral iron supplementation.   Recently seen by gastroenterology for evaluation of fatty liver and liver fibrosis.  Recent chemistry showed normal liver function testing.  Negative hepatitis panel.  INTERVAL HISTORY Tamara Dodson is a 60 y.o. female who has above history reviewed by me today presents for follow up visit for management of iron overload And erythrocytosis. Problems and complaints are listed below: Patient was last seen by me in September 2020.  Then she has had evaluation by gastroenterology as well as liver biopsy.  She lost follow-up after.  Recently patient has an elective neurosurgery and I was contacted by neurosurgeon Dr. Marcell Barlow.  Patient got surgery clearance from gastroenterologist Dr. Norma Fredrickson saw Patient few months ago.  Patient was also advised to establish  care with me Patient has questions about her liver biopsy when to further discuss. She has not had sleep study yet.  Chronic fatigue..    Review of Systems  Constitutional: Positive for fatigue. Negative for appetite change, chills and fever.  HENT:   Negative for hearing loss and voice change.   Eyes: Negative for eye problems.  Respiratory: Negative for chest tightness and cough.   Cardiovascular: Negative for chest pain.  Gastrointestinal: Negative for abdominal distention, abdominal pain and blood in stool.  Endocrine: Negative for hot flashes.  Genitourinary: Negative for difficulty urinating and frequency.   Musculoskeletal: Positive for arthralgias.  Skin: Negative for itching and rash.  Neurological: Negative for extremity weakness.  Hematological: Negative for adenopathy.  Psychiatric/Behavioral: Negative for confusion.    MEDICAL HISTORY:  Past Medical History:  Diagnosis Date  . Anemia   . Arthritis    osteoarthrosis  . Fatty liver   . Hypertension   . Iron overload 07/18/2019  . Lumbago   . Lymph edema    lower extremities  . Morbid obesity (HCC)     SURGICAL HISTORY: Past Surgical History:  Procedure Laterality Date  . CHOLECYSTECTOMY    . ESOPHAGOGASTRODUODENOSCOPY N/A 01/25/2021   Procedure: ESOPHAGOGASTRODUODENOSCOPY (EGD);  Surgeon: Toledo, Boykin Nearing, MD;  Location: [facility];  Service: Gastroenterology;  Laterality: N/A;  C-19 test on 01/22/2021 Requesting to be the last case due to her transportation  . JOINT REPLACEMENT Left    knee, hip  . JOINT REPLACEMENT Bilateral    knee  . KNEE ARTHROPLASTY Right 09/21/2015   Procedure: COMPUTER ASSISTED TOTAL KNEE ARTHROPLASTY;  Surgeon: Donato Heinz, MD;  Location: [facility] ORS;  Service: Orthopedics;  Laterality: Right;  . TONSILLECTOMY    . TOTAL HIP ARTHROPLASTY Right 11/17/2017   Procedure: TOTAL HIP ARTHROPLASTY;  Surgeon: Donato Heinz, MD;  Location: [facility] OR[facility];  Service: Orthopedics;  Laterality:  Right;    SOCIAL HISTORY: Social History   Socioeconomic History  . Marital status: Married    Spouse name: Casimiro Needle   . Number of children: 3  . Years of education: Not on file  . Highest education level: Not on file  Occupational History  . Not on file  Tobacco Use  . Smoking status: Never Smoker  . Smokeless tobacco: Never Used  Vaping Use  . Vaping Use: Never used  Substance and Sexual Activity  . Alcohol use: No  . Drug use: No  . Sexual activity: Yes  Other Topics Concern  . Not on file  Social History Narrative  . Not on file   Social Determinants of Health   Financial Resource Strain: Not on file  Food Insecurity: Not on file  Transportation Needs: Not on file  Physical Activity: Not on file  Stress: Not on file  Social Connections: Not on file  Intimate Partner Violence: Not on file    FAMILY HISTORY: Family History  Problem Relation Age of Onset  . Breast cancer Mother        81's    ALLERGIES:  is allergic to other.  MEDICATIONS:  Current Outpatient Medications  Medication Sig Dispense Refill  . acetaminophen (TYLENOL) 325 MG tablet Take 1,300 mg every 6 (six) hours as needed by mouth for mild pain or moderate pain.     Marland Kitchen amLODipine (NORVASC) 5 MG tablet Take 5 mg by mouth every morning.    . Ascorbic Acid (VITAMIN C) 1000 MG tablet Take 1,000 mg by mouth daily.    Marland Kitchen aspirin EC 81 MG tablet Take 81 mg daily by mouth.    . Cholecalciferol (VITAMIN D3) 10 MCG (400 UNIT) CHEW Chew 400 Units by mouth.    . furosemide (LASIX) 20 MG tablet Take 20 mg by mouth 2 (two) times daily.     . magnesium 30 MG tablet Take 30 mg by mouth daily.    . Multiple Vitamins-Minerals (MULTIVITAMIN WITH MINERALS) tablet Take 1 tablet by mouth daily.    . pantoprazole (PROTONIX) 40 MG tablet Take 40 mg by mouth daily.    . Potassium 99 MG TABS Take 2 tablets daily by mouth.    . zinc gluconate 50 MG tablet Take 50 mg by mouth daily.    Marland Kitchen dextromethorphan-guaiFENesin  (MUCINEX DM) 30-600 MG per 12 hr tablet Take 1 tablet by mouth 2 (two) times daily as needed for cough. (Patient not taking: No sig reported)    . ferrous sulfate 325 (65 FE) MG EC tablet Take 325 mg by mouth daily. (Patient not taking: Reported on 02/01/2021)    . methocarbamol (ROBAXIN) 500 MG tablet Take by mouth. (Patient not taking: Reported on 02/01/2021)    . omeprazole (PRILOSEC) 40 MG capsule Take by mouth. (Patient not taking: Reported on 02/01/2021)     No current facility-administered medications for this visit.     PHYSICAL EXAMINATION: ECOG PERFORMANCE STATUS: 1 - Symptomatic but completely ambulatory Vitals:   02/01/21 1401  BP: (!) 154/92  Pulse: 94  Resp: 18  Temp: 98.9 F (37.2 C)   Filed Weights   02/01/21 1401  Weight: (!) 342 lb 3.2 oz (155.2 kg)    Physical Exam Constitutional:  General: She is not in acute distress.    Appearance: She is obese.  HENT:     Head: Normocephalic and atraumatic.  Eyes:     General: No scleral icterus.    Pupils: Pupils are equal, round, and reactive to light.  Cardiovascular:     Rate and Rhythm: Normal rate and regular rhythm.     Heart sounds: Normal heart sounds.  Pulmonary:     Effort: Pulmonary effort is normal. No respiratory distress.     Breath sounds: No wheezing.  Abdominal:     General: Bowel sounds are normal. There is no distension.     Palpations: Abdomen is soft.     Tenderness: There is no abdominal tenderness.  Musculoskeletal:        General: No deformity. Normal range of motion.     Cervical back: Normal range of motion and neck supple.  Skin:    General: Skin is warm and dry.     Findings: No erythema or rash.  Neurological:     Mental Status: She is alert and oriented to person, place, and time.     Cranial Nerves: No cranial nerve deficit.     Coordination: Coordination normal.  Psychiatric:        Behavior: Behavior normal.        Thought Content: Thought content normal.      RADIOGRAPHIC STUDIES: I have personally reviewed the radiological images as listed and agreed with the findings in the report. MR CERVICAL SPINE WO CONTRAST  Result Date: 12/27/2020 CLINICAL DATA:  Initial evaluation for pain in bilateral arms, right worse than left, with numbness in right deltoid and hand for 3 months. EXAM: MRI CERVICAL SPINE WITHOUT CONTRAST TECHNIQUE: Multiplanar, multisequence MR imaging of the cervical spine was performed. No intravenous contrast was administered. COMPARISON:  None available. FINDINGS: Alignment: Straightening with mild reversal of the normal cervical lordosis. Trace anterolisthesis of T2 on T3, chronic and facet mediated. Vertebrae: Vertebral body height maintained without acute or chronic fracture. Bone marrow signal intensity within normal limits. No discrete or worrisome osseous lesions. No abnormal marrow edema. Cord: Signal intensity within the cervical spinal cord is within normal limits. Please note that evaluation is mildly limited by motion artifact on this exam. Posterior Fossa, vertebral arteries, paraspinal tissues: Visualized brain and posterior fossa within normal limits. Craniocervical junction normal. Paraspinous and prevertebral soft tissues within normal limits. Normal intravascular flow voids seen within the vertebral arteries bilaterally. Disc levels: C2-C3: Normal interspace. Mild right-sided facet hypertrophy. No canal or foraminal stenosis. C3-C4: Broad-based posterior disc osteophyte complex flattens and largely effaces the ventral thecal sac, asymmetric to the right. Associated cord flattening without cord signal changes. Moderate spinal stenosis. Superimposed right greater than left uncovertebral hypertrophy with resultant mild to moderate right worse than left C4 foraminal stenosis. C4-C5: Broad-based lobulated posterior disc osteophyte complex indents and effaces the ventral thecal sac. Slightly more focal right subarticular component  noted (series 6, image 14). Secondary flattening of the ventral cord, greater on the right. No definite cord signal changes. Resultant severe spinal stenosis. Superimposed uncovertebral hypertrophy with resultant mild to moderate right with moderate left C5 foraminal stenosis. C5-C6: Diffuse disc bulge with bilateral uncovertebral hypertrophy. Flattening and partial effacement of the ventral thecal sac with resultant mild-to-moderate spinal stenosis. Moderate left with mild-to-moderate right C6 foraminal narrowing. C6-C7: Broad-based central disc osteophyte complex indents the ventral thecal sac. Mild spinal stenosis without cord deformity. Moderate left with mild right C7 foraminal stenosis. C7-T1:  Tiny central disc protrusion indents the ventral thecal sac (series 5, image 25). Mild right-sided facet hypertrophy. No canal or foraminal stenosis. T1-2: Normal interspace. Moderate left with mild right facet hypertrophy. No stenosis. T2-3: Trace anterolisthesis. Negative interspace. Moderate bilateral facet hypertrophy. Resultant mild canal with moderate right and mild left foraminal narrowing. IMPRESSION: 1. Multilevel cervical spondylosis with resultant moderate to severe diffuse spinal stenosis at C3-4 through C6-7, most pronounced at C4-5. 2. Multifactorial degenerative changes with resultant mild to moderate bilateral C4 through C7 foraminal stenosis as above. Electronically Signed   By: Rise Mu M.D.   On: 12/27/2020 03:54    LABORATORY DATA:  I have reviewed the data as listed Lab Results  Component Value Date   WBC 9.0 02/01/2021   HGB 15.2 (H) 02/01/2021   HCT 44.0 02/01/2021   MCV 91.1 02/01/2021   PLT 205 02/01/2021   Recent Labs    02/01/21 1348  PROT 8.1  ALBUMIN 4.4  AST 31  ALT 31  ALKPHOS 75  BILITOT 0.8  BILIDIR <0.1  IBILI NOT CALCULATED   Iron/TIBC/Ferritin/ %Sat    Component Value Date/Time   IRON 60 09/11/2019 1536   TIBC 277 09/11/2019 1536   FERRITIN  1,327 (H) 09/11/2019 1536   IRONPCTSAT 22 09/11/2019 1536        ASSESSMENT & PLAN:  1. Iron overload   2. 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 Liver biopsy was reviewed and discussed with patient. Moderate parenchymal and sinusoidal cirrhosis, septal fibrosis with areas of early bridging fibrosis.  At least stage II.  Focal and minimal steatosis with no evidence of steatohepatitis.  NAFLD score 1 out of 8. I discussed with patient that heterozygous hemochromatosis carriers usually do not become symptomatic from iron overload.  However in the context of liver stage II fibrosis, I recommend to strictly maintain her ferritin level less than 100, preferably less than 50. Patient has had iron panel done on 01/07/2021 at [facility]. Ferritin is above 1500.  Iron saturation 24. Proceed with phlebotomy 500 cc x 1 today. 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 Discussed with patient about seeing primary care provider for discussion of underlying sleep apnea evaluation.  Fatty liver disease, recommend lifestyle modification including exercise, healthy diet.  Avoid alcohol. July 2020 ultrasound abdomen showed fatty liver disease, liver enlarged measuring up to 21 cm. Mildly nodular contour to the liver raising the possibility of cirrhosis.  Her elevated ferritin and TSAT  saturation can be compounded by chronic liver disease.  Discussed about the recommendation of first-degree relatives to be tested for hemochromatosis mutations. We spent sufficient time to discuss many aspect of care, questions were answered to patient's  satisfaction. The patient knows to call the clinic with any problems questions or concerns.  Cc [name], MD  Return of visit: 3 weeks after spine surgery.   Rickard Patience, MD, PhD 02/01/2021

## 2021-02-01 NOTE — Progress Notes (Signed)
Patient here for follow up. No new concerns voiced.  °

## 2021-02-11 ENCOUNTER — Inpatient Hospital Stay: Payer: BC Managed Care – PPO

## 2021-02-11 DIAGNOSIS — D751 Secondary polycythemia: Secondary | ICD-10-CM | POA: Diagnosis not present

## 2021-02-11 NOTE — Progress Notes (Signed)
Pt had a 500 ml phlebotomy today. Tolerated well. No complaints at time of discharge.

## 2021-02-12 ENCOUNTER — Other Ambulatory Visit: Payer: Self-pay

## 2021-02-12 ENCOUNTER — Encounter
Admission: RE | Admit: 2021-02-12 | Discharge: 2021-02-12 | Disposition: A | Discharge status: Home / Self Care | Payer: BC Managed Care – PPO | Source: Ambulatory Visit | Attending: Neurosurgery | Admitting: Neurosurgery

## 2021-02-12 DIAGNOSIS — Z01818 Encounter for other preprocedural examination: Secondary | ICD-10-CM | POA: Diagnosis present

## 2021-02-12 DIAGNOSIS — I10 Essential (primary) hypertension: Secondary | ICD-10-CM | POA: Diagnosis not present

## 2021-02-12 HISTORY — DX: Unspecified cirrhosis of liver: K74.60

## 2021-02-12 LAB — CBC
HCT: 42.8 % (ref 36.0–46.0)
Hemoglobin: 15.2 g/dL — ABNORMAL HIGH (ref 12.0–15.0)
MCH: 31.9 pg (ref 26.0–34.0)
MCHC: 35.5 g/dL (ref 30.0–36.0)
MCV: 89.9 fL (ref 80.0–100.0)
Platelets: 243 10*3/uL (ref 150–400)
RBC: 4.76 MIL/uL (ref 3.87–5.11)
RDW: 12.5 % (ref 11.5–15.5)
WBC: 9.7 10*3/uL (ref 4.0–10.5)
nRBC: 0 % (ref 0.0–0.2)

## 2021-02-12 LAB — PROTIME-INR
INR: 1 (ref 0.8–1.2)
Prothrombin Time: 12.8 s (ref 11.4–15.2)

## 2021-02-12 LAB — BASIC METABOLIC PANEL WITH GFR
Anion gap: 9 (ref 5–15)
BUN: 16 mg/dL (ref 6–20)
CO2: 26 mmol/L (ref 22–32)
Calcium: 9.8 mg/dL (ref 8.9–10.3)
Chloride: 101 mmol/L (ref 98–111)
Creatinine, Ser: 0.65 mg/dL (ref 0.44–1.00)
GFR, Estimated: >60 mL/min
Glucose, Bld: 116 mg/dL — ABNORMAL HIGH (ref 70–99)
Potassium: 3.7 mmol/L (ref 3.5–5.1)
Sodium: 136 mmol/L (ref 135–145)

## 2021-02-12 LAB — URINALYSIS, ROUTINE W REFLEX MICROSCOPIC
Bilirubin Urine: NEGATIVE
Glucose, UA: NEGATIVE mg/dL
Hgb urine dipstick: NEGATIVE
Ketones, ur: NEGATIVE mg/dL
Leukocytes,Ua: NEGATIVE
Nitrite: NEGATIVE
Protein, ur: NEGATIVE mg/dL
Specific Gravity, Urine: 1.024 (ref 1.005–1.030)
pH: 5 (ref 5.0–8.0)

## 2021-02-12 LAB — APTT: aPTT: 26 seconds (ref 24–36)

## 2021-02-12 LAB — SURGICAL PCR SCREEN
MRSA, PCR: NEGATIVE
Staphylococcus aureus: POSITIVE — AB

## 2021-02-12 LAB — TYPE AND SCREEN
ABO/RH(D): O NEG
Antibody Screen: NEGATIVE

## 2021-02-12 NOTE — Patient Instructions (Signed)
Your procedure is scheduled on: 02/24/21 Report to DAY SURGERY DEPARTMENT LOCATED ON 2ND FLOOR MEDICAL MALL ENTRANCE. To find out your arrival time please call [phone] between 1PM - 3PM on 02/23/21.  Remember: Instructions that are not followed completely may result in serious medical risk, up to and including death, or upon the discretion of your surgeon and anesthesiologist your surgery may need to be rescheduled.     _X__ 1. Do not eat food after midnight the night before your procedure.                 No gum chewing or hard candies. You may drink clear liquids up to 2 hours                 before you are scheduled to arrive for your surgery- DO not drink clear                 liquids within 2 hours of the start of your surgery.                 Clear Liquids include:  water, apple juice without pulp, clear carbohydrate                 drink such as Clearfast or Gatorade, Black Coffee or Tea (Do not add                 anything to coffee or tea). Diabetics water only  __X__2.  On the morning of surgery brush your teeth with toothpaste and water, you                 may rinse your mouth with mouthwash if you wish.  Do not swallow any              toothpaste of mouthwash.     _X__ 3.  No Alcohol for 24 hours before or after surgery.   _X__ 4.  Do Not Smoke or use e-cigarettes For 24 Hours Prior to Your Surgery.                 Do not use any chewable tobacco products for at least 6 hours prior to                 surgery.  ____  5.  Bring all medications with you on the day of surgery if instructed.   __X__  6.  Notify your doctor if there is any change in your medical condition      (cold, fever, infections).     Do not wear jewelry, make-up, hairpins, clips or nail polish. Do not wear lotions, powders, or perfumes.  Do not shave 48 hours prior to surgery. Men may shave face and neck. Do not bring valuables to the hospital.    [facility] is not responsible for any belongings or  valuables.  Contacts, dentures/partials or body piercings may not be worn into surgery. Bring a case for your contacts, glasses or hearing aids, a denture cup will be supplied. Leave your suitcase in the car. After surgery it may be brought to your room. For patients admitted to the hospital, discharge time is determined by your treatment team.   Patients discharged the day of surgery will not be allowed to drive home.   Please read over the following fact sheets that you were given:   MRSA Information, chg soap  __X__ Take these medicines the morning of surgery with A SIP OF WATER:  1. amLODipine (NORVASC) 5 MG tablet  2.   3.   4.  5.  6.  ____ Fleet Enema (as directed)   __X__ Use CHG Soap/SAGE wipes as directed  ____ Use inhalers on the day of surgery  ____ Stop metformin/Janumet/Farxiga 2 days prior to surgery    ____ Take 1/2 of usual insulin dose the night before surgery. No insulin the morning          of surgery.   ____ Stop Blood Thinners Coumadin/Plavix/Xarelto/Pleta/Pradaxa/Eliquis/Effient/Aspirin  on   Or contact your Surgeon, Cardiologist or Medical Doctor regarding  ability to stop your blood thinners  __X__ Stop Anti-inflammatories 7 days before surgery such as Advil, Ibuprofen, Motrin,  BC or Goodies Powder, Naprosyn, Naproxen, Aleve, Aspirin    __X__ Stop all herbal supplements, fish oil or vitamin E until after surgery.  Zinc, Vitamin C, Turmeric,  ____ Bring C-Pap to the hospital.

## 2021-02-18 ENCOUNTER — Inpatient Hospital Stay: Payer: BC Managed Care – PPO | Attending: Oncology

## 2021-02-22 ENCOUNTER — Other Ambulatory Visit
Admission: RE | Admit: 2021-02-22 | Discharge: 2021-02-22 | Disposition: A | Discharge status: Home / Self Care | Payer: BC Managed Care – PPO | Source: Ambulatory Visit | Attending: Neurosurgery | Admitting: Neurosurgery

## 2021-02-22 ENCOUNTER — Other Ambulatory Visit: Payer: Self-pay

## 2021-02-22 DIAGNOSIS — Z01812 Encounter for preprocedural laboratory examination: Secondary | ICD-10-CM | POA: Insufficient documentation

## 2021-02-22 DIAGNOSIS — Z20822 Contact with and (suspected) exposure to covid-19: Secondary | ICD-10-CM | POA: Diagnosis not present

## 2021-02-22 DIAGNOSIS — Z7982 Long term (current) use of aspirin: Secondary | ICD-10-CM | POA: Diagnosis not present

## 2021-02-22 DIAGNOSIS — Z79899 Other long term (current) drug therapy: Secondary | ICD-10-CM | POA: Diagnosis not present

## 2021-02-22 DIAGNOSIS — I10 Essential (primary) hypertension: Secondary | ICD-10-CM | POA: Diagnosis not present

## 2021-02-22 DIAGNOSIS — G959 Disease of spinal cord, unspecified: Secondary | ICD-10-CM | POA: Diagnosis present

## 2021-02-22 DIAGNOSIS — Z96643 Presence of artificial hip joint, bilateral: Secondary | ICD-10-CM | POA: Diagnosis not present

## 2021-02-22 DIAGNOSIS — Z9049 Acquired absence of other specified parts of digestive tract: Secondary | ICD-10-CM | POA: Diagnosis not present

## 2021-02-22 DIAGNOSIS — Z8619 Personal history of other infectious and parasitic diseases: Secondary | ICD-10-CM | POA: Diagnosis not present

## 2021-02-22 DIAGNOSIS — Z6841 Body Mass Index (BMI) 40.0 and over, adult: Secondary | ICD-10-CM | POA: Diagnosis not present

## 2021-02-22 LAB — SARS CORONAVIRUS 2 (TAT 6-24 HRS): SARS Coronavirus 2: NEGATIVE

## 2021-02-24 ENCOUNTER — Ambulatory Visit: Payer: BC Managed Care – PPO | Admitting: Urgent Care

## 2021-02-24 ENCOUNTER — Other Ambulatory Visit: Payer: Self-pay

## 2021-02-24 ENCOUNTER — Encounter: Payer: Self-pay | Admitting: Neurosurgery

## 2021-02-24 ENCOUNTER — Ambulatory Visit
Admission: RE | Admit: 2021-02-24 | Discharge: 2021-02-24 | Disposition: A | Discharge status: Home / Self Care | Payer: BC Managed Care – PPO | Attending: Neurosurgery | Admitting: Neurosurgery

## 2021-02-24 ENCOUNTER — Ambulatory Visit: Payer: BC Managed Care – PPO

## 2021-02-24 ENCOUNTER — Encounter
Admission: RE | Disposition: A | Discharge status: Home / Self Care | Payer: Self-pay | Source: Home / Self Care | Attending: Neurosurgery

## 2021-02-24 DIAGNOSIS — I10 Essential (primary) hypertension: Secondary | ICD-10-CM | POA: Insufficient documentation

## 2021-02-24 DIAGNOSIS — Z96643 Presence of artificial hip joint, bilateral: Secondary | ICD-10-CM | POA: Insufficient documentation

## 2021-02-24 DIAGNOSIS — Z20822 Contact with and (suspected) exposure to covid-19: Secondary | ICD-10-CM | POA: Insufficient documentation

## 2021-02-24 DIAGNOSIS — Z9049 Acquired absence of other specified parts of digestive tract: Secondary | ICD-10-CM | POA: Insufficient documentation

## 2021-02-24 DIAGNOSIS — Z419 Encounter for procedure for purposes other than remedying health state, unspecified: Secondary | ICD-10-CM

## 2021-02-24 DIAGNOSIS — Z6841 Body Mass Index (BMI) 40.0 and over, adult: Secondary | ICD-10-CM | POA: Insufficient documentation

## 2021-02-24 DIAGNOSIS — G959 Disease of spinal cord, unspecified: Secondary | ICD-10-CM

## 2021-02-24 DIAGNOSIS — Z7982 Long term (current) use of aspirin: Secondary | ICD-10-CM | POA: Insufficient documentation

## 2021-02-24 DIAGNOSIS — Z79899 Other long term (current) drug therapy: Secondary | ICD-10-CM | POA: Insufficient documentation

## 2021-02-24 DIAGNOSIS — Z8619 Personal history of other infectious and parasitic diseases: Secondary | ICD-10-CM | POA: Insufficient documentation

## 2021-02-24 HISTORY — PX: ANTERIOR CERVICAL DECOMP/DISCECTOMY FUSION: SHX1161

## 2021-02-24 SURGERY — ANTERIOR CERVICAL DECOMPRESSION/DISCECTOMY FUSION 2 LEVELS
Anesthesia: General

## 2021-02-24 MED ORDER — FENTANYL CITRATE (PF) 100 MCG/2ML IJ SOLN
25.0000 ug | INTRAMUSCULAR | Status: DC | PRN
  Administered 2021-02-24 (×2): 25 ug via INTRAVENOUS
  Administered 2021-02-24: 50 ug via INTRAVENOUS

## 2021-02-24 MED ORDER — LACTATED RINGERS IV SOLN: INTRAVENOUS | Status: DC

## 2021-02-24 MED ORDER — REMIFENTANIL HCL 1 MG IV SOLR
INTRAVENOUS | Status: AC
  Filled 2021-02-24: qty 1000

## 2021-02-24 MED ORDER — PHENYLEPHRINE HCL-NACL 10-0.9 MG/250ML-% IV SOLN
INTRAVENOUS | Status: DC | PRN
  Administered 2021-02-24: 30 ug/min via INTRAVENOUS

## 2021-02-24 MED ORDER — MIDAZOLAM HCL 2 MG/2ML IJ SOLN
INTRAMUSCULAR | Status: DC | PRN
  Administered 2021-02-24: 1 mg via INTRAVENOUS

## 2021-02-24 MED ORDER — CHLORHEXIDINE GLUCONATE 0.12 % MT SOLN
OROMUCOSAL | Status: AC
  Filled 2021-02-24: qty 15

## 2021-02-24 MED ORDER — ORAL CARE MOUTH RINSE: 15.0000 mL | Freq: Once | OROMUCOSAL | Status: DC

## 2021-02-24 MED ORDER — METHOCARBAMOL 500 MG PO TABS
ORAL_TABLET | ORAL | Status: AC
  Filled 2021-02-24: qty 1

## 2021-02-24 MED ORDER — OXYCODONE HCL 5 MG PO TABS
ORAL_TABLET | ORAL | Status: AC
  Filled 2021-02-24: qty 1

## 2021-02-24 MED ORDER — PROPOFOL 500 MG/50ML IV EMUL
INTRAVENOUS | Status: AC
  Filled 2021-02-24: qty 50

## 2021-02-24 MED ORDER — ONDANSETRON HCL 4 MG/2ML IJ SOLN
INTRAMUSCULAR | Status: DC | PRN
  Administered 2021-02-24: 4 mg via INTRAVENOUS

## 2021-02-24 MED ORDER — PHENYLEPHRINE HCL (PRESSORS) 10 MG/ML IV SOLN
INTRAVENOUS | Status: DC | PRN
  Administered 2021-02-24: 100 ug via INTRAVENOUS
  Administered 2021-02-24 (×2): 150 ug via INTRAVENOUS
  Administered 2021-02-24 (×3): 100 ug via INTRAVENOUS

## 2021-02-24 MED ORDER — DEXAMETHASONE SODIUM PHOSPHATE 10 MG/ML IJ SOLN
INTRAMUSCULAR | Status: DC | PRN
  Administered 2021-02-24: 10 mg via INTRAVENOUS

## 2021-02-24 MED ORDER — OXYCODONE HCL 5 MG PO TABS
5.0000 mg | ORAL_TABLET | Freq: Once | ORAL | Status: AC | PRN
  Administered 2021-02-24: 5 mg via ORAL

## 2021-02-24 MED ORDER — LIDOCAINE HCL (CARDIAC) PF 100 MG/5ML IV SOSY
PREFILLED_SYRINGE | INTRAVENOUS | Status: DC | PRN
  Administered 2021-02-24: 100 mg via INTRAVENOUS

## 2021-02-24 MED ORDER — MIDAZOLAM HCL 2 MG/2ML IJ SOLN
INTRAMUSCULAR | Status: AC
  Filled 2021-02-24: qty 2

## 2021-02-24 MED ORDER — METHOCARBAMOL 500 MG PO TABS: 500.0000 mg | ORAL_TABLET | Freq: Four times a day (QID) | ORAL | 0 refills | Status: DC | PRN

## 2021-02-24 MED ORDER — REMIFENTANIL HCL 1 MG IV SOLR
INTRAVENOUS | Status: DC | PRN
Start: 2021-02-24 — End: 2021-02-24
  Administered 2021-02-24: .15 ug/kg/min via INTRAVENOUS

## 2021-02-24 MED ORDER — OXYCODONE HCL 5 MG/5ML PO SOLN: 5.0000 mg | Freq: Once | ORAL | Status: AC | PRN

## 2021-02-24 MED ORDER — PROPOFOL 10 MG/ML IV BOLUS
INTRAVENOUS | Status: AC
  Filled 2021-02-24: qty 40

## 2021-02-24 MED ORDER — HYDROMORPHONE HCL 1 MG/ML IJ SOLN
INTRAMUSCULAR | Status: AC
  Administered 2021-02-24: 0.25 mg via INTRAVENOUS
  Filled 2021-02-24: qty 1

## 2021-02-24 MED ORDER — OXYCODONE HCL 5 MG PO TABS: 5.0000 mg | ORAL_TABLET | Freq: Four times a day (QID) | ORAL | 0 refills | Status: AC | PRN

## 2021-02-24 MED ORDER — THROMBIN 5000 UNITS EX SOLR
CUTANEOUS | Status: DC | PRN
  Administered 2021-02-24: 5000 [IU] via TOPICAL

## 2021-02-24 MED ORDER — SUCCINYLCHOLINE CHLORIDE 20 MG/ML IJ SOLN
INTRAMUSCULAR | Status: DC | PRN
  Administered 2021-02-24: 100 mg via INTRAVENOUS

## 2021-02-24 MED ORDER — FAMOTIDINE 20 MG PO TABS
ORAL_TABLET | ORAL | Status: AC
  Administered 2021-02-24: 20 mg via ORAL
  Filled 2021-02-24: qty 1

## 2021-02-24 MED ORDER — HYDROMORPHONE HCL 1 MG/ML IJ SOLN
0.2500 mg | INTRAMUSCULAR | Status: AC
  Administered 2021-02-24 (×3): 0.25 mg via INTRAVENOUS

## 2021-02-24 MED ORDER — DEXTROSE 5 % IV SOLN
3.0000 g | INTRAVENOUS | Status: AC
  Administered 2021-02-24: 3 g via INTRAVENOUS
  Filled 2021-02-24: qty 3

## 2021-02-24 MED ORDER — FENTANYL CITRATE (PF) 100 MCG/2ML IJ SOLN
INTRAMUSCULAR | Status: DC | PRN
  Administered 2021-02-24: 100 ug via INTRAVENOUS

## 2021-02-24 MED ORDER — FENTANYL CITRATE (PF) 100 MCG/2ML IJ SOLN
INTRAMUSCULAR | Status: AC
  Filled 2021-02-24: qty 2

## 2021-02-24 MED ORDER — CHLORHEXIDINE GLUCONATE 0.12 % MT SOLN: 15.0000 mL | Freq: Once | OROMUCOSAL | Status: DC

## 2021-02-24 MED ORDER — FAMOTIDINE 20 MG PO TABS: 20.0000 mg | ORAL_TABLET | Freq: Once | ORAL | Status: AC

## 2021-02-24 MED ORDER — BUPIVACAINE-EPINEPHRINE (PF) 0.5% -1:200000 IJ SOLN
INTRAMUSCULAR | Status: DC | PRN
  Administered 2021-02-24: 9 mL

## 2021-02-24 MED ORDER — PROPOFOL 500 MG/50ML IV EMUL
INTRAVENOUS | Status: DC | PRN
  Administered 2021-02-24: 150 ug/kg/min via INTRAVENOUS

## 2021-02-24 MED ORDER — PROMETHAZINE HCL 25 MG/ML IJ SOLN: 6.2500 mg | INTRAMUSCULAR | Status: DC | PRN

## 2021-02-24 MED ORDER — PROPOFOL 10 MG/ML IV BOLUS
INTRAVENOUS | Status: DC | PRN
  Administered 2021-02-24: 200 mg via INTRAVENOUS

## 2021-02-24 MED ORDER — METHOCARBAMOL 500 MG PO TABS
500.0000 mg | ORAL_TABLET | Freq: Once | ORAL | Status: AC
  Administered 2021-02-24: 500 mg via ORAL

## 2021-02-24 SURGICAL SUPPLY — 61 items
ADH SKN CLS APL DERMABOND .7 (GAUZE/BANDAGES/DRESSINGS) ×1
AGENT HMST MTR 8 SURGIFLO (HEMOSTASIS) ×1
APL PRP STRL LF DISP 70% ISPRP (MISCELLANEOUS) ×2
BASKET BONE COLLECTION (BASKET) IMPLANT
BULB RESERV EVAC DRAIN JP 100C (MISCELLANEOUS) IMPLANT
BUR NEURO DRILL SOFT 3.0X3.8M (BURR) ×2 IMPLANT
CHLORAPREP W/TINT 26 (MISCELLANEOUS) ×4 IMPLANT
COUNTER NEEDLE 20/40 LG (NEEDLE) ×2 IMPLANT
COVER WAND RF STERILE (DRAPES) ×2 IMPLANT
CUP MEDICINE 2OZ PLAST GRAD ST (MISCELLANEOUS) ×2 IMPLANT
DERMABOND ADVANCED (GAUZE/BANDAGES/DRESSINGS) ×1
DERMABOND ADVANCED .7 DNX12 (GAUZE/BANDAGES/DRESSINGS) ×1 IMPLANT
DRAIN CHANNEL JP 10F RND 20C F (MISCELLANEOUS) IMPLANT
DRAPE C ARM PK CFD 31 SPINE (DRAPES) ×2 IMPLANT
DRAPE LAPAROTOMY 77X122 PED (DRAPES) ×2 IMPLANT
DRAPE MICROSCOPE SPINE 48X150 (DRAPES) ×2 IMPLANT
DRAPE SURG 17X11 SM STRL (DRAPES) ×8 IMPLANT
ELECT CAUTERY BLADE TIP 2.5 (TIP) ×2
ELECT REM PT RETURN 9FT ADLT (ELECTROSURGICAL) ×2
ELECTRODE CAUTERY BLDE TIP 2.5 (TIP) ×1 IMPLANT
ELECTRODE REM PT RTRN 9FT ADLT (ELECTROSURGICAL) ×1 IMPLANT
FEE INTRAOP MONITOR IMPULS NCS (MISCELLANEOUS) ×1 IMPLANT
GLOVE SURG SYN 7.0 (GLOVE) IMPLANT
GLOVE SURG SYN 8.5  E (GLOVE) ×3
GLOVE SURG SYN 8.5 E (GLOVE) ×3 IMPLANT
GLOVE SURG UNDER POLY LF SZ7 (GLOVE) IMPLANT
GOWN SRG XL LVL 3 NONREINFORCE (GOWNS) ×1 IMPLANT
GOWN STRL NON-REIN TWL XL LVL3 (GOWNS) ×2
GOWN STRL REUS W/ TWL XL LVL3 (GOWN DISPOSABLE) ×1 IMPLANT
GOWN STRL REUS W/TWL XL LVL3 (GOWN DISPOSABLE) ×2
GRADUATE 1200CC STRL 31836 (MISCELLANEOUS) ×2 IMPLANT
INTRAOP MONITOR FEE IMPULS NCS (MISCELLANEOUS) ×1
INTRAOP MONITOR FEE IMPULSE (MISCELLANEOUS) ×1
KIT TURNOVER KIT A (KITS) ×2 IMPLANT
MANIFOLD NEPTUNE II (INSTRUMENTS) ×2 IMPLANT
MARKER SKIN DUAL TIP RULER LAB (MISCELLANEOUS) ×4 IMPLANT
NDL SAFETY ECLIPSE 18X1.5 (NEEDLE) ×1 IMPLANT
NEEDLE HYPO 18GX1.5 SHARP (NEEDLE) ×2
NEEDLE HYPO 22GX1.5 SAFETY (NEEDLE) ×2 IMPLANT
NS IRRIG 1000ML POUR BTL (IV SOLUTION) ×2 IMPLANT
PACK LAMINECTOMY NEURO (CUSTOM PROCEDURE TRAY) ×2 IMPLANT
PAD ARMBOARD 7.5X6 YLW CONV (MISCELLANEOUS) ×2 IMPLANT
PIN CASPAR 14 (PIN) ×1 IMPLANT
PIN CASPAR 14MM (PIN) ×2
PLATE ANT CERV XTEND 2 LV 32 (Plate) ×2 IMPLANT
PUTTY DBX 1CC (Putty) ×2 IMPLANT
PUTTY DBX 1CC DEPUY (Putty) ×1 IMPLANT
SCREW VAR 4.2 XD SELF DRILL 16 (Screw) ×10 IMPLANT
SCREW XTEND SELF DRILL 4.6X16 (Screw) ×2 IMPLANT
SPACER C HEDRON 12X14 7M 7D (Spacer) ×4 IMPLANT
SPOGE SURGIFLO 8M (HEMOSTASIS) ×1
SPONGE KITTNER 5P (MISCELLANEOUS) ×2 IMPLANT
SPONGE SURGIFLO 8M (HEMOSTASIS) ×1 IMPLANT
STAPLER SKIN PROX 35W (STAPLE) IMPLANT
SUT V-LOC 90 ABS DVC 3-0 CL (SUTURE) ×2 IMPLANT
SUT VIC AB 3-0 SH 8-18 (SUTURE) ×4 IMPLANT
SYR 30ML LL (SYRINGE) ×2 IMPLANT
TAPE CLOTH 3X10 WHT NS LF (GAUZE/BANDAGES/DRESSINGS) ×2 IMPLANT
TOWEL OR 17X26 4PK STRL BLUE (TOWEL DISPOSABLE) ×6 IMPLANT
TRAY FOLEY MTR SLVR 16FR STAT (SET/KITS/TRAYS/PACK) IMPLANT
TUBING CONNECTING 10 (TUBING) ×2 IMPLANT

## 2021-02-24 NOTE — Transfer of Care (Signed)
Immediate Anesthesia Transfer of Care Note  Patient: Tamara Dodson  Procedure(s) Performed: ANTERIOR CERVICAL DECOMPRESSION/DISCECTOMY FUSION 2 LEVELS C3-5 (N/A )  Patient Location: PACU  Anesthesia Type:General  Level of Consciousness: drowsy and patient cooperative  Airway & Oxygen Therapy: Patient Spontanous Breathing and Patient connected to face mask  Post-op Assessment: Report given to RN and Post -op Vital signs reviewed and stable  Post vital signs: stable  Last Vitals:  Vitals Value Taken Time  BP 140/74 02/24/21 0940  Temp 36.2 C 02/24/21 0938  Pulse 80 02/24/21 0943  Resp 17 02/24/21 0943  SpO2 98 % 02/24/21 0943  Vitals shown include unvalidated device data.  Last Pain:  Vitals:   02/24/21 0606  TempSrc: Temporal  PainSc: 8       Patients Stated Pain Goal: 3 (02/24/21 0606)  Complications: No complications documented.

## 2021-02-24 NOTE — Anesthesia Procedure Notes (Signed)
Procedure Name: Intubation Date/Time: 02/24/2021 7:28 AM Performed by: Allena Katz, Ruben Mahler, CRNA Pre-anesthesia Checklist: Patient identified, Patient being monitored, Timeout performed, Emergency Drugs available and Suction available Patient Re-evaluated:Patient Re-evaluated prior to induction Oxygen Delivery Method: Circle system utilized Preoxygenation: Pre-oxygenation with 100% oxygen Induction Type: IV induction Ventilation: Mask ventilation without difficulty Laryngoscope Size: 3 and McGraph Grade View: Grade II Tube type: Oral Tube size: 7.0 mm Number of attempts: 1 Airway Equipment and Method: Stylet Placement Confirmation: ETT inserted through vocal cords under direct vision,  positive ETCO2 and breath sounds checked- equal and bilateral Secured at: 21 cm Tube secured with: Tape Dental Injury: Teeth and Oropharynx as per pre-operative assessment  Difficulty Due To: Difficulty was anticipated

## 2021-02-24 NOTE — Op Note (Signed)
Indications: Ms. Meckler is a 60 yo female cervical myelopathy causing right arm weakness.  She had objective worsening neurologic deficits prompting surgical intervention.  Findings: severe stenosis   Preoperative Diagnosis: Cervical myelopathy G95.9, R arm weakness R29.898 Postoperative Diagnosis: same   EBL: 25 ml IVF: 600 ml Drains: none Disposition: Extubated and Stable to PACU Complications: none  No foley catheter was placed.   Preoperative Note:   Risks of surgery discussed include: infection, bleeding, stroke, coma, death, paralysis, CSF leak, nerve/spinal cord injury, numbness, tingling, weakness, complex regional pain syndrome, recurrent stenosis and/or disc herniation, vascular injury, development of instability, neck/back pain, need for further surgery, persistent symptoms, development of deformity, and the risks of anesthesia. The patient understood these risks and agreed to proceed.  Operative Note:   Procedure:  1) Anterior cervical diskectomy and fusion at C3/4 and C4/5 2) Anterior cervical instrumentation at C3 - 5 using Globus Xtend 3) Placement of biomechanical devices at C3/4 and C4/5  4) Use of operative microscope 5) Use of flouroscopy   Procedure: After obtaining informed consent, the patient taken to the operating room, placed in supine position, general anesthesia induced.  The patient had a small shoulder roll placed behind their shoulders.  The patient received preop antibiotics and IV Decadron.  The patient had a neck incision outlined, was prepped and draped in usual sterile fashion. The incision was injected with local anesthetic.   An incision was opened, dissection taken down medial to the carotid artery and jugular vein, lateral to the trachea and esophagus.  The prevertebral fascia identified and a localizing x-ray demonstrated the correct level.  The longus colli were dissected laterally, and self-retaining retractors placed to open the operative field.  The microscope was then brought into the field.  With this complete, distractor pins were placed in the vertebral bodies of C3 and C5. The distractor was placed, and the annuli at C3/4 and C4/5 were opened using a bovie.  Curettes and pituitary rongeurs used to remove the majority of disk, then the drill was used to remove the posterior osteophyte and begin the foraminotomies. The nerve hook was used to elevate the posterior longitudinal ligament, which was then removed with Kerrison rongeurs. The microblunt nerve hook could be passed out the foramen bilateral at each level.   Meticulous hemostasis obtained.  A biomechanical device (Globus Hedron 7 mm height x 14 mm width by 12 mm depth) was placed at C3/4. A second biomechanical device (Globus Hedron 7 mm height x 14 mm width by 12 mm depth) was placed at C4/5. Each device had been filled with allograft for aid in arthrodesis.  The caspar distractor was removed, and bone wax used for hemostasis. A 30 mm Globus Xtend plate was chosen.  Two screws placed in each vertebral body, respectively making sure the screws were behind the locking mechanism.  Final AP and lateral radiographs were taken.   With everything in good position, the wound was irrigated copiously with bacitracin-containing solution and meticulous hemostasis obtained.  Wound was closed in 2 layers using interrupted inverted 3-0 Vicryl sutures in the platysma and 3-0 monocryl on the dermis.  The wound was dressed with dermabond, the head of bed at 30 degrees, taken to recovery room in stable condition.  No new postop neurological deficits were identified.  Sponge and pattie counts were correct at the end of the procedure.     I performed the entire procedure with the assistance of Done Drinkwater PA as an Designer, television/film set.  Venetia Night MD

## 2021-02-24 NOTE — Progress Notes (Signed)
Pt arrived via stretcher from PACU, pt c/o severe pain in neck. Dr Orland Penman notified and dilaudid ordered and given, with relief. Pt stated that her pain is 1/10. Pt tolerating po fluids and food well denies N/V. D/C instructions given to pt and husband verbalized understanding. Pt ambulated around SDS unit without difficulty, tolerated well. Bilateral hand strength equal and strong, bilateral leg strength equal and strong. Ambulated to BR voided without difficulty. Pt transferred to xray via wheelchair accompanied by nurse tech and husband.

## 2021-02-24 NOTE — Discharge Instructions (Addendum)
AMBULATORY SURGERY  DISCHARGE INSTRUCTIONS   1) The drugs that you were given will stay in your system until tomorrow so for the next 24 hours you should not:  A) Drive an automobile B) Make any legal decisions C) Drink any alcoholic beverage   2) You may resume regular meals tomorrow.  Today it is better to start with liquids and gradually work up to solid foods.  You may eat anything you prefer, but it is better to start with liquids, then soup and crackers, and gradually work up to solid foods.   3) Please notify your doctor immediately if you have any unusual bleeding, trouble breathing, redness and pain at the surgery site, drainage, fever, or pain not relieved by medication.    4) Additional Instructions:        Please contact your physician with any problems or Same Day Surgery at [phone], Monday through Friday 6 am to 4 pm, or [place] at [facility] number at [phone]. Your surgeon has performed an operation on your cervical spine (neck) to relieve pressure on the spinal cord and/or nerves. This involved making an incision in the front of your neck and removing one or more of the discs that support your spine. Next, a small piece of bone, a titanium plate, and screws were used to fuse two or more of the vertebrae (bones) together.  The following are instructions to help in your recovery once you have been discharged from the hospital. Even if you feel well, it is important that you follow these activity guidelines. If you do not let your neck heal properly from the surgery, you can increase the chance of return of your symptoms and other complications.  * Do not take anti-inflammatory medications for 3 months after surgery (naproxen [Aleve], ibuprofen [Advil, Motrin], celecoxib [Celebrex], etc.). These medications can prevent your bones from healing properly.  Activity    No bending, lifting, or twisting ("BLT"). Avoid lifting objects heavier than 10 pounds  (gallon milk jug).  Where possible, avoid household activities that involve lifting, bending, reaching, pushing, or pulling such as laundry, vacuuming, grocery shopping, and childcare. Try to arrange for help from friends and family for these activities while your back heals.  Increase physical activity slowly as tolerated.  Taking short walks is encouraged, but avoid strenuous exercise. Do not jog, run, bicycle, lift weights, or participate in any other exercises unless specifically allowed by your doctor.  Talk to your doctor before resuming sexual activity.  You should not drive until cleared by your doctor.  Until released by your doctor, you should not return to work or school.  You should rest at home and let your body heal.   You may shower three days after your surgery.  After showering, lightly dab your incision dry. Do not take a tub bath or go swimming until approved by your doctor at your follow-up appointment.  If your doctor ordered a cervical collar (neck brace) for you, you should wear it whenever you are out of bed. You may remove it when lying down or sleeping, but you should wear it at all other times. Not all neck surgeries require a cervical collar.  If you smoke, we strongly recommend that you quit.  Smoking has been proven to interfere with normal bone healing and will dramatically reduce the success rate of your surgery. Please contact [facility] (800-QUIT-NOW) and use the resources at www.[facility].com for assistance in stopping smoking.  Surgical Incision   If you have  a dressing on your incision, you may remove it two days after your surgery. Keep your incision area clean and dry.  If you have staples or stitches on your incision, you should have a follow up scheduled for removal. If you do not have staples or stitches, you will have steri-strips (small pieces of surgical tape) or Dermabond glue. The steri-strips/glue should begin to peel away within about a week (it  is fine if the steri-strips fall off before then). If the strips are still in place one week after your surgery, you may gently remove them.  Diet           You may return to your usual diet. However, you may experience discomfort when swallowing in the first month after your surgery. This is normal. You may find that softer foods are more comfortable for you to swallow. Be sure to stay hydrated.  When to Contact us  You may experience pain in your neck and/or pain between your shoulder blades. This is normal and should improve in the next few weeks with the help of pain medication, muscle relaxers, and rest. Some patients report that a warm compress on the back of the neck or between the shoulder blades helps.  However, should you experience any of the following, contact us immediately: . New numbness or weakness . Pain that is progressively getting worse, and is not relieved by your pain medication, muscle relaxers, rest, and warm compresses . Bleeding, redness, swelling, pain, or drainage from surgical incision . Chills or flu-like symptoms . Fever greater than 101.0 F (38.3 C) . Inability to eat, drink fluids, or take medications . Problems with bowel or bladder functions . Difficulty breathing or shortness of breath . Warmth, tenderness, or swelling in your calf Contact Information . During office hours (Monday-Friday 9 am to 5 pm), please call your physician at [phone] and ask for Sharlot Gowda . After hours and weekends, please call [phone] and speak with the answering service, who will contact the doctor on call.  If that fails, call the [facility] Operator at [phone] and ask for the Neurosurgery Resident On Call  . For a life-threatening emergency, call 911    AMBULATORY SURGERY  DISCHARGE INSTRUCTIONS   5) The drugs that you were given will stay in your system until tomorrow so for the next 24 hours you should not:  D) Drive an automobile E) Make any legal  decisions F) Drink any alcoholic beverage   6) You may resume regular meals tomorrow.  Today it is better to start with liquids and gradually work up to solid foods.  You may eat anything you prefer, but it is better to start with liquids, then soup and crackers, and gradually work up to solid foods.   7) Please notify your doctor immediately if you have any unusual bleeding, trouble breathing, redness and pain at the surgery site, drainage, fever, or pain not relieved by medication.    8) Additional Instructions:        Please contact your physician with any problems or Same Day Surgery at [phone], Monday through Friday 6 am to 4 pm, or [place] at [facility] number at [phone].

## 2021-02-24 NOTE — Anesthesia Preprocedure Evaluation (Addendum)
Anesthesia Evaluation  Patient identified by MRN, date of birth, ID band Patient awake    Reviewed: Allergy & Precautions, H&P , NPO status , Patient's Chart, lab work & pertinent test results  History of Anesthesia Complications Negative for: history of anesthetic complications  Airway Mallampati: III  TM Distance: >3 FB Neck ROM: limited   Comment: Severely limited neck extension Dental  (+) Teeth Intact   Pulmonary neg pulmonary ROS, neg sleep apnea, neg COPD,    breath sounds clear to auscultation       Cardiovascular hypertension, (-) angina(-) Past MI and (-) Cardiac Stents (-) dysrhythmias  Rhythm:regular Rate:Normal     Neuro/Psych negative neurological ROS  negative psych ROS   GI/Hepatic negative GI ROS, (+) Cirrhosis       , NAFLD   Endo/Other  Morbid obesity  Renal/GU      Musculoskeletal   Abdominal   Peds  Hematology negative hematology ROS (+)   Anesthesia Other Findings Past Medical History: No date: Anemia No date: Arthritis     Comment:  osteoarthrosis No date: Cirrhosis (HCC)     Comment:  seeing Dr Cathie Hoops No date: Fatty liver No date: Hypertension 07/18/2019: Iron overload No date: Lumbago No date: Lymph edema     Comment:  lower extremities No date: Morbid obesity (HCC)  Past Surgical History: No date: CHOLECYSTECTOMY 01/25/2021: ESOPHAGOGASTRODUODENOSCOPY; N/A     Comment:  Procedure: ESOPHAGOGASTRODUODENOSCOPY (EGD);  Surgeon:               Toledo, Boykin Nearing, MD;  Location: [facility] ENDOSCOPY;                Service: Gastroenterology;  Laterality: N/A;  C-19 test               on 01/22/2021 Requesting to be the last case due to her               transportation No date: JOINT REPLACEMENT; Left     Comment:  knee, hip No date: JOINT REPLACEMENT; Bilateral     Comment:  knee 09/21/2015: KNEE ARTHROPLASTY; Right     Comment:  Procedure: COMPUTER ASSISTED TOTAL KNEE ARTHROPLASTY;                 Surgeon: Donato Heinz, MD;  Location: [facility] ORS;                Service: Orthopedics;  Laterality: Right; No date: TONSILLECTOMY 11/17/2017: TOTAL HIP ARTHROPLASTY; Right     Comment:  Procedure: TOTAL HIP ARTHROPLASTY;  Surgeon: Donato Heinz, MD;  Location: [facility] ORS;  Service: Orthopedics;               Laterality: Right;  BMI    Body Mass Index: 46.58 kg/m      Reproductive/Obstetrics negative OB ROS                            Anesthesia Physical Anesthesia Plan  ASA: III  Anesthesia Plan: General ETT   Post-op Pain Management:    Induction:   PONV Risk Score and Plan: Ondansetron, Dexamethasone, Midazolam, Treatment may vary due to age or medical condition, TIVA and Propofol infusion  Airway Management Planned: Video Laryngoscope Planned  Additional Equipment:   Intra-op Plan:   Post-operative Plan:   Informed Consent: I have reviewed the patients History and Physical,  chart, labs and discussed the procedure including the risks, benefits and alternatives for the proposed anesthesia with the patient or authorized representative who has indicated his/her understanding and acceptance.     Dental Advisory Given  Plan Discussed with: Anesthesiologist, CRNA and Surgeon  Anesthesia Plan Comments:        Anesthesia Quick Evaluation

## 2021-02-24 NOTE — Discharge Summary (Signed)
Physician Discharge Summary  Patient ID: Tamara Dodson MRN: [id number] DOB/AGE: 60-Mar-1962 60 y.o.  Admit date: 02/24/2021 Discharge date: 02/24/2021  Admission Diagnoses: cervical myelopathy G95.9, R arm weakness R29.898  Discharge Diagnoses:  Active Problems:   * No active hospital problems. *   Discharged Condition: good  Hospital Course:  Tamara Dodson was admitted to the operating room for cervical spine surgery for myelopathy.  She tolerated the procedure well.  After observation and stabilization, she was felt to be stable for discharge.  Consults: None  Significant Diagnostic Studies: radiology: X-Ray: good placement of instrumentation  Treatments: surgery: ACDF C3-5   Discharge Exam: Blood pressure 140/74, pulse 80, temperature (!) 97.2 F (36.2 C), resp. rate 17, height 5\' 11"  (1.803 m), weight (!) 151.5 kg, SpO2 98 %. General appearance: alert and cooperative   Doing well and moving all extremities Neck soft and flat Incision c/d/i  Disposition: Discharge disposition: 01-Home or Self Care        Allergies as of 02/24/2021      Reactions   Gluten Meal Diarrhea   bloating      Medication List    TAKE these medications   acetaminophen 500 MG tablet Commonly known as: TYLENOL Take 1,000 mg by mouth every 6 (six) hours as needed for moderate pain.   amLODipine 5 MG tablet Commonly known as: NORVASC Take 5 mg by mouth every morning.   aspirin EC 81 MG tablet Take 81 mg daily by mouth.   furosemide 20 MG tablet Commonly known as: LASIX Take 40 mg by mouth daily.   Magnesium 400 MG Tabs Take 400 mg by mouth 2 (two) times daily.   methocarbamol 500 MG tablet Commonly known as: Robaxin Take 1-2 tablets (500-1,000 mg total) by mouth every 6 (six) hours as needed for muscle spasms.   oxyCODONE 5 MG immediate release tablet Commonly known as: Roxicodone Take 1 tablet (5 mg total) by mouth every 6 (six) hours as needed for up to 5 days (pain).    Potassium 99 MG Tabs Take 99 mg by mouth 2 (two) times daily.   Turmeric 500 MG Caps Take 1,000 mg by mouth daily.   vitamin C 500 MG tablet Commonly known as: ASCORBIC ACID Take 1,000 mg by mouth daily.   Vitamin D 50 MCG (2000 UT) tablet Take 4,000 Units by mouth daily.   zinc gluconate 50 MG tablet Take 100 mg by mouth daily.       Follow-up Information    04/26/2021, MD In 2 weeks.   Specialty: Neurosurgery Why: already scheduled Contact information: [address] [place] [phone]               Signed: [phone] 02/24/2021, 9:53 AM

## 2021-02-24 NOTE — Progress Notes (Signed)
PHARMACY -  BRIEF ANTIBIOTIC NOTE   Pharmacy has received consult(s) for Cefazolin from an OR provider.  The patient's profile has been reviewed for ht/wt/allergies/indication/available labs.    One time order(s) placed for Cefazolin 3 gm based on pt wt: 151.5 kg  Further antibiotics/pharmacy consults should be ordered by admitting physician if indicated.                       Otelia Sergeant, PharmD, [facility] 02/24/2021 1:53 AM

## 2021-02-24 NOTE — Anesthesia Postprocedure Evaluation (Signed)
Anesthesia Post Note  Patient: Tamara Dodson  Procedure(s) Performed: ANTERIOR CERVICAL DECOMPRESSION/DISCECTOMY FUSION 2 LEVELS C3-5 (N/A )  Patient location during evaluation: PACU Anesthesia Type: General Level of consciousness: awake and alert Pain management: pain level controlled Vital Signs Assessment: post-procedure vital signs reviewed and stable Respiratory status: spontaneous breathing, nonlabored ventilation and respiratory function stable Cardiovascular status: blood pressure returned to baseline and stable Postop Assessment: no apparent nausea or vomiting Anesthetic complications: no   No complications documented.   Last Vitals:  Vitals:   02/24/21 1205 02/24/21 1315  BP: (!) 96/38 136/66  Pulse: 93 96  Resp: 16 20  Temp:    SpO2: 93% 96%    Last Pain:  Vitals:   02/24/21 1205  TempSrc:   PainSc: 1                  Karleen Hampshire

## 2021-02-24 NOTE — H&P (Signed)
History of Present Illness: 02/24/2021 Tamara Dodson presents today with continued weakness.    01/14/2021 Tamara Dodson is here today with a chief complaint of severe neck pain that radiates into both arms and to the hands and cervical myelopathy. The left hand is numb and tingling. No balance issues. Some trouble dropping stuff and gripping things in her right hand. Right arm is weak.  She has noticed worsening of her condition over time. It began approximately 3 months ago. She reports some neck pain as well that is bad as 10 out of 10. She has some pain into her shoulder blades. Twisting and turning, bending, standing, sitting all make her symptoms worse. Nothing really helps.  Conservative measures:  Physical therapy: has not participated in Multimodal medical therapy including regular antiinflammatories: Prednisone, tylenol, aleve Injections: has not had any epidural steroid injection  Past Surgery: none  Tamara Dodson has no symptoms of cervical myelopathy.  The symptoms are causing a significant impact on the patient's life.   Review of Systems:  A 10 point review of systems is negative, except for the pertinent positives and negatives detailed in the HPI.  Past Medical History: Past Medical History:  Diagnosis Date  . Enthesopathy of hip  . Hypertension  . Lumbago  . Lymphedema  Bilateral lower extremities  . Morbid obesity (CMS-HCC)  . Osteoarthrosis, unspecified whether generalized or localized, lower leg  . Varicella   Past Surgical History: Past Surgical History:  Procedure Laterality Date  . CHOLECYSTECTOMY 1983  . COLONOSCOPY 08/22/2013  Int/Ext Hemorrhoids: CBF 08/2023: (Updated CBF to reflect 10 years) Recall ltr mailed 06/11/18 (kj)  . KNEE ARTHROSCOPY Right 09/14/11  partial medial and lateral meniscectomies, chondroplasty  . Left total hip arthroplasty 02/14/2011  . Left total knee arthroplasty 02/18/13  . Right total hip arthroplasty 11/17/2017  Dr Ernest Pine  .  Right total knee arthroplasty using computer assisted navigation 09/21/2015  Dr.Hooten  . Tdap 07/2009   Allergies  Allergen Reactions  . Gluten Meal Diarrhea    bloating   Current Meds  Medication Sig  . acetaminophen (TYLENOL) 500 MG tablet Take 1,000 mg by mouth every 6 (six) hours as needed for moderate pain.  Marland Kitchen amLODipine (NORVASC) 5 MG tablet Take 5 mg by mouth every morning.  Marland Kitchen aspirin EC 81 MG tablet Take 81 mg daily by mouth.  . Cholecalciferol (VITAMIN D) 50 MCG (2000 UT) tablet Take 4,000 Units by mouth daily.  . furosemide (LASIX) 20 MG tablet Take 40 mg by mouth daily.  . Magnesium 400 MG TABS Take 400 mg by mouth 2 (two) times daily.  . Potassium 99 MG TABS Take 99 mg by mouth 2 (two) times daily.  . Turmeric 500 MG CAPS Take 1,000 mg by mouth daily.  . vitamin C (ASCORBIC ACID) 500 MG tablet Take 1,000 mg by mouth daily.  Marland Kitchen zinc gluconate 50 MG tablet Take 100 mg by mouth daily.    Social History: Social History   Tobacco Use  . Smoking status: Never Smoker  . Smokeless tobacco: Never Used  Substance Use Topics  . Alcohol use: No  Alcohol/week: 0.0 standard drinks  . Drug use: No   Family Medical History: Family History  Problem Relation Age of Onset  . Diabetes type II Mother  . Breast cancer Mother  . High blood pressure (Hypertension) Mother  . Other Mother  Renal failure  . High blood pressure (Hypertension) Father  . Osteoarthritis Father   Physical Examination:  Vitals:   02/24/21 0606  BP: (!) 149/79  Pulse: 94  Resp: 16  Temp: 98.3 F (36.8 C)  SpO2: 98%   Heart sounds normal no MRG. Chest Clear to Auscultation Bilaterally.   General: Patient is well developed, well nourished, calm, collected, and in no apparent distress. Attention to examination is appropriate.  Psychiatric: Patient is non-anxious.  Head: Pupils equal, round, and reactive to light.  ENT: Oral mucosa appears well hydrated.  Neck: Supple. Full range of  motion.  Respiratory: Patient is breathing without any difficulty.  Extremities: No edema.  Vascular: Palpable dorsal pedal pulses.  Skin: On exposed skin, there are no abnormal skin lesions.  NEUROLOGICAL:   Awake, alert, oriented to person, place, and time. Speech is clear and fluent. Fund of knowledge is appropriate.   Cranial Nerves: Pupils equal round and reactive to light. Facial tone is symmetric. Facial sensation is symmetric. Shoulder shrug is symmetric. Tongue protrusion is midline. There is no pronator drift.  ROM of spine: diminished.  Strength: Side Biceps Triceps Deltoid Interossei Grip Wrist Ext. Wrist Flex.  R 5 5 4+ 4- 4- 4+ 5  L 5 5 5 5 5 5 5    Side Iliopsoas Quads Hamstring PF DF EHL  R 5 5 5 5 5 5   L 5 5 5 5 5 5    Reflexes are 2+ and symmetric at the biceps, triceps, brachioradialis, patella and achilles. Hoffman's is present on R. Clonus is not present. Toes are down-going.  Bilateral upper and lower extremity sensation is symmetric to light touch.  Gait is wide-based. Moderate difficulty with tandem gait.  No evidence of dysmetria noted.  Medical Decision Making  Imaging: MRI C spine 12/26/2020 IMPRESSION:  1. Multilevel cervical spondylosis with resultant moderate to severe  diffuse spinal stenosis at C3-4 through C6-7, most pronounced at  C4-5.  2. Multifactorial degenerative changes with resultant mild to  moderate bilateral C4 through C7 foraminal stenosis as above.   Electronically Signed  By: M.D.  On: 12/27/2020 03:54  I have personally reviewed the images and agree with the above interpretation.  Assessment and Plan: Tamara Dodson is a pleasant 60 y.o. female with cervical myelopathy causing right arm weakness. She has substantial compression at C3-4 and C4-5.  She has mild stenosis at C5-6 and C6-7. Based on her imaging findings and symptoms, I do not think that conservative management is appropriate given the level  of her weakness.   We will proceed with with C3-5 anterior cervical discectomy and fusion for weakness from cervical myelopathy.  02/24/2021 MD, [facility] Department of Neurosurgery

## 2021-02-25 ENCOUNTER — Encounter: Payer: Self-pay | Admitting: Neurosurgery

## 2021-03-15 ENCOUNTER — Inpatient Hospital Stay: Payer: BC Managed Care – PPO | Attending: Oncology

## 2021-03-15 DIAGNOSIS — K7460 Unspecified cirrhosis of liver: Secondary | ICD-10-CM | POA: Diagnosis not present

## 2021-03-15 DIAGNOSIS — K760 Fatty (change of) liver, not elsewhere classified: Secondary | ICD-10-CM

## 2021-03-15 LAB — CBC WITH DIFFERENTIAL/PLATELET
Abs Immature Granulocytes: 0.04 10*3/uL (ref 0.00–0.07)
Basophils Absolute: 0.1 10*3/uL (ref 0.0–0.1)
Basophils Relative: 1 %
Eosinophils Absolute: 0.1 10*3/uL (ref 0.0–0.5)
Eosinophils Relative: 2 %
HCT: 46.4 % — ABNORMAL HIGH (ref 36.0–46.0)
Hemoglobin: 15.9 g/dL — ABNORMAL HIGH (ref 12.0–15.0)
Immature Granulocytes: 1 %
Lymphocytes Relative: 17 %
Lymphs Abs: 1.2 10*3/uL (ref 0.7–4.0)
MCH: 31.1 pg (ref 26.0–34.0)
MCHC: 34.3 g/dL (ref 30.0–36.0)
MCV: 90.8 fL (ref 80.0–100.0)
Monocytes Absolute: 0.5 10*3/uL (ref 0.1–1.0)
Monocytes Relative: 7 %
Neutro Abs: 5.5 10*3/uL (ref 1.7–7.7)
Neutrophils Relative %: 72 %
Platelets: 201 10*3/uL (ref 150–400)
RBC: 5.11 MIL/uL (ref 3.87–5.11)
RDW: 11.9 % (ref 11.5–15.5)
WBC: 7.5 10*3/uL (ref 4.0–10.5)
nRBC: 0 % (ref 0.0–0.2)

## 2021-03-15 LAB — HEPATIC FUNCTION PANEL
ALT: 25 U/L (ref 0–44)
AST: 38 U/L (ref 15–41)
Albumin: 4.4 g/dL (ref 3.5–5.0)
Alkaline Phosphatase: 76 U/L (ref 38–126)
Bilirubin, Direct: 0.1 mg/dL (ref 0.0–0.2)
Indirect Bilirubin: 0.5 mg/dL (ref 0.3–0.9)
Total Bilirubin: 0.6 mg/dL (ref 0.3–1.2)
Total Protein: 7.8 g/dL (ref 6.5–8.1)

## 2021-03-15 LAB — IRON AND TIBC
Iron: 102 ug/dL (ref 28–170)
Saturation Ratios: 38 % — ABNORMAL HIGH (ref 10.4–31.8)
TIBC: 269 ug/dL (ref 250–450)
UIBC: 167 ug/dL

## 2021-03-15 LAB — FERRITIN: Ferritin: 2278 ng/mL — ABNORMAL HIGH (ref 11–307)

## 2021-03-17 ENCOUNTER — Inpatient Hospital Stay: Payer: BC Managed Care – PPO | Attending: Oncology | Admitting: Oncology

## 2021-03-17 ENCOUNTER — Encounter: Payer: Self-pay | Admitting: Oncology

## 2021-03-17 DIAGNOSIS — Z148 Genetic carrier of other disease: Secondary | ICD-10-CM | POA: Diagnosis not present

## 2021-03-17 DIAGNOSIS — D751 Secondary polycythemia: Secondary | ICD-10-CM

## 2021-03-17 DIAGNOSIS — K7469 Other cirrhosis of liver: Secondary | ICD-10-CM

## 2021-03-17 NOTE — Progress Notes (Signed)
HEMATOLOGY-ONCOLOGY TeleHEALTH VISIT PROGRESS NOTE  I connected with Tamara Dodson on 03/17/21  at  2:15 PM EDT by video enabled telemedicine visit and verified that I am speaking with the correct person using two identifiers. I discussed the limitations, risks, security and privacy concerns of performing an evaluation and management service by telemedicine and the availability of in-person appointments. The patient expressed understanding and agreed to proceed.   Other persons participating in the visit and their role in the encounter:  None  Patient's location: Home  Provider's location: office Chief Complaint: Elevated ferritin, heterozygous hemochromatosis, erythrocytosis  PERTINENT HEMATOLOGY HISTORY Liver biopsy  Moderate parenchymal and sinusoidal cirrhosis, septal fibrosis with areas of early bridging fibrosis.  At least stage II.  Focal and minimal steatosis with no evidence of steatohepatitis.  NAFLD score 1 out of 8.  Fatty liver disease, recommend lifestyle modification including exercise, healthy diet.  Avoid alcohol. July 2020 ultrasound abdomen showed fatty liver disease, liver enlarged measuring up to 21 cm. Mildly nodular contour to the liver raising the possibility of cirrhosis.  Her elevated ferritin and TSAT  saturation can be compounded by chronic liver disease. Discussed about the recommendation of first-degree relatives to be tested for hemochromatosis mutations. We spent sufficient time to discuss many aspect of care, questions were answered to patient's satisfaction.  INTERVAL HISTORY Tamara Dodson is a 60 y.o. female who has above history reviewed by me today presents for follow up visit for management of Elevated ferritin, heterozygous hemochromatosis, erythrocytosis Problems and complaints are listed below:  She has no new complaints.  Patient had cervical spine surgery 3 weeks ago.  She tolerated procedure well.  Review of Systems  Constitutional: Negative for  appetite change, chills, fatigue and fever.  HENT:   Negative for hearing loss and voice change.   Eyes: Negative for eye problems.  Respiratory: Negative for chest tightness and cough.   Cardiovascular: Negative for chest pain.  Gastrointestinal: Negative for abdominal distention, abdominal pain and blood in stool.  Endocrine: Negative for hot flashes.  Genitourinary: Negative for difficulty urinating and frequency.   Musculoskeletal: Negative for arthralgias.  Skin: Negative for itching and rash.  Neurological: Negative for extremity weakness.  Hematological: Negative for adenopathy.  Psychiatric/Behavioral: Negative for confusion.    Past Medical History:  Diagnosis Date  . Anemia   . Arthritis    osteoarthrosis  . Cirrhosis (HCC)    seeing Dr Cathie Hoops  . Fatty liver   . Hypertension   . Iron overload 07/18/2019  . Lumbago   . Lymph edema    lower extremities  . Morbid obesity (HCC)    Past Surgical History:  Procedure Laterality Date  . ANTERIOR CERVICAL DECOMP/DISCECTOMY FUSION N/A 02/24/2021   Procedure: ANTERIOR CERVICAL DECOMPRESSION/DISCECTOMY FUSION 2 LEVELS C3-5;  Surgeon: Venetia Night, MD;  Location: [facility];  Service: Neurosurgery;  Laterality: N/A;  . CHOLECYSTECTOMY    . ESOPHAGOGASTRODUODENOSCOPY N/A 01/25/2021   Procedure: ESOPHAGOGASTRODUODENOSCOPY (EGD);  Surgeon: Toledo, Boykin Nearing, MD;  Location: [facility];  Service: Gastroenterology;  Laterality: N/A;  C-19 test on 01/22/2021 Requesting to be the last case due to her transportation  . JOINT REPLACEMENT Left    knee, hip  . JOINT REPLACEMENT Bilateral    knee  . KNEE ARTHROPLASTY Right 09/21/2015   Procedure: COMPUTER ASSISTED TOTAL KNEE ARTHROPLASTY;  Surgeon: Donato Heinz, MD;  Location: [facility];  Service: Orthopedics;  Laterality: Right;  . TONSILLECTOMY    . TOTAL HIP ARTHROPLASTY Right 11/17/2017  Procedure: TOTAL HIP ARTHROPLASTY;  Surgeon: Donato Heinz, MD;  Location: [facility];  Service:  Orthopedics;  Laterality: Right;    Family History  Problem Relation Age of Onset  . Breast cancer Mother        76's    Social History   Socioeconomic History  . Marital status: Married    Spouse name: Casimiro Needle   . Number of children: 3  . Years of education: Not on file  . Highest education level: Not on file  Occupational History  . Not on file  Tobacco Use  . Smoking status: Never Smoker  . Smokeless tobacco: Never Used  Vaping Use  . Vaping Use: Never used  Substance and Sexual Activity  . Alcohol use: No  . Drug use: No  . Sexual activity: Yes  Other Topics Concern  . Not on file  Social History Narrative  . Not on file   Social Determinants of Health   Financial Resource Strain: Not on file  Food Insecurity: Not on file  Transportation Needs: Not on file  Physical Activity: Not on file  Stress: Not on file  Social Connections: Not on file  Intimate Partner Violence: Not on file    Current Outpatient Medications on File Prior to Visit  Medication Sig Dispense Refill  . acetaminophen (TYLENOL) 500 MG tablet Take 1,000 mg by mouth every 6 (six) hours as needed for moderate pain.    Marland Kitchen amLODipine (NORVASC) 5 MG tablet Take 5 mg by mouth every morning.    Marland Kitchen aspirin EC 81 MG tablet Take 81 mg daily by mouth.    . Cholecalciferol (VITAMIN D) 50 MCG (2000 UT) tablet Take 4,000 Units by mouth daily.    . furosemide (LASIX) 20 MG tablet Take 40 mg by mouth daily.    . Magnesium 400 MG TABS Take 400 mg by mouth 2 (two) times daily.    . methocarbamol (ROBAXIN) 500 MG tablet Take 1-2 tablets (500-1,000 mg total) by mouth every 6 (six) hours as needed for muscle spasms. 60 tablet 0  . pantoprazole (PROTONIX) 40 MG tablet Take 40 mg by mouth 2 (two) times daily.    . Potassium 99 MG TABS Take 99 mg by mouth 2 (two) times daily.    . Turmeric 500 MG CAPS Take 1,000 mg by mouth daily.    . vitamin C (ASCORBIC ACID) 500 MG tablet Take 1,000 mg by mouth daily.    Marland Kitchen zinc  gluconate 50 MG tablet Take 100 mg by mouth daily.     No current facility-administered medications on file prior to visit.    Allergies  Allergen Reactions  . Gluten Meal Diarrhea    bloating       Observations/Objective: Today's Vitals   03/17/21 1359  PainSc: 8    There is no height or weight on file to calculate BMI.  Physical Exam Neurological:     Mental Status: She is alert.     CBC    Component Value Date/Time   WBC 7.5 03/15/2021 1348   RBC 5.11 03/15/2021 1348   HGB 15.9 (H) 03/15/2021 1348   HGB 13.8 02/20/2013 0723   HCT 46.4 (H) 03/15/2021 1348   HCT 46.7 02/05/2013 1406   PLT 201 03/15/2021 1348   PLT 185 02/20/2013 0723   MCV 90.8 03/15/2021 1348   MCV 92 02/05/2013 1406   MCH 31.1 03/15/2021 1348   MCHC 34.3 03/15/2021 1348   RDW 11.9 03/15/2021 1348  RDW 13.0 02/05/2013 1406   LYMPHSABS 1.2 03/15/2021 1348   MONOABS 0.5 03/15/2021 1348   EOSABS 0.1 03/15/2021 1348   BASOSABS 0.1 03/15/2021 1348    CMP     Component Value Date/Time   NA 136 02/12/2021 1018   NA 137 02/20/2013 0723   K 3.7 02/12/2021 1018   K 3.7 02/20/2013 0723   CL 101 02/12/2021 1018   CL 104 02/20/2013 0723   CO2 26 02/12/2021 1018   CO2 26 02/20/2013 0723   GLUCOSE 116 (H) 02/12/2021 1018   GLUCOSE 126 (H) 02/20/2013 0723   BUN 16 02/12/2021 1018   BUN 6 (L) 02/20/2013 0723   CREATININE 0.65 02/12/2021 1018   CREATININE 0.54 (L) 02/20/2013 0723   CALCIUM 9.8 02/12/2021 1018   CALCIUM 8.7 02/20/2013 0723   PROT 7.8 03/15/2021 1348   ALBUMIN 4.4 03/15/2021 1348   AST 38 03/15/2021 1348   ALT 25 03/15/2021 1348   ALKPHOS 76 03/15/2021 1348   BILITOT 0.6 03/15/2021 1348   GFRNONAA >60 02/12/2021 1018   GFRNONAA >60 02/20/2013 0723   GFRAA >60 12/11/2017 1121   GFRAA >60 02/20/2013 0723     Assessment and Plan: 1. Iron overload   2. Hemochromatosis carrier   3. Other cirrhosis of liver (HCC)     #Iron overload in the context of heterozygous H 50 3D  mutation as well as chronic liver fibrosis Labs reviewed and discussed with patient Iron panel showed iron saturation of 38, ferritin level 2278 Recommend to proceed with phlebotomy 500 cc x 1 today. Repeat phlebotomy 500 cc 1 week. H&H in 2 weeks in 4 weeks +/- phlebotomy. Patient will follow up with me in 6 weeks.  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  Recommend sleep apnea evaluation. Chronic fatty liver disease, liver fibrosis.  Follow Up Instructions: 6 weeks   I discussed the assessment and treatment plan with the patient. The patient was provided an opportunity to ask questions and all were answered. The patient agreed with the plan and demonstrated an understanding of the instructions.  The patient was advised to call back or seek an in-person evaluation if the symptoms worsen or if the condition fails to improve as anticipated.    Rickard Patience, MD 03/17/2021 9:22 PM

## 2021-03-17 NOTE — Progress Notes (Signed)
Patient had cervical surgery on 3/9 and still having trouble sleeping due to the pain.

## 2021-03-18 ENCOUNTER — Inpatient Hospital Stay: Payer: BC Managed Care – PPO

## 2021-03-18 NOTE — Progress Notes (Signed)
Therapeutic phlebotomy with 500 ml blood removed. Pt tolerated procedure well. No complaints at discharge.

## 2021-03-25 ENCOUNTER — Inpatient Hospital Stay: Payer: BC Managed Care – PPO | Attending: Oncology

## 2021-03-25 DIAGNOSIS — D751 Secondary polycythemia: Secondary | ICD-10-CM | POA: Insufficient documentation

## 2021-03-25 DIAGNOSIS — Z148 Genetic carrier of other disease: Secondary | ICD-10-CM | POA: Diagnosis not present

## 2021-03-25 NOTE — Progress Notes (Signed)
500 ml Phlebotomy completed. Patient tolerated procedure well. Discharged without any complaints.

## 2021-03-26 ENCOUNTER — Ambulatory Visit
Admission: RE | Admit: 2021-03-26 | Discharge: 2021-03-26 | Disposition: A | Discharge status: Home / Self Care | Payer: BC Managed Care – PPO | Source: Ambulatory Visit | Attending: Gastroenterology | Admitting: Gastroenterology

## 2021-03-26 ENCOUNTER — Other Ambulatory Visit: Payer: Self-pay

## 2021-03-26 DIAGNOSIS — R6881 Early satiety: Secondary | ICD-10-CM | POA: Diagnosis present

## 2021-03-26 DIAGNOSIS — R1013 Epigastric pain: Secondary | ICD-10-CM | POA: Diagnosis present

## 2021-03-26 MED ORDER — TECHNETIUM TC 99M SULFUR COLLOID
2.0000 | Freq: Once | INTRAVENOUS | Status: AC | PRN
  Administered 2021-03-26: 0.36 via ORAL

## 2021-03-29 ENCOUNTER — Other Ambulatory Visit: Payer: Self-pay | Admitting: Family Medicine

## 2021-03-29 DIAGNOSIS — Z1231 Encounter for screening mammogram for malignant neoplasm of breast: Secondary | ICD-10-CM

## 2021-03-31 ENCOUNTER — Inpatient Hospital Stay: Payer: BC Managed Care – PPO

## 2021-03-31 ENCOUNTER — Other Ambulatory Visit: Payer: Self-pay

## 2021-03-31 DIAGNOSIS — D751 Secondary polycythemia: Secondary | ICD-10-CM | POA: Diagnosis not present

## 2021-03-31 LAB — HEMOGLOBIN AND HEMATOCRIT, BLOOD
HCT: 44.3 % (ref 36.0–46.0)
Hemoglobin: 15 g/dL (ref 12.0–15.0)

## 2021-03-31 NOTE — Progress Notes (Signed)
Completed 500 ml phlebotomy today. Pt tolerated procedure well. Drinking plenty of water post procedure. Feeling at her baseline at discharge.

## 2021-04-09 ENCOUNTER — Other Ambulatory Visit: Payer: Self-pay

## 2021-04-09 ENCOUNTER — Ambulatory Visit
Admission: RE | Admit: 2021-04-09 | Discharge: 2021-04-09 | Disposition: A | Discharge status: Home / Self Care | Payer: BC Managed Care – PPO | Source: Ambulatory Visit | Attending: Family Medicine | Admitting: Family Medicine

## 2021-04-09 DIAGNOSIS — Z1231 Encounter for screening mammogram for malignant neoplasm of breast: Secondary | ICD-10-CM | POA: Diagnosis present

## 2021-04-14 ENCOUNTER — Other Ambulatory Visit: Payer: Self-pay

## 2021-04-14 ENCOUNTER — Inpatient Hospital Stay: Payer: BC Managed Care – PPO

## 2021-04-14 DIAGNOSIS — D751 Secondary polycythemia: Secondary | ICD-10-CM | POA: Diagnosis not present

## 2021-04-14 LAB — CBC WITH DIFFERENTIAL/PLATELET
Abs Immature Granulocytes: 0.03 10*3/uL (ref 0.00–0.07)
Basophils Absolute: 0.1 10*3/uL (ref 0.0–0.1)
Basophils Relative: 1 %
Eosinophils Absolute: 0.1 10*3/uL (ref 0.0–0.5)
Eosinophils Relative: 2 %
HCT: 42.8 % (ref 36.0–46.0)
Hemoglobin: 14.3 g/dL (ref 12.0–15.0)
Immature Granulocytes: 0 %
Lymphocytes Relative: 13 %
Lymphs Abs: 1.1 10*3/uL (ref 0.7–4.0)
MCH: 31.4 pg (ref 26.0–34.0)
MCHC: 33.4 g/dL (ref 30.0–36.0)
MCV: 93.9 fL (ref 80.0–100.0)
Monocytes Absolute: 0.6 10*3/uL (ref 0.1–1.0)
Monocytes Relative: 8 %
Neutro Abs: 6 10*3/uL (ref 1.7–7.7)
Neutrophils Relative %: 76 %
Platelets: 190 10*3/uL (ref 150–400)
RBC: 4.56 MIL/uL (ref 3.87–5.11)
RDW: 12.8 % (ref 11.5–15.5)
WBC: 7.9 10*3/uL (ref 4.0–10.5)
nRBC: 0 % (ref 0.0–0.2)

## 2021-04-14 LAB — IRON AND TIBC
Iron: 58 ug/dL (ref 28–170)
Saturation Ratios: 22 % (ref 10.4–31.8)
TIBC: 265 ug/dL (ref 250–450)
UIBC: 207 ug/dL

## 2021-04-14 LAB — FERRITIN: Ferritin: 1528 ng/mL — ABNORMAL HIGH (ref 11–307)

## 2021-04-14 NOTE — Progress Notes (Signed)
Therapeutic phlebotomy performed to left wrist using 20g angiocath. Procedure started at 1327 and ended at 1347. removed. Pt tolerated well. Declined offer for beverage, but had own water she was drinking. Stable at dischrge.

## 2021-04-22 ENCOUNTER — Other Ambulatory Visit: Payer: Self-pay | Admitting: Physician Assistant

## 2021-04-22 DIAGNOSIS — R519 Headache, unspecified: Secondary | ICD-10-CM

## 2021-04-23 ENCOUNTER — Other Ambulatory Visit: Payer: Self-pay | Admitting: Neurology

## 2021-04-23 DIAGNOSIS — R42 Dizziness and giddiness: Secondary | ICD-10-CM

## 2021-04-23 DIAGNOSIS — R519 Headache, unspecified: Secondary | ICD-10-CM

## 2021-04-23 DIAGNOSIS — R110 Nausea: Secondary | ICD-10-CM

## 2021-04-27 ENCOUNTER — Other Ambulatory Visit: Payer: Self-pay

## 2021-04-28 ENCOUNTER — Inpatient Hospital Stay: Payer: BC Managed Care – PPO | Attending: Oncology

## 2021-04-28 DIAGNOSIS — D751 Secondary polycythemia: Secondary | ICD-10-CM | POA: Insufficient documentation

## 2021-04-28 DIAGNOSIS — K7469 Other cirrhosis of liver: Secondary | ICD-10-CM | POA: Insufficient documentation

## 2021-04-28 DIAGNOSIS — R7989 Other specified abnormal findings of blood chemistry: Secondary | ICD-10-CM | POA: Diagnosis not present

## 2021-04-28 LAB — CBC WITH DIFFERENTIAL/PLATELET
Abs Immature Granulocytes: 0.06 10*3/uL (ref 0.00–0.07)
Basophils Absolute: 0.1 10*3/uL (ref 0.0–0.1)
Basophils Relative: 1 %
Eosinophils Absolute: 0.2 10*3/uL (ref 0.0–0.5)
Eosinophils Relative: 2 %
HCT: 46 % (ref 36.0–46.0)
Hemoglobin: 15.6 g/dL — ABNORMAL HIGH (ref 12.0–15.0)
Immature Granulocytes: 1 %
Lymphocytes Relative: 15 %
Lymphs Abs: 1.1 10*3/uL (ref 0.7–4.0)
MCH: 31 pg (ref 26.0–34.0)
MCHC: 33.9 g/dL (ref 30.0–36.0)
MCV: 91.5 fL (ref 80.0–100.0)
Monocytes Absolute: 0.4 10*3/uL (ref 0.1–1.0)
Monocytes Relative: 5 %
Neutro Abs: 5.6 10*3/uL (ref 1.7–7.7)
Neutrophils Relative %: 76 %
Platelets: 218 10*3/uL (ref 150–400)
RBC: 5.03 MIL/uL (ref 3.87–5.11)
RDW: 12.5 % (ref 11.5–15.5)
WBC: 7.4 10*3/uL (ref 4.0–10.5)
nRBC: 0 % (ref 0.0–0.2)

## 2021-04-28 LAB — FERRITIN: Ferritin: 1566 ng/mL — ABNORMAL HIGH (ref 11–307)

## 2021-04-28 LAB — IRON AND TIBC
Iron: 86 ug/dL (ref 28–170)
Saturation Ratios: 29 % (ref 10.4–31.8)
TIBC: 294 ug/dL (ref 250–450)
UIBC: 208 ug/dL

## 2021-04-30 ENCOUNTER — Encounter: Payer: Self-pay | Admitting: Oncology

## 2021-04-30 ENCOUNTER — Inpatient Hospital Stay: Payer: BC Managed Care – PPO

## 2021-04-30 ENCOUNTER — Inpatient Hospital Stay (HOSPITAL_BASED_OUTPATIENT_CLINIC_OR_DEPARTMENT_OTHER): Payer: BC Managed Care – PPO | Admitting: Oncology

## 2021-04-30 VITALS — BP 161/87 | HR 111 | Temp 98.1°F | Resp 18 | Wt 333.0 lb

## 2021-04-30 DIAGNOSIS — M2550 Pain in unspecified joint: Secondary | ICD-10-CM | POA: Diagnosis not present

## 2021-04-30 DIAGNOSIS — K7469 Other cirrhosis of liver: Secondary | ICD-10-CM

## 2021-04-30 DIAGNOSIS — D751 Secondary polycythemia: Secondary | ICD-10-CM | POA: Diagnosis not present

## 2021-04-30 DIAGNOSIS — Z148 Genetic carrier of other disease: Secondary | ICD-10-CM | POA: Diagnosis not present

## 2021-04-30 NOTE — Progress Notes (Signed)
Patient denies new problems/concerns today.   °

## 2021-05-01 NOTE — Progress Notes (Signed)
Hematology/Oncology  [facility] Telephone:([phone] [phone]   Patient Care Team: [name], MD as PCP - General (Family Medicine) Earlie Server, MD as Consulting Physician (Hematology and Oncology)  REFERRING PROVIDER: [name], MD  REASON FOR VISIT:  Follow-up for erythrocytosis and iron overload.  HISTORY OF PRESENTING ILLNESS:  Tamara Dodson is a 60 y.o. female who was seen in consultation at the request of [name], MD for evaluation of polycytosis/erythrocytosis Reviewed patient's recent lab work  06/27/2019 Labs showed elevated hemoglobin at 16.9,  total white count 8.5, platelet counts 225,000.  Chronic Onset, duration since 2016.  She does not smoke.  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   # Heterozygous H63D mutation.  She has a history of iron deficiency after hip replacement and she took oral iron supplementation which was discontinued.  09/24/2019 liver biopsy showed moderate parenchymal and sinusoidal cirrhosis, patient heterozygous for H63D gene.  Septal fibrosis with areas suggestive of early bridging fibrosis [at least stage II fibrosis]. Focal and minimal steatosis with no evidence of steatohepatitis.  Total NAFLD score 1 of 8. --Scattered iron granules are identified on the HE sections. The iron stain highlights moderate staining in sinusoidal and periportal parenchymal patterns of distribution  Lost follow up with me after her visit in September 2020 and restablished care 03/17/2021 per recommendation of gastroenterology and the neurosurgeon Dr. Cari Caraway  Elevated Ferritin in the context of liver fibrosis- phlebotomy  02/24/2021 s/p ACDF C3-5   INTERVAL HISTORY Tamara Dodson is a 60 y.o. female who has above history reviewed by me today presents for follow up visit for management of iron overload and erythrocytosis. Problems and complaints are listed below: Patient tolerates  phlebotomy.  Today no new complaints.  Chronic fatigue and arthralgia  unchanged Takes gabapentin for neuropathy Headache, patient establish care with neurology Dr. Melrose Nakayama. Started on meclizine for dizziness, nortriptyline for headache.  She was also recommended to have MRI brain for further evaluation.   Review of Systems  Constitutional: Positive for fatigue. Negative for appetite change, chills and fever.  HENT:   Negative for hearing loss and voice change.   Eyes: Negative for eye problems.  Respiratory: Negative for chest tightness and cough.   Cardiovascular: Negative for chest pain.  Gastrointestinal: Negative for abdominal distention, abdominal pain and blood in stool.  Endocrine: Negative for hot flashes.  Genitourinary: Negative for difficulty urinating and frequency.   Musculoskeletal: Positive for arthralgias.  Skin: Negative for itching and rash.  Neurological: Negative for extremity weakness.  Hematological: Negative for adenopathy.  Psychiatric/Behavioral: Negative for confusion.    MEDICAL HISTORY:  Past Medical History:  Diagnosis Date  . Anemia   . Arthritis    osteoarthrosis  . Cirrhosis ([place])    seeing Dr Tasia Catchings  . Fatty liver   . Hypertension   . Iron overload 07/18/2019  . Lumbago   . Lymph edema    lower extremities  . Morbid obesity ([place])     SURGICAL HISTORY: Past Surgical History:  Procedure Laterality Date  . ANTERIOR CERVICAL DECOMP/DISCECTOMY FUSION N/A 02/24/2021   Procedure: ANTERIOR CERVICAL DECOMPRESSION/DISCECTOMY FUSION 2 LEVELS C3-5;  Surgeon: Meade Maw, MD;  Location: [facility] ORS;  Service: Neurosurgery;  Laterality: N/A;  . CHOLECYSTECTOMY    . ESOPHAGOGASTRODUODENOSCOPY N/A 01/25/2021   Procedure: ESOPHAGOGASTRODUODENOSCOPY (EGD);  Surgeon: Toledo, [name], MD;  Location: [facility] ENDOSCOPY;  Service: Gastroenterology;  Laterality: N/A;  C-19 test on 01/22/2021 Requesting to be the last case due to her transportation  . JOINT  REPLACEMENT Left    knee, hip  . JOINT REPLACEMENT Bilateral    knee  . KNEE ARTHROPLASTY Right 09/21/2015   Procedure: COMPUTER ASSISTED TOTAL KNEE ARTHROPLASTY;  Surgeon: Dereck Leep, MD;  Location: [facility] ORS;  Service: Orthopedics;  Laterality: Right;  . TONSILLECTOMY    . TOTAL HIP ARTHROPLASTY Right 11/17/2017   Procedure: TOTAL HIP ARTHROPLASTY;  Surgeon: Dereck Leep, MD;  Location: [facility] ORS;  Service: Orthopedics;  Laterality: Right;    SOCIAL HISTORY: Social History   Socioeconomic History  . Marital status: Married    Spouse name: Legrand Como   . Number of children: 3  . Years of education: Not on file  . Highest education level: Not on file  Occupational History  . Not on file  Tobacco Use  . Smoking status: Never Smoker  . Smokeless tobacco: Never Used  Vaping Use  . Vaping Use: Never used  Substance and Sexual Activity  . Alcohol use: No  . Drug use: No  . Sexual activity: Yes  Other Topics Concern  . Not on file  Social History Narrative  . Not on file   Social Determinants of Health   Financial Resource Strain: Not on file  Food Insecurity: Not on file  Transportation Needs: Not on file  Physical Activity: Not on file  Stress: Not on file  Social Connections: Not on file  Intimate Partner Violence: Not on file    FAMILY HISTORY: Family History  Problem Relation Age of Onset  . Breast cancer Mother        43's    ALLERGIES:  is allergic to gluten meal.  MEDICATIONS:  Current Outpatient Medications  Medication Sig Dispense Refill  . acetaminophen (TYLENOL) 500 MG tablet Take 1,000 mg by mouth every 6 (six) hours as needed for moderate pain.    Marland Kitchen amLODipine (NORVASC) 5 MG tablet Take 5 mg by mouth every morning.    Marland Kitchen aspirin EC 81 MG tablet Take 81 mg daily by mouth.    . celecoxib (CELEBREX) 100 MG capsule Take 100 mg by mouth 2 (two) times daily.    . Cholecalciferol (VITAMIN D) 50 MCG (2000 UT) tablet Take 4,000 Units by mouth daily.    .  furosemide (LASIX) 20 MG tablet Take 40 mg by mouth daily.    Marland Kitchen gabapentin (NEURONTIN) 300 MG capsule Take by mouth.    . Magnesium 400 MG TABS Take 400 mg by mouth 2 (two) times daily.    . meclizine (ANTIVERT) 12.5 MG tablet Take 12.$RemoveBefore'5mg'TYynlmNGsfkdF$  twice daily for dizziness.    . methocarbamol (ROBAXIN) 500 MG tablet Take 1-2 tablets (500-1,000 mg total) by mouth every 6 (six) hours as needed for muscle spasms. 60 tablet 0  . nortriptyline (PAMELOR) 10 MG capsule Start Nortriptyline (Pamelor) 10 mg nightly for one week, then increase to 20 mg nightly for headaches.    . pantoprazole (PROTONIX) 40 MG tablet Take 40 mg by mouth 2 (two) times daily.    . Potassium 99 MG TABS Take 99 mg by mouth 2 (two) times daily.    . Turmeric 500 MG CAPS Take 1,000 mg by mouth daily.    . vitamin C (ASCORBIC ACID) 500 MG tablet Take 1,000 mg by mouth daily.    Marland Kitchen zinc gluconate 50 MG tablet Take 100 mg by mouth daily.     No current facility-administered medications  for this visit.     PHYSICAL EXAMINATION: ECOG PERFORMANCE STATUS: 1 - Symptomatic but completely ambulatory Vitals:   04/30/21 1338  BP: (!) 161/87  Pulse: (!) 111  Resp: 18  Temp: 98.1 F (36.7 C)  SpO2: 98%   Filed Weights   04/30/21 1338  Weight: (!) 333 lb (151 kg)    Physical Exam Constitutional:      General: She is not in acute distress.    Appearance: She is obese.  HENT:     Head: Normocephalic and atraumatic.  Eyes:     General: No scleral icterus.    Pupils: Pupils are equal, round, and reactive to light.  Cardiovascular:     Rate and Rhythm: Normal rate and regular rhythm.     Heart sounds: Normal heart sounds.  Pulmonary:     Effort: Pulmonary effort is normal. No respiratory distress.     Breath sounds: No wheezing.  Abdominal:     General: Bowel sounds are normal. There is no distension.     Palpations: Abdomen is soft.     Tenderness: There is no abdominal tenderness.  Musculoskeletal:        General: No  deformity. Normal range of motion.     Cervical back: Normal range of motion and neck supple.  Skin:    General: Skin is warm and dry.     Findings: No erythema or rash.  Neurological:     Mental Status: She is alert and oriented to person, place, and time.     Cranial Nerves: No cranial nerve deficit.     Coordination: Coordination normal.  Psychiatric:        Behavior: Behavior normal.        Thought Content: Thought content normal.     RADIOGRAPHIC STUDIES: I have personally reviewed the radiological images as listed and agreed with the findings in the report. DG Cervical Spine 2 or 3 views  Result Date: 02/24/2021 CLINICAL DATA:  Post cervical fusion EXAM: CERVICAL SPINE - 2-3 VIEW COMPARISON:  None. FINDINGS: Postoperative changes of recent anterior fusion at C3-C5 with plate and screw fixation and interbody allografts. There is prevertebral soft tissue swelling and air. Multilevel disc space narrowing above and below operative levels. Uncovertebral and facet hypertrophy. IMPRESSION: Expected postoperative changes of recent ACDF at C3-C5. Electronically Signed   By: Macy Mis M.D.   On: 02/24/2021 15:33   DG Cervical Spine 2 or 3 views  Result Date: 02/24/2021 CLINICAL DATA:  Surgery.  ACDF at C3-C4 and C4-C5. EXAM: CERVICAL SPINE - 2-3 VIEW; DG C-ARM 1-60 MIN COMPARISON:  MRI January 2022. FINDINGS: Fluoro time: 2.2 seconds. Three C-arm fluoroscopic images were obtained intraoperatively and submitted for post operative interpretation. The first image demonstrates surgical probe projecting at the anterior C3-C4 disc space. Subsequent 2 images demonstrate postsurgical changes of C3 through C5 ACDF with plate and screws and intervening spacers at C3-C4 and C4-C5. No unexpected findings. Please see the performing provider's procedural report for further detail. IMPRESSION: C3-C5 ACDF. Electronically Signed   By: Margaretha Sheffield MD   On: 02/24/2021 09:58   NM GASTRIC EMPTYING  Result  Date: 03/28/2021 CLINICAL DATA:  Mild abdominal bloating for few hours after eating. EXAM: NUCLEAR MEDICINE GASTRIC EMPTYING SCAN TECHNIQUE: After oral ingestion of radiolabeled meal, sequential abdominal images were obtained for 4 hours. Percentage of activity emptying the stomach was calculated at 1 hour, 2 hour, 3 hour, and 4 hours. RADIOPHARMACEUTICALS:  2.36 mCi Tc-26m sulfur colloid  in standardized meal COMPARISON:  None. FINDINGS: Expected location of the stomach in the left upper quadrant. Ingested meal empties the stomach gradually over the course of the study. 50% emptied at 1 hr ( normal >= 10%) 99% emptied at 2 hr ( normal >= 40%) 100% emptied at 3 hr ( normal >= 70%) IMPRESSION: Normal gastric emptying study. Electronically Signed   By: Gerome Sam III M.D   On: 03/28/2021 14:03   DG C-Arm 1-60 Min  Result Date: 02/24/2021 CLINICAL DATA:  Surgery.  ACDF at C3-C4 and C4-C5. EXAM: CERVICAL SPINE - 2-3 VIEW; DG C-ARM 1-60 MIN COMPARISON:  MRI January 2022. FINDINGS: Fluoro time: 2.2 seconds. Three C-arm fluoroscopic images were obtained intraoperatively and submitted for post operative interpretation. The first image demonstrates surgical probe projecting at the anterior C3-C4 disc space. Subsequent 2 images demonstrate postsurgical changes of C3 through C5 ACDF with plate and screws and intervening spacers at C3-C4 and C4-C5. No unexpected findings. Please see the performing provider's procedural report for further detail. IMPRESSION: C3-C5 ACDF. Electronically Signed   By: Feliberto Harts MD   On: 02/24/2021 09:58   DG C-Arm 1-60 Min  Result Date: 02/24/2021 CLINICAL DATA:  Surgery.  ACDF at C3-C4 and C4-C5. EXAM: CERVICAL SPINE - 2-3 VIEW; DG C-ARM 1-60 MIN COMPARISON:  MRI January 2022. FINDINGS: Fluoro time: 2.2 seconds. Three C-arm fluoroscopic images were obtained intraoperatively and submitted for post operative interpretation. The first image demonstrates surgical probe projecting at  the anterior C3-C4 disc space. Subsequent 2 images demonstrate postsurgical changes of C3 through C5 ACDF with plate and screws and intervening spacers at C3-C4 and C4-C5. No unexpected findings. Please see the performing provider's procedural report for further detail. IMPRESSION: C3-C5 ACDF. Electronically Signed   By: Feliberto Harts MD   On: 02/24/2021 09:58   MM 3D SCREEN BREAST BILATERAL  Result Date: 04/12/2021 CLINICAL DATA:  Screening. EXAM: DIGITAL SCREENING BILATERAL MAMMOGRAM WITH TOMOSYNTHESIS AND CAD TECHNIQUE: Bilateral screening digital craniocaudal and mediolateral oblique mammograms were obtained. Bilateral screening digital breast tomosynthesis was performed. The images were evaluated with computer-aided detection. COMPARISON:  Previous exam(s). ACR Breast Density Category b: There are scattered areas of fibroglandular density. FINDINGS: There are no findings suspicious for malignancy. The images were evaluated with computer-aided detection. IMPRESSION: No mammographic evidence of malignancy. A result letter of this screening mammogram will be mailed directly to the patient. RECOMMENDATION: Screening mammogram in one year. (Code:SM-B-01Y) BI-RADS CATEGORY  1: Negative. Electronically Signed   By: Amie Portland M.D.   On: 04/12/2021 11:10    LABORATORY DATA:  I have reviewed the data as listed Lab Results  Component Value Date   WBC 7.4 04/28/2021   HGB 15.6 (H) 04/28/2021   HCT 46.0 04/28/2021   MCV 91.5 04/28/2021   PLT 218 04/28/2021   Recent Labs    02/01/21 1348 02/12/21 1018 03/15/21 1348  NA  --  136  --   K  --  3.7  --   CL  --  101  --   CO2  --  26  --   GLUCOSE  --  116*  --   BUN  --  16  --   CREATININE  --  0.65  --   CALCIUM  --  9.8  --   GFRNONAA  --  >60  --   PROT 8.1  --  7.8  ALBUMIN 4.4  --  4.4  AST 31  --  38  ALT 31  --  25  ALKPHOS 75  --  76  BILITOT 0.8  --  0.6  BILIDIR <0.1  --  0.1  IBILI NOT CALCULATED  --  0.5    Iron/TIBC/Ferritin/ %Sat    Component Value Date/Time   IRON 86 04/28/2021 1326   TIBC 294 04/28/2021 1326   FERRITIN 1,566 (H) 04/28/2021 1326   IRONPCTSAT 29 04/28/2021 1326        ASSESSMENT & PLAN:  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 Other cirrhosis of liver ([place])   3. Erythrocytosis   4. Arthralgia, unspecified joint    #Elevated ferritin in the context of heterozygous H53D mutation as well as liver fibrosis. 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 The iron stain highlights moderate staining in sinusoidal and periportal parenchymal patterns of distribution  Initially elevated ferritin 2278, and increased iron saturation 38. Even though heterozygous hemochromatosis carrier usually do not become symptomatic from iron overload,  Her elevated ferritin and TSAT  saturation can be compounded by chronic liver disease.  She also previously was taking oral iron supplementation which may have precipitated the symptoms. in the context of liver stage II fibrosis, I recommend patient to proceed with phlebotomy. Today Iron panel has improved to ferritin 1566, iron saturation 29. phlebotomy today, repeat phlebotomy in 2 weeks,  4 weeks,  and 8 weeks.  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  Previous work-up showed Jak 2 mutation with reflex to other mutations are negative. I suspect that she may have secondary erythrocytosis due to sleep apnea.  Recommend her to discuss with PCP for sleep study. . We spent sufficient time to discuss many aspect of care, questions were answered to patient's satisfaction. The patient knows to call the clinic with any problems questions or concerns.  Leron Croak, MD  Return of visit: 12 weeks.    Earlie Server, MD, PhD 05/01/2021

## 2021-05-04 ENCOUNTER — Other Ambulatory Visit: Payer: Self-pay | Admitting: Physician Assistant

## 2021-05-04 DIAGNOSIS — R110 Nausea: Secondary | ICD-10-CM

## 2021-05-04 DIAGNOSIS — R519 Headache, unspecified: Secondary | ICD-10-CM

## 2021-05-04 DIAGNOSIS — R42 Dizziness and giddiness: Secondary | ICD-10-CM

## 2021-05-13 ENCOUNTER — Other Ambulatory Visit: Payer: Self-pay

## 2021-05-14 ENCOUNTER — Inpatient Hospital Stay: Payer: BC Managed Care – PPO

## 2021-05-14 DIAGNOSIS — D751 Secondary polycythemia: Secondary | ICD-10-CM | POA: Diagnosis not present

## 2021-05-14 LAB — HEMOGLOBIN AND HEMATOCRIT, BLOOD
HCT: 45.4 % (ref 36.0–46.0)
Hemoglobin: 15.3 g/dL — ABNORMAL HIGH (ref 12.0–15.0)

## 2021-05-14 NOTE — Progress Notes (Signed)
Therapeutic phlebotomy performed in right AC using 20g angiocath. removed without incident. Tolerated procedure well. Encouraged fluid intake Vital signs stable at discharge.

## 2021-05-14 NOTE — Patient Instructions (Signed)

## 2021-05-15 ENCOUNTER — Other Ambulatory Visit: Payer: Self-pay

## 2021-05-15 ENCOUNTER — Ambulatory Visit
Admission: RE | Admit: 2021-05-15 | Discharge: 2021-05-15 | Disposition: A | Discharge status: Home / Self Care | Payer: BC Managed Care – PPO | Source: Ambulatory Visit | Attending: Neurology | Admitting: Neurology

## 2021-05-15 DIAGNOSIS — R42 Dizziness and giddiness: Secondary | ICD-10-CM

## 2021-05-15 DIAGNOSIS — R110 Nausea: Secondary | ICD-10-CM

## 2021-05-15 DIAGNOSIS — R519 Headache, unspecified: Secondary | ICD-10-CM

## 2021-05-15 MED ORDER — GADOBENATE DIMEGLUMINE 529 MG/ML IV SOLN
20.0000 mL | Freq: Once | INTRAVENOUS | Status: AC | PRN
  Administered 2021-05-15: 20 mL via INTRAVENOUS

## 2021-05-22 ENCOUNTER — Encounter: Payer: Self-pay | Admitting: Oncology

## 2021-05-28 ENCOUNTER — Inpatient Hospital Stay: Payer: BC Managed Care – PPO

## 2021-05-28 ENCOUNTER — Inpatient Hospital Stay: Payer: BC Managed Care – PPO | Attending: Oncology | Admitting: Oncology

## 2021-05-28 ENCOUNTER — Other Ambulatory Visit: Payer: Self-pay

## 2021-05-28 DIAGNOSIS — D751 Secondary polycythemia: Secondary | ICD-10-CM | POA: Diagnosis present

## 2021-05-28 DIAGNOSIS — Z148 Genetic carrier of other disease: Secondary | ICD-10-CM

## 2021-05-28 LAB — CBC WITH DIFFERENTIAL/PLATELET
Abs Immature Granulocytes: 0.09 10*3/uL — ABNORMAL HIGH (ref 0.00–0.07)
Basophils Absolute: 0.1 10*3/uL (ref 0.0–0.1)
Basophils Relative: 1 %
Eosinophils Absolute: 0.1 10*3/uL (ref 0.0–0.5)
Eosinophils Relative: 1 %
HCT: 45.4 % (ref 36.0–46.0)
Hemoglobin: 15.7 g/dL — ABNORMAL HIGH (ref 12.0–15.0)
Immature Granulocytes: 1 %
Lymphocytes Relative: 16 %
Lymphs Abs: 1.5 10*3/uL (ref 0.7–4.0)
MCH: 31.3 pg (ref 26.0–34.0)
MCHC: 34.6 g/dL (ref 30.0–36.0)
MCV: 90.6 fL (ref 80.0–100.0)
Monocytes Absolute: 0.8 10*3/uL (ref 0.1–1.0)
Monocytes Relative: 8 %
Neutro Abs: 7 10*3/uL (ref 1.7–7.7)
Neutrophils Relative %: 73 %
Platelets: 282 10*3/uL (ref 150–400)
RBC: 5.01 MIL/uL (ref 3.87–5.11)
RDW: 12.4 % (ref 11.5–15.5)
WBC: 9.6 10*3/uL (ref 4.0–10.5)
nRBC: 0 % (ref 0.0–0.2)

## 2021-05-28 LAB — IRON AND TIBC
Iron: 71 ug/dL (ref 28–170)
Saturation Ratios: 25 % (ref 10.4–31.8)
TIBC: 290 ug/dL (ref 250–450)
UIBC: 219 ug/dL

## 2021-05-28 LAB — FERRITIN: Ferritin: 1895 ng/mL — ABNORMAL HIGH (ref 11–307)

## 2021-05-28 NOTE — Progress Notes (Signed)
Removed 500 ml of blood per MD phlebotomy parameters. Pt tolerated procedure well.VSS. No complaints at time of discharge.

## 2021-05-28 NOTE — Patient Instructions (Signed)

## 2021-06-10 ENCOUNTER — Encounter: Payer: Self-pay | Admitting: Oncology

## 2021-06-10 ENCOUNTER — Other Ambulatory Visit: Payer: Self-pay

## 2021-06-10 ENCOUNTER — Inpatient Hospital Stay: Payer: BC Managed Care – PPO

## 2021-06-10 DIAGNOSIS — D751 Secondary polycythemia: Secondary | ICD-10-CM | POA: Diagnosis not present

## 2021-06-10 DIAGNOSIS — Z148 Genetic carrier of other disease: Secondary | ICD-10-CM

## 2021-06-10 LAB — HEMOGLOBIN AND HEMATOCRIT, BLOOD
HCT: 47.7 % — ABNORMAL HIGH (ref 36.0–46.0)
Hemoglobin: 15.8 g/dL — ABNORMAL HIGH (ref 12.0–15.0)

## 2021-06-10 NOTE — Patient Instructions (Signed)

## 2021-06-10 NOTE — Progress Notes (Signed)
Hemoglobin 15.8 HCT 47.7 Per MD order perform Phlebotomy removing 500 cc id Hemoglobin >12.5.   Therapeutic Phlebotomy performed per MD removing 500 cc, Pt tolerated procedure well, no s/s of distress noted, and pt denies any concerns. Pt and VS stable at discharge.

## 2021-06-11 ENCOUNTER — Other Ambulatory Visit: Payer: BC Managed Care – PPO

## 2021-06-22 ENCOUNTER — Telehealth: Payer: Self-pay | Admitting: Oncology

## 2021-06-22 NOTE — Telephone Encounter (Signed)
Patient called stating she isn't feeling well and would like to move her appointments to later in the week.  Lab moved to 7/8, Mychart moved to 7/14 and phlebotomy moved to 7/15.  Patient confirmed.

## 2021-06-23 ENCOUNTER — Inpatient Hospital Stay

## 2021-06-24 ENCOUNTER — Telehealth: Payer: BC Managed Care – PPO | Admitting: Oncology

## 2021-06-25 ENCOUNTER — Inpatient Hospital Stay: Payer: BC Managed Care – PPO | Attending: Oncology

## 2021-06-25 ENCOUNTER — Other Ambulatory Visit: Payer: Self-pay

## 2021-06-25 ENCOUNTER — Inpatient Hospital Stay

## 2021-06-25 DIAGNOSIS — M2550 Pain in unspecified joint: Secondary | ICD-10-CM

## 2021-06-25 DIAGNOSIS — Z148 Genetic carrier of other disease: Secondary | ICD-10-CM

## 2021-06-25 DIAGNOSIS — D751 Secondary polycythemia: Secondary | ICD-10-CM

## 2021-06-25 DIAGNOSIS — K7469 Other cirrhosis of liver: Secondary | ICD-10-CM

## 2021-06-25 LAB — CBC WITH DIFFERENTIAL/PLATELET
Abs Immature Granulocytes: 0.07 10*3/uL (ref 0.00–0.07)
Basophils Absolute: 0.1 10*3/uL (ref 0.0–0.1)
Basophils Relative: 1 %
Eosinophils Absolute: 0.1 10*3/uL (ref 0.0–0.5)
Eosinophils Relative: 2 %
HCT: 44.8 % (ref 36.0–46.0)
Hemoglobin: 14.5 g/dL (ref 12.0–15.0)
Immature Granulocytes: 1 %
Lymphocytes Relative: 12 %
Lymphs Abs: 1 10*3/uL (ref 0.7–4.0)
MCH: 30.7 pg (ref 26.0–34.0)
MCHC: 32.4 g/dL (ref 30.0–36.0)
MCV: 94.7 fL (ref 80.0–100.0)
Monocytes Absolute: 0.6 10*3/uL (ref 0.1–1.0)
Monocytes Relative: 8 %
Neutro Abs: 6 10*3/uL (ref 1.7–7.7)
Neutrophils Relative %: 76 %
Platelets: 224 10*3/uL (ref 150–400)
RBC: 4.73 MIL/uL (ref 3.87–5.11)
RDW: 12.8 % (ref 11.5–15.5)
WBC: 7.8 10*3/uL (ref 4.0–10.5)
nRBC: 0 % (ref 0.0–0.2)

## 2021-06-25 LAB — IRON AND TIBC
Iron: 50 ug/dL (ref 28–170)
Saturation Ratios: 19 % (ref 10.4–31.8)
TIBC: 270 ug/dL (ref 250–450)
UIBC: 220 ug/dL

## 2021-06-25 LAB — FERRITIN: Ferritin: 1422 ng/mL — ABNORMAL HIGH (ref 11–307)

## 2021-06-25 LAB — SEDIMENTATION RATE: Sed Rate: 26 mm/hr (ref 0–30)

## 2021-06-25 LAB — C-REACTIVE PROTEIN: CRP: 5.4 mg/dL — ABNORMAL HIGH (ref <1.0)

## 2021-06-28 LAB — PROTEIN ELECTROPHORESIS, SERUM
A/G Ratio: 1 (ref 0.7–1.7)
Albumin ELP: 3.6 g/dL (ref 2.9–4.4)
Alpha-1-Globulin: 0.3 g/dL (ref 0.0–0.4)
Alpha-2-Globulin: 1 g/dL (ref 0.4–1.0)
Beta Globulin: 1 g/dL (ref 0.7–1.3)
Gamma Globulin: 1.4 g/dL (ref 0.4–1.8)
Globulin, Total: 3.7 g/dL (ref 2.2–3.9)
Total Protein ELP: 7.3 g/dL (ref 6.0–8.5)

## 2021-06-28 LAB — IMMUNOGLOBULINS A/E/G/M, SERUM
IgA: <5 mg/dL — ABNORMAL LOW (ref 87–352)
IgE (Immunoglobulin E), Serum: 4 IU/mL — ABNORMAL LOW (ref 6–495)
IgG (Immunoglobin G), Serum: 1579 mg/dL (ref 586–1602)
IgM (Immunoglobulin M), Srm: 115 mg/dL (ref 26–217)

## 2021-07-01 ENCOUNTER — Inpatient Hospital Stay (HOSPITAL_BASED_OUTPATIENT_CLINIC_OR_DEPARTMENT_OTHER): Payer: BC Managed Care – PPO | Admitting: Oncology

## 2021-07-01 ENCOUNTER — Other Ambulatory Visit: Payer: Self-pay

## 2021-07-01 ENCOUNTER — Encounter: Payer: Self-pay | Admitting: Oncology

## 2021-07-01 DIAGNOSIS — M2550 Pain in unspecified joint: Secondary | ICD-10-CM

## 2021-07-01 DIAGNOSIS — K7469 Other cirrhosis of liver: Secondary | ICD-10-CM | POA: Diagnosis not present

## 2021-07-01 DIAGNOSIS — D751 Secondary polycythemia: Secondary | ICD-10-CM

## 2021-07-01 DIAGNOSIS — Z148 Genetic carrier of other disease: Secondary | ICD-10-CM | POA: Diagnosis not present

## 2021-07-01 NOTE — Progress Notes (Signed)
Connected with patient via telephone in preparation for her upcoming MyChart visit. Verified patient's identity with 2 different identifiers. Patient states that she continues to have headaches. She has no other complaints at this time.

## 2021-07-01 NOTE — Progress Notes (Signed)
HEMATOLOGY-ONCOLOGY TeleHEALTH VISIT PROGRESS NOTE  I connected with Tamara Dodson on 07/01/21  at  2:30 PM EDT by video enabled telemedicine visit and verified that I am speaking with the correct person using two identifiers. I discussed the limitations, risks, security and privacy concerns of performing an evaluation and management service by telemedicine and the availability of in-person appointments. The patient expressed understanding and agreed to proceed.   Other persons participating in the visit and their role in the encounter:  None  Patient's location: Home  Provider's location: office Chief Complaint: Iron overload, erythrocytosis, arthralgia   INTERVAL HISTORY Tamara Dodson is a 60 y.o. female who has above history reviewed by me today presents for follow up visit for management of Iron overload, erythrocytosis, arthralgia Problems and complaints are listed below:  No new complaints.  Chronic arthralgia.  Review of Systems  Constitutional:  Negative for appetite change, chills, fatigue and fever.  HENT:   Negative for hearing loss and voice change.   Eyes:  Negative for eye problems.  Respiratory:  Negative for chest tightness and cough.   Cardiovascular:  Negative for chest pain.  Gastrointestinal:  Negative for abdominal distention, abdominal pain and blood in stool.  Endocrine: Negative for hot flashes.  Genitourinary:  Negative for difficulty urinating and frequency.   Musculoskeletal:  Positive for arthralgias.  Skin:  Negative for itching and rash.  Neurological:  Negative for extremity weakness.  Hematological:  Negative for adenopathy.  Psychiatric/Behavioral:  Negative for confusion.    Past Medical History:  Diagnosis Date   Anemia    Arthritis    osteoarthrosis   Cirrhosis (HCC)    seeing Dr Cathie Hoops   Fatty liver    Hypertension    Iron overload 07/18/2019   Lumbago    Lymph edema    lower extremities   Morbid obesity [facility])    Past Surgical History:   Procedure Laterality Date   ANTERIOR CERVICAL DECOMP/DISCECTOMY FUSION N/A 02/24/2021   Procedure: ANTERIOR CERVICAL DECOMPRESSION/DISCECTOMY FUSION 2 LEVELS C3-5;  Surgeon: Venetia Night, MD;  Location: [facility] OR[facility];  Service: Neurosurgery;  Laterality: N/A;   CHOLECYSTECTOMY     ESOPHAGOGASTRODUODENOSCOPY N/A 01/25/2021   Procedure: ESOPHAGOGASTRODUODENOSCOPY (EGD);  Surgeon: Toledo, Boykin Nearing, MD;  Location: [facility];  Service: Gastroenterology;  Laterality: N/A;  C-19 test on 01/22/2021 Requesting to be the last case due to her transportation   JOINT REPLACEMENT Left    knee, hip   JOINT REPLACEMENT Bilateral    knee   KNEE ARTHROPLASTY Right 09/21/2015   Procedure: COMPUTER ASSISTED TOTAL KNEE ARTHROPLASTY;  Surgeon: Donato Heinz, MD;  Location: [facility] OR[facility];  Service: Orthopedics;  Laterality: Right;   TONSILLECTOMY     TOTAL HIP ARTHROPLASTY Right 11/17/2017   Procedure: TOTAL HIP ARTHROPLASTY;  Surgeon: Donato Heinz, MD;  Location: [facility] OR[facility];  Service: Orthopedics;  Laterality: Right;    Family History  Problem Relation Age of Onset   Breast cancer Mother        60's    Social History   Socioeconomic History   Marital status: Married    Spouse name: Casimiro Needle    Number of children: 3   Years of education: Not on file   Highest education level: Not on file  Occupational History   Not on file  Tobacco Use   Smoking status: Never   Smokeless tobacco: Never  Vaping Use   Vaping Use: Never used  Substance and Sexual Activity   Alcohol use: No  Drug use: No   Sexual activity: Yes  Other Topics Concern   Not on file  Social History Narrative   Not on file   Social Determinants of Health   Financial Resource Strain: Not on file  Food Insecurity: Not on file  Transportation Needs: Not on file  Physical Activity: Not on file  Stress: Not on file  Social Connections: Not on file  Intimate Partner Violence: Not on file    Current Outpatient Medications on File  Prior to Visit  Medication Sig Dispense Refill   acetaminophen (TYLENOL) 500 MG tablet Take 1,000 mg by mouth every 6 (six) hours as needed for moderate pain.     amLODipine (NORVASC) 5 MG tablet Take 5 mg by mouth every morning.     aspirin EC 81 MG tablet Take 81 mg daily by mouth.     celecoxib (CELEBREX) 100 MG capsule Take 100 mg by mouth 2 (two) times daily.     Cholecalciferol (VITAMIN D) 50 MCG (2000 UT) tablet Take 4,000 Units by mouth daily.     furosemide (LASIX) 20 MG tablet Take 40 mg by mouth daily.     gabapentin (NEURONTIN) 300 MG capsule Take by mouth.     Magnesium 400 MG TABS Take 400 mg by mouth 2 (two) times daily.     meclizine (ANTIVERT) 12.5 MG tablet Take 12.5mg  twice daily for dizziness.     methocarbamol (ROBAXIN) 500 MG tablet Take 1-2 tablets (500-1,000 mg total) by mouth every 6 (six) hours as needed for muscle spasms. 60 tablet 0   nortriptyline (PAMELOR) 10 MG capsule 3 (three) times daily.     pantoprazole (PROTONIX) 40 MG tablet Take 40 mg by mouth 2 (two) times daily.     Potassium 99 MG TABS Take 99 mg by mouth 2 (two) times daily.     vitamin C (ASCORBIC ACID) 500 MG tablet Take 1,000 mg by mouth daily.     zinc gluconate 50 MG tablet Take 100 mg by mouth daily.     No current facility-administered medications on file prior to visit.    Allergies  Allergen Reactions   Gluten Meal Diarrhea    bloating       Observations/Objective: There were no vitals filed for this visit. There is no height or weight on file to calculate BMI.  Physical Exam  CBC    Component Value Date/Time   WBC 7.8 06/25/2021 1447   RBC 4.73 06/25/2021 1447   HGB 14.5 06/25/2021 1447   HGB 13.8 02/20/2013 0723   HCT 44.8 06/25/2021 1447   HCT 46.7 02/05/2013 1406   PLT 224 06/25/2021 1447   PLT 185 02/20/2013 0723   MCV 94.7 06/25/2021 1447   MCV 92 02/05/2013 1406   MCH 30.7 06/25/2021 1447   MCHC 32.4 06/25/2021 1447   RDW 12.8 06/25/2021 1447   RDW 13.0  02/05/2013 1406   LYMPHSABS 1.0 06/25/2021 1447   MONOABS 0.6 06/25/2021 1447   EOSABS 0.1 06/25/2021 1447   BASOSABS 0.1 06/25/2021 1447    CMP     Component Value Date/Time   NA 136 02/12/2021 1018   NA 137 02/20/2013 0723   K 3.7 02/12/2021 1018   K 3.7 02/20/2013 0723   CL 101 02/12/2021 1018   CL 104 02/20/2013 0723   CO2 26 02/12/2021 1018   CO2 26 02/20/2013 0723   GLUCOSE 116 (H) 02/12/2021 1018   GLUCOSE 126 (H) 02/20/2013 0723   BUN 16 02/12/2021  1018   BUN 6 (L) 02/20/2013 0723   CREATININE 0.65 02/12/2021 1018   CREATININE 0.54 (L) 02/20/2013 0723   CALCIUM 9.8 02/12/2021 1018   CALCIUM 8.7 02/20/2013 0723   PROT 7.8 03/15/2021 1348   ALBUMIN 4.4 03/15/2021 1348   AST 38 03/15/2021 1348   ALT 25 03/15/2021 1348   ALKPHOS 76 03/15/2021 1348   BILITOT 0.6 03/15/2021 1348   GFRNONAA >60 02/12/2021 1018   GFRNONAA >60 02/20/2013 0723   GFRAA >60 12/11/2017 1121   GFRAA >60 02/20/2013 0723     Assessment and Plan: 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 Iron overload   3. Generalized joint pain   4. Other cirrhosis of liver (HCC)   5. Erythrocytosis   6. Arthralgia, unspecified joint     #Elevated ferritin in the context of heterozygous H53D mutation as well as liver fibrosis. 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 The iron stain highlights moderate staining in sinusoidal and periportal parenchymal patterns of distribution   Initially elevated ferritin 2278, and increased iron saturation 38. Status post phlebotomy.  Labs reviewed and discussed with patient.  Ferritin has decreased to 1422.  Iron saturation 19. Proceed with phlebotomy tomorrow. Elevated ferritin probably is a result of chronic inflammation, chronic liver disease. Recommend patient to avoid iron supplementation.  Stop vitamin C supplementation.  First-degree relative should consider screening.    Chronic  arthralgia pain, patient has elevated CRP.  Indicating underlying chronic inflammation.  Refer to rheumatology for further evaluation.  Decreased IgA level, patient denies any frequent sinopulmonary infection.  Monitor. Discussed about possible increase of anaphylactic reaction to blood products. Awaiting rheumatology evaluation.   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  Recommend her to discuss with PCP for sleep study. .  Follow Up Instructions: 3 months- (cbc,cmp,iron,ferr] lab virtual MD   I discussed the assessment and treatment plan with the patient. The patient was provided an opportunity to ask questions and all were answered. The patient agreed with the plan and demonstrated an understanding of the instructions.  The patient was advised to call back or seek an in-person evaluation if the symptoms worsen or if the condition fails to improve as anticipated.   Rickard Patience, MD 07/01/2021 10:06 PM

## 2021-07-02 ENCOUNTER — Inpatient Hospital Stay: Payer: BC Managed Care – PPO

## 2021-07-02 DIAGNOSIS — D751 Secondary polycythemia: Secondary | ICD-10-CM | POA: Diagnosis not present

## 2021-07-06 LAB — ANTINUCLEAR ANTIBODIES, IFA: ANA Ab, IFA: NEGATIVE

## 2021-08-03 ENCOUNTER — Other Ambulatory Visit: Payer: Self-pay | Admitting: Rheumatology

## 2021-08-03 DIAGNOSIS — K7469 Other cirrhosis of liver: Secondary | ICD-10-CM

## 2021-08-03 DIAGNOSIS — R7989 Other specified abnormal findings of blood chemistry: Secondary | ICD-10-CM

## 2021-08-03 DIAGNOSIS — I890 Lymphedema, not elsewhere classified: Secondary | ICD-10-CM

## 2021-09-02 ENCOUNTER — Ambulatory Visit
Admission: RE | Admit: 2021-09-02 | Discharge: 2021-09-02 | Disposition: A | Discharge status: Home / Self Care | Payer: BC Managed Care – PPO | Source: Ambulatory Visit | Attending: Rheumatology | Admitting: Rheumatology

## 2021-09-02 DIAGNOSIS — I890 Lymphedema, not elsewhere classified: Secondary | ICD-10-CM

## 2021-09-02 DIAGNOSIS — K7469 Other cirrhosis of liver: Secondary | ICD-10-CM

## 2021-09-02 DIAGNOSIS — R7989 Other specified abnormal findings of blood chemistry: Secondary | ICD-10-CM

## 2021-09-03 DIAGNOSIS — R200 Anesthesia of skin: Secondary | ICD-10-CM | POA: Insufficient documentation

## 2021-09-03 DIAGNOSIS — G5601 Carpal tunnel syndrome, right upper limb: Secondary | ICD-10-CM

## 2021-09-03 HISTORY — DX: Carpal tunnel syndrome, right upper limb: G56.01

## 2021-09-21 ENCOUNTER — Encounter: Payer: Self-pay | Admitting: Oncology

## 2021-09-29 ENCOUNTER — Inpatient Hospital Stay: Payer: BC Managed Care – PPO | Attending: Oncology

## 2021-09-29 ENCOUNTER — Other Ambulatory Visit: Payer: Self-pay

## 2021-09-29 DIAGNOSIS — D802 Selective deficiency of immunoglobulin A [IgA]: Secondary | ICD-10-CM | POA: Diagnosis not present

## 2021-09-29 DIAGNOSIS — K7460 Unspecified cirrhosis of liver: Secondary | ICD-10-CM | POA: Insufficient documentation

## 2021-09-29 DIAGNOSIS — Z148 Genetic carrier of other disease: Secondary | ICD-10-CM

## 2021-09-29 LAB — CBC WITH DIFFERENTIAL/PLATELET
Abs Immature Granulocytes: 0.05 10*3/uL (ref 0.00–0.07)
Basophils Absolute: 0.1 10*3/uL (ref 0.0–0.1)
Basophils Relative: 1 %
Eosinophils Absolute: 0.2 10*3/uL (ref 0.0–0.5)
Eosinophils Relative: 2 %
HCT: 46.1 % — ABNORMAL HIGH (ref 36.0–46.0)
Hemoglobin: 15.7 g/dL — ABNORMAL HIGH (ref 12.0–15.0)
Immature Granulocytes: 1 %
Lymphocytes Relative: 11 %
Lymphs Abs: 1 10*3/uL (ref 0.7–4.0)
MCH: 30.8 pg (ref 26.0–34.0)
MCHC: 34.1 g/dL (ref 30.0–36.0)
MCV: 90.6 fL (ref 80.0–100.0)
Monocytes Absolute: 0.7 10*3/uL (ref 0.1–1.0)
Monocytes Relative: 8 %
Neutro Abs: 7.2 10*3/uL (ref 1.7–7.7)
Neutrophils Relative %: 77 %
Platelets: 208 10*3/uL (ref 150–400)
RBC: 5.09 MIL/uL (ref 3.87–5.11)
RDW: 11.9 % (ref 11.5–15.5)
WBC: 9.2 10*3/uL (ref 4.0–10.5)
nRBC: 0 % (ref 0.0–0.2)

## 2021-09-29 LAB — COMPREHENSIVE METABOLIC PANEL
ALT: 25 U/L (ref 0–44)
AST: 42 U/L — ABNORMAL HIGH (ref 15–41)
Albumin: 4.3 g/dL (ref 3.5–5.0)
Alkaline Phosphatase: 93 U/L (ref 38–126)
Anion gap: 12 (ref 5–15)
BUN: 7 mg/dL (ref 6–20)
CO2: 25 mmol/L (ref 22–32)
Calcium: 8.5 mg/dL — ABNORMAL LOW (ref 8.9–10.3)
Chloride: 100 mmol/L (ref 98–111)
Creatinine, Ser: 0.67 mg/dL (ref 0.44–1.00)
GFR, Estimated: >60 mL/min (ref >60)
Glucose, Bld: 128 mg/dL — ABNORMAL HIGH (ref 70–99)
Potassium: 3.6 mmol/L (ref 3.5–5.1)
Sodium: 137 mmol/L (ref 135–145)
Total Bilirubin: 0.8 mg/dL (ref 0.3–1.2)
Total Protein: 8.3 g/dL — ABNORMAL HIGH (ref 6.5–8.1)

## 2021-09-29 LAB — IRON AND TIBC
Iron: 111 ug/dL (ref 28–170)
Saturation Ratios: 39 % — ABNORMAL HIGH (ref 10.4–31.8)
TIBC: 283 ug/dL (ref 250–450)
UIBC: 172 ug/dL

## 2021-09-29 LAB — FERRITIN: Ferritin: 993 ng/mL — ABNORMAL HIGH (ref 11–307)

## 2021-09-30 ENCOUNTER — Inpatient Hospital Stay (HOSPITAL_BASED_OUTPATIENT_CLINIC_OR_DEPARTMENT_OTHER): Payer: BC Managed Care – PPO | Admitting: Oncology

## 2021-09-30 ENCOUNTER — Telehealth: Admitting: Oncology

## 2021-09-30 ENCOUNTER — Encounter: Payer: Self-pay | Admitting: Oncology

## 2021-09-30 DIAGNOSIS — D802 Selective deficiency of immunoglobulin A [IgA]: Secondary | ICD-10-CM

## 2021-09-30 DIAGNOSIS — K7469 Other cirrhosis of liver: Secondary | ICD-10-CM

## 2021-09-30 DIAGNOSIS — D751 Secondary polycythemia: Secondary | ICD-10-CM

## 2021-09-30 NOTE — Progress Notes (Signed)
Pt confirmed availability for virtual visit as scheduled. Pt reports increasing fatigue but has no other concerns or complaints at this time.

## 2021-09-30 NOTE — Progress Notes (Signed)
HEMATOLOGY-ONCOLOGY TeleHEALTH VISIT PROGRESS NOTE  I connected with Tamara Dodson on 09/30/21  at  2:00 PM EDT by video enabled telemedicine visit and verified that I am speaking with the correct person using two identifiers. I discussed the limitations, risks, security and privacy concerns of performing an evaluation and management service by telemedicine and the availability of in-person appointments. The patient expressed understanding and agreed to proceed.   Other persons participating in the visit and their role in the encounter:  None  Patient's location: Home  Provider's location: office Chief Complaint: Iron overload, erythrocytosis, arthralgia   INTERVAL HISTORY Tamara Dodson is a 60 y.o. female who has above history reviewed by me today presents for follow up visit for management of Iron overload, erythrocytosis, arthralgia Problems and complaints are listed below:  Chronic arthralgia.  Denies any frequent infection, low-grade fever, unintentional weight loss. Increasing fatigue. Review of Systems  Constitutional:  Negative for appetite change, chills, fatigue and fever.  HENT:   Negative for hearing loss and voice change.   Eyes:  Negative for eye problems.  Respiratory:  Negative for chest tightness and cough.   Cardiovascular:  Negative for chest pain.  Gastrointestinal:  Negative for abdominal distention, abdominal pain and blood in stool.  Endocrine: Negative for hot flashes.  Genitourinary:  Negative for difficulty urinating and frequency.   Musculoskeletal:  Positive for arthralgias.  Skin:  Negative for itching and rash.  Neurological:  Negative for extremity weakness.  Hematological:  Negative for adenopathy.  Psychiatric/Behavioral:  Negative for confusion.    Past Medical History:  Diagnosis Date   Anemia    Arthritis    osteoarthrosis   Cirrhosis (HCC)    seeing Dr Cathie Hoops   Fatty liver    Hypertension    Iron overload 07/18/2019   Lumbago    Lymph edema     lower extremities   Morbid obesity [facility])    Past Surgical History:  Procedure Laterality Date   ANTERIOR CERVICAL DECOMP/DISCECTOMY FUSION N/A 02/24/2021   Procedure: ANTERIOR CERVICAL DECOMPRESSION/DISCECTOMY FUSION 2 LEVELS C3-5;  Surgeon: Venetia Night, MD;  Location: [facility] OR[facility];  Service: Neurosurgery;  Laterality: N/A;   CHOLECYSTECTOMY     ESOPHAGOGASTRODUODENOSCOPY N/A 01/25/2021   Procedure: ESOPHAGOGASTRODUODENOSCOPY (EGD);  Surgeon: Toledo, Boykin Nearing, MD;  Location: [facility];  Service: Gastroenterology;  Laterality: N/A;  C-19 test on 01/22/2021 Requesting to be the last case due to her transportation   JOINT REPLACEMENT Left    knee, hip   JOINT REPLACEMENT Bilateral    knee   KNEE ARTHROPLASTY Right 09/21/2015   Procedure: COMPUTER ASSISTED TOTAL KNEE ARTHROPLASTY;  Surgeon: Donato Heinz, MD;  Location: [facility];  Service: Orthopedics;  Laterality: Right;   TONSILLECTOMY     TOTAL HIP ARTHROPLASTY Right 11/17/2017   Procedure: TOTAL HIP ARTHROPLASTY;  Surgeon: Donato Heinz, MD;  Location: [facility];  Service: Orthopedics;  Laterality: Right;    Family History  Problem Relation Age of Onset   Breast cancer Mother        89's    Social History   Socioeconomic History   Marital status: Married    Spouse name: Casimiro Needle    Number of children: 3   Years of education: Not on file   Highest education level: Not on file  Occupational History   Not on file  Tobacco Use   Smoking status: Never   Smokeless tobacco: Never  Vaping Use   Vaping Use: Never used  Substance and  Sexual Activity   Alcohol use: No   Drug use: No   Sexual activity: Yes  Other Topics Concern   Not on file  Social History Narrative   Not on file   Social Determinants of Health   Financial Resource Strain: Not on file  Food Insecurity: Not on file  Transportation Needs: Not on file  Physical Activity: Not on file  Stress: Not on file  Social Connections: Not on file  Intimate  Partner Violence: Not on file    Current Outpatient Medications on File Prior to Visit  Medication Sig Dispense Refill   acetaminophen (TYLENOL) 500 MG tablet Take 1,000 mg by mouth every 6 (six) hours as needed for moderate pain.     amLODipine (NORVASC) 5 MG tablet Take 5 mg by mouth every morning.     aspirin EC 81 MG tablet Take 81 mg daily by mouth.     celecoxib (CELEBREX) 100 MG capsule Take 100 mg by mouth 2 (two) times daily.     Cholecalciferol (VITAMIN D) 50 MCG (2000 UT) tablet Take 4,000 Units by mouth daily.     furosemide (LASIX) 20 MG tablet Take 40 mg by mouth daily.     gabapentin (NEURONTIN) 300 MG capsule Take by mouth.     Magnesium 400 MG TABS Take 400 mg by mouth 2 (two) times daily.     meclizine (ANTIVERT) 12.5 MG tablet Take 12.5mg  twice daily for dizziness.     methocarbamol (ROBAXIN) 500 MG tablet Take 1-2 tablets (500-1,000 mg total) by mouth every 6 (six) hours as needed for muscle spasms. 60 tablet 0   nortriptyline (PAMELOR) 10 MG capsule 3 (three) times daily.     pantoprazole (PROTONIX) 40 MG tablet Take 40 mg by mouth 2 (two) times daily.     Potassium 99 MG TABS Take 99 mg by mouth 2 (two) times daily.     zinc gluconate 50 MG tablet Take 100 mg by mouth daily.     vitamin C (ASCORBIC ACID) 500 MG tablet Take 1,000 mg by mouth daily. (Patient not taking: Reported on 09/30/2021)     No current facility-administered medications on file prior to visit.    Allergies  Allergen Reactions   Gluten Meal Diarrhea    bloating       Observations/Objective: Today's Vitals   09/30/21 1235  PainSc: 0-No pain   There is no height or weight on file to calculate BMI.  Physical Exam Neurological:     Mental Status: She is alert.    CBC    Component Value Date/Time   WBC 9.2 09/29/2021 1429   RBC 5.09 09/29/2021 1429   HGB 15.7 (H) 09/29/2021 1429   HGB 13.8 02/20/2013 0723   HCT 46.1 (H) 09/29/2021 1429   HCT 46.7 02/05/2013 1406   PLT 208 09/29/2021  1429   PLT 185 02/20/2013 0723   MCV 90.6 09/29/2021 1429   MCV 92 02/05/2013 1406   MCH 30.8 09/29/2021 1429   MCHC 34.1 09/29/2021 1429   RDW 11.9 09/29/2021 1429   RDW 13.0 02/05/2013 1406   LYMPHSABS 1.0 09/29/2021 1429   MONOABS 0.7 09/29/2021 1429   EOSABS 0.2 09/29/2021 1429   BASOSABS 0.1 09/29/2021 1429    CMP     Component Value Date/Time   NA 137 09/29/2021 1429   NA 137 02/20/2013 0723   K 3.6 09/29/2021 1429   K 3.7 02/20/2013 0723   CL 100 09/29/2021 1429   CL 104  02/20/2013 0723   CO2 25 09/29/2021 1429   CO2 26 02/20/2013 0723   GLUCOSE 128 (H) 09/29/2021 1429   GLUCOSE 126 (H) 02/20/2013 0723   BUN 7 09/29/2021 1429   BUN 6 (L) 02/20/2013 0723   CREATININE 0.67 09/29/2021 1429   CREATININE 0.54 (L) 02/20/2013 0723   CALCIUM 8.5 (L) 09/29/2021 1429   CALCIUM 8.7 02/20/2013 0723   PROT 8.3 (H) 09/29/2021 1429   ALBUMIN 4.3 09/29/2021 1429   AST 42 (H) 09/29/2021 1429   ALT 25 09/29/2021 1429   ALKPHOS 93 09/29/2021 1429   BILITOT 0.8 09/29/2021 1429   GFRNONAA >60 09/29/2021 1429   GFRNONAA >60 02/20/2013 0723   GFRAA >60 12/11/2017 1121   GFRAA >60 02/20/2013 0723     Assessment and Plan: 1. IgA deficiency (HCC)   2. Iron overload   3. Other cirrhosis of liver (HCC)   4. Erythrocytosis     #Elevated ferritin in the context of heterozygous H53D mutation as well as liver fibrosis. 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 The iron stain highlights moderate staining in sinusoidal and periportal parenchymal patterns of distribution   Initially elevated ferritin 2278, and increased iron saturation 38. Patient has been on phlebotomy. Labs reviewed and discussed with patient. Elevated ferritin/iron saturation is a result of chronic inflammation, liver disease and may be underlying iron overload. Proceed with phlebotomy next week.Marland Kitchen  Avoid iron supplementation/vitamin C  supplementation/alcohol.   Chronic arthralgia pain, patient has elevated CRP.  Patient was seen by Dr. Gavin Potters.  No clear inflammatory disease identified.  Decreased IgA level, patient denies any frequent sinopulmonary infection.  Monitor.  We will check IgA level in the future. Discussed about possible increase of anaphylactic reaction to blood products.   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  Need a sleep study.. .  Follow Up Instructions: 1 month- (cbc,cmp,iron,ferr] lab MD   I discussed the assessment and treatment plan with the patient. The patient was provided an opportunity to ask questions and all were answered. The patient agreed with the plan and demonstrated an understanding of the instructions.  The patient was advised to call back or seek an in-person evaluation if the symptoms worsen or if the condition fails to improve as anticipated.   Rickard Patience, MD 09/30/2021 10:51 PM

## 2021-10-07 ENCOUNTER — Other Ambulatory Visit: Payer: Self-pay

## 2021-10-07 ENCOUNTER — Encounter: Payer: Self-pay | Admitting: Oncology

## 2021-10-07 ENCOUNTER — Inpatient Hospital Stay: Payer: BC Managed Care – PPO

## 2021-10-07 NOTE — Progress Notes (Signed)
500 ml of blood removed per phlebotomy orders. Patient tolerated well. VSS. Discharged to home.

## 2021-10-07 NOTE — Patient Instructions (Signed)

## 2021-10-20 ENCOUNTER — Encounter: Payer: Self-pay | Admitting: Oncology

## 2021-11-03 ENCOUNTER — Inpatient Hospital Stay: Payer: BC Managed Care – PPO | Attending: Oncology

## 2021-11-03 ENCOUNTER — Other Ambulatory Visit: Payer: Self-pay

## 2021-11-03 DIAGNOSIS — M1990 Unspecified osteoarthritis, unspecified site: Secondary | ICD-10-CM | POA: Insufficient documentation

## 2021-11-03 DIAGNOSIS — K219 Gastro-esophageal reflux disease without esophagitis: Secondary | ICD-10-CM | POA: Insufficient documentation

## 2021-11-03 DIAGNOSIS — D751 Secondary polycythemia: Secondary | ICD-10-CM | POA: Diagnosis present

## 2021-11-03 DIAGNOSIS — D802 Selective deficiency of immunoglobulin A [IgA]: Secondary | ICD-10-CM

## 2021-11-03 LAB — CBC WITH DIFFERENTIAL/PLATELET
Abs Immature Granulocytes: 0.03 10*3/uL (ref 0.00–0.07)
Basophils Absolute: 0.1 10*3/uL (ref 0.0–0.1)
Basophils Relative: 1 %
Eosinophils Absolute: 0.1 10*3/uL (ref 0.0–0.5)
Eosinophils Relative: 2 %
HCT: 44.4 % (ref 36.0–46.0)
Hemoglobin: 14.9 g/dL (ref 12.0–15.0)
Immature Granulocytes: 0 %
Lymphocytes Relative: 16 %
Lymphs Abs: 1.2 10*3/uL (ref 0.7–4.0)
MCH: 31.4 pg (ref 26.0–34.0)
MCHC: 33.6 g/dL (ref 30.0–36.0)
MCV: 93.7 fL (ref 80.0–100.0)
Monocytes Absolute: 0.6 10*3/uL (ref 0.1–1.0)
Monocytes Relative: 8 %
Neutro Abs: 5.7 10*3/uL (ref 1.7–7.7)
Neutrophils Relative %: 73 %
Platelets: 194 10*3/uL (ref 150–400)
RBC: 4.74 MIL/uL (ref 3.87–5.11)
RDW: 12.6 % (ref 11.5–15.5)
WBC: 7.7 10*3/uL (ref 4.0–10.5)
nRBC: 0 % (ref 0.0–0.2)

## 2021-11-03 LAB — IRON AND TIBC
Iron: 90 ug/dL (ref 28–170)
Saturation Ratios: 30 % (ref 10.4–31.8)
TIBC: 304 ug/dL (ref 250–450)
UIBC: 214 ug/dL

## 2021-11-03 LAB — FERRITIN: Ferritin: 1197 ng/mL — ABNORMAL HIGH (ref 11–307)

## 2021-11-03 NOTE — Progress Notes (Signed)
MRN : [id number]  Tamara Dodson is a 60 y.o. (1960-12-21) female who presents with chief complaint of check leg swelling.  History of Present Illness:  The patient returns to the office for followup evaluation regarding leg swelling.  The swelling has persisted but with the lymph pump is much, much better controlled. The pain associated with swelling is essentially eliminated. There have not been any interval development of a ulcerations or wounds.  The patient denies problems with the pump, noting it is working well and the leggings are in good condition.  Since the previous visit the patient has been wearing graduated compression stockings and using the lymph pump on a routine basis and  has noted significant improvement in the lymphedema.   Patient stated the lymph pump has been a very positive factor in her care.     No outpatient medications have been marked as taking for the 11/04/21 encounter (Appointment) with Delana Meyer, Dolores Lory, MD.    Past Medical History:  Diagnosis Date   Anemia    Arthritis    osteoarthrosis   Cirrhosis ([place])    seeing Dr Tasia Catchings   Fatty liver    Hypertension    Iron overload 07/18/2019   Lumbago    Lymph edema    lower extremities   Morbid obesity [facility])     Past Surgical History:  Procedure Laterality Date   ANTERIOR CERVICAL DECOMP/DISCECTOMY FUSION N/A 02/24/2021   Procedure: ANTERIOR CERVICAL DECOMPRESSION/DISCECTOMY FUSION 2 LEVELS C3-5;  Surgeon: Meade Maw, MD;  Location: [facility] ORS;  Service: Neurosurgery;  Laterality: N/A;   CHOLECYSTECTOMY     ESOPHAGOGASTRODUODENOSCOPY N/A 01/25/2021   Procedure: ESOPHAGOGASTRODUODENOSCOPY (EGD);  Surgeon: Toledo, [name], MD;  Location: [facility] ENDOSCOPY;  Service: Gastroenterology;  Laterality: N/A;  C-19 test on 01/22/2021 Requesting to be the last case due to her transportation   JOINT REPLACEMENT Left    knee, hip   JOINT REPLACEMENT Bilateral    knee   KNEE ARTHROPLASTY Right 09/21/2015    Procedure: COMPUTER ASSISTED TOTAL KNEE ARTHROPLASTY;  Surgeon: Dereck Leep, MD;  Location: [facility] ORS;  Service: Orthopedics;  Laterality: Right;   TONSILLECTOMY     TOTAL HIP ARTHROPLASTY Right 11/17/2017   Procedure: TOTAL HIP ARTHROPLASTY;  Surgeon: Dereck Leep, MD;  Location: [facility] ORS;  Service: Orthopedics;  Laterality: Right;    Social History Social History   Tobacco Use   Smoking status: Never   Smokeless tobacco: Never  Vaping Use   Vaping Use: Never used  Substance Use Topics   Alcohol use: No   Drug use: No    Family History Family History  Problem Relation Age of Onset   Breast cancer Mother        45's    Allergies  Allergen Reactions   Gluten Meal Diarrhea    bloating     REVIEW OF SYSTEMS (Negative unless checked)  Constitutional: [] Weight loss  [] Fever  [] Chills Cardiac: [] Chest pain   [] Chest pressure   [] Palpitations   [] Shortness of breath when laying flat   [] Shortness of breath with exertion. Vascular:  [] Pain in legs with walking   [] Pain in legs at rest  [] History of DVT   [] Phlebitis   [x] Swelling in legs   [] Varicose veins   [] Non-healing ulcers Pulmonary:   [] Uses home oxygen   [] Productive cough   [] Hemoptysis   [] Wheeze  [] COPD   [] Asthma Neurologic:  [] Dizziness   [] Seizures   [] History of stroke   [] History  of TIA  [] Aphasia   [] Vissual changes   [] Weakness or numbness in arm   [] Weakness or numbness in leg Musculoskeletal:   [] Joint swelling   [x] Joint pain   [x] Low back pain Hematologic:  [] Easy bruising  [] Easy bleeding   [] Hypercoagulable state   [] Anemic Gastrointestinal:  [] Diarrhea   [] Vomiting  [x] Gastroesophageal reflux/heartburn   [] Difficulty swallowing. Genitourinary:  [] Chronic kidney disease   [] Difficult urination  [] Frequent urination   [] Blood in urine Skin:  [] Rashes   [] Ulcers  Psychological:  [] History of anxiety   []  History of major depression.  Physical Examination  There were no vitals filed for this  visit. There is no height or weight on file to calculate BMI. Gen: WD/WN, NAD Head: [place]/AT, No temporalis wasting.  Ear/Nose/Throat: Hearing grossly intact, nares w/o erythema or drainage, pinna without lesions Eyes: PER, EOMI, sclera nonicteric.  Neck: Supple, no gross masses.  No JVD.  Pulmonary:  Good air movement, no audible wheezing, no use of accessory muscles.  Cardiac: RRR, precordium not hyperdynamic. Vascular:  scattered varicosities present bilaterally.  Mild venous stasis changes to the legs bilaterally.  2+ soft pitting edema  Vessel Right Left  Radial Palpable Palpable  Gastrointestinal: soft, non-distended. No guarding/no peritoneal signs.  Musculoskeletal: M/S 5/5 throughout.  No deformity.  Neurologic: CN 2-12 intact. Pain and light touch intact in extremities.  Symmetrical.  Speech is fluent. Motor exam as listed above. Psychiatric: Judgment intact, Mood & affect appropriate for pt's clinical situation. Dermatologic: Venous rashes no ulcers noted.  No changes consistent with cellulitis. Lymph : No lichenification or skin changes of chronic lymphedema.  CBC Lab Results  Component Value Date   WBC 7.7 11/03/2021   HGB 14.9 11/03/2021   HCT 44.4 11/03/2021   MCV 93.7 11/03/2021   PLT 194 11/03/2021    BMET    Component Value Date/Time   NA 137 09/29/2021 1429   NA 137 02/20/2013 0723   K 3.6 09/29/2021 1429   K 3.7 02/20/2013 0723   CL 100 09/29/2021 1429   CL 104 02/20/2013 0723   CO2 25 09/29/2021 1429   CO2 26 02/20/2013 0723   GLUCOSE 128 (H) 09/29/2021 1429   GLUCOSE 126 (H) 02/20/2013 0723   BUN 7 09/29/2021 1429   BUN 6 (L) 02/20/2013 0723   CREATININE 0.67 09/29/2021 1429   CREATININE 0.54 (L) 02/20/2013 0723   CALCIUM 8.5 (L) 09/29/2021 1429   CALCIUM 8.7 02/20/2013 0723   GFRNONAA >60 09/29/2021 1429   GFRNONAA >60 02/20/2013 0723   GFRAA >60 12/11/2017 1121   GFRAA >60 02/20/2013 0723   CrCl cannot be calculated (Patient's most recent lab  result is older than the maximum 21 days allowed.).  COAG Lab Results  Component Value Date   INR 1.0 02/12/2021   INR 1.0 09/23/2019   INR 0.92 11/02/2017    Radiology No results found.   Assessment/Plan 1. Lymphedema in adult patient  No surgery or intervention at this point in time.    I have reviewed my discussion with the patient regarding lymphedema and why it  causes symptoms.  Patient will continue wearing graduated compression stockings class 1 (20-30 mmHg) on a daily basis a prescription was given. The patient is reminded to put the stockings on first thing in the morning and removing them in the evening. The patient is instructed specifically not to sleep in the stockings.   In addition, behavioral modification throughout the day will be continued.  This will include  frequent elevation (such as in a recliner), use of over the counter pain medications as needed and exercise such as walking.  I have reviewed systemic causes for chronic edema such as liver, kidney and cardiac etiologies and there does not appear to be any significant changes in these organ systems over the past year.  The patient is under the impression that these organ systems are all stable and unchanged.    The patient will continue aggressive use of the  lymph pump.  This will continue to improve the edema control and prevent sequela such as ulcers and infections.   The patient will follow-up with me on an annual basis.    2. Benign essential hypertension Continue antihypertensive medications as already ordered, these medications have been reviewed and there are no changes at this time.   3. Primary osteoarthritis involving multiple joints Continue NSAID medications as already ordered, these medications have been reviewed and there are no changes at this time.  Continued activity and therapy was stressed.   4. Gastroesophageal reflux disease without esophagitis Continue PPI as already ordered, this  medication has been reviewed and there are no changes at this time.  Avoidence of caffeine and alcohol  Moderate elevation of the head of the bed      Hortencia Pilar, MD  11/03/2021 2:04 PM

## 2021-11-04 ENCOUNTER — Ambulatory Visit (INDEPENDENT_AMBULATORY_CARE_PROVIDER_SITE_OTHER): Payer: BC Managed Care – PPO | Admitting: Vascular Surgery

## 2021-11-04 ENCOUNTER — Encounter (INDEPENDENT_AMBULATORY_CARE_PROVIDER_SITE_OTHER): Payer: Self-pay | Admitting: Vascular Surgery

## 2021-11-04 ENCOUNTER — Encounter: Payer: Self-pay | Admitting: Oncology

## 2021-11-04 ENCOUNTER — Inpatient Hospital Stay (HOSPITAL_BASED_OUTPATIENT_CLINIC_OR_DEPARTMENT_OTHER): Payer: BC Managed Care – PPO | Admitting: Oncology

## 2021-11-04 VITALS — BP 153/82 | HR 88 | Resp 16 | Wt 319.0 lb

## 2021-11-04 DIAGNOSIS — M159 Polyosteoarthritis, unspecified: Secondary | ICD-10-CM

## 2021-11-04 DIAGNOSIS — I890 Lymphedema, not elsewhere classified: Secondary | ICD-10-CM | POA: Diagnosis not present

## 2021-11-04 DIAGNOSIS — I10 Essential (primary) hypertension: Secondary | ICD-10-CM

## 2021-11-04 DIAGNOSIS — K219 Gastro-esophageal reflux disease without esophagitis: Secondary | ICD-10-CM

## 2021-11-04 DIAGNOSIS — D751 Secondary polycythemia: Secondary | ICD-10-CM | POA: Diagnosis not present

## 2021-11-04 NOTE — Progress Notes (Signed)
Patient contacted for Mychart visit. No new concerns voiced.  

## 2021-11-04 NOTE — Progress Notes (Signed)
HEMATOLOGY-ONCOLOGY TeleHEALTH VISIT PROGRESS NOTE  I connected with Tamara Dodson on 11/04/21  at  2:45 PM EST by video enabled telemedicine visit and verified that I am speaking with the correct person using two identifiers. I discussed the limitations, risks, security and privacy concerns of performing an evaluation and management service by telemedicine and the availability of in-person appointments. The patient expressed understanding and agreed to proceed.   Other persons participating in the visit and their role in the encounter:  None  Patient's location: Home  Provider's location: office Chief Complaint: Iron overload, erythrocytosis, arthralgia   INTERVAL HISTORY Tamara Dodson is a 60 y.o. female who has above history reviewed by me today presents for follow up visit for management of Iron overload, erythrocytosis, arthralgia Patient has been on intermittent phlebotomy program. No new complaints. Review of Systems  Constitutional:  Negative for appetite change, chills, fatigue and fever.  HENT:   Negative for hearing loss and voice change.   Eyes:  Negative for eye problems.  Respiratory:  Negative for chest tightness and cough.   Cardiovascular:  Negative for chest pain.  Gastrointestinal:  Negative for abdominal distention, abdominal pain and blood in stool.  Endocrine: Negative for hot flashes.  Genitourinary:  Negative for difficulty urinating and frequency.   Musculoskeletal:  Positive for arthralgias.  Skin:  Negative for itching and rash.  Neurological:  Negative for extremity weakness.  Hematological:  Negative for adenopathy.  Psychiatric/Behavioral:  Negative for confusion.    Past Medical History:  Diagnosis Date   Anemia    Arthritis    osteoarthrosis   Cirrhosis ([facility])    seeing Dr Cathie Hoops   Fatty liver    Hypertension    Iron overload 07/18/2019   Lumbago    Lymph edema    lower extremities   Morbid obesity [facility])    Past Surgical History:  Procedure  Laterality Date   ANTERIOR CERVICAL DECOMP/DISCECTOMY FUSION N/A 02/24/2021   Procedure: ANTERIOR CERVICAL DECOMPRESSION/DISCECTOMY FUSION 2 LEVELS C3-5;  Surgeon: Venetia Night, MD;  Location: [facility] ORS;  Service: Neurosurgery;  Laterality: N/A;   CHOLECYSTECTOMY     ESOPHAGOGASTRODUODENOSCOPY N/A 01/25/2021   Procedure: ESOPHAGOGASTRODUODENOSCOPY (EGD);  Surgeon: Toledo, Boykin Nearing, MD;  Location: [facility];  Service: Gastroenterology;  Laterality: N/A;  C-19 test on 01/22/2021 Requesting to be the last case due to her transportation   JOINT REPLACEMENT Left    knee, hip   JOINT REPLACEMENT Bilateral    knee   KNEE ARTHROPLASTY Right 09/21/2015   Procedure: COMPUTER ASSISTED TOTAL KNEE ARTHROPLASTY;  Surgeon: Donato Heinz, MD;  Location: [facility] ORS;  Service: Orthopedics;  Laterality: Right;   TONSILLECTOMY     TOTAL HIP ARTHROPLASTY Right 11/17/2017   Procedure: TOTAL HIP ARTHROPLASTY;  Surgeon: Donato Heinz, MD;  Location: [facility] ORS;  Service: Orthopedics;  Laterality: Right;    Family History  Problem Relation Age of Onset   Breast cancer Mother        68's    Social History   Socioeconomic History   Marital status: Married    Spouse name: Casimiro Needle    Number of children: 3   Years of education: Not on file   Highest education level: Not on file  Occupational History   Not on file  Tobacco Use   Smoking status: Never   Smokeless tobacco: Never  Vaping Use   Vaping Use: Never used  Substance and Sexual Activity   Alcohol use: No   Drug use:  No   Sexual activity: Yes  Other Topics Concern   Not on file  Social History Narrative   Not on file   Social Determinants of Health   Financial Resource Strain: Not on file  Food Insecurity: Not on file  Transportation Needs: Not on file  Physical Activity: Not on file  Stress: Not on file  Social Connections: Not on file  Intimate Partner Violence: Not on file    Current Outpatient Medications on File Prior to Visit   Medication Sig Dispense Refill   acetaminophen (TYLENOL) 500 MG tablet Take 1,000 mg by mouth every 6 (six) hours as needed for moderate pain.     amLODipine (NORVASC) 5 MG tablet Take 5 mg by mouth every morning.     aspirin EC 81 MG tablet Take 81 mg daily by mouth.     celecoxib (CELEBREX) 100 MG capsule Take 100 mg by mouth 2 (two) times daily.     Cholecalciferol (VITAMIN D) 50 MCG (2000 UT) tablet Take 4,000 Units by mouth daily.     furosemide (LASIX) 20 MG tablet Take 40 mg by mouth daily.     gabapentin (NEURONTIN) 300 MG capsule Take 300 mg by mouth daily.     Magnesium 400 MG TABS Take 400 mg by mouth 2 (two) times daily.     meclizine (ANTIVERT) 12.5 MG tablet Take 12.5mg  twice daily for dizziness.     methocarbamol (ROBAXIN) 500 MG tablet Take 1-2 tablets (500-1,000 mg total) by mouth every 6 (six) hours as needed for muscle spasms. 60 tablet 0   nortriptyline (PAMELOR) 10 MG capsule 3 (three) times daily.     pantoprazole (PROTONIX) 40 MG tablet Take 40 mg by mouth 2 (two) times daily.     Potassium 99 MG TABS Take 99 mg by mouth 2 (two) times daily.     propranolol (INDERAL) 20 MG tablet Take 10 mg by mouth in the morning and at bedtime.     zinc gluconate 50 MG tablet Take 100 mg by mouth daily.     No current facility-administered medications on file prior to visit.    Allergies  Allergen Reactions   Gluten Meal Diarrhea    bloating       Observations/Objective: There were no vitals filed for this visit.  There is no height or weight on file to calculate BMI.  Physical Exam Neurological:     Mental Status: She is alert.    CBC    Component Value Date/Time   WBC 7.7 11/03/2021 1110   RBC 4.74 11/03/2021 1110   HGB 14.9 11/03/2021 1110   HGB 13.8 02/20/2013 0723   HCT 44.4 11/03/2021 1110   HCT 46.7 02/05/2013 1406   PLT 194 11/03/2021 1110   PLT 185 02/20/2013 0723   MCV 93.7 11/03/2021 1110   MCV 92 02/05/2013 1406   MCH 31.4 11/03/2021 1110   MCHC  33.6 11/03/2021 1110   RDW 12.6 11/03/2021 1110   RDW 13.0 02/05/2013 1406   LYMPHSABS 1.2 11/03/2021 1110   MONOABS 0.6 11/03/2021 1110   EOSABS 0.1 11/03/2021 1110   BASOSABS 0.1 11/03/2021 1110    CMP     Component Value Date/Time   NA 137 09/29/2021 1429   NA 137 02/20/2013 0723   K 3.6 09/29/2021 1429   K 3.7 02/20/2013 0723   CL 100 09/29/2021 1429   CL 104 02/20/2013 0723   CO2 25 09/29/2021 1429   CO2 26 02/20/2013 0723  GLUCOSE 128 (H) 09/29/2021 1429   GLUCOSE 126 (H) 02/20/2013 0723   BUN 7 09/29/2021 1429   BUN 6 (L) 02/20/2013 0723   CREATININE 0.67 09/29/2021 1429   CREATININE 0.54 (L) 02/20/2013 0723   CALCIUM 8.5 (L) 09/29/2021 1429   CALCIUM 8.7 02/20/2013 0723   PROT 8.3 (H) 09/29/2021 1429   ALBUMIN 4.3 09/29/2021 1429   AST 42 (H) 09/29/2021 1429   ALT 25 09/29/2021 1429   ALKPHOS 93 09/29/2021 1429   BILITOT 0.8 09/29/2021 1429   GFRNONAA >60 09/29/2021 1429   GFRNONAA >60 02/20/2013 0723   GFRAA >60 12/11/2017 1121   GFRAA >60 02/20/2013 0723     Assessment and Plan: 1. Iron overload     #Elevated ferritin in the context of heterozygous H53D mutation as well as liver fibrosis. 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 The iron stain highlights moderate staining in sinusoidal and periportal parenchymal patterns of distribution  Patient has been on phlebotomy.  Labs reviewed and discussed with patient.  Ferritin trended up.  Iron saturation improved to 30. Elevated ferritin/iron saturation is a result of chronic inflammation, liver disease and may be underlying iron overload. Avoid iron supplementation/vitamin C supplementation/alcohol. Recommend weekly phlebotomy x4 followed by every 2 weeks phlebotomy.   Chronic arthralgia pain, patient has elevated CRP.  Patient was seen by Dr. Jefm Bryant.  No clear inflammatory disease identified.  Decreased IgA level, patient  denies any frequent sinopulmonary infection.  Monitor.  We will check IgA level in the future. Discussed about possible increase of anaphylactic reaction to blood products.   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  I suspect that she may have secondary erythrocytosis due to sleep apnea.  Need a sleep study..  Hemoglobin has improved. .  Follow Up Instructions: Phlebotomy weekly x 4, followed with phlebotomy every 2 weeks x 2. Labs every 2 weeks for monitoring.  Labs in 10 weeks (h&h,iron,ferr) MD/phlebotomy 1-2 days after   I discussed the assessment and treatment plan with the patient. The patient was provided an opportunity to ask questions and all were answered. The patient agreed with the plan and demonstrated an understanding of the instructions.  The patient was advised to call back or seek an in-person evaluation if the symptoms worsen or if the condition fails to improve as anticipated.   Earlie Server, MD 11/04/2021 10:41 PM

## 2021-11-05 ENCOUNTER — Encounter: Payer: Self-pay | Admitting: Oncology

## 2021-11-07 ENCOUNTER — Encounter (INDEPENDENT_AMBULATORY_CARE_PROVIDER_SITE_OTHER): Payer: Self-pay | Admitting: Vascular Surgery

## 2021-11-09 ENCOUNTER — Encounter: Payer: Self-pay | Admitting: Oncology

## 2021-11-09 ENCOUNTER — Other Ambulatory Visit: Payer: Self-pay

## 2021-11-09 ENCOUNTER — Inpatient Hospital Stay: Payer: BC Managed Care – PPO

## 2021-11-09 DIAGNOSIS — D751 Secondary polycythemia: Secondary | ICD-10-CM | POA: Diagnosis not present

## 2021-11-09 NOTE — Patient Instructions (Signed)

## 2021-11-09 NOTE — Progress Notes (Signed)
500 ml phlebotomy performed per parameters. Pt tolerated procedure well. VSS, discharged to home feeling well.

## 2021-11-16 ENCOUNTER — Telehealth: Payer: Self-pay | Admitting: Oncology

## 2021-11-16 NOTE — Telephone Encounter (Signed)
Pt called to reschedule appt. Please give her a call back at [phone]

## 2021-11-17 ENCOUNTER — Inpatient Hospital Stay: Payer: BC Managed Care – PPO

## 2021-11-17 ENCOUNTER — Other Ambulatory Visit: Payer: Self-pay

## 2021-11-17 DIAGNOSIS — D802 Selective deficiency of immunoglobulin A [IgA]: Secondary | ICD-10-CM

## 2021-11-17 DIAGNOSIS — D751 Secondary polycythemia: Secondary | ICD-10-CM | POA: Diagnosis not present

## 2021-11-17 LAB — FERRITIN: Ferritin: 946 ng/mL — ABNORMAL HIGH (ref 11–307)

## 2021-11-17 LAB — IRON AND TIBC
Iron: 74 ug/dL (ref 28–170)
Saturation Ratios: 24 % (ref 10.4–31.8)
TIBC: 307 ug/dL (ref 250–450)
UIBC: 233 ug/dL

## 2021-11-17 LAB — HEMOGLOBIN AND HEMATOCRIT, BLOOD
HCT: 41.6 % (ref 36.0–46.0)
Hemoglobin: 14.2 g/dL (ref 12.0–15.0)

## 2021-11-17 NOTE — Patient Instructions (Signed)

## 2021-11-17 NOTE — Progress Notes (Signed)
Patient tolatered phlebotomy well today. 500 cc removed as ordered. No concerns voiced. Patient discharged, stable.

## 2021-11-19 LAB — IMMUNOGLOBULINS A/E/G/M, SERUM
IgA: <5 mg/dL — ABNORMAL LOW (ref 87–352)
IgE (Immunoglobulin E), Serum: 5 IU/mL — ABNORMAL LOW (ref 6–495)
IgG (Immunoglobin G), Serum: 2032 mg/dL — ABNORMAL HIGH (ref 586–1602)
IgM (Immunoglobulin M), Srm: 145 mg/dL (ref 26–217)

## 2021-11-23 ENCOUNTER — Other Ambulatory Visit: Payer: Self-pay | Admitting: Neurosurgery

## 2021-11-23 ENCOUNTER — Inpatient Hospital Stay: Payer: BC Managed Care – PPO | Attending: Oncology

## 2021-11-23 DIAGNOSIS — Z981 Arthrodesis status: Secondary | ICD-10-CM

## 2021-11-23 DIAGNOSIS — G8929 Other chronic pain: Secondary | ICD-10-CM

## 2021-11-23 DIAGNOSIS — M5412 Radiculopathy, cervical region: Secondary | ICD-10-CM

## 2021-11-23 DIAGNOSIS — M25512 Pain in left shoulder: Secondary | ICD-10-CM

## 2021-11-30 ENCOUNTER — Inpatient Hospital Stay: Payer: BC Managed Care – PPO

## 2021-11-30 ENCOUNTER — Telehealth: Payer: Self-pay | Admitting: Oncology

## 2021-11-30 NOTE — Telephone Encounter (Signed)
Pt call to reschedule her 12-13 appt. Wants to know if she can come on 12-14. Call back at [phone]

## 2021-12-02 ENCOUNTER — Inpatient Hospital Stay: Payer: BC Managed Care – PPO | Attending: Oncology

## 2021-12-02 ENCOUNTER — Other Ambulatory Visit: Payer: Self-pay

## 2021-12-02 ENCOUNTER — Inpatient Hospital Stay: Payer: BC Managed Care – PPO

## 2021-12-02 LAB — IRON AND TIBC
Iron: 75 ug/dL (ref 28–170)
Saturation Ratios: 24 % (ref 10.4–31.8)
TIBC: 315 ug/dL (ref 250–450)
UIBC: 240 ug/dL

## 2021-12-02 LAB — HEMOGLOBIN AND HEMATOCRIT, BLOOD
HCT: 43.5 % (ref 36.0–46.0)
Hemoglobin: 14.2 g/dL (ref 12.0–15.0)

## 2021-12-02 LAB — FERRITIN: Ferritin: 760 ng/mL — ABNORMAL HIGH (ref 11–307)

## 2021-12-02 NOTE — Patient Instructions (Signed)

## 2021-12-02 NOTE — Progress Notes (Signed)
500 ml Blood removed as per Therapeutic phlebotomy parameters. Pt tolerated procedure well. VSS. Discharged to home.

## 2021-12-08 ENCOUNTER — Inpatient Hospital Stay: Payer: BC Managed Care – PPO

## 2021-12-08 ENCOUNTER — Other Ambulatory Visit: Payer: Self-pay

## 2021-12-08 NOTE — Patient Instructions (Signed)
Therapeutic Phlebotomy °Therapeutic phlebotomy is the planned removal of blood from a person's body for the purpose of treating a medical condition. The procedure is lot like donating blood. Usually, about a pint (470 mL, or 0.47 L) of blood is removed. The average adult has 9-12 pints (4.3-5.7 L) of blood in his or her body. °Therapeutic phlebotomy may be used to treat the following medical conditions: °Hemochromatosis. This is a condition in which the blood contains too much iron. °Polycythemia vera. This is a condition in which the blood contains too many red blood cells. °Porphyria cutanea tarda. This is a disease in which an important part of hemoglobin is not made properly. It results in the buildup of abnormal amounts of porphyrins in the body. °Sickle cell disease. This is a condition in which the red blood cells form an abnormal crescent shape rather than a round shape. °Tell a health care provider about: °Any allergies you have. °All medicines you are taking, including vitamins, herbs, eye drops, creams, and over-the-counter medicines. °Any bleeding problems you have. °Any surgeries you have had. °Any medical conditions you have. °Whether you are pregnant or may be pregnant. °What are the risks? °Generally, this is a safe procedure. However, problems may occur, including: °Nausea or light-headedness. °Low blood pressure (hypotension). °Soreness, bleeding, swelling, or bruising at the needle insertion site. °Infection. °What happens before the procedure? °Ask your health care provider about: °Changing or stopping your regular medicines. This is especially important if you are taking diabetes medicines or blood thinners. °Taking medicines such as aspirin and ibuprofen. These medicines can thin your blood. Do not take these medicines unless your health care provider tells you to take them. °Taking over-the-counter medicines, vitamins, herbs, and supplements. °Wear clothing with sleeves that can be raised  above the elbow. °You may have a blood sample taken. °Your blood pressure, pulse rate, and breathing rate will be measured. °What happens during the procedure? ° °You may be given a medicine to numb the area (local anesthetic). °A tourniquet will be placed on your arm. °A needle will be put into one of your veins. °Tubing and a collection bag will be attached to the needle. °Blood will flow through the needle and tubing into the collection bag. °The collection bag will be placed lower than your arm so gravity can help the blood flow into the bag. °You may be asked to open and close your hand slowly and continually during the entire collection. °After the specified amount of blood has been removed from your body, the collection bag and tubing will be clamped. °The needle will be removed from your vein. °Pressure will be held on the needle site to stop the bleeding. °A bandage (dressing) will be placed over the needle insertion site. °The procedure may vary among health care providers and hospitals. °What happens after the procedure? °Your blood pressure, pulse rate, and breathing rate will be measured after the procedure. °You will be encouraged to drink fluids. °You will be encouraged to eat a snack to prevent a low blood sugar level. °Your recovery will be assessed and monitored. °Return to your normal activities as told by your health care provider. °Summary °Therapeutic phlebotomy is the planned removal of blood from a person's body for the purpose of treating a medical condition. °Therapeutic phlebotomy may be used to treat hemochromatosis, polycythemia vera, porphyria cutanea tarda, or sickle cell disease. °In the procedure, a needle is inserted and about a pint (470 mL, or 0.47 L) of blood is   removed. The average adult has 9-12 pints (4.3-5.7 L) of blood in the body. °This is generally a safe procedure, but it can sometimes cause problems such as nausea, light-headedness, or low blood pressure  (hypotension). °This information is not intended to replace advice given to you by your health care provider. Make sure you discuss any questions you have with your health care provider. °Document Revised: 06/02/2021 Document Reviewed: 06/02/2021 °Elsevier Patient Education © 2022 [employer] ° °

## 2021-12-08 NOTE — Progress Notes (Signed)
Pt tolerated 500 ml phlebotomy today. VSS. Discharged to home after brief observation.

## 2021-12-10 ENCOUNTER — Ambulatory Visit
Admission: RE | Admit: 2021-12-10 | Discharge: 2021-12-10 | Disposition: A | Discharge status: Home / Self Care | Payer: BC Managed Care – PPO | Source: Ambulatory Visit | Attending: Neurosurgery | Admitting: Neurosurgery

## 2021-12-10 ENCOUNTER — Other Ambulatory Visit: Payer: Self-pay

## 2021-12-10 DIAGNOSIS — M5412 Radiculopathy, cervical region: Secondary | ICD-10-CM | POA: Insufficient documentation

## 2021-12-10 DIAGNOSIS — G8929 Other chronic pain: Secondary | ICD-10-CM | POA: Insufficient documentation

## 2021-12-10 DIAGNOSIS — Z981 Arthrodesis status: Secondary | ICD-10-CM | POA: Insufficient documentation

## 2021-12-10 DIAGNOSIS — M25512 Pain in left shoulder: Secondary | ICD-10-CM | POA: Insufficient documentation

## 2021-12-16 ENCOUNTER — Inpatient Hospital Stay: Payer: BC Managed Care – PPO

## 2021-12-21 ENCOUNTER — Encounter: Payer: Self-pay | Admitting: Oncology

## 2021-12-23 ENCOUNTER — Inpatient Hospital Stay: Payer: BC Managed Care – PPO | Attending: Oncology

## 2021-12-23 ENCOUNTER — Other Ambulatory Visit: Payer: Self-pay

## 2021-12-23 DIAGNOSIS — K7400 Hepatic fibrosis, unspecified: Secondary | ICD-10-CM | POA: Insufficient documentation

## 2021-12-23 DIAGNOSIS — D751 Secondary polycythemia: Secondary | ICD-10-CM | POA: Diagnosis not present

## 2021-12-23 DIAGNOSIS — K7469 Other cirrhosis of liver: Secondary | ICD-10-CM | POA: Diagnosis not present

## 2021-12-23 NOTE — Patient Instructions (Signed)

## 2021-12-23 NOTE — Progress Notes (Signed)
Pt getting phlebotomy every other week per MD orders. Removed 500 ml of blood. Patient tolerated well. Feeling well at time of discharge. Discharged to home. VSS.

## 2021-12-27 ENCOUNTER — Other Ambulatory Visit: Payer: Self-pay | Admitting: Neurosurgery

## 2021-12-27 DIAGNOSIS — Z01818 Encounter for other preprocedural examination: Secondary | ICD-10-CM

## 2021-12-30 ENCOUNTER — Other Ambulatory Visit: Payer: Self-pay | Admitting: *Deleted

## 2021-12-30 ENCOUNTER — Other Ambulatory Visit: Payer: Self-pay

## 2021-12-30 ENCOUNTER — Inpatient Hospital Stay: Payer: BC Managed Care – PPO

## 2021-12-30 ENCOUNTER — Inpatient Hospital Stay: Payer: BC Managed Care – PPO | Attending: Oncology

## 2021-12-30 DIAGNOSIS — Q446 Cystic disease of liver: Secondary | ICD-10-CM | POA: Diagnosis not present

## 2021-12-30 LAB — HEMATOCRIT: HCT: 42.6 % (ref 36.0–46.0)

## 2021-12-30 LAB — HEMOGLOBIN: Hemoglobin: 14.4 g/dL (ref 12.0–15.0)

## 2021-12-30 NOTE — Progress Notes (Signed)
Pt here for phlebotomy; 500 ml of blood removed per parameters. Pt tolerated procedure very well. VSS. Discharge to home after  observation.

## 2022-01-05 ENCOUNTER — Other Ambulatory Visit: Payer: Self-pay | Admitting: *Deleted

## 2022-01-05 DIAGNOSIS — D751 Secondary polycythemia: Secondary | ICD-10-CM

## 2022-01-12 ENCOUNTER — Other Ambulatory Visit: Payer: Self-pay

## 2022-01-12 ENCOUNTER — Encounter
Admission: RE | Admit: 2022-01-12 | Discharge: 2022-01-12 | Disposition: A | Discharge status: Home / Self Care | Payer: BC Managed Care – PPO | Source: Ambulatory Visit | Attending: Neurosurgery | Admitting: Neurosurgery

## 2022-01-12 ENCOUNTER — Inpatient Hospital Stay: Payer: BC Managed Care – PPO

## 2022-01-12 DIAGNOSIS — I10 Essential (primary) hypertension: Secondary | ICD-10-CM | POA: Insufficient documentation

## 2022-01-12 DIAGNOSIS — Z01818 Encounter for other preprocedural examination: Secondary | ICD-10-CM | POA: Insufficient documentation

## 2022-01-12 DIAGNOSIS — Z01810 Encounter for preprocedural cardiovascular examination: Secondary | ICD-10-CM | POA: Diagnosis not present

## 2022-01-12 DIAGNOSIS — D751 Secondary polycythemia: Secondary | ICD-10-CM

## 2022-01-12 HISTORY — DX: Headache, unspecified: R51.9

## 2022-01-12 HISTORY — DX: Secondary polycythemia: D75.1

## 2022-01-12 LAB — URINALYSIS, MICROSCOPIC (REFLEX)

## 2022-01-12 LAB — URINALYSIS, ROUTINE W REFLEX MICROSCOPIC
Bilirubin Urine: NEGATIVE
Glucose, UA: NEGATIVE mg/dL
Hgb urine dipstick: NEGATIVE
Ketones, ur: NEGATIVE mg/dL
Nitrite: NEGATIVE
Protein, ur: NEGATIVE mg/dL
Specific Gravity, Urine: 1.015 (ref 1.005–1.030)
pH: 5 (ref 5.0–8.0)

## 2022-01-12 LAB — BASIC METABOLIC PANEL
Anion gap: 10 (ref 5–15)
BUN: 7 mg/dL (ref 6–20)
CO2: 24 mmol/L (ref 22–32)
Calcium: 9.2 mg/dL (ref 8.9–10.3)
Chloride: 101 mmol/L (ref 98–111)
Creatinine, Ser: 0.58 mg/dL (ref 0.44–1.00)
GFR, Estimated: >60 mL/min (ref >60)
Glucose, Bld: 133 mg/dL — ABNORMAL HIGH (ref 70–99)
Potassium: 4 mmol/L (ref 3.5–5.1)
Sodium: 135 mmol/L (ref 135–145)

## 2022-01-12 LAB — IRON AND TIBC
Iron: 69 ug/dL (ref 28–170)
Saturation Ratios: 21 % (ref 10.4–31.8)
TIBC: 337 ug/dL (ref 250–450)
UIBC: 268 ug/dL

## 2022-01-12 LAB — HEMOGLOBIN AND HEMATOCRIT, BLOOD
HCT: 41.9 % (ref 36.0–46.0)
Hemoglobin: 14.1 g/dL (ref 12.0–15.0)

## 2022-01-12 LAB — CBC
HCT: 41.4 % (ref 36.0–46.0)
Hemoglobin: 13.7 g/dL (ref 12.0–15.0)
MCH: 30.3 pg (ref 26.0–34.0)
MCHC: 33.1 g/dL (ref 30.0–36.0)
MCV: 91.6 fL (ref 80.0–100.0)
Platelets: 228 10*3/uL (ref 150–400)
RBC: 4.52 MIL/uL (ref 3.87–5.11)
RDW: 12.4 % (ref 11.5–15.5)
WBC: 8.4 10*3/uL (ref 4.0–10.5)
nRBC: 0 % (ref 0.0–0.2)

## 2022-01-12 LAB — SURGICAL PCR SCREEN
MRSA, PCR: NEGATIVE
Staphylococcus aureus: NEGATIVE

## 2022-01-12 LAB — TYPE AND SCREEN
ABO/RH(D): O NEG
Antibody Screen: NEGATIVE

## 2022-01-12 LAB — APTT: aPTT: 27 seconds (ref 24–36)

## 2022-01-12 LAB — FERRITIN: Ferritin: 587 ng/mL — ABNORMAL HIGH (ref 11–307)

## 2022-01-12 LAB — PROTIME-INR
INR: 1.1 (ref 0.8–1.2)
Prothrombin Time: 13.9 seconds (ref 11.4–15.2)

## 2022-01-12 NOTE — Patient Instructions (Addendum)
Your procedure is scheduled on:01-26-22 Wednesday Report to the Registration Desk on the 1st floor of the [facility]. Then proceed to the 2nd floor Surgery Desk in the [facility] To find out your arrival time, please call [phone] between 1PM - 3PM on:01-25-22 Tuesday  REMEMBER: Instructions that are not followed completely may result in serious medical risk, up to and including death; or upon the discretion of your surgeon and anesthesiologist your surgery may need to be rescheduled.  Do not eat food after midnight the night before surgery.  No gum chewing, lozengers or hard candies.  You may however, drink CLEAR liquids up to 2 hours before you are scheduled to arrive for your surgery. Do not drink anything within 2 hours of your scheduled arrival time.  Clear liquids include: - water  - apple juice without pulp - gatorade (not RED, PURPLE, OR BLUE) - black coffee or tea (Do NOT add milk or creamers to the coffee or tea) Do NOT drink anything that is not on this list.  TAKE THESE MEDICATIONS THE MORNING OF SURGERY WITH A SIP OF WATER: -amLODipine (NORVASC)  -gabapentin (NEURONTIN) -propranolol (INDERAL)  -meclizine (ANTIVERT)  Stop your aspirin EC 81 MG tablet as instructed by Dr Izora Ribas 7 days prior to surgery -Last dose on 01-18-22 Tuesday-You may resume your Aspirin 14 days after your surgery  One week prior to surgery: Stop Anti-inflammatories (NSAIDS) such as Advil, Aleve, Ibuprofen, Motrin, Naproxen, Naprosyn and Aspirin based products such as Excedrin, Goodys Powder, BC Powder.You may however, take Tylenol if needed for pain up until the day of surgery. Ok to continue your celecoxib (CELEBREX) up until the day prior to surgery  Stop ANY OVER THE COUNTER supplements/vitamins 7 days prior to surgery (Cholecalciferol (VITAMIN D), Magnesium, Multiple Vitamin , Potassium, zinc gluconate )  No Alcohol for 24 hours before or after surgery.  No Smoking including e-cigarettes  for 24 hours prior to surgery.  No chewable tobacco products for at least 6 hours prior to surgery.  No nicotine patches on the day of surgery.  Do not use any "recreational" drugs for at least a week prior to your surgery.  Please be advised that the combination of cocaine and anesthesia may have negative outcomes, up to and including death. If you test positive for cocaine, your surgery will be cancelled.  On the morning of surgery brush your teeth with toothpaste and water, you may rinse your mouth with mouthwash if you wish. Do not swallow any toothpaste or mouthwash.  Use CHG Soap as directed on instruction sheet.  Do not wear jewelry, make-up, hairpins, clips or nail polish.  Do not wear lotions, powders, or perfumes.   Do not shave body from the neck down 48 hours prior to surgery just in case you cut yourself which could leave a site for infection.  Also, freshly shaved skin may become irritated if using the CHG soap.  Contact lenses, hearing aids and dentures may not be worn into surgery.  Do not bring valuables to the hospital. [facility] is not responsible for any missing/lost belongings or valuables.   Notify your doctor if there is any change in your medical condition (cold, fever, infection).  Wear comfortable clothing (specific to your surgery type) to the hospital.  After surgery, you can help prevent lung complications by doing breathing exercises.  Take deep breaths and cough every 1-2 hours. Your doctor may order a device called an Incentive Spirometer to help you take deep breaths.  When coughing or sneezing, hold a pillow firmly against your incision with both hands. This is called splinting. Doing this helps protect your incision. It also decreases belly discomfort.  If you are being admitted to the hospital overnight, leave your suitcase in the car. After surgery it may be brought to your room.  If you are being discharged the day of surgery, you will not  be allowed to drive home. You will need a responsible adult (18 years or older) to drive you home and stay with you that night.   If you are taking public transportation, you will need to have a responsible adult (18 years or older) with you. Please confirm with your physician that it is acceptable to use public transportation.   Please call the [place] Dept. at [phone] if you have any questions about these instructions.  Surgery Visitation Policy:  Patients undergoing a surgery or procedure may have one family member or support person with them as long as that person is not COVID-19 positive or experiencing its symptoms.  That person may remain in the waiting area during the procedure and may rotate out with other people.  Inpatient Visitation:    Visiting hours are 7 a.m. to 8 p.m. Up to two visitors ages 16+ are allowed at one time in a patient room. The visitors may rotate out with other people during the day. Visitors must check out when they leave, or other visitors will not be allowed. One designated support person may remain overnight. The visitor must pass COVID-19 screenings, use hand sanitizer when entering and exiting the patients room and wear a mask at all times, including in the patients room. Patients must also wear a mask when staff or their visitor are in the room. Masking is required regardless of vaccination status.

## 2022-01-13 ENCOUNTER — Inpatient Hospital Stay (HOSPITAL_BASED_OUTPATIENT_CLINIC_OR_DEPARTMENT_OTHER): Payer: BC Managed Care – PPO | Admitting: Oncology

## 2022-01-13 ENCOUNTER — Inpatient Hospital Stay: Payer: BC Managed Care – PPO

## 2022-01-13 ENCOUNTER — Encounter: Payer: Self-pay | Admitting: Oncology

## 2022-01-13 VITALS — BP 172/81 | HR 90 | Temp 98.6°F | Wt 315.7 lb

## 2022-01-13 DIAGNOSIS — D802 Selective deficiency of immunoglobulin A [IgA]: Secondary | ICD-10-CM | POA: Diagnosis not present

## 2022-01-13 DIAGNOSIS — D751 Secondary polycythemia: Secondary | ICD-10-CM | POA: Diagnosis not present

## 2022-01-13 DIAGNOSIS — K7400 Hepatic fibrosis, unspecified: Secondary | ICD-10-CM | POA: Diagnosis not present

## 2022-01-13 DIAGNOSIS — Z148 Genetic carrier of other disease: Secondary | ICD-10-CM | POA: Diagnosis not present

## 2022-01-13 NOTE — Progress Notes (Signed)
Hematology/Oncology progress note Telephone:([phone] [phone]   Patient Care Team: [name], MD as PCP - General (Family Medicine) Earlie Server, MD as Consulting Physician (Hematology and Oncology)  REFERRING PROVIDER: [name], MD  REASON FOR VISIT:  Follow-up for erythrocytosis and iron overload.  HISTORY OF PRESENTING ILLNESS:  Tamara Dodson is a 61 y.o. female who was seen in consultation at the request of [name], MD for evaluation of polycytosis/erythrocytosis Reviewed patient's recent lab work  06/27/2019 Labs showed elevated hemoglobin at 16.9,  total white count 8.5, platelet counts 225,000.  Chronic Onset, duration since 2016.  She does not smoke.  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   # Heterozygous H63D mutation.  She has a history of iron deficiency after hip replacement and she took oral iron supplementation which was discontinued.  09/24/2019 liver biopsy showed moderate parenchymal and sinusoidal cirrhosis, patient heterozygous for H63D gene.  Septal fibrosis with areas suggestive of early bridging fibrosis [at least stage II fibrosis]. Focal and minimal steatosis with no evidence of steatohepatitis.  Total NAFLD score 1 of 8. --Scattered iron granules are identified on the HE sections. The iron stain highlights moderate staining in sinusoidal and periportal parenchymal patterns of distribution  Lost follow up with me after her visit in September 2020 and restablished care 03/17/2021 per recommendation of gastroenterology and the neurosurgeon Dr. Cari Caraway  Elevated Ferritin in the context of liver fibrosis- phlebotomy  02/24/2021 s/p ACDF C3-5   INTERVAL HISTORY Tamara Dodson is a 61 y.o. female who has above history reviewed by me today presents for follow up visit for management of elevated ferritin, heterozygous hemochromatosis mutation.  Liver fibrosis Patient reports feeling well  today. Patient follows up with neurosurgery Dr. Izora Ribas and there is upcoming plan for cervical myelopathy surgeries.  Review of Systems  Constitutional:  Positive for fatigue. Negative for appetite change, chills and fever.  HENT:   Negative for hearing loss and voice change.   Eyes:  Negative for eye problems.  Respiratory:  Negative for chest tightness and cough.   Cardiovascular:  Negative for chest pain.  Gastrointestinal:  Negative for abdominal distention, abdominal pain and blood in stool.  Endocrine: Negative for hot flashes.  Genitourinary:  Negative for difficulty urinating and frequency.   Musculoskeletal:  Positive for arthralgias.       Weakness in gripping   Skin:  Negative for itching and rash.  Neurological:  Negative for extremity weakness.  Hematological:  Negative for adenopathy.  Psychiatric/Behavioral:  Negative for confusion.    MEDICAL HISTORY:  Past Medical History:  Diagnosis Date   Anemia    Arthritis    osteoarthrosis   Cirrhosis ([place])    seeing Dr Tasia Catchings   Erythrocytosis    Fatty liver    Headache    migraines   Hypertension    Iron overload 07/18/2019   Lumbago    Lymph edema    lower extremities   Morbid obesity ([place])     SURGICAL HISTORY: Past Surgical History:  Procedure Laterality Date   ANTERIOR CERVICAL DECOMP/DISCECTOMY FUSION N/A 02/24/2021   Procedure: ANTERIOR CERVICAL DECOMPRESSION/DISCECTOMY FUSION 2 LEVELS C3-5;  Surgeon: Meade Maw, MD;  Location: [facility];  Service: Neurosurgery;  Laterality: N/A;   CHOLECYSTECTOMY     ESOPHAGOGASTRODUODENOSCOPY N/A 01/25/2021   Procedure: ESOPHAGOGASTRODUODENOSCOPY (EGD);  Surgeon: Toledo, [name], MD;  Location: [facility];  Service: Gastroenterology;  Laterality: N/A;  C-19 test on 01/22/2021 Requesting to be the last case due to her transportation   JOINT REPLACEMENT Left    knee, hip   JOINT REPLACEMENT Bilateral    knee   KNEE ARTHROPLASTY Right 09/21/2015   Procedure:  COMPUTER ASSISTED TOTAL KNEE ARTHROPLASTY;  Surgeon: Donato Heinz, MD;  Location: [facility];  Service: Orthopedics;  Laterality: Right;   TONSILLECTOMY     TOTAL HIP ARTHROPLASTY Right 11/17/2017   Procedure: TOTAL HIP ARTHROPLASTY;  Surgeon: Donato Heinz, MD;  Location: [facility];  Service: Orthopedics;  Laterality: Right;    SOCIAL HISTORY: Social History   Socioeconomic History   Marital status: Married    Spouse name: Casimiro Needle    Number of children: 3   Years of education: Not on file   Highest education level: Not on file  Occupational History   Not on file  Tobacco Use   Smoking status: Never   Smokeless tobacco: Never  Vaping Use   Vaping Use: Never used  Substance and Sexual Activity   Alcohol use: No   Drug use: No   Sexual activity: Yes  Other Topics Concern   Not on file  Social History Narrative   Not on file   Social Determinants of Health   Financial Resource Strain: Not on file  Food Insecurity: Not on file  Transportation Needs: Not on file  Physical Activity: Not on file  Stress: Not on file  Social Connections: Not on file  Intimate Partner Violence: Not on file    FAMILY HISTORY: Family History  Problem Relation Age of Onset   Breast cancer Mother        74's    ALLERGIES:  is allergic to gluten meal.  MEDICATIONS:  Current Outpatient Medications  Medication Sig Dispense Refill   acetaminophen (TYLENOL) 500 MG tablet Take 1,000-1,500 mg by mouth every 8 (eight) hours as needed for moderate pain.     amLODipine (NORVASC) 5 MG tablet Take 5 mg by mouth every morning.     aspirin EC 81 MG tablet Take 81 mg daily by mouth.     celecoxib (CELEBREX) 100 MG capsule Take 100 mg by mouth 2 (two) times daily.     Cholecalciferol (VITAMIN D) 50 MCG (2000 UT) tablet Take 2,000 Units by mouth daily.     furosemide (LASIX) 20 MG tablet Take 40 mg by mouth every morning.     gabapentin (NEURONTIN) 300 MG capsule Take 300 mg by mouth 2 (two) times  daily.     Magnesium 250 MG TABS Take 250 mg by mouth daily.     meclizine (ANTIVERT) 12.5 MG tablet Take 12.5 mg by mouth 3 (three) times daily.     methocarbamol (ROBAXIN) 500 MG tablet Take 1-2 tablets (500-1,000 mg total) by mouth every 6 (six) hours as needed for muscle spasms. 60 tablet 0   Multiple Vitamin (MULTIVITAMIN WITH MINERALS) TABS tablet Take 1 tablet by mouth daily.     nortriptyline (PAMELOR) 10 MG capsule Take 40 mg by mouth at bedtime.     Potassium 99 MG TABS Take 99 mg by mouth daily.     propranolol (INDERAL) 40 MG tablet Take 20-40 mg by mouth See admin instructions. Take 20 mg in the morning and 40 mg at night     rizatriptan (MAXALT) 10 MG tablet Take 10 mg by mouth as needed for migraine. May repeat in 2 hours if needed     zinc gluconate 50 MG tablet Take  50 mg by mouth daily.     No current facility-administered medications for this visit.     PHYSICAL EXAMINATION: ECOG PERFORMANCE STATUS: 1 - Symptomatic but completely ambulatory Vitals:   01/13/22 1459  BP: (!) 172/81  Pulse: 90  Temp: 98.6 F (37 C)   Filed Weights   01/13/22 1459  Weight: (!) 315 lb 11.2 oz (143.2 kg)    Physical Exam Constitutional:      General: She is not in acute distress.    Appearance: She is obese.  HENT:     Head: Normocephalic and atraumatic.  Eyes:     General: No scleral icterus.    Pupils: Pupils are equal, round, and reactive to light.  Cardiovascular:     Rate and Rhythm: Normal rate and regular rhythm.     Heart sounds: Normal heart sounds.  Pulmonary:     Effort: Pulmonary effort is normal. No respiratory distress.     Breath sounds: No wheezing.  Abdominal:     General: Bowel sounds are normal. There is no distension.     Palpations: Abdomen is soft.     Tenderness: There is no abdominal tenderness.  Musculoskeletal:        General: No deformity. Normal range of motion.     Cervical back: Normal range of motion and neck supple.  Skin:    General:  Skin is warm and dry.     Findings: No erythema or rash.  Neurological:     Mental Status: She is alert and oriented to person, place, and time.     Cranial Nerves: No cranial nerve deficit.     Coordination: Coordination normal.  Psychiatric:        Behavior: Behavior normal.        Thought Content: Thought content normal.    RADIOGRAPHIC STUDIES: I have personally reviewed the radiological images as listed and agreed with the findings in the report. MR CERVICAL SPINE WO CONTRAST  Result Date: 12/11/2021 CLINICAL DATA:  61 year old female with neck pain and headaches. Status post cervical spine surgery in March this year. EXAM: MRI CERVICAL SPINE WITHOUT CONTRAST TECHNIQUE: Multiplanar, multisequence MR imaging of the cervical spine was performed. No intravenous contrast was administered. COMPARISON:  Postoperative cervical spine radiographs 02/24/2021. Preoperative cervical spine MRI 12/26/2020. FINDINGS: Alignment: Improved cervical lordosis.  No spondylolisthesis. Vertebrae: Hardware susceptibility artifact from C3-C4 and C4-C5 ACDF with interbody spacer devices. No marrow edema or evidence of acute osseous abnormality. Normal background bone marrow signal. Cord: New degenerative spinal cord mass effect at C5-C6 with evidence of associated abnormal cord signal there (series 11, image 9 and series 12, image 20) more suggestive of cord myelomalacia than cord edema. Above and below that level spinal cord signal and morphology within normal limits. See additional details below. Posterior Fossa, vertebral arteries, paraspinal tissues: Cervicomedullary junction is within normal limits. Negative visible posterior fossa. Preserved major vascular flow voids in the neck. Negative visible neck soft tissues and left lung apex. Disc levels: C2-C3: Mild to moderate facet hypertrophy greater on the right. No spinal stenosis. Mild C3 foraminal stenosis appears stable. C3-C4: Interval ACDF. Resolved spinal  stenosis and improved C4 foraminal patency. C4-C5: Interval ACDF. Resolved spinal stenosis and improved C5 foraminal patency. Mild to moderate residual left C5 foraminal stenosis. C5-C6: Some disc space loss since January and substantially increased broad-based posterior disc osteophyte complex (series 12, image 19). Mild ligament flavum hypertrophy. Mild to moderate new spinal stenosis and spinal cord mass effect. Abnormal  cord signal as above. Moderate to severe bilateral C6 foraminal stenosis also appears somewhat increased. C6-C7: Mild disc bulging with a broad-based posterior component appears not significantly changed. Mild facet hypertrophy. Borderline spinal stenosis and left C7 foraminal stenosis appears stable. C7-T1: Small right paracentral disc protrusion (series 12, image 27) appears stable. Mild facet hypertrophy but no stenosis. Subtle T1-T2 disc bulging and mild facet hypertrophy is stable without stenosis. IMPRESSION: 1. Interval ACDF at C3-C4 and C4-C5 with resolved spinal stenosis and improved foraminal patency at both levels. 2. But substantially increased degenerative spinal stenosis at the C5-C6 adjacent segment with mild to moderate spinal cord mass effect AND abnormal cord signal, which more resembles Myelomalacia than cord edema. Moderate to severe bilateral C6 foraminal stenosis also appears somewhat increased. 3. Other cervical levels are stable since January, with borderline spinal and left foraminal stenosis at C6-C7. These results will be called to the ordering clinician or representative by the Radiologist Assistant, and communication documented in the PACS or [employer]. Electronically Signed   By: Genevie Ann M.D.   On: 12/11/2021 06:53     LABORATORY DATA:  I have reviewed the data as listed Lab Results  Component Value Date   WBC 8.4 01/12/2022   HGB 13.7 01/12/2022   HCT 41.4 01/12/2022   MCV 91.6 01/12/2022   PLT 228 01/12/2022   Recent Labs    02/01/21 1348  02/12/21 1018 03/15/21 1348 09/29/21 1429 01/12/22 1152  NA  --  136  --  137 135  K  --  3.7  --  3.6 4.0  CL  --  101  --  100 101  CO2  --  26  --  25 24  GLUCOSE  --  116*  --  128* 133*  BUN  --  16  --  7 7  CREATININE  --  0.65  --  0.67 0.58  CALCIUM  --  9.8  --  8.5* 9.2  GFRNONAA  --  >60  --  >60 >60  PROT 8.1  --  7.8 8.3*  --   ALBUMIN 4.4  --  4.4 4.3  --   AST 31  --  38 42*  --   ALT 31  --  25 25  --   ALKPHOS 75  --  76 93  --   BILITOT 0.8  --  0.6 0.8  --   BILIDIR <0.1  --  0.1  --   --   IBILI NOT CALCULATED  --  0.5  --   --     Iron/TIBC/Ferritin/ %Sat    Component Value Date/Time   IRON 69 01/12/2022 0957   TIBC 337 01/12/2022 0957   FERRITIN 587 (H) 01/12/2022 0957   IRONPCTSAT 21 01/12/2022 0957        ASSESSMENT & PLAN:  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 Erythrocytosis   3. 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 The iron stain highlights moderate staining in sinusoidal and periportal parenchymal patterns of distribution  Initially elevated ferritin 2278, and increased iron saturation 38. Labs reviewed and discussed with patient. Currently ferritin has decreased to 587.  Iron saturation is normal.  I will hold off phlebotomy at this point. 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  I recommend patient to make follow-up appointment with gastroenterology for liver fibrosis. Recommend patient have an MRI abdomen done for quantification of hepatic iron to further guide the need of phlebotomy.  We will touch base with patient's gastroenterology to see if MRI could be ordered to be done at  [facility].  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  Resolved. Decreased IgA level, patient denies any frequent sinopulmonary infection. Discussed about possible increase of anaphylactic reaction to blood products  We spent sufficient time to discuss many aspect of care, questions were answered to patient's satisfaction. The patient knows to call the clinic with any problems questions or concerns.  Leron Croak, MD  Return of visit: 12 weeks.    Earlie Server, MD, PhD 01/13/2022

## 2022-01-24 ENCOUNTER — Encounter: Payer: Self-pay | Admitting: Oncology

## 2022-01-25 NOTE — Anesthesia Preprocedure Evaluation (Addendum)
Anesthesia Evaluation  Patient identified by MRN, date of birth, ID band Patient awake    Reviewed: Allergy & Precautions, H&P , NPO status , Patient's Chart, lab work & pertinent test results  History of Anesthesia Complications Negative for: history of anesthetic complications  Airway Mallampati: III  TM Distance: >3 FB Neck ROM: limited   Comment: Severely limited neck extension Dental  (+) Missing   Pulmonary neg pulmonary ROS, neg sleep apnea, neg COPD,    breath sounds clear to auscultation       Cardiovascular Exercise Tolerance: Good hypertension, Pt. on medications (-) angina(-) Past MI and (-) Cardiac Stents (-) dysrhythmias  Rhythm:regular Rate:Normal  Lymphedema    Neuro/Psych  Headaches, Cervical myelopathy negative psych ROS   GI/Hepatic GERD  Controlled,(+) Cirrhosis       , Liver Fibrosis, s/p biopsy    Endo/Other  Morbid obesity  Renal/GU      Musculoskeletal  (+) Arthritis , Osteoarthritis,    Abdominal (+) + obese,   Peds  Hematology IgA deficiency  Hemochromatosis carrier Erythrocytosis- evaluated by heme/onc. S/P phlebotomy   Anesthesia Other Findings Past Medical History: No date: Anemia No date: Arthritis     Comment:  osteoarthrosis No date: Cirrhosis ([place])     Comment:  seeing Dr Tasia Catchings No date: Fatty liver No date: Hypertension 07/18/2019: Iron overload No date: Lumbago No date: Lymph edema     Comment:  lower extremities No date: Morbid obesity ([place])  Past Surgical History: No date: CHOLECYSTECTOMY 01/25/2021: ESOPHAGOGASTRODUODENOSCOPY; N/A     Comment:  Procedure: ESOPHAGOGASTRODUODENOSCOPY (EGD);  Surgeon:               Toledo, [name], MD;  Location: [facility];                Service: Gastroenterology;  Laterality: N/A;  C-19 test               on 01/22/2021 Requesting to be the last case due to her               transportation No date: JOINT REPLACEMENT; Left      Comment:  knee, hip No date: JOINT REPLACEMENT; Bilateral     Comment:  knee 09/21/2015: KNEE ARTHROPLASTY; Right     Comment:  Procedure: COMPUTER ASSISTED TOTAL KNEE ARTHROPLASTY;                Surgeon: Dereck Leep, MD;  Location: [facility];                Service: Orthopedics;  Laterality: Right; No date: TONSILLECTOMY 11/17/2017: TOTAL HIP ARTHROPLASTY; Right     Comment:  Procedure: TOTAL HIP ARTHROPLASTY;  Surgeon: Dereck Leep, MD;  Location: [facility];  Service: Orthopedics;               Laterality: Right;  BMI    Body Mass Index: 46.58 kg/m      Reproductive/Obstetrics negative OB ROS                            Anesthesia Physical  Anesthesia Plan  ASA: III  Anesthesia Plan: General ETT   Post-op Pain Management: Toradol IV (intra-op) and Celebrex PO (pre-op)   Induction: Intravenous  PONV Risk Score and Plan: Ondansetron, Dexamethasone, Midazolam, TIVA and Propofol infusion  Airway Management Planned: Video Laryngoscope Planned  Additional Equipment:   Intra-op Plan:   Post-operative Plan: Extubation in OR  Informed Consent: I have reviewed the patients History and Physical, chart, labs and discussed the procedure including the risks, benefits and alternatives for the proposed anesthesia with the patient or authorized representative who has indicated his/her understanding and acceptance.     Dental advisory given  Plan Discussed with: Anesthesiologist, CRNA and Surgeon  Anesthesia Plan Comments:        Anesthesia Quick Evaluation

## 2022-01-26 ENCOUNTER — Other Ambulatory Visit: Payer: Self-pay

## 2022-01-26 ENCOUNTER — Ambulatory Visit
Admission: RE | Admit: 2022-01-26 | Discharge: 2022-01-26 | Disposition: A | Discharge status: Home / Self Care | Payer: BC Managed Care – PPO | Source: Ambulatory Visit | Attending: Neurosurgery | Admitting: Neurosurgery

## 2022-01-26 ENCOUNTER — Encounter
Admission: RE | Disposition: A | Discharge status: Home / Self Care | Payer: Self-pay | Source: Ambulatory Visit | Attending: Neurosurgery

## 2022-01-26 ENCOUNTER — Ambulatory Visit: Payer: BC Managed Care – PPO | Admitting: Anesthesiology

## 2022-01-26 ENCOUNTER — Ambulatory Visit: Payer: BC Managed Care – PPO | Admitting: Urgent Care

## 2022-01-26 ENCOUNTER — Encounter: Payer: Self-pay | Admitting: Neurosurgery

## 2022-01-26 ENCOUNTER — Ambulatory Visit: Payer: BC Managed Care – PPO

## 2022-01-26 DIAGNOSIS — G959 Disease of spinal cord, unspecified: Secondary | ICD-10-CM

## 2022-01-26 DIAGNOSIS — K7460 Unspecified cirrhosis of liver: Secondary | ICD-10-CM | POA: Insufficient documentation

## 2022-01-26 DIAGNOSIS — G992 Myelopathy in diseases classified elsewhere: Secondary | ICD-10-CM | POA: Diagnosis not present

## 2022-01-26 DIAGNOSIS — M4802 Spinal stenosis, cervical region: Secondary | ICD-10-CM | POA: Insufficient documentation

## 2022-01-26 DIAGNOSIS — Z981 Arthrodesis status: Secondary | ICD-10-CM | POA: Diagnosis not present

## 2022-01-26 DIAGNOSIS — M1990 Unspecified osteoarthritis, unspecified site: Secondary | ICD-10-CM | POA: Insufficient documentation

## 2022-01-26 DIAGNOSIS — R519 Headache, unspecified: Secondary | ICD-10-CM | POA: Diagnosis not present

## 2022-01-26 DIAGNOSIS — I10 Essential (primary) hypertension: Secondary | ICD-10-CM | POA: Diagnosis not present

## 2022-01-26 DIAGNOSIS — Z6841 Body Mass Index (BMI) 40.0 and over, adult: Secondary | ICD-10-CM | POA: Insufficient documentation

## 2022-01-26 HISTORY — PX: ANTERIOR CERVICAL DECOMP/DISCECTOMY FUSION: SHX1161

## 2022-01-26 SURGERY — ANTERIOR CERVICAL DECOMPRESSION/DISCECTOMY FUSION 1 LEVEL
Anesthesia: General | Site: Back

## 2022-01-26 MED ORDER — ACETAMINOPHEN 10 MG/ML IV SOLN: 1000.0000 mg | Freq: Once | INTRAVENOUS | Status: DC | PRN

## 2022-01-26 MED ORDER — FENTANYL CITRATE (PF) 100 MCG/2ML IJ SOLN
25.0000 ug | INTRAMUSCULAR | Status: DC | PRN
  Administered 2022-01-26 (×2): 25 ug via INTRAVENOUS

## 2022-01-26 MED ORDER — REMIFENTANIL HCL 1 MG IV SOLR
INTRAVENOUS | Status: DC | PRN
  Administered 2022-01-26: .15 ug/kg/min via INTRAVENOUS

## 2022-01-26 MED ORDER — PROPOFOL 10 MG/ML IV BOLUS
INTRAVENOUS | Status: AC
  Filled 2022-01-26: qty 20

## 2022-01-26 MED ORDER — BUPIVACAINE-EPINEPHRINE (PF) 0.5% -1:200000 IJ SOLN
INTRAMUSCULAR | Status: DC | PRN
  Administered 2022-01-26: 5 mL

## 2022-01-26 MED ORDER — FAMOTIDINE 20 MG PO TABS
20.0000 mg | ORAL_TABLET | Freq: Once | ORAL | Status: AC
  Administered 2022-01-26: 20 mg via ORAL

## 2022-01-26 MED ORDER — OXYCODONE HCL 5 MG/5ML PO SOLN: 5.0000 mg | Freq: Once | ORAL | Status: AC | PRN

## 2022-01-26 MED ORDER — FENTANYL CITRATE (PF) 100 MCG/2ML IJ SOLN
INTRAMUSCULAR | Status: DC | PRN
  Administered 2022-01-26 (×2): 50 ug via INTRAVENOUS

## 2022-01-26 MED ORDER — ORAL CARE MOUTH RINSE: 15.0000 mL | Freq: Once | OROMUCOSAL | Status: AC

## 2022-01-26 MED ORDER — PHENYLEPHRINE HCL-NACL 20-0.9 MG/250ML-% IV SOLN
INTRAVENOUS | Status: DC | PRN
  Administered 2022-01-26: 30 ug/min via INTRAVENOUS

## 2022-01-26 MED ORDER — PROMETHAZINE HCL 25 MG/ML IJ SOLN: 6.2500 mg | INTRAMUSCULAR | Status: DC | PRN

## 2022-01-26 MED ORDER — SURGIFLO WITH THROMBIN (HEMOSTATIC MATRIX KIT) OPTIME
TOPICAL | Status: DC | PRN
  Administered 2022-01-26: 1 via TOPICAL

## 2022-01-26 MED ORDER — OXYCODONE HCL 5 MG PO TABS: 5.0000 mg | ORAL_TABLET | Freq: Once | ORAL | Status: AC | PRN

## 2022-01-26 MED ORDER — FENTANYL CITRATE PF 50 MCG/ML IJ SOSY
PREFILLED_SYRINGE | INTRAMUSCULAR | Status: AC
  Filled 2022-01-26: qty 1

## 2022-01-26 MED ORDER — OXYCODONE HCL 5 MG PO TABS
5.0000 mg | ORAL_TABLET | ORAL | 0 refills | Status: DC | PRN
Start: 2022-01-26 — End: 2022-04-11

## 2022-01-26 MED ORDER — KETOROLAC TROMETHAMINE 30 MG/ML IJ SOLN
INTRAMUSCULAR | Status: DC | PRN
  Administered 2022-01-26: 30 mg via INTRAVENOUS

## 2022-01-26 MED ORDER — LACTATED RINGERS IV SOLN: INTRAVENOUS | Status: DC

## 2022-01-26 MED ORDER — PROPOFOL 500 MG/50ML IV EMUL
INTRAVENOUS | Status: DC | PRN
  Administered 2022-01-26: 150 ug/kg/min via INTRAVENOUS

## 2022-01-26 MED ORDER — PROPOFOL 1000 MG/100ML IV EMUL
INTRAVENOUS | Status: AC
  Filled 2022-01-26: qty 100

## 2022-01-26 MED ORDER — CHLORHEXIDINE GLUCONATE 0.12 % MT SOLN
OROMUCOSAL | Status: AC
  Filled 2022-01-26: qty 15

## 2022-01-26 MED ORDER — OXYCODONE HCL 5 MG PO TABS
ORAL_TABLET | ORAL | Status: AC
  Administered 2022-01-26: 5 mg via ORAL
  Filled 2022-01-26: qty 1

## 2022-01-26 MED ORDER — PHENYLEPHRINE HCL (PRESSORS) 10 MG/ML IV SOLN
INTRAVENOUS | Status: DC | PRN
  Administered 2022-01-26 (×3): 180 ug via INTRAVENOUS

## 2022-01-26 MED ORDER — FAMOTIDINE 20 MG PO TABS
ORAL_TABLET | ORAL | Status: AC
  Filled 2022-01-26: qty 1

## 2022-01-26 MED ORDER — ACETAMINOPHEN 10 MG/ML IV SOLN
INTRAVENOUS | Status: DC | PRN
  Administered 2022-01-26: 1000 mg via INTRAVENOUS

## 2022-01-26 MED ORDER — LIDOCAINE HCL (CARDIAC) PF 100 MG/5ML IV SOSY
PREFILLED_SYRINGE | INTRAVENOUS | Status: DC | PRN
  Administered 2022-01-26: 100 mg via INTRAVENOUS

## 2022-01-26 MED ORDER — CEFAZOLIN IN SODIUM CHLORIDE 3-0.9 GM/100ML-% IV SOLN
3.0000 g | INTRAVENOUS | Status: AC
Start: 2022-01-26 — End: 2022-01-26
  Administered 2022-01-26: 3 g via INTRAVENOUS
  Filled 2022-01-26: qty 100

## 2022-01-26 MED ORDER — FENTANYL CITRATE (PF) 100 MCG/2ML IJ SOLN
INTRAMUSCULAR | Status: AC
  Filled 2022-01-26: qty 2

## 2022-01-26 MED ORDER — CHLORHEXIDINE GLUCONATE 0.12 % MT SOLN
15.0000 mL | Freq: Once | OROMUCOSAL | Status: AC
  Administered 2022-01-26: 15 mL via OROMUCOSAL

## 2022-01-26 MED ORDER — EPHEDRINE SULFATE (PRESSORS) 50 MG/ML IJ SOLN
INTRAMUSCULAR | Status: DC | PRN
  Administered 2022-01-26 (×2): 10 mg via INTRAVENOUS

## 2022-01-26 MED ORDER — ONDANSETRON HCL 4 MG/2ML IJ SOLN
INTRAMUSCULAR | Status: DC | PRN
  Administered 2022-01-26 (×2): 4 mg via INTRAVENOUS

## 2022-01-26 MED ORDER — MIDAZOLAM HCL 2 MG/2ML IJ SOLN
INTRAMUSCULAR | Status: DC | PRN
  Administered 2022-01-26: 2 mg via INTRAVENOUS

## 2022-01-26 MED ORDER — ACETAMINOPHEN 10 MG/ML IV SOLN
INTRAVENOUS | Status: AC
  Filled 2022-01-26: qty 100

## 2022-01-26 MED ORDER — 0.9 % SODIUM CHLORIDE (POUR BTL) OPTIME
TOPICAL | Status: DC | PRN
  Administered 2022-01-26: 500 mL

## 2022-01-26 MED ORDER — PHENYLEPHRINE HCL (PRESSORS) 10 MG/ML IV SOLN
INTRAVENOUS | Status: AC
  Filled 2022-01-26: qty 1

## 2022-01-26 MED ORDER — DEXAMETHASONE SODIUM PHOSPHATE 10 MG/ML IJ SOLN
INTRAMUSCULAR | Status: DC | PRN
  Administered 2022-01-26: 10 mg via INTRAVENOUS

## 2022-01-26 MED ORDER — MIDAZOLAM HCL 2 MG/2ML IJ SOLN
INTRAMUSCULAR | Status: AC
  Filled 2022-01-26: qty 2

## 2022-01-26 MED ORDER — GLYCOPYRROLATE 0.2 MG/ML IJ SOLN
INTRAMUSCULAR | Status: DC | PRN
Start: 2022-01-26 — End: 2022-01-26
  Administered 2022-01-26: .2 mg via INTRAVENOUS

## 2022-01-26 MED ORDER — FENTANYL CITRATE (PF) 100 MCG/2ML IJ SOLN
INTRAMUSCULAR | Status: AC
  Administered 2022-01-26: 50 ug via INTRAVENOUS
  Filled 2022-01-26: qty 2

## 2022-01-26 MED ORDER — BUPIVACAINE-EPINEPHRINE (PF) 0.5% -1:200000 IJ SOLN
INTRAMUSCULAR | Status: AC
  Filled 2022-01-26: qty 30

## 2022-01-26 MED ORDER — SUCCINYLCHOLINE CHLORIDE 200 MG/10ML IV SOSY
PREFILLED_SYRINGE | INTRAVENOUS | Status: DC | PRN
Start: 2022-01-26 — End: 2022-01-26
  Administered 2022-01-26: 100 mg via INTRAVENOUS

## 2022-01-26 MED ORDER — PROPOFOL 10 MG/ML IV BOLUS
INTRAVENOUS | Status: DC | PRN
Start: 2022-01-26 — End: 2022-01-26
  Administered 2022-01-26: 200 mg via INTRAVENOUS

## 2022-01-26 MED ORDER — REMIFENTANIL HCL 1 MG IV SOLR
INTRAVENOUS | Status: AC
  Filled 2022-01-26: qty 1000

## 2022-01-26 SURGICAL SUPPLY — 63 items
ADH SKN CLS APL DERMABOND .7 (GAUZE/BANDAGES/DRESSINGS) ×1
AGENT HMST KT MTR STRL THRMB (HEMOSTASIS) ×1
APL PRP STRL LF DISP 70% ISPRP (MISCELLANEOUS) ×2
BULB RESERV EVAC DRAIN JP 100C (MISCELLANEOUS) IMPLANT
BUR NEURO DRILL SOFT 3.0X3.8M (BURR) ×2 IMPLANT
CHLORAPREP W/TINT 26 (MISCELLANEOUS) ×4 IMPLANT
COUNTER NEEDLE 20/40 LG (NEEDLE) ×2 IMPLANT
CUP MEDICINE 2OZ PLAST GRAD ST (MISCELLANEOUS) ×2 IMPLANT
DERMABOND ADVANCED (GAUZE/BANDAGES/DRESSINGS) ×1
DERMABOND ADVANCED .7 DNX12 (GAUZE/BANDAGES/DRESSINGS) ×1 IMPLANT
DRAIN CHANNEL JP 10F RND 20C F (MISCELLANEOUS) IMPLANT
DRAPE C ARM PK CFD 31 SPINE (DRAPES) ×2 IMPLANT
DRAPE LAPAROTOMY 77X122 PED (DRAPES) ×2 IMPLANT
DRAPE MICROSCOPE SPINE 48X150 (DRAPES) ×2 IMPLANT
DRAPE SURG 17X11 SM STRL (DRAPES) ×8 IMPLANT
ELECT CAUTERY BLADE TIP 2.5 (TIP) ×2
ELECT REM PT RETURN 9FT ADLT (ELECTROSURGICAL) ×2
ELECTRODE CAUTERY BLDE TIP 2.5 (TIP) ×1 IMPLANT
ELECTRODE REM PT RTRN 9FT ADLT (ELECTROSURGICAL) ×1 IMPLANT
FEE INTRAOP CADWELL SUPPLY NCS (MISCELLANEOUS) ×1 IMPLANT
FEE INTRAOP MONITOR IMPULS NCS (MISCELLANEOUS) IMPLANT
GAUZE 4X4 16PLY ~~LOC~~+RFID DBL (SPONGE) ×2 IMPLANT
GLOVE SURG SYN 6.5 ES PF (GLOVE) ×2 IMPLANT
GLOVE SURG SYN 6.5 PF PI (GLOVE) ×1 IMPLANT
GLOVE SURG SYN 8.5  E (GLOVE) ×3
GLOVE SURG SYN 8.5 E (GLOVE) ×3 IMPLANT
GLOVE SURG SYN 8.5 PF PI (GLOVE) ×3 IMPLANT
GLOVE SURG UNDER POLY LF SZ6.5 (GLOVE) ×2 IMPLANT
GOWN SRG LRG LVL 4 IMPRV REINF (GOWNS) ×1 IMPLANT
GOWN SRG XL LVL 3 NONREINFORCE (GOWNS) ×1 IMPLANT
GOWN STRL NON-REIN TWL XL LVL3 (GOWNS) ×2
GOWN STRL REIN LRG LVL4 (GOWNS) ×2
GRADUATE 1200CC STRL 31836 (MISCELLANEOUS) ×2 IMPLANT
INTRAOP CADWELL SUPPLY FEE NCS (MISCELLANEOUS) ×1
INTRAOP DISP SUPPLY FEE NCS (MISCELLANEOUS) ×2
INTRAOP MONITOR FEE IMPULS NCS (MISCELLANEOUS) ×1
INTRAOP MONITOR FEE IMPULSE (MISCELLANEOUS) ×1
KIT TURNOVER KIT A (KITS) ×2 IMPLANT
MANIFOLD NEPTUNE II (INSTRUMENTS) ×2 IMPLANT
MARKER SKIN DUAL TIP RULER LAB (MISCELLANEOUS) ×4 IMPLANT
NDL SAFETY ECLIPSE 18X1.5 (NEEDLE) ×1 IMPLANT
NEEDLE HYPO 18GX1.5 SHARP (NEEDLE) ×2
NEEDLE HYPO 22GX1.5 SAFETY (NEEDLE) ×2 IMPLANT
NS IRRIG 500ML POUR BTL (IV SOLUTION) ×1 IMPLANT
PACK LAMINECTOMY NEURO (CUSTOM PROCEDURE TRAY) ×2 IMPLANT
PAD ARMBOARD 7.5X6 YLW CONV (MISCELLANEOUS) ×4 IMPLANT
PIN CASPAR 14 (PIN) ×1 IMPLANT
PIN CASPAR 14MM (PIN) ×2
PLATE ANT CERV XTEND 1 LV 12 (Plate) ×1 IMPLANT
PUTTY DBM PROPEL SM (Putty) ×1 IMPLANT
SCREW VAR 4.2 XD SELF DRILL 16 (Screw) ×4 IMPLANT
SCREW XTEND SELF DRILL 4.6X16 (Screw) ×1 IMPLANT
SPACER C HEDRON 12X14 7M 7D (Spacer) ×1 IMPLANT
SPONGE KITTNER 5P (MISCELLANEOUS) ×2 IMPLANT
STAPLER SKIN PROX 35W (STAPLE) IMPLANT
SURGIFLO W/THROMBIN 8M KIT (HEMOSTASIS) ×2 IMPLANT
SUT V-LOC 90 ABS DVC 3-0 CL (SUTURE) ×2 IMPLANT
SUT VIC AB 3-0 SH 8-18 (SUTURE) ×2 IMPLANT
SYR 30ML LL (SYRINGE) ×2 IMPLANT
TAPE CLOTH 3X10 WHT NS LF (GAUZE/BANDAGES/DRESSINGS) ×3 IMPLANT
TOWEL OR 17X26 4PK STRL BLUE (TOWEL DISPOSABLE) ×6 IMPLANT
TRAY FOLEY MTR SLVR 16FR STAT (SET/KITS/TRAYS/PACK) IMPLANT
TUBING CONNECTING 10 (TUBING) ×2 IMPLANT

## 2022-01-26 NOTE — Discharge Instructions (Addendum)
°Your surgeon has performed an operation on your cervical spine (neck) to relieve pressure on the spinal cord and/or nerves. This involved making an incision in the front of your neck and removing one or more of the discs that support your spine. Next, a small piece of bone, a titanium plate, and screws were used to fuse two or more of the vertebrae (bones) together. ° °The following are instructions to help in your recovery once you have been discharged from the hospital. Even if you feel well, it is important that you follow these activity guidelines. If you do not let your neck heal properly from the surgery, you can increase the chance of return of your symptoms and other complications. ° °* Do not take anti-inflammatory medications for 3 months after surgery (naproxen [Aleve], ibuprofen [Advil, Motrin], etc.). These medications can prevent your bones from healing properly. ° °Activity  °  °No bending, lifting, or twisting (“BLT”). Avoid lifting objects heavier than 10 pounds (gallon milk jug).  Where possible, avoid household activities that involve lifting, bending, reaching, pushing, or pulling such as laundry, vacuuming, grocery shopping, and childcare. Try to arrange for help from friends and family for these activities while your back heals. ° °Increase physical activity slowly as tolerated.  Taking short walks is encouraged, but avoid strenuous exercise. Do not jog, run, bicycle, lift weights, or participate in any other exercises unless specifically allowed by your doctor. ° °Talk to your doctor before resuming sexual activity. ° °You should not drive until cleared by your doctor. ° °Until released by your doctor, you should not return to work or school.  You should rest at home and let your body heal.  ° °You may shower three days after your surgery.  After showering, lightly dab your incision dry. Do not take a tub bath or go swimming until approved by your doctor at your follow-up appointment. ° °If  your doctor ordered a cervical collar (neck brace) for you, you should wear it whenever you are out of bed. You may remove it when lying down or sleeping, but you should wear it at all other times. Not all neck surgeries require a cervical collar. ° °If you smoke, we strongly recommend that you quit.  Smoking has been proven to interfere with normal bone healing and will dramatically reduce the success rate of your surgery. Please contact [facility] (800-QUIT-NOW) and use the resources at www.[facility].com for assistance in stopping smoking. ° °Surgical Incision °  °Keep your incision area clean and dry. ° °Your incision was closed with Dermabond glue. The glue should begin to peel away within about a week °Diet          ° °You may return to your usual diet. However, you may experience discomfort when swallowing in the first month after your surgery. This is normal. You may find that softer foods are more comfortable for you to swallow. Be sure to stay hydrated. ° °When to Contact Us ° °You may experience pain in your neck and/or pain between your shoulder blades. This is normal and should improve in the next few weeks with the help of pain medication, muscle relaxers, and rest. Some patients report that a warm compress on the back of the neck or between the shoulder blades helps. ° °However, should you experience any of the following, contact us immediately: °New numbness or weakness °Pain that is progressively getting worse, and is not relieved by your pain medication, muscle relaxers, rest, and warm compresses °Bleeding,   redness, swelling, pain, or drainage from surgical incision °Chills or flu-like symptoms °Fever greater than 101.0 F (38.3 C) °Inability to eat, drink fluids, or take medications °Problems with bowel or bladder functions °Difficulty breathing or shortness of breath °Warmth, tenderness, or swelling in your calf °Contact Information °During office hours (Monday-Friday 9 am to 5 pm), please call  your physician at [phone] and ask for Kendelyn Jean °After hours and weekends, please call [phone] and speak with the answering service, who will contact the doctor on call.  If that fails, call the [facility] Operator at [phone] and ask for the Neurosurgery Resident On Call  °For a life-threatening emergency, call 911 ° AMBULATORY SURGERY  °DISCHARGE INSTRUCTIONS ° ° °The drugs that you were given will stay in your system until tomorrow so for the next 24 hours you should not: ° °Drive an automobile °Make any legal decisions °Drink any alcoholic beverage ° ° °You may resume regular meals tomorrow.  Today it is better to start with liquids and gradually work up to solid foods. ° °You may eat anything you prefer, but it is better to start with liquids, then soup and crackers, and gradually work up to solid foods. ° ° °Please notify your doctor immediately if you have any unusual bleeding, trouble breathing, redness and pain at the surgery site, drainage, fever, or pain not relieved by medication. ° ° ° °Additional Instructions: ° ° ° ° ° ° ° °Please contact your physician with any problems or Same Day Surgery at [phone], Monday through Friday 6 am to 4 pm, or [facility] number at [phone].  °

## 2022-01-26 NOTE — H&P (Signed)
HISTORY OF PRESENT ILLNESS: 01/26/2022 Tamara Dodson presents today with continued symptoms and arm weakness.  12/15/2021 Tamara Dodson returns today to see me. Over the last 2 months, she has had worsening use of her hands. She is now having significant weakness in gripping items. She has been dropping things. She feels unsteady on her feet. This is all new over the past 6 to 8 weeks. She also developed neck pain in that time.  11/18/2021 Tamara Dodson is status post ACDF C3-5.  She is doing fair. Over the last month to month and a half, she has had some pain particular when she turns her head to the left. She has some discomfort in the back of her left shoulder. It does hurt her to lift her arms above her shoulders. When she rotates her head either way, she can elicit pain into her trapezius muscles.. She is seeing neurology for her headaches and dizziness. She will start back to work next week.  Family History  Problem Relation Age of Onset   Diabetes type II Mother   Breast cancer Mother   High blood pressure (Hypertension) Mother   Other Mother  Renal failure   High blood pressure (Hypertension) Father   Osteoarthritis Father   Social History   Tobacco Use   Smoking status: Never   Smokeless tobacco: Never  Vaping Use   Vaping Use: Never used  Substance Use Topics   Alcohol use: No  Alcohol/week: 0.0 standard drinks   Drug use: No    Allergies  Allergen Reactions   Gluten Meal Diarrhea    bloating     Current Meds  Medication Sig   acetaminophen (TYLENOL) 500 MG tablet Take 1,000-1,500 mg by mouth every 8 (eight) hours as needed for moderate pain.   amLODipine (NORVASC) 5 MG tablet Take 5 mg by mouth every morning.   aspirin EC 81 MG tablet Take 81 mg daily by mouth.   celecoxib (CELEBREX) 100 MG capsule Take 100 mg by mouth 2 (two) times daily.   Cholecalciferol (VITAMIN D) 50 MCG (2000 UT) tablet Take 2,000 Units by mouth daily.   furosemide (LASIX) 20 MG tablet Take 40 mg  by mouth every morning.   gabapentin (NEURONTIN) 300 MG capsule Take 300 mg by mouth 2 (two) times daily.   Magnesium 250 MG TABS Take 250 mg by mouth daily.   meclizine (ANTIVERT) 12.5 MG tablet Take 12.5 mg by mouth 3 (three) times daily.   methocarbamol (ROBAXIN) 500 MG tablet Take 1-2 tablets (500-1,000 mg total) by mouth every 6 (six) hours as needed for muscle spasms.   Multiple Vitamin (MULTIVITAMIN WITH MINERALS) TABS tablet Take 1 tablet by mouth daily.   nortriptyline (PAMELOR) 10 MG capsule Take 40 mg by mouth at bedtime.   Potassium 99 MG TABS Take 99 mg by mouth daily.   propranolol (INDERAL) 40 MG tablet Take 20-40 mg by mouth See admin instructions. Take 20 mg in the morning and 40 mg at night   rizatriptan (MAXALT) 10 MG tablet Take 10 mg by mouth as needed for migraine. May repeat in 2 hours if needed   zinc gluconate 50 MG tablet Take 50 mg by mouth daily.    PHYSICAL EXAMINATION:  Vitals:   01/26/22 0624  BP: 140/77  Pulse: 72  Resp: 18  Temp: 98.7 F (37.1 C)  SpO2: 97%    Heart sounds normal no MRG. Chest Clear to Auscultation Bilaterally.   NEUROLOGICAL:  General: In no acute  distress.  Awake, alert, oriented to person, place, and time. Pupils equal round and reactive to light. Facial tone is symmetric. Tongue protrusion is midline. There is no pronator drift.  Strength: Side Biceps Triceps Deltoid Interossei Grip Wrist Ext. Wrist Flex.  R 5 5 5 4  4- 5 5  L 5 5 5 4  4- 5 5   Reflexes - +Hoffman's on R 1+ throughout  Incision c/d/i  Imaging: Radiographs reviewed. No implant complications noted.  MRI C spine 12/10/21 IMPRESSION:  1. Interval ACDF at C3-C4 and C4-C5 with resolved spinal stenosis  and improved foraminal patency at both levels.   2. But substantially increased degenerative spinal stenosis at the  C5-C6 adjacent segment with mild to moderate spinal cord mass effect  AND abnormal cord signal, which more resembles Myelomalacia than   cord edema.  Moderate to severe bilateral C6 foraminal stenosis also appears  somewhat increased.   3. Other cervical levels are stable since January, with borderline  spinal and left foraminal stenosis at C6-C7.   These results will be called to the ordering clinician or  representative by the Radiologist Assistant, and communication  documented in the PACS or 12/12/21.   Electronically Signed    By: February M.D.    On: 12/11/2021 06:53  Assessment / Plan: Tamara Dodson returns with worsening symptoms of cervical myelopathy. 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 She has severe bilateral C5-6 foraminal stenosis.  Due to her worsening symptoms and objective weakness, I do not think that conservative management is indicated. We will proceed with a C5-6 anterior cervical discectomy and fusion.   Tamara Dodson K. 12/13/2021 MD, MPHS

## 2022-01-26 NOTE — Discharge Summary (Signed)
Physician Discharge Summary  Patient ID: Tamara Dodson MRN: [id number] DOB/AGE: Apr 21, 1961 61 y.o.  Admit date: 01/26/2022 Discharge date: 01/26/2022  Admission Diagnoses: cervical myelopathy  Discharge Diagnoses:  Active Problems:   * No active hospital problems. *   Discharged Condition: good  Hospital Course:  Tamara Dodson is a 61 y.o s/p C5-6 ACDF. Her interoperative course was uncomplicated. She was monitored in PACU for 4 hours post-op and discharged home with Oxycodone after ambulating, urinating, and tolerating PO intake  Consults: None  Significant Diagnostic Studies: none   Treatments: surgery: as above. Please see separately dictated operative report for further details   Discharge Exam: Blood pressure 140/77, pulse 72, temperature 98.7 F (37.1 C), temperature source Oral, resp. rate 18, height 5\' 11"  (1.803 m), weight (!) 140.6 kg, SpO2 97 %. CN II-XII grossly intact 5/5 strength throughout BUE except 4/5 bilateral IO and HG Incision c/d/i  Disposition: Discharge disposition: 01-Home or Self Care        Allergies as of 01/26/2022       Reactions   Gluten Meal Diarrhea   bloating        Medication List     TAKE these medications    acetaminophen 500 MG tablet Commonly known as: TYLENOL Take 1,000-1,500 mg by mouth every 8 (eight) hours as needed for moderate pain.   amLODipine 5 MG tablet Commonly known as: NORVASC Take 5 mg by mouth every morning.   aspirin EC 81 MG tablet Take 81 mg daily by mouth.   celecoxib 100 MG capsule Commonly known as: CELEBREX Take 100 mg by mouth 2 (two) times daily.   furosemide 20 MG tablet Commonly known as: LASIX Take 40 mg by mouth every morning.   gabapentin 300 MG capsule Commonly known as: NEURONTIN Take 300 mg by mouth 2 (two) times daily.   Magnesium 250 MG Tabs Take 250 mg by mouth daily.   meclizine 12.5 MG tablet Commonly known as: ANTIVERT Take 12.5 mg by mouth 3 (three) times daily.    methocarbamol 500 MG tablet Commonly known as: Robaxin Take 1-2 tablets (500-1,000 mg total) by mouth every 6 (six) hours as needed for muscle spasms.   multivitamin with minerals Tabs tablet Take 1 tablet by mouth daily.   nortriptyline 10 MG capsule Commonly known as: PAMELOR Take 40 mg by mouth at bedtime.   oxyCODONE 5 MG immediate release tablet Commonly known as: Roxicodone Take 1 tablet (5 mg total) by mouth every 4 (four) hours as needed for severe pain.   Potassium 99 MG Tabs Take 99 mg by mouth daily.   propranolol 40 MG tablet Commonly known as: INDERAL Take 20-40 mg by mouth See admin instructions. Take 20 mg in the morning and 40 mg at night   rizatriptan 10 MG tablet Commonly known as: MAXALT Take 10 mg by mouth as needed for migraine. May repeat in 2 hours if needed   Vitamin D 50 MCG (2000 UT) tablet Take 2,000 Units by mouth daily.   zinc gluconate 50 MG tablet Take 50 mg by mouth daily.        Follow-up Information     03/26/2022, PA Follow up in 2 week(s).   Why: for post-op follow up. This appointment should already be scheduled with the [facility]. Contact information: [address] [place] [phone]                 Signed: [phone] 01/26/2022, 9:07  AM

## 2022-01-26 NOTE — Anesthesia Procedure Notes (Signed)
Procedure Name: Intubation Date/Time: 01/26/2022 7:29 AM Performed by: Kelton Pillar, CRNA Pre-anesthesia Checklist: Patient identified, Emergency Drugs available, Suction available and Patient being monitored Patient Re-evaluated:Patient Re-evaluated prior to induction Oxygen Delivery Method: Circle system utilized Preoxygenation: Pre-oxygenation with 100% oxygen Induction Type: IV induction Ventilation: Mask ventilation without difficulty Laryngoscope Size: McGraph and 3 Grade View: Grade I Tube type: Oral Tube size: 7.0 mm Number of attempts: 1 Airway Equipment and Method: Stylet and Oral airway Placement Confirmation: ETT inserted through vocal cords under direct vision, positive ETCO2, breath sounds checked- equal and bilateral and CO2 detector Secured at: 21 cm Tube secured with: Tape Dental Injury: Teeth and Oropharynx as per pre-operative assessment

## 2022-01-26 NOTE — Transfer of Care (Signed)
Immediate Anesthesia Transfer of Care Note  Patient: Tamara Dodson  Procedure(s) Performed: C5-6 ANTERIOR CERVICAL DECOMPRESSION & FUSION (GLOBUS HEDRON) (Back)  Patient Location: PACU  Anesthesia Type:General  Level of Consciousness: awake, drowsy and patient cooperative  Airway & Oxygen Therapy: Patient Spontanous Breathing and Patient connected to face mask oxygen  Post-op Assessment: Report given to RN and Post -op Vital signs reviewed and stable  Post vital signs: Reviewed and stable  Last Vitals:  Vitals Value Taken Time  BP 124/66 01/26/22 0909  Temp    Pulse 80 01/26/22 0913  Resp 18 01/26/22 0912  SpO2 99 % 01/26/22 0913  Vitals shown include unvalidated device data.  Last Pain:  Vitals:   01/26/22 0624  TempSrc: Oral         Complications: No notable events documented.

## 2022-01-26 NOTE — Anesthesia Postprocedure Evaluation (Signed)
Anesthesia Post Note  Patient: Tamara Dodson  Procedure(s) Performed: C5-6 ANTERIOR CERVICAL DISCECTOMY & FUSION (GLOBUS HEDRON) (Back)  Patient location during evaluation: PACU Anesthesia Type: General Level of consciousness: awake and alert Pain management: pain level controlled Vital Signs Assessment: post-procedure vital signs reviewed and stable Respiratory status: spontaneous breathing, nonlabored ventilation and respiratory function stable Cardiovascular status: blood pressure returned to baseline and stable Postop Assessment: no apparent nausea or vomiting Anesthetic complications: no   No notable events documented.   Last Vitals:  Vitals:   01/26/22 1147 01/26/22 1238  BP: 124/69 (!) 148/67  Pulse: 84 90  Resp: 16 16  Temp: 36.6 C   SpO2: 96% 97%    Last Pain:  Vitals:   01/26/22 1238  TempSrc:   PainSc: 9                  Fredia Chittenden Romie Minus

## 2022-01-26 NOTE — Op Note (Signed)
Indications: Tamara Dodson is suffering from G95.9 cervical myelopathy. she failed conservative management, and elected to proceed with surgery.  Findings: cervical stenosis  Preoperative Diagnosis: G95.9 cervical myelopathy Postoperative Diagnosis: same   EBL: 20 ml IVF: 500 ml Drains: none Disposition: Extubated and Stable to PACU Complications: none  No foley catheter was placed.   Preoperative Note:   Risks of surgery discussed include: infection, bleeding, stroke, coma, death, paralysis, CSF leak, nerve/spinal cord injury, numbness, tingling, weakness, complex regional pain syndrome, recurrent stenosis and/or disc herniation, vascular injury, development of instability, neck/back pain, need for further surgery, persistent symptoms, development of deformity, and the risks of anesthesia. The patient understood these risks and agreed to proceed.  Operative Note:   Procedure:  1) Anterior cervical diskectomy and fusion at C5-6 2) Anterior cervical instrumentation at C5-6 using Globus Xtend 3) Insertion of biomechanical device at C5-6   Procedure: After obtaining informed consent, the patient taken to the operating room, placed in supine position, general anesthesia induced.  The patient had a small shoulder roll placed behind their shoulders.  The patient received preop antibiotics and IV Decadron.  The patient had a neck incision outlined, was prepped and draped in usual sterile fashion. The incision was injected with local anesthetic.   An incision was opened, dissection taken down medial to the carotid artery and jugular vein, lateral to the trachea and esophagus.  The prevertebral fascia was identified, and a localizing x-ray demonstrated the correct level.  The longus colli were dissected laterally, and self-retaining retractors placed to open the operative field. The microscope was then brought into the field.  With this complete, distractor pins were placed in the vertebral  bodies of C5 and C6. The distractor was placed, and the annulus at C5/6 was opened using a bovie.  Curettes and pituitary rongeurs used to remove the majority of disk, then the drill was used to remove the posterior osteophyte and begin the foraminotomies. The nerve hook was used to elevate the posterior longitudinal ligament, which was then removed with Kerrison rongeurs. Bilateral foraminotomies were performed. The microblunt nerve hook could be passed out the foramen bilaterally.   Meticulous hemostasis was obtained.  A biomechanical device (Globus Hedron 7 mm height x 14 mm width by 12 mm depth) was placed at C5/6. The device had been filled with allograft for aid in arthrodesis.  The caspar distractor was removed, and bone wax used for hemostasis. A 12 mm Globus Xtend plate was chosen.  Two screws placed in each vertebral body, respectively making sure the screws were behind the locking mechanism.  Final AP and lateral radiographs were taken.   With everything in good position, the wound was irrigated copiously with bacitracin-containing solution and meticulous hemostasis obtained.  Wound was closed in 2 layers using interrupted inverted 3-0 Vicryl sutures.  The wound was dressed with dermabond, the head of bed at 30 degrees, taken to recovery room in stable condition.  No new postop neurological deficits were identified.  Sponge and pattie counts were correct at the end of the procedure. Monitoring was stable throughout.  I performed the entire procedure with the assistance of Manning Charity PA as an Designer, television/film set.  Venetia Night MD

## 2022-01-26 NOTE — Progress Notes (Signed)
PHARMACY -  BRIEF ANTIBIOTIC NOTE   Pharmacy has received consult(s) for Cefazolin from an OR provider.  The patient's profile has been reviewed for ht/wt/allergies/indication/available labs.    One time order(s) placed for Cefazolin 3 gm pre-op per pt wt: 143.2 kg  Further antibiotics/pharmacy consults should be ordered by admitting physician if indicated.                       Thank you, Otelia Sergeant, PharmD, [facility] 01/26/2022 6:18 AM

## 2022-01-27 ENCOUNTER — Encounter: Payer: Self-pay | Admitting: Neurosurgery

## 2022-03-18 ENCOUNTER — Other Ambulatory Visit (HOSPITAL_COMMUNITY): Payer: Self-pay | Admitting: Sports Medicine

## 2022-03-18 ENCOUNTER — Other Ambulatory Visit: Payer: Self-pay | Admitting: Sports Medicine

## 2022-03-18 DIAGNOSIS — M7542 Impingement syndrome of left shoulder: Secondary | ICD-10-CM

## 2022-03-18 DIAGNOSIS — M7552 Bursitis of left shoulder: Secondary | ICD-10-CM

## 2022-03-18 DIAGNOSIS — M778 Other enthesopathies, not elsewhere classified: Secondary | ICD-10-CM

## 2022-03-18 DIAGNOSIS — G8929 Other chronic pain: Secondary | ICD-10-CM

## 2022-03-26 IMAGING — RF DG CERVICAL SPINE 2 OR 3 VIEWS
1 series · 3 of 3 positions shown · non-contrast
Comparison: MRI December 2020.

CLINICAL DATA: Surgery.  ACDF at C3-C4 and C4-C5.

EXAM:
CERVICAL SPINE - 2-3 VIEW; DG C-ARM 1-60 MIN

[Series 1: dg x-ray · 0.20mm/px · 3 of 3 slices shown]
[im 1/3]
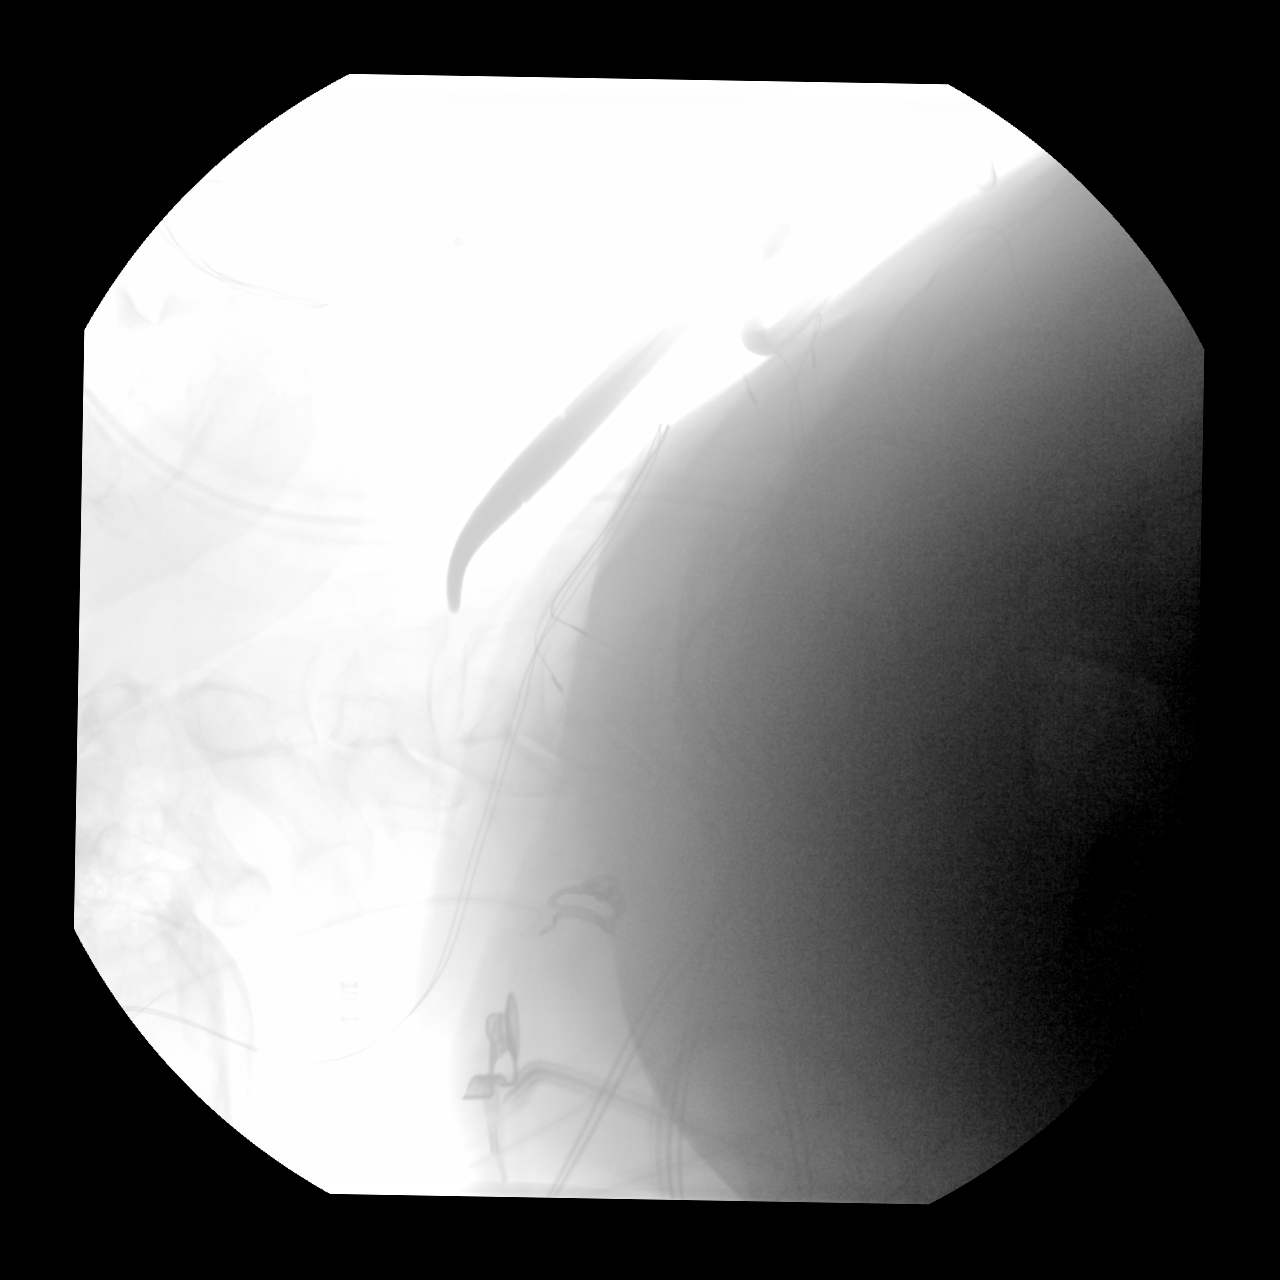
[im 2/3]
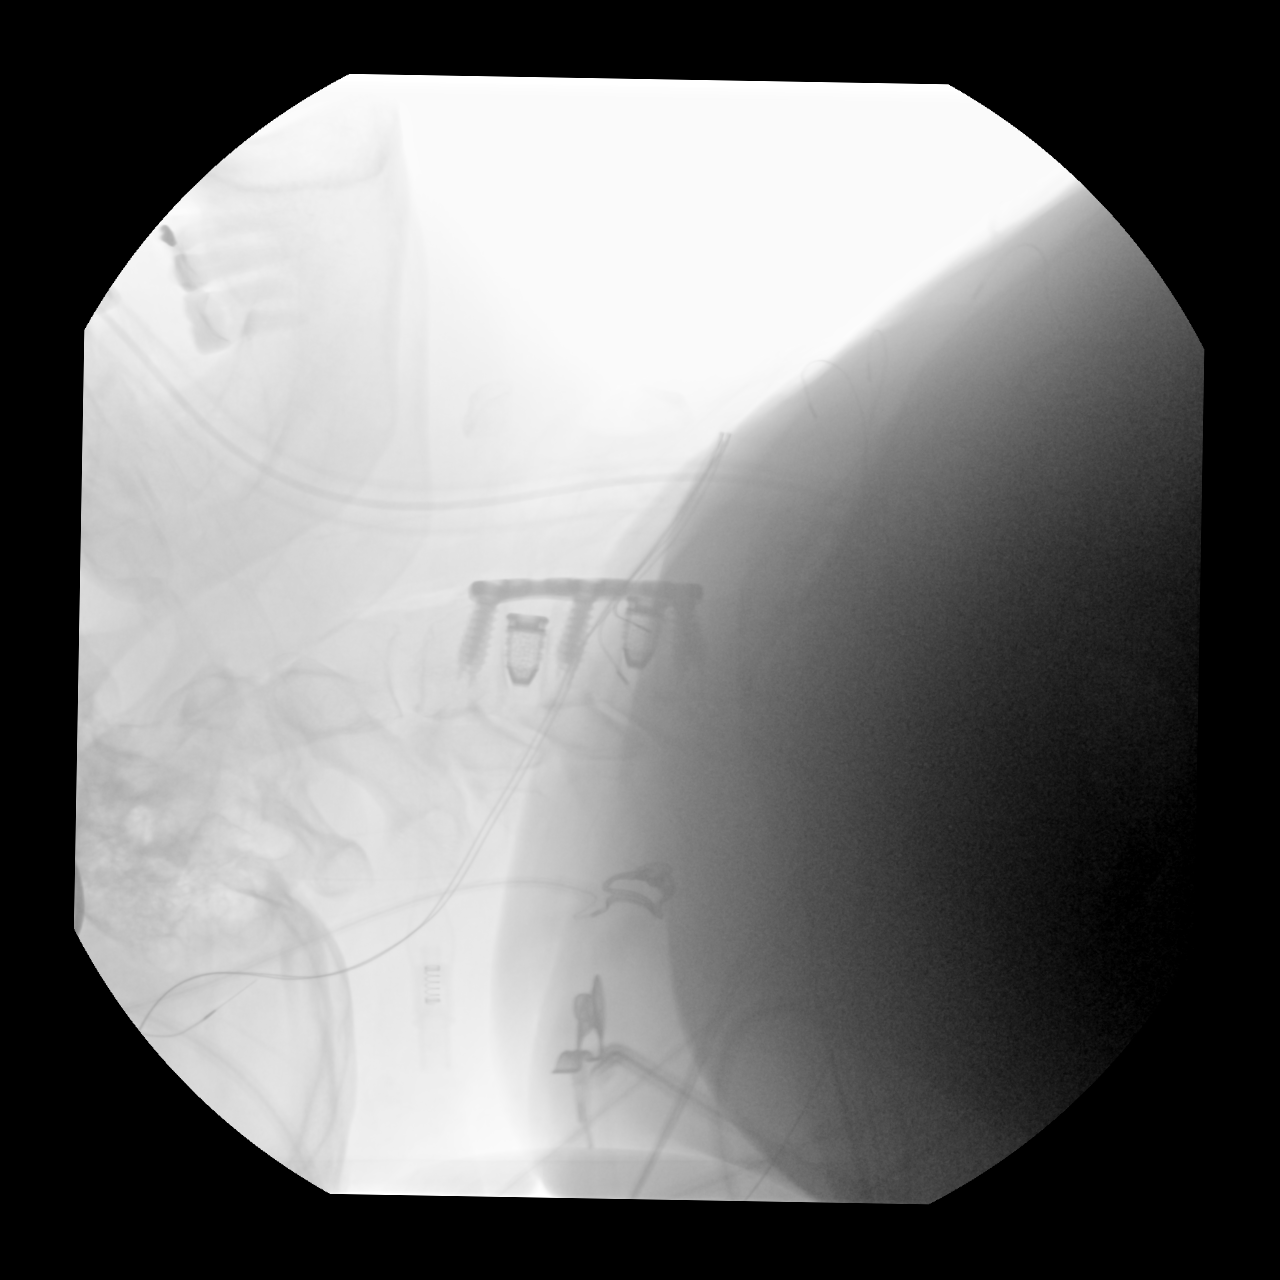
[im 3/3]
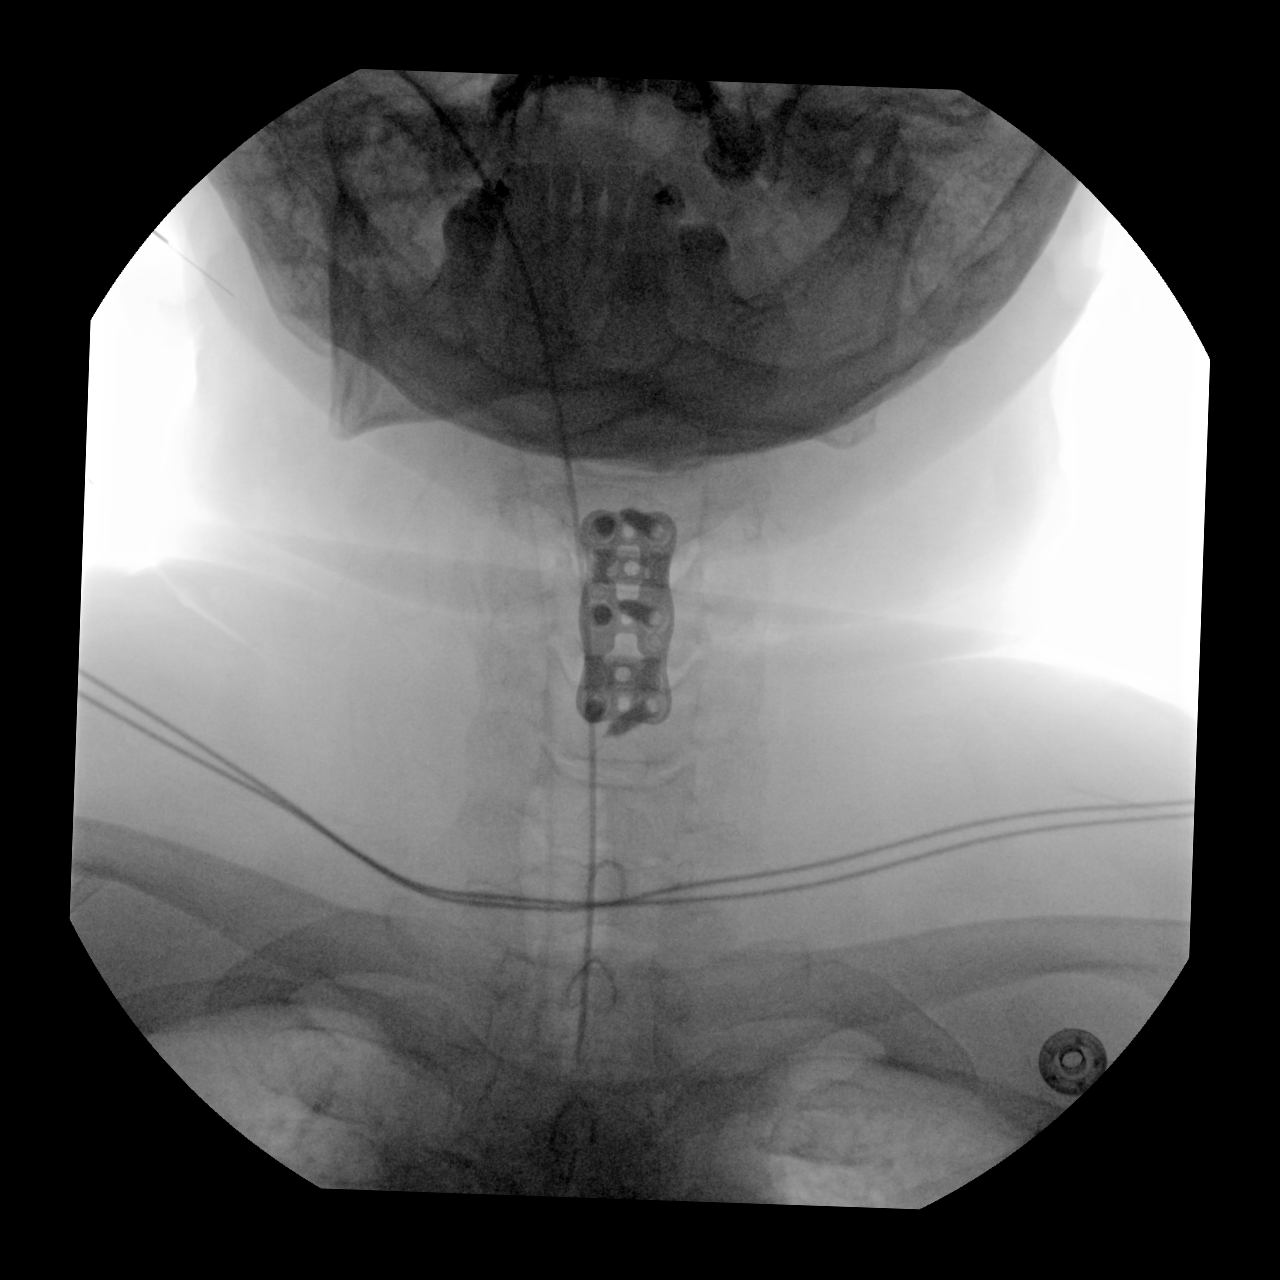

[3 of 3 positions shown; findings below may reference images not displayed]

FINDINGS: Fluoro time: 2.2 seconds.

Three C-arm fluoroscopic images were obtained intraoperatively and
submitted for post operative interpretation. The first image
demonstrates surgical probe projecting at the anterior C3-C4 disc
space. Subsequent 2 images demonstrate postsurgical changes of C3
through C5 ACDF with plate and screws and intervening spacers at
C3-C4 and C4-C5. No unexpected findings. Please see the performing
provider's procedural report for further detail.
IMPRESSION: C3-C5 ACDF.

## 2022-03-26 IMAGING — CR DG CERVICAL SPINE 2 OR 3 VIEWS
5 series · 5 of 5 positions shown · non-contrast
Comparison: None.

CLINICAL DATA: Post cervical fusion

EXAM:
CERVICAL SPINE - 2-3 VIEW

[c-spine lat]
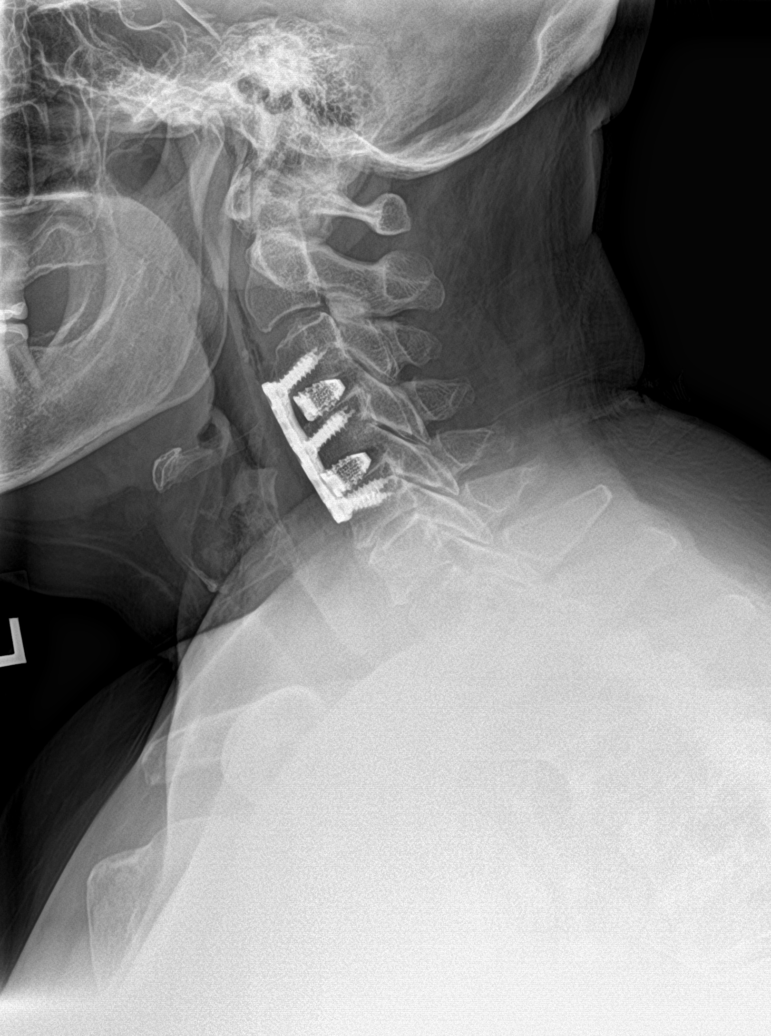

[c-spine ap]
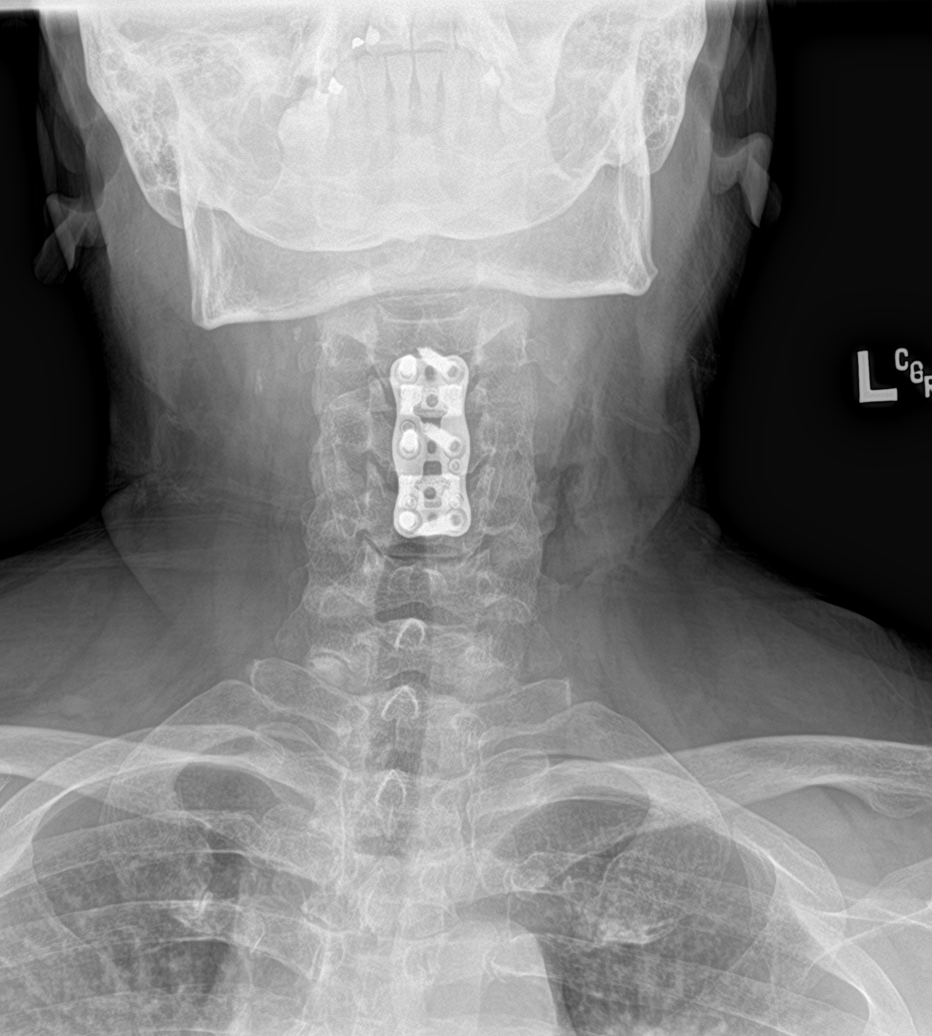

[c-spine open mouth]
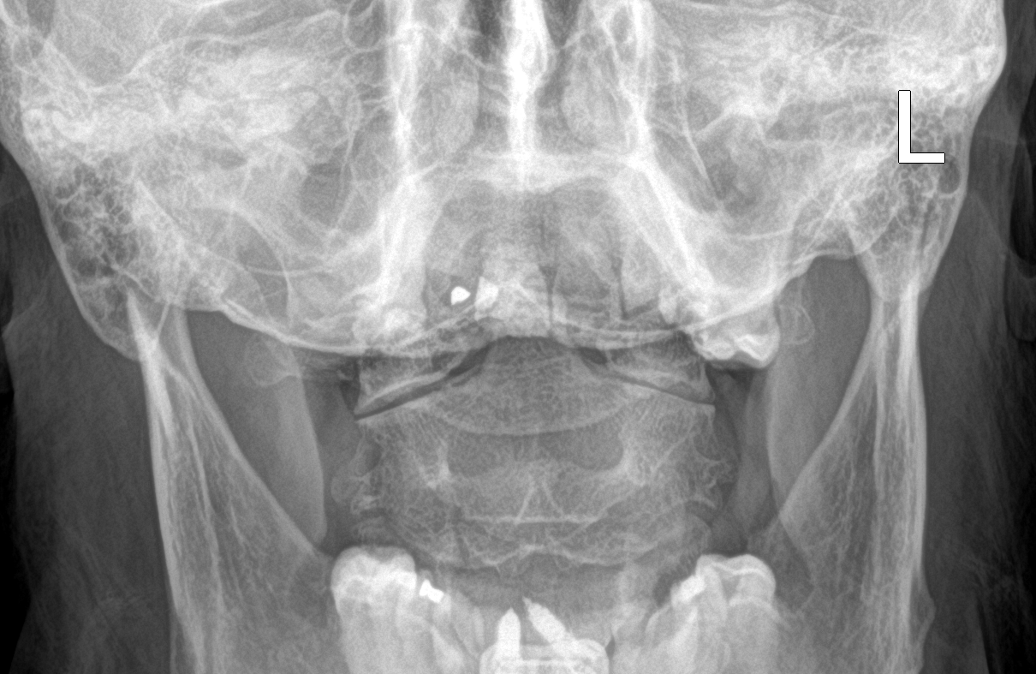

[c-spine swimmers]
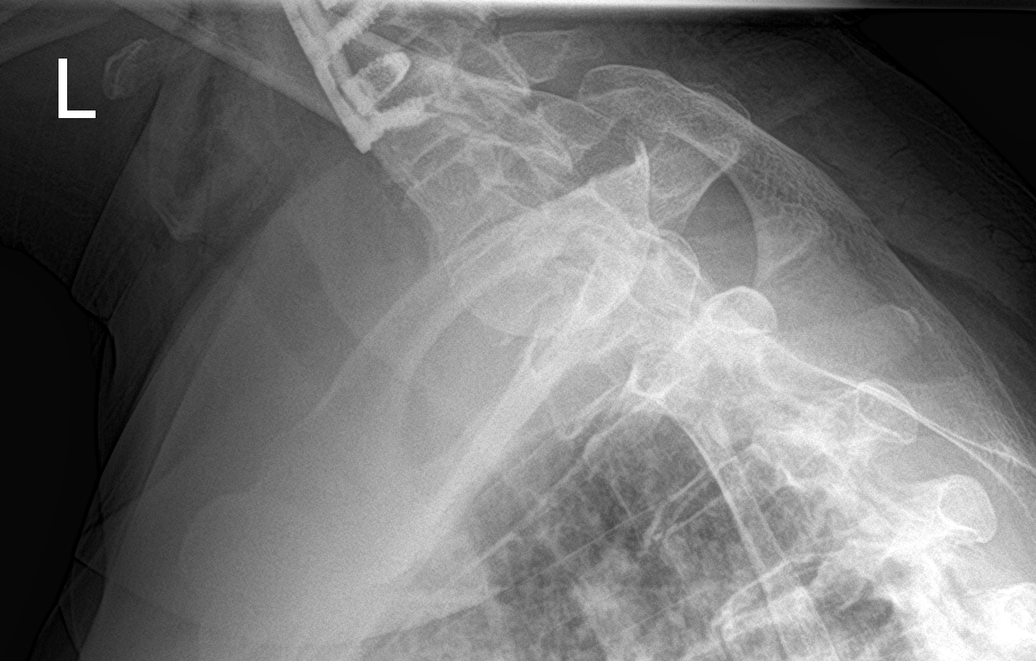

[[person_name]]
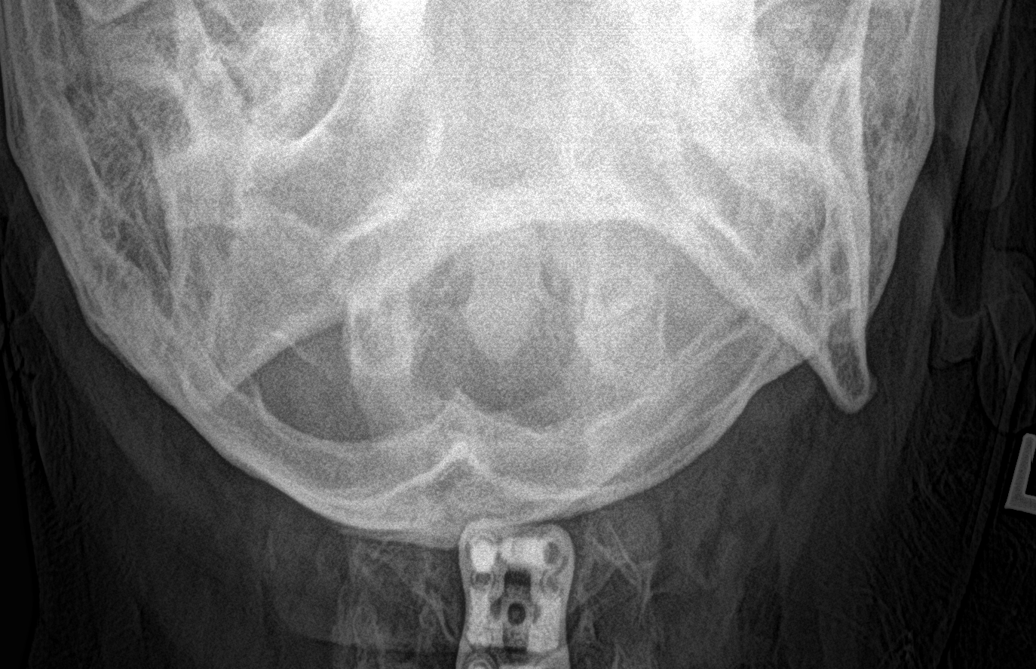

[5 of 5 positions shown; findings below may reference images not displayed]

FINDINGS: Postoperative changes of recent anterior fusion at C3-C5 with plate
and screw fixation and interbody allografts. There is prevertebral
soft tissue swelling and air. Multilevel disc space narrowing above
and below operative levels. Uncovertebral and facet hypertrophy.
IMPRESSION: Expected postoperative changes of recent ACDF at C3-C5.

## 2022-04-01 ENCOUNTER — Ambulatory Visit
Admission: RE | Admit: 2022-04-01 | Discharge: 2022-04-01 | Disposition: A | Discharge status: Home / Self Care | Payer: BC Managed Care – PPO | Source: Ambulatory Visit | Attending: Sports Medicine | Admitting: Sports Medicine

## 2022-04-01 DIAGNOSIS — M25512 Pain in left shoulder: Secondary | ICD-10-CM | POA: Diagnosis not present

## 2022-04-01 DIAGNOSIS — G8929 Other chronic pain: Secondary | ICD-10-CM | POA: Insufficient documentation

## 2022-04-01 DIAGNOSIS — M7552 Bursitis of left shoulder: Secondary | ICD-10-CM

## 2022-04-01 DIAGNOSIS — M778 Other enthesopathies, not elsewhere classified: Secondary | ICD-10-CM

## 2022-04-01 DIAGNOSIS — M7542 Impingement syndrome of left shoulder: Secondary | ICD-10-CM | POA: Diagnosis present

## 2022-04-05 ENCOUNTER — Other Ambulatory Visit: Payer: Self-pay | Admitting: Orthopedic Surgery

## 2022-04-07 ENCOUNTER — Other Ambulatory Visit: Payer: Self-pay

## 2022-04-07 ENCOUNTER — Other Ambulatory Visit
Admission: RE | Admit: 2022-04-07 | Discharge: 2022-04-07 | Disposition: A | Discharge status: Home / Self Care | Payer: BC Managed Care – PPO | Source: Ambulatory Visit | Attending: Orthopedic Surgery | Admitting: Orthopedic Surgery

## 2022-04-07 HISTORY — DX: Myoneural disorder, unspecified: G70.9

## 2022-04-07 HISTORY — DX: Dizziness and giddiness: R42

## 2022-04-07 NOTE — Patient Instructions (Addendum)
Your procedure is scheduled on: 04/11/22 - Monday ?Report to the Registration Desk on the 1st floor of the [facility]. ?To find out your arrival time, please call [phone] between 1PM - 3PM on: 04/08/22 - Friday ? ?REMEMBER: ?Instructions that are not followed completely may result in serious medical risk, up to and including death; or upon the discretion of your surgeon and anesthesiologist your surgery may need to be rescheduled. ? ?Do not eat food after midnight the night before surgery.  ?No gum chewing, lozengers or hard candies. ? ?You may however, drink CLEAR liquids up to 2 hours before you are scheduled to arrive for your surgery. Do not drink anything within 2 hours of your scheduled arrival time. ? ?Clear liquids include: ?- water  ?- apple juice without pulp ?- gatorade (not RED colors) ?- black coffee or tea (Do NOT add milk or creamers to the coffee or tea) ?Do NOT drink anything that is not on this list. ? ?In addition, your doctor has ordered for you to drink the provided  ?Ensure Pre-Surgery Clear Carbohydrate Drink  ?Drinking this carbohydrate drink up to two hours before surgery helps to reduce insulin resistance and improve patient outcomes. Please complete drinking 2 hours prior to scheduled arrival time. ? ?TAKE ONLY THESE MEDICATIONS THE MORNING OF SURGERY WITH A SIP OF WATER: ? ?- amLODipine (NORVASC ?- celecoxib (CELEBREX) 100 MG capsule ?- gabapentin (NEURONTIN) 300 MG capsule ?- meclizine (ANTIVERT) 12.5 MG tablet ?- propranolol (INDERAL) 40 MG tablet ? ? ?Follow recommendations from Cardiologist, Pulmonologist or PCP regarding stopping Aspirin, Coumadin, Plavix, Eliquis, Pradaxa, or Pletal. ? ?One week prior to surgery: ?Stop Anti-inflammatories (NSAIDS) such as Advil, Aleve, Ibuprofen, Motrin, Naproxen, Naprosyn and Aspirin based products such as Excedrin, Goodys Powder, BC Powder. ? ?Stop ANY OVER THE COUNTER supplements until after surgery. ? ?You may however, continue to take  Tylenol if needed for pain up until the day of surgery. ? ?No Alcohol for 24 hours before or after surgery. ? ?No Smoking including e-cigarettes for 24 hours prior to surgery.  ?No chewable tobacco products for at least 6 hours prior to surgery.  ?No nicotine patches on the day of surgery. ? ?Do not use any "recreational" drugs for at least a week prior to your surgery.  ?Please be advised that the combination of cocaine and anesthesia may have negative outcomes, up to and including death. ?If you test positive for cocaine, your surgery will be cancelled. ? ?On the morning of surgery brush your teeth with toothpaste and water, you may rinse your mouth with mouthwash if you wish. ?Do not swallow any toothpaste or mouthwash. ? ?Use CHG Soap or wipes as directed on instruction sheet. ? ?Do not wear jewelry, make-up, hairpins, clips or nail polish. ? ?Do not wear lotions, powders, or perfumes.  ? ?Do not shave body from the neck down 48 hours prior to surgery just in case you cut yourself which could leave a site for infection.  ?Also, freshly shaved skin may become irritated if using the CHG soap. ? ?Contact lenses, hearing aids and dentures may not be worn into surgery. ? ?Do not bring valuables to the hospital. [facility] is not responsible for any missing/lost belongings or valuables.  ? ?Notify your doctor if there is any change in your medical condition (cold, fever, infection). ? ?Wear comfortable clothing (specific to your surgery type) to the hospital. ? ?After surgery, you can help prevent lung complications by doing breathing exercises.  ?  Take deep breaths and cough every 1-2 hours. Your doctor may order a device called an Incentive Spirometer to help you take deep breaths. ?When coughing or sneezing, hold a pillow firmly against your incision with both hands. This is called ?splinting.? Doing this helps protect your incision. It also decreases belly discomfort. ? ?If you are being admitted to the hospital  overnight, leave your suitcase in the car. ?After surgery it may be brought to your room. ? ?If you are being discharged the day of surgery, you will not be allowed to drive home. ?You will need a responsible adult (18 years or older) to drive you home and stay with you that night.  ? ?If you are taking public transportation, you will need to have a responsible adult (18 years or older) with you. ?Please confirm with your physician that it is acceptable to use public transportation.  ? ?Please call the [facility] at [phone] if you have any questions about these instructions. ? ?Surgery Visitation Policy: ? ?Patients undergoing a surgery or procedure may have two family members or support persons with them as long as the person is not COVID-19 positive or experiencing its symptoms.  ? ?Inpatient Visitation:   ? ?Visiting hours are 7 a.m. to 8 p.m. ?Up to four visitors are allowed at one time in a patient room, including children. The visitors may rotate out with other people during the day. One designated support person (adult) may remain overnight.  ?

## 2022-04-08 ENCOUNTER — Telehealth: Payer: Self-pay | Admitting: Oncology

## 2022-04-08 NOTE — Anesthesia Preprocedure Evaluation (Addendum)
Anesthesia Evaluation  ?Patient identified by MRN, date of birth, ID band ?Patient awake ? ? ? ?Reviewed: ?Allergy & Precautions, H&P , NPO status , Patient's Chart, lab work & pertinent test results ? ?History of Anesthesia Complications ?Negative for: history of anesthetic complications ? ?Airway ?Mallampati: III ? ?TM Distance: >3 FB ?Neck ROM: limited ? ? ?Comment: Severely limited neck extension Dental ? ?(+) Missing ?  ?Pulmonary ?neg pulmonary ROS, neg sleep apnea, neg COPD,  ?  ?breath sounds clear to auscultation ? ? ? ? ? ? Cardiovascular ?Exercise Tolerance: Good ?hypertension, Pt. on medications ?(-) angina(-) Past MI and (-) Cardiac Stents (-) dysrhythmias  ?Rhythm:regular Rate:Normal ? ?Lymphedema  ?  ?Neuro/Psych ? Headaches, Cervical myelopathy s/p fusion ?negative psych ROS  ? GI/Hepatic ?GERD  Controlled,(+) Cirrhosis  ?  ?  ? , Liver Fibrosis, s/p biopsy ? ?  ?Endo/Other  ?Morbid obesity ? Renal/GU ?  ? ?  ?Musculoskeletal ? ?(+) Arthritis , Osteoarthritis,  Incomplete tear of left rotator cuff  ? Abdominal ?(+) + obese,   ?Peds ? Hematology ?IgA deficiency ? ?Hemochromatosis carrier ?Erythrocytosis- evaluated by heme/onc. Pt undergoes intermittent phlebotomy   ?Anesthesia Other Findings ?Past Medical History: ?No date: Anemia ?No date: Arthritis ?    Comment:  osteoarthrosis ?No date: Cirrhosis ([place]) ?    Comment:  seeing Dr Tasia Catchings ?No date: Fatty liver ?No date: Hypertension ?07/18/2019: Iron overload ?No date: Lumbago ?No date: Lymph edema ?    Comment:  lower extremities ?No date: Morbid obesity ([place]) ? ?Past Surgical History: ?No date: CHOLECYSTECTOMY ?01/25/2021: ESOPHAGOGASTRODUODENOSCOPY; N/A ?    Comment:  Procedure: ESOPHAGOGASTRODUODENOSCOPY (EGD);  Surgeon:  ?             Efrain Sella, MD;  Location: [facility] ENDOSCOPY;   ?             Service: Gastroenterology;  Laterality: N/A;  C-19 test  ?             on 01/22/2021 ?Requesting to be the last case due to  her  ?             transportation ?No date: JOINT REPLACEMENT; Left ?    Comment:  knee, hip ?No date: JOINT REPLACEMENT; Bilateral ?    Comment:  knee ?09/21/2015: KNEE ARTHROPLASTY; Right ?    Comment:  Procedure: COMPUTER ASSISTED TOTAL KNEE ARTHROPLASTY;   ?             Surgeon: Dereck Leep, MD;  Location: [facility] ORS;   ?             Service: Orthopedics;  Laterality: Right; ?No date: TONSILLECTOMY ?11/17/2017: TOTAL HIP ARTHROPLASTY; Right ?    Comment:  Procedure: TOTAL HIP ARTHROPLASTY;  Surgeon: Marry Guan,  ?             Laurice Record, MD;  Location: [facility] ORS;  Service: Orthopedics;  ?             Laterality: Right; ? ?BMI   ? Body Mass Index: 46.58 kg/m?  ?  ? ? Reproductive/Obstetrics ?negative OB ROS ? ?  ? ? ? ? ? ? ? ? ? ? ? ? ? ?  ?  ? ? ? ? ? ?Anesthesia Physical ? ?Anesthesia Plan ? ?ASA: III ? ?Anesthesia Plan: General ETT  ? ?Post-op Pain Management: Regional block* and Tylenol PO (pre-op)*  ? ?Induction: Intravenous ? ?PONV Risk Score and Plan: 3 and Ondansetron, Dexamethasone and Midazolam ? ?  Airway Management Planned: Video Laryngoscope Planned ? ?Additional Equipment:  ? ?Intra-op Plan:  ? ?Post-operative Plan: Extubation in OR ? ?Informed Consent: I have reviewed the patients History and Physical, chart, labs and discussed the procedure including the risks, benefits and alternatives for the proposed anesthesia with the patient or authorized representative who has indicated his/her understanding and acceptance.  ? ? ? ?Dental advisory given ? ?Plan Discussed with: Anesthesiologist, CRNA and Surgeon ? ?Anesthesia Plan Comments:   ? ? ? ?Anesthesia Quick Evaluation ? ?

## 2022-04-08 NOTE — Telephone Encounter (Signed)
pt called to cancle appts, states that she is having shoulder surgery 04/24, and set to f/u with PT for following 6 months. ?

## 2022-04-11 ENCOUNTER — Ambulatory Visit: Payer: BC Managed Care – PPO

## 2022-04-11 ENCOUNTER — Other Ambulatory Visit: Payer: Self-pay

## 2022-04-11 ENCOUNTER — Encounter: Payer: Self-pay | Admitting: Orthopedic Surgery

## 2022-04-11 ENCOUNTER — Encounter
Admission: RE | Disposition: A | Discharge status: Home / Self Care | Payer: Self-pay | Source: Ambulatory Visit | Attending: Orthopedic Surgery

## 2022-04-11 ENCOUNTER — Ambulatory Visit: Payer: BC Managed Care – PPO | Admitting: Anesthesiology

## 2022-04-11 ENCOUNTER — Ambulatory Visit
Admission: RE | Admit: 2022-04-11 | Discharge: 2022-04-11 | Disposition: A | Discharge status: Home / Self Care | Payer: BC Managed Care – PPO | Source: Ambulatory Visit | Attending: Orthopedic Surgery | Admitting: Orthopedic Surgery

## 2022-04-11 DIAGNOSIS — M75112 Incomplete rotator cuff tear or rupture of left shoulder, not specified as traumatic: Secondary | ICD-10-CM | POA: Diagnosis present

## 2022-04-11 DIAGNOSIS — Z96643 Presence of artificial hip joint, bilateral: Secondary | ICD-10-CM | POA: Diagnosis not present

## 2022-04-11 DIAGNOSIS — M7552 Bursitis of left shoulder: Secondary | ICD-10-CM | POA: Insufficient documentation

## 2022-04-11 DIAGNOSIS — Z96653 Presence of artificial knee joint, bilateral: Secondary | ICD-10-CM | POA: Insufficient documentation

## 2022-04-11 HISTORY — PX: SHOULDER ARTHROSCOPY: SHX128

## 2022-04-11 LAB — POCT I-STAT, CHEM 8
BUN: 9 mg/dL (ref 6–20)
Calcium, Ion: 1.2 mmol/L (ref 1.15–1.40)
Chloride: 103 mmol/L (ref 98–111)
Creatinine, Ser: 0.5 mg/dL (ref 0.44–1.00)
Glucose, Bld: 113 mg/dL — ABNORMAL HIGH (ref 70–99)
HCT: 48 % — ABNORMAL HIGH (ref 36.0–46.0)
Hemoglobin: 16.3 g/dL — ABNORMAL HIGH (ref 12.0–15.0)
Potassium: 4.3 mmol/L (ref 3.5–5.1)
Sodium: 139 mmol/L (ref 135–145)
TCO2: 24 mmol/L (ref 22–32)

## 2022-04-11 SURGERY — ARTHROSCOPY, SHOULDER
Anesthesia: General | Site: Shoulder | Laterality: Left

## 2022-04-11 MED ORDER — ROCURONIUM BROMIDE 10 MG/ML (PF) SYRINGE
PREFILLED_SYRINGE | INTRAVENOUS | Status: AC
  Filled 2022-04-11: qty 10

## 2022-04-11 MED ORDER — FENTANYL CITRATE (PF) 100 MCG/2ML IJ SOLN
INTRAMUSCULAR | Status: AC
  Administered 2022-04-11: 50 ug via INTRAVENOUS
  Filled 2022-04-11: qty 2

## 2022-04-11 MED ORDER — CHLORHEXIDINE GLUCONATE 0.12 % MT SOLN
OROMUCOSAL | Status: AC
  Administered 2022-04-11: 15 mL via OROMUCOSAL
  Filled 2022-04-11: qty 15

## 2022-04-11 MED ORDER — MIDAZOLAM HCL 2 MG/2ML IJ SOLN
INTRAMUSCULAR | Status: AC
  Administered 2022-04-11: 1 mg via INTRAVENOUS
  Filled 2022-04-11: qty 2

## 2022-04-11 MED ORDER — EPHEDRINE SULFATE (PRESSORS) 50 MG/ML IJ SOLN
INTRAMUSCULAR | Status: DC | PRN
  Administered 2022-04-11: 10 mg via INTRAVENOUS

## 2022-04-11 MED ORDER — ACETAMINOPHEN 500 MG PO TABS
1000.0000 mg | ORAL_TABLET | Freq: Once | ORAL | Status: AC
Start: 2022-04-11 — End: 2022-04-11

## 2022-04-11 MED ORDER — PHENYLEPHRINE HCL-NACL 20-0.9 MG/250ML-% IV SOLN
INTRAVENOUS | Status: AC
  Filled 2022-04-11: qty 250

## 2022-04-11 MED ORDER — MIDAZOLAM HCL 2 MG/2ML IJ SOLN
1.0000 mg | Freq: Once | INTRAMUSCULAR | Status: AC
  Administered 2022-04-11: 1 mg via INTRAVENOUS

## 2022-04-11 MED ORDER — ACETAMINOPHEN 10 MG/ML IV SOLN: 1000.0000 mg | Freq: Once | INTRAVENOUS | Status: DC | PRN

## 2022-04-11 MED ORDER — PHENYLEPHRINE 80 MCG/ML (10ML) SYRINGE FOR IV PUSH (FOR BLOOD PRESSURE SUPPORT)
PREFILLED_SYRINGE | INTRAVENOUS | Status: DC | PRN
  Administered 2022-04-11 (×2): 160 ug via INTRAVENOUS

## 2022-04-11 MED ORDER — OXYCODONE HCL 5 MG PO TABS: 5.0000 mg | ORAL_TABLET | Freq: Once | ORAL | Status: DC | PRN

## 2022-04-11 MED ORDER — FAMOTIDINE 20 MG PO TABS
ORAL_TABLET | ORAL | Status: AC
  Administered 2022-04-11: 20 mg via ORAL
  Filled 2022-04-11: qty 1

## 2022-04-11 MED ORDER — ACETAMINOPHEN 500 MG PO TABS
ORAL_TABLET | ORAL | Status: AC
  Administered 2022-04-11: 1000 mg via ORAL
  Filled 2022-04-11: qty 2

## 2022-04-11 MED ORDER — OXYCODONE HCL 5 MG PO TABS: 5.0000 mg | ORAL_TABLET | ORAL | 0 refills | Status: DC | PRN

## 2022-04-11 MED ORDER — OXYCODONE HCL 5 MG PO TABS
ORAL_TABLET | ORAL | Status: AC
  Administered 2022-04-11: 10 mg via ORAL
  Filled 2022-04-11: qty 2

## 2022-04-11 MED ORDER — ASPIRIN EC 325 MG PO TBEC: 325.0000 mg | DELAYED_RELEASE_TABLET | Freq: Every day | ORAL | 0 refills | Status: AC

## 2022-04-11 MED ORDER — BUPIVACAINE HCL (PF) 0.5 % IJ SOLN
INTRAMUSCULAR | Status: DC | PRN
  Administered 2022-04-11: 50 mg via PERINEURAL

## 2022-04-11 MED ORDER — ONDANSETRON HCL 4 MG/2ML IJ SOLN
INTRAMUSCULAR | Status: DC | PRN
Start: 2022-04-11 — End: 2022-04-11
  Administered 2022-04-11: 4 mg via INTRAVENOUS

## 2022-04-11 MED ORDER — PROPOFOL 10 MG/ML IV BOLUS
INTRAVENOUS | Status: DC | PRN
  Administered 2022-04-11: 200 mg via INTRAVENOUS

## 2022-04-11 MED ORDER — ACETAMINOPHEN 500 MG PO TABS: 1000.0000 mg | ORAL_TABLET | Freq: Three times a day (TID) | ORAL | 2 refills | Status: DC

## 2022-04-11 MED ORDER — PHENYLEPHRINE HCL-NACL 20-0.9 MG/250ML-% IV SOLN
INTRAVENOUS | Status: DC | PRN
  Administered 2022-04-11: 50 ug/min via INTRAVENOUS

## 2022-04-11 MED ORDER — FENTANYL CITRATE (PF) 100 MCG/2ML IJ SOLN
INTRAMUSCULAR | Status: DC | PRN
  Administered 2022-04-11 (×2): 50 ug via INTRAVENOUS

## 2022-04-11 MED ORDER — BUPIVACAINE LIPOSOME 1.3 % IJ SUSP
INTRAMUSCULAR | Status: DC | PRN
  Administered 2022-04-11: 10 mL via PERINEURAL

## 2022-04-11 MED ORDER — EPINEPHRINE PF 1 MG/ML IJ SOLN
INTRAMUSCULAR | Status: AC
  Filled 2022-04-11: qty 4

## 2022-04-11 MED ORDER — OXYCODONE HCL 5 MG/5ML PO SOLN: 5.0000 mg | Freq: Once | ORAL | Status: DC | PRN

## 2022-04-11 MED ORDER — PROMETHAZINE HCL 25 MG/ML IJ SOLN: 6.2500 mg | INTRAMUSCULAR | Status: DC | PRN

## 2022-04-11 MED ORDER — FAMOTIDINE 20 MG PO TABS: 20.0000 mg | ORAL_TABLET | Freq: Once | ORAL | Status: AC

## 2022-04-11 MED ORDER — SUGAMMADEX SODIUM 200 MG/2ML IV SOLN
INTRAVENOUS | Status: DC | PRN
  Administered 2022-04-11: 500 mg via INTRAVENOUS

## 2022-04-11 MED ORDER — CHLORHEXIDINE GLUCONATE 0.12 % MT SOLN: 15.0000 mL | Freq: Once | OROMUCOSAL | Status: AC

## 2022-04-11 MED ORDER — LACTATED RINGERS IV SOLN
INTRAVENOUS | Status: DC | PRN
  Administered 2022-04-11: 12004 mL

## 2022-04-11 MED ORDER — FENTANYL CITRATE (PF) 100 MCG/2ML IJ SOLN
25.0000 ug | INTRAMUSCULAR | Status: DC | PRN
  Administered 2022-04-11: 50 ug via INTRAVENOUS

## 2022-04-11 MED ORDER — PROPOFOL 10 MG/ML IV BOLUS
INTRAVENOUS | Status: AC
  Filled 2022-04-11: qty 20

## 2022-04-11 MED ORDER — ONDANSETRON 4 MG PO TBDP: 4.0000 mg | ORAL_TABLET | Freq: Three times a day (TID) | ORAL | 0 refills | Status: DC | PRN

## 2022-04-11 MED ORDER — LACTATED RINGERS IV SOLN: INTRAVENOUS | Status: DC

## 2022-04-11 MED ORDER — BUPIVACAINE HCL (PF) 0.5 % IJ SOLN
INTRAMUSCULAR | Status: AC
  Filled 2022-04-11: qty 10

## 2022-04-11 MED ORDER — LIDOCAINE HCL (CARDIAC) PF 100 MG/5ML IV SOSY
PREFILLED_SYRINGE | INTRAVENOUS | Status: DC | PRN
  Administered 2022-04-11: 100 mg via INTRAVENOUS

## 2022-04-11 MED ORDER — DROPERIDOL 2.5 MG/ML IJ SOLN: 0.6250 mg | Freq: Once | INTRAMUSCULAR | Status: DC | PRN

## 2022-04-11 MED ORDER — FENTANYL CITRATE (PF) 100 MCG/2ML IJ SOLN
INTRAMUSCULAR | Status: AC
  Filled 2022-04-11: qty 2

## 2022-04-11 MED ORDER — CEFAZOLIN IN SODIUM CHLORIDE 3-0.9 GM/100ML-% IV SOLN
3.0000 g | INTRAVENOUS | Status: AC
  Administered 2022-04-11: 3 g via INTRAVENOUS
  Filled 2022-04-11: qty 100

## 2022-04-11 MED ORDER — DEXAMETHASONE SODIUM PHOSPHATE 10 MG/ML IJ SOLN
INTRAMUSCULAR | Status: DC | PRN
  Administered 2022-04-11: 10 mg via INTRAVENOUS

## 2022-04-11 MED ORDER — OXYCODONE HCL 5 MG PO TABS: 10.0000 mg | ORAL_TABLET | Freq: Once | ORAL | Status: AC

## 2022-04-11 MED ORDER — 0.9 % SODIUM CHLORIDE (POUR BTL) OPTIME
TOPICAL | Status: DC | PRN
  Administered 2022-04-11: 500 mL

## 2022-04-11 MED ORDER — ORAL CARE MOUTH RINSE: 15.0000 mL | Freq: Once | OROMUCOSAL | Status: AC

## 2022-04-11 MED ORDER — SUGAMMADEX SODIUM 500 MG/5ML IV SOLN
INTRAVENOUS | Status: AC
  Filled 2022-04-11: qty 5

## 2022-04-11 MED ORDER — ROCURONIUM BROMIDE 100 MG/10ML IV SOLN
INTRAVENOUS | Status: DC | PRN
  Administered 2022-04-11: 30 mg via INTRAVENOUS
  Administered 2022-04-11: 80 mg via INTRAVENOUS

## 2022-04-11 MED ORDER — BUPIVACAINE LIPOSOME 1.3 % IJ SUSP
INTRAMUSCULAR | Status: AC
  Filled 2022-04-11: qty 10

## 2022-04-11 SURGICAL SUPPLY — 85 items
ADAPTER IRRIG TUBE 2 SPIKE SOL (ADAPTER) ×3 IMPLANT
ADH SKN CLS APL DERMABOND .7 (GAUZE/BANDAGES/DRESSINGS)
ADPR TBG 2 SPK PMP STRL ASCP (ADAPTER) ×1
ANCH SUT BN ASCP DLV (Anchor) ×1 IMPLANT
ANCH SUT RGNRT REGENETEN (Staple) ×1 IMPLANT
ANCH SUT SHRT 12.5 CANN EYLT (Anchor) ×1 IMPLANT
ANCHOR BONE REGENETEN (Anchor) ×1 IMPLANT
ANCHOR SUT BIOCOMP LK 2.9X12.5 (Anchor) ×1 IMPLANT
ANCHOR TENDON REGENETEN (Staple) ×1 IMPLANT
APL PRP STRL LF DISP 70% ISPRP (MISCELLANEOUS) ×2
BLADE OSCILLATING/SAGITTAL (BLADE)
BLADE SHAVER 4.5X7 STR FR (MISCELLANEOUS) ×2 IMPLANT
BLADE SW THK.38XMED LNG THN (BLADE) IMPLANT
BNDG ADH 2 X3.75 FABRIC TAN LF (GAUZE/BANDAGES/DRESSINGS) ×2 IMPLANT
BNDG ADH XL 3.75X2 STRCH LF (GAUZE/BANDAGES/DRESSINGS) ×1
BUR BR 5.5 WIDE MOUTH (BURR) ×1 IMPLANT
CANNULA PART THRD DISP 5.75X7 (CANNULA) IMPLANT
CANNULA PARTIAL THREAD 2X7 (CANNULA) ×2 IMPLANT
CANNULA TWIST IN 8.25X9CM (CANNULA) IMPLANT
CHLORAPREP W/TINT 26 (MISCELLANEOUS) ×3 IMPLANT
COOLER POLAR GLACIER W/PUMP (MISCELLANEOUS) ×2 IMPLANT
COVER LIGHT HANDLE STERIS (MISCELLANEOUS) ×2 IMPLANT
DERMABOND ADVANCED (GAUZE/BANDAGES/DRESSINGS)
DERMABOND ADVANCED .7 DNX12 (GAUZE/BANDAGES/DRESSINGS) IMPLANT
DRAPE INCISE IOBAN 66X45 STRL (DRAPES) ×2 IMPLANT
DRAPE U-SHAPE 47X51 STRL (DRAPES) ×4 IMPLANT
ELECT REM PT RETURN 9FT ADLT (ELECTROSURGICAL) ×2
ELECTRODE REM PT RTRN 9FT ADLT (ELECTROSURGICAL) ×1 IMPLANT
GAUZE SPONGE 4X4 12PLY STRL (GAUZE/BANDAGES/DRESSINGS) ×2 IMPLANT
GAUZE XEROFORM 1X8 LF (GAUZE/BANDAGES/DRESSINGS) ×2 IMPLANT
GLOVE BIOGEL PI IND STRL 8 (GLOVE) ×2 IMPLANT
GLOVE BIOGEL PI INDICATOR 8 (GLOVE) ×2
GLOVE SURG ENC MOIS LTX SZ7.5 (GLOVE) ×4 IMPLANT
GLOVE SURG SYN 8.0 (GLOVE) ×4 IMPLANT
GLOVE SURG SYN 8.0 PF PI (GLOVE) ×2 IMPLANT
GOWN STRL REUS W/ TWL LRG LVL3 (GOWN DISPOSABLE) ×2 IMPLANT
GOWN STRL REUS W/TWL LRG LVL3 (GOWN DISPOSABLE) ×4
GOWN STRL REUS W/TWL XL LVL4 (GOWN DISPOSABLE) ×2 IMPLANT
IMPL REGENETEN MEDIUM (Shoulder) IMPLANT
IMPLANT REGENETEN MEDIUM (Shoulder) ×2 IMPLANT
IV LACTATED RINGER IRRG 3000ML (IV SOLUTION) ×8
IV LR IRRIG 3000ML ARTHROMATIC (IV SOLUTION) ×4 IMPLANT
KIT INSERTION 2.9 PUSHLOCK (KITS) ×1 IMPLANT
KIT STABILIZATION SHOULDER (MISCELLANEOUS) ×2 IMPLANT
KIT TURNOVER KIT A (KITS) ×2 IMPLANT
MANIFOLD NEPTUNE II (INSTRUMENTS) ×4 IMPLANT
MASK FACE SPIDER DISP (MASK) ×2 IMPLANT
MAT ABSORB  FLUID 56X50 GRAY (MISCELLANEOUS) ×2
MAT ABSORB FLUID 56X50 GRAY (MISCELLANEOUS) ×2 IMPLANT
NDL MAYO 6 CRC TAPER PT (NEEDLE) IMPLANT
NDL SCORPION MULTI FIRE (NEEDLE) IMPLANT
NEEDLE HYPO 22GX1.5 SAFETY (NEEDLE) ×2 IMPLANT
NEEDLE MAYO 6 CRC TAPER PT (NEEDLE) IMPLANT
NEEDLE SCORPION MULTI FIRE (NEEDLE) IMPLANT
PACK ARTHROSCOPY SHOULDER (MISCELLANEOUS) ×2 IMPLANT
PAD ABD DERMACEA PRESS 5X9 (GAUZE/BANDAGES/DRESSINGS) ×2 IMPLANT
PAD ARMBOARD 7.5X6 YLW CONV (MISCELLANEOUS) ×4 IMPLANT
PAD WRAPON POLAR SHDR XLG (MISCELLANEOUS) ×1 IMPLANT
PASSER SUT FIRSTPASS SELF (INSTRUMENTS) ×2 IMPLANT
PASSER SUT SWIFTSTITCH HIP CRT (INSTRUMENTS) ×1 IMPLANT
SHAVER BLADE BONE CUTTER  5.5 (BLADE)
SHAVER BLADE BONE CUTTER 5.5 (BLADE) IMPLANT
SLING ULTRA II M (MISCELLANEOUS) ×1 IMPLANT
SPONGE T-LAP 18X18 ~~LOC~~+RFID (SPONGE) ×2 IMPLANT
STRAP SAFETY 5IN WIDE (MISCELLANEOUS) ×2 IMPLANT
SUT ETHILON 3-0 (SUTURE) ×2 IMPLANT
SUT LASSO 90 DEG SD STR (SUTURE) IMPLANT
SUT MNCRL 4-0 (SUTURE)
SUT MNCRL 4-0 27XMFL (SUTURE)
SUT PROLENE 0 CT 2 (SUTURE) ×1 IMPLANT
SUT TICRON 2-0 30IN 311381 (SUTURE) IMPLANT
SUT VIC AB 0 CT1 36 (SUTURE) IMPLANT
SUT VIC AB 2-0 CT2 27 (SUTURE) IMPLANT
SUTURE MNCRL 4-0 27XMF (SUTURE) IMPLANT
SUTURE TAPE 1.3 40 TPR END (SUTURE) IMPLANT
SUTURE TAPE FIBERLINK 1.3 LOOP (SUTURE) IMPLANT
SUTURETAPE 1.3 40 TPR END (SUTURE)
SUTURETAPE FIBERLINK 1.3 LOOP (SUTURE) ×2
SYR 10ML LL (SYRINGE) IMPLANT
TAPE MICROFOAM 4IN (TAPE) ×2 IMPLANT
TUBING INFLOW SET DBFLO PUMP (TUBING) ×2 IMPLANT
TUBING OUTFLOW SET DBLFO PUMP (TUBING) ×2 IMPLANT
WAND WEREWOLF FLOW 90D (MISCELLANEOUS) ×2 IMPLANT
WATER STERILE IRR 500ML POUR (IV SOLUTION) ×1 IMPLANT
WRAPON POLAR PAD SHDR XLG (MISCELLANEOUS) ×2

## 2022-04-11 NOTE — Op Note (Addendum)
SURGERY DATE: 04/11/2022  ? ?PRE-OP DIAGNOSIS:  ?1. Left partial thickness rotator cuff tear ?2. Left biceps tendinopathy ?3. Left subacromial impingement ? ?POST-OP DIAGNOSIS: ?1. Left partial thickness rotator cuff tear ?2. Left biceps tendinopathy ?3. Left subacromial impingement ? ?PROCEDURES:  ?1. Left arthroscopic rotator cuff repair using Regeneten patch ?2. Left arthroscopic biceps tenodesis ?3. Left extensive debridement of shoulder (glenohumeral and subacromial spaces) ?4. Left arthroscopic subacromial decompression ? ?SURGEON: Rosealee Albee, MD ? ?ANESTHESIA: Gen with interscalene block w/Exparel ? ?ESTIMATED BLOOD LOSS: 5cc ? ?DRAINS:  none ? ?TOTAL IV FLUIDS: per anesthesia  ? ?SPECIMENS: none ? ?IMPLANTS:  ?- [employer] Regeneten patch with associated tendon and bone staples ?- Arthrex 2.46mm PushLock anchor (arthroscopic biceps tenodesis) ? ?OPERATIVE FINDINGS:  ?Examination under anesthesia: A careful examination under anesthesia was performed.  Passive range of motion was: FF: 150; ER at side: 45; ER in abduction: 90; IR in abduction: 45.  Anterior load shift: NT.  Posterior load shift: NT.  Sulcus in neutral: NT.  Sulcus in ER: NT.   ? ?Intra-operative findings: A thorough arthroscopic examination of the shoulder was performed.  The findings are: ?1. Biceps tendon: Significant tendinopathy with erythema ?2. Superior labrum: Significant erythema ?3. Posterior labrum and capsule: normal ?4. Inferior capsule and inferior recess: normal ?5. Glenoid cartilage surface: Normal ?6. Supraspinatus attachment: partial thickness tearing of anterior supraspinatus involving approximately 25% of the articular surface and 25% of the bursal surface.  ?7. Posterior rotator cuff attachment: normal  ?8. Humeral head articular cartilage: normal ?9. Rotator interval: Synovitic ?10: Subscapularis tendon: Normal ?11. Anterior labrum: degenerative ?12. IGHL: normal ? ?OPERATIVE REPORT:  ? ?Indications for procedure:  CORENE RESNICK is a 61 y.o. female with over 4 months of shoulder pain she has failed extensive nonoperative management including activity modifications, medical management, exercises, and corticosteroid injections.  Clinical exam and MRI were significant for partial thickness rotator cuff tear, subacromial impingement/bursitis, and biceps tendinopathy. After discussion of risks, benefits, and alternatives to surgery, the patient elected to proceed with above mentioned procedure. The patient understands that use of the Regeneten patch is relatively new and long-term data is unknown. ? ?Procedure in detail: ? ?I identified [name] in the pre-operative holding area.  I marked the operative shoulder with my initials. I reviewed the risks and benefits of the proposed surgical intervention, and the patient wished to proceed.  Anesthesia was then performed with an interscalene block with Exparil.  The patient was transferred to the operative suite and placed in the beach chair position.   ? ?SCDs were placed on the lower extremities. Appropriate IV antibiotics were administered. The operative upper extremity was then prepped and draped in standard fashion. A time out was performed confirming the correct extremity, correct patient and correct procedure.  ? ?I then created a standard posterior portal with an 11 blade. The glenohumeral joint was easily entered with a blunt trochar and the arthroscope introduced. The findings of diagnostic arthroscopy are described above. I debrided the degenerative and erythematous anterior labrum and superior labrum.  I also debrided and coagulated the inflamed synovium to obtain hemostasis and reduce the risk of post-operative swelling using an Arthrocare radiofrequency device.   ? ?I then turned my attention to the arthroscopic biceps tenodesis.  I used the loop n tack technique to pass a fiber tape through the biceps in a locked fashion adjacent to the biceps anchor.  The biceps  tendon was cut at its attachment  on the superior labrum.  The remnant stump was debrided down to a stable base on the biceps anchor complex using an oscillating shaver.  A hole for a 2.9 mm Arthrex PushLock was drilled in the bicipital groove just superior to the subscapularis tendon insertion.  The fiber tape was loaded onto the PushLock anchor and impacted into place into the previously drilled hole in the bicipital groove.  This appropriately secured the biceps into the bicipital groove and took it off of tension. ? ?An oscillating shaver was then used to debride the frayed portion of the supraspinatus in the area of the partial-thickness tear on the articular side.  A spinal needle was placed in the central portion of the partial thickness rotator cuff tear and an 0-PDS suture was passed through the needle and retrieved out of the anterior portal to mark this region. ? ?Next, the arthroscope was then introduced into the subacromial space. A direct lateral portal was created with an 11-blade after spinal needle localization. An extensive subacromial bursectomy and debridement was performed using a combination of the shaver and Arthrocare wand. The entire acromial undersurface was exposed and the CA ligament was subperiosteally elevated to expose the prominent anterior acromial hook. A burr was used to create a flat anterior and lateral aspect of the acromion, converting it from a Type 2 to a Type 1 acromion. Care was made to keep the deltoid fascia intact. ? ?Next, a switching stick was used to probe the bursal side of the rotator cuff in the region of the previously passed 0 PDS suture.  Next, the frayed bursal sided tearing of the supraspinatus was debrided with an oscillating shaver.  I examined the remainder of the supraspinatus with a switching stick and attempted to push this through the rotator cuff.  Rotator cuff was intact as the switching stick could not be pushed through into the glenohumeral joint.   This confirmed appropriate partial-thickness nature of the tear. Given this, we decided to proceed with Regeneten patch placement. The Regeneten patch delivery gun was placed appropriately and the patch was delivered over the supraspinatus tendon using the PDS suture as a reference. Tendon staples were placed medially, anteriorly, and posteriorly after making appropriate stab incision for portal placement. Two bone staples were then placed laterally. The patch was then probed to confirm appropriate stability.  The shoulder was internally and externally rotated, and the patch was noted to move appropriately. ? ?Arthroscopic fluid was evacuated from the joint.  The portals were closed with 3-0 Nylon. Xeroform was applied to the portals. A sterile dressing was applied, followed by a Polar Care sleeve and a SlingShot shoulder immobilizer/sling. The patient awoke from anesthesia without difficulty and was transferred to the PACU in stable condition.  ? ?COMPLICATIONS: none ? ?DISPOSITION: plan for discharge home after recovery in PACU ? ?POSTOPERATIVE PLAN: Remain in sling (except hygiene and elbow/wrist/hand RoM exercises as instructed by PT) x 2 weeks and NWB for this time. Wean from sling as tolerated after 2 weeks.  PT to begin 3-4 days after surgery. Use Regeneten Patch rehab protocol: For the first 4 weeks, forward flexion is limited to 100?Marland Kitchen External rotation with the arm by the side is allowed, but abduction-external rotation is not allowed for the first 6 weeks. After 6 weeks, no restrictions on motion or arm use.  ASA for DVT prophylaxis.  Follow-up as an outpatient as scheduled in approximately 2 weeks. ? ?

## 2022-04-11 NOTE — Discharge Instructions (Addendum)
Post-Op Instructions - Regeneten Patch ? ?1. Bracing: You will wear a shoulder immobilizer or sling for 1 week.  ? ?2. Driving: No driving for at least 2 weeks post-op.  ? ?3. Activity: Progress to motion as tolerated, moving from passive to active-assisted to active motion. For the first 4 weeks, forward flexion is limited to 100?Marland Kitchen. External rotation with the arm by the side is allowed, but abduction-external rotation is not allowed for the first 6 weeks. After 6 weeks, no restrictions on motion or arm use. Return to normal activities normally takes 4-6 months on average. If rehab goes very well, may be able to do most activities at 4 months, except overhead or contact sports. ? ?4. Physical Therapy: Begins 3-4 days after surgery ? ?5. Medications:  ?- You will be provided a prescription for narcotic pain medicine. After surgery, take 1-2 narcotic tablets every 4 hours if needed for severe pain.  ?- A prescription for anti-nausea medication will be provided in case the narcotic medicine causes nausea - take 1 tablet every 6 hours only if nauseated.   ?- Take tylenol 1000 mg (2 Extra Strength tablets or 3 regular strength) every 8 hours for pain.  May decrease or stop tylenol 5 days after surgery if you are having minimal pain. ?- Take ASA 325mg /day x 2 weeks to help prevent DVTs/PEs (blood clots).  ?- DO NOT take ANY nonsteroidal anti-inflammatory pain medications (Advil, Motrin, Ibuprofen, Aleve, Naproxen, or Naprosyn). These medicines can inhibit healing of your shoulder repair.  ? ? ?If you are taking prescription medication for anxiety, depression, insomnia, muscle spasm, chronic pain, or for attention deficit disorder, you are advised that you are at a higher risk of adverse effects with use of narcotics post-op, including narcotic addiction/dependence, depressed breathing, death. ?If you use non-prescribed substances: alcohol, marijuana, cocaine, heroin, methamphetamines, etc., you are at a higher risk of  adverse effects with use of narcotics post-op, including narcotic addiction/dependence, depressed breathing, death. ?You are advised that taking > 50 morphine milligram equivalents (MME) of narcotic pain medication per day results in twice the risk of overdose or death. For your prescription provided: oxycodone 5 mg - taking more than 6 tablets per day would result in > 50 morphine milligram equivalents (MME) of narcotic pain medication. ?Be advised that we will prescribe narcotics short-term, for acute post-operative pain only - 3 weeks for major operations such as shoulder repair/reconstruction surgeries.  ? ? ? ?6. Post-Op Appointment: ? ?Your first post-op appointment will be 10-14 days post-op. ? ?7. Work or School: For most, but not all procedures, we advise staying out of work or school for at least 1 to 2 weeks in order to recover from the stress of surgery and to allow time for healing.  ? ?If you need a work or school note this can be provided.  ? ?8. Smoking: If you are a smoker, you need to refrain from smoking in the postoperative period. The nicotine in cigarettes will inhibit healing of your shoulder repair and decrease the chance of successful repair. Similarly, nicotine containing products (gum, patches) should be avoided.  ? ?Post-operative Brace: ?Apply and remove the brace you received as you were instructed to at the time of fitting and as described in detail as the brace?s instructions for use indicate.  Wear the brace for the period of time prescribed by your physician.  The brace can be cleaned with soap and water and allowed to air dry only.  Should the brace  result in increased pain, decreased feeling (numbness/tingling), increased swelling or an overall worsening of your medical condition, please contact your doctor immediately.  If an emergency situation occurs as a result of wearing the brace after normal business hours, please dial 911 and seek immediate medical attention.  Let your  doctor know if you have any further questions about the brace issued to you. ?Refer to the shoulder sling instructions for use if you have any questions regarding the correct fit of your shoulder sling.  ?[facility] Customer Care for Troubleshooting: [phone] ? ?Video that illustrates how to properly use a shoulder sling: ?"Instructions for Proper Use of an Orthopaedic Sling" ?http://bass.com/ ? ? ? ?AMBULATORY SURGERY  ?DISCHARGE INSTRUCTIONS ? ? ?The drugs that you were given will stay in your system until tomorrow so for the next 24 hours you should not: ? ?Drive an automobile ?Make any legal decisions ?Drink any alcoholic beverage ? ? ?You may resume regular meals tomorrow.  Today it is better to start with liquids and gradually work up to solid foods. ? ?You may eat anything you prefer, but it is better to start with liquids, then soup and crackers, and gradually work up to solid foods. ? ? ?Please notify your doctor immediately if you have any unusual bleeding, trouble breathing, redness and pain at the surgery site, drainage, fever, or pain not relieved by medication. ? ? ? ?Additional Instructions: ? ? ? ? ? ? ? ?Please contact your physician with any problems or Same Day Surgery at [phone], Monday through Friday 6 am to 4 pm, or [place] at [facility] number at [phone].  ? ? ?   Interscalene Nerve Block with Exparel ? ? For your surgery you have received an Interscalene Nerve Block with Exparel. ?Nerve Blocks affect many types of nerves, including nerves that control movement, pain and normal sensation.  You may experience feelings such as numbness, tingling, heaviness, weakness or the inability to move your arm or the feeling or sensation that your arm has "fallen asleep". ?A nerve block with Exparel can last up to 5 days.  Usually the weakness wears off first.  The tingling and heaviness usually wear off next.  Finally you may start to notice pain.  Keep in mind  that this may occur in any order.  Once a nerve block starts to wear off it is usually completely gone within 60 minutes. ?ISNB may cause mild shortness of breath, a hoarse voice, blurry vision, unequal pupils, or drooping of the face on the same side as the nerve block.  These symptoms will usually resolve with the numbness.  Very rarely the procedure itself can cause mild seizures. ?If needed, your surgeon will give you a prescription for pain medication.  It will take about 60 minutes for the oral pain medication to become fully effective.  So, it is recommended that you start taking this medication before the nerve block first begins to wear off, or when you first begin to feel discomfort. ?Take your pain medication only as prescribed.  Pain medication can cause sedation and decrease your breathing if you take more than you need for the level of pain that you have. ?Nausea is a common side effect of many pain medications.  You may want to eat something before taking your pain medicine to prevent nausea. ?After an Interscalene nerve block, you cannot feel pain, pressure or extremes in temperature in the effected arm.  Because your arm is numb it is at an  increased risk for injury.  To decrease the possibility of injury, please practice the following: ? ?While you are awake change the position of your arm frequently to prevent too much pressure on any one area for prolonged periods of time. ? If you have a cast or tight dressing, check the color or your fingers every couple of hours.  Call your surgeon with the appearance of any discoloration (white or blue). ?If you are given a sling to wear before you go home, please wear it  at all times until the block has completely worn off.  Do not get up at night without your sling. ?Please contact [facility] or your surgeon if you do not begin to regain sensation after 7 days from the surgery.  Anesthesia may be contacted by calling the Same Day Surgery Department,  Mon. through Fri., 6 am to 4 pm at [phone].   ?If you experience any other problems or concerns, please contact your surgeon's office. ?If you experience severe or prolonged shortness of breath go to the n

## 2022-04-11 NOTE — Transfer of Care (Signed)
Immediate Anesthesia Transfer of Care Note ? ?Patient: Tamara Dodson ? ?Procedure(s) Performed: Left shoulder arthroscopic regeneten patch, biceps tenodesis, subacromial decompression (Left: Shoulder) ? ?Patient Location: PACU ? ?Anesthesia Type:General ? ?Level of Consciousness: awake, drowsy and patient cooperative ? ?Airway & Oxygen Therapy: Patient Spontanous Breathing ? ?Post-op Assessment: Report given to RN and Post -op Vital signs reviewed and stable ? ?Post vital signs: Reviewed and stable ? ?Last Vitals:  ?Vitals Value Taken Time  ?BP 123/66 04/11/22 1500  ?Temp    ?Pulse 65 04/11/22 1502  ?Resp 27 04/11/22 1502  ?SpO2 99 % 04/11/22 1502  ?Vitals shown include unvalidated device data. ? ?Last Pain:  ?Vitals:  ? 04/11/22 1147  ?TempSrc: Oral  ?PainSc: 8   ?   ? ?  ? ?Complications: No notable events documented. ?

## 2022-04-11 NOTE — Anesthesia Procedure Notes (Signed)
Anesthesia Regional Block: Interscalene brachial plexus block  ? ?Pre-Anesthetic Checklist: , timeout performed,  Correct Patient, Correct Site, Correct Laterality,  Correct Procedure, Correct Position, site marked,  Risks and benefits discussed,  Surgical consent,  Pre-op evaluation,  At surgeon's request and post-op pain management ? ?Laterality: Upper and Left ? ?Prep: chloraprep     ?  ?Needles:  ?Injection technique: Single-shot ? ?Needle Type: Stimiplex   ? ? ?Needle Length: 9cm  ?Needle Gauge: 22  ? ? ? ?Additional Needles: ? ? ?Procedures:,,,, ultrasound used (permanent image in chart),,    ?Narrative:  ?Start time: 04/11/2022 12:20 PM ?End time: 04/11/2022 12:25 PM ?Injection made incrementally with aspirations every 20 mL. ? ?Performed by: Personally  ?Anesthesiologist: Foye Deer, MD ? ?Additional Notes: ?Patient consented for risk and benefits of nerve block including but not limited to nerve damage, failed block, bleeding and infection.  Patient voiced understanding. ? ?Functioning IV was confirmed and monitors were applied.  Timeout done prior to procedure and prior to any sedation being given to the patient.  Patient confirmed procedure site prior to any sedation given to the patient.  A 80mm 22ga Stimuplex needle was used. Sterile prep,hand hygiene and sterile gloves were used.  Minimal sedation used for procedure.  No paresthesia endorsed by patient during the procedure.  Negative aspiration and negative test dose prior to incremental administration of local anesthetic. The patient tolerated the procedure well with no immediate complications. ? ? ? ?

## 2022-04-11 NOTE — Anesthesia Procedure Notes (Signed)
Procedure Name: Intubation ?Date/Time: 04/11/2022 1:14 PM ?Performed by: Aline Brochure, CRNA ?Pre-anesthesia Checklist: Patient identified, Patient being monitored, Timeout performed, Emergency Drugs available and Suction available ?Patient Re-evaluated:Patient Re-evaluated prior to induction ?Oxygen Delivery Method: Circle system utilized ?Preoxygenation: Pre-oxygenation with 100% oxygen ?Induction Type: IV induction ?Ventilation: Mask ventilation without difficulty ?Laryngoscope Size: 3 and McGraph ?Grade View: Grade I ?Tube type: Oral ?Tube size: 7.0 mm ?Number of attempts: 1 ?Airway Equipment and Method: Stylet and Video-laryngoscopy ?Placement Confirmation: ETT inserted through vocal cords under direct vision, positive ETCO2 and breath sounds checked- equal and bilateral ?Secured at: 22 cm ?Tube secured with: Tape ?Dental Injury: Teeth and Oropharynx as per pre-operative assessment  ? ? ? ? ?

## 2022-04-11 NOTE — H&P (Signed)
Paper H&P to be scanned into permanent record. H&P reviewed. No significant changes noted.  

## 2022-04-12 ENCOUNTER — Encounter: Payer: Self-pay | Admitting: Orthopedic Surgery

## 2022-04-12 ENCOUNTER — Inpatient Hospital Stay: Payer: BC Managed Care – PPO

## 2022-04-12 NOTE — Anesthesia Postprocedure Evaluation (Signed)
Anesthesia Post Note ? ?Patient: Tamara Dodson ? ?Procedure(s) Performed: Left shoulder arthroscopic regeneten patch, biceps tenodesis, subacromial decompression (Left: Shoulder) ? ?Patient location during evaluation: PACU ?Anesthesia Type: General ?Level of consciousness: awake and alert ?Pain management: pain level controlled ?Vital Signs Assessment: post-procedure vital signs reviewed and stable ?Respiratory status: spontaneous breathing, nonlabored ventilation and respiratory function stable ?Cardiovascular status: blood pressure returned to baseline and stable ?Postop Assessment: no apparent nausea or vomiting ?Anesthetic complications: no ? ? ?No notable events documented. ? ? ?Last Vitals:  ?Vitals:  ? 04/11/22 1616 04/11/22 1628  ?BP: (!) 109/59 111/65  ?Pulse: 79 68  ?Resp: 16 18  ?Temp: 36.7 ?C   ?SpO2: 93% 95%  ?  ?Last Pain:  ?Vitals:  ? 04/11/22 1628  ?TempSrc:   ?PainSc: 2   ? ? ?  ?  ?  ?  ?  ?  ? ?Foye Deer ? ? ? ? ?

## 2022-04-13 ENCOUNTER — Inpatient Hospital Stay: Payer: BC Managed Care – PPO | Admitting: Oncology

## 2022-04-15 ENCOUNTER — Ambulatory Visit: Admitting: Oncology

## 2022-04-15 ENCOUNTER — Encounter

## 2022-05-12 ENCOUNTER — Encounter: Payer: Self-pay | Admitting: Oncology

## 2022-06-27 ENCOUNTER — Other Ambulatory Visit: Payer: Self-pay | Admitting: Orthopedic Surgery

## 2022-06-27 DIAGNOSIS — M75112 Incomplete rotator cuff tear or rupture of left shoulder, not specified as traumatic: Secondary | ICD-10-CM

## 2022-06-27 DIAGNOSIS — Z9889 Other specified postprocedural states: Secondary | ICD-10-CM

## 2022-07-01 ENCOUNTER — Ambulatory Visit
Admission: RE | Admit: 2022-07-01 | Discharge: 2022-07-01 | Disposition: A | Discharge status: Home / Self Care | Payer: BC Managed Care – PPO | Source: Ambulatory Visit | Attending: Orthopedic Surgery | Admitting: Orthopedic Surgery

## 2022-07-01 DIAGNOSIS — M75112 Incomplete rotator cuff tear or rupture of left shoulder, not specified as traumatic: Secondary | ICD-10-CM

## 2022-07-01 DIAGNOSIS — Z9889 Other specified postprocedural states: Secondary | ICD-10-CM

## 2022-07-14 ENCOUNTER — Other Ambulatory Visit: Payer: Self-pay | Admitting: Family Medicine

## 2022-07-14 DIAGNOSIS — Z1231 Encounter for screening mammogram for malignant neoplasm of breast: Secondary | ICD-10-CM

## 2022-08-04 ENCOUNTER — Other Ambulatory Visit: Payer: Self-pay | Admitting: Orthopedic Surgery

## 2022-08-08 ENCOUNTER — Encounter: Payer: Self-pay | Admitting: Orthopedic Surgery

## 2022-08-09 ENCOUNTER — Ambulatory Visit
Admission: RE | Admit: 2022-08-09 | Discharge: 2022-08-09 | Disposition: A | Discharge status: Home / Self Care | Payer: BC Managed Care – PPO | Source: Ambulatory Visit | Attending: Family Medicine | Admitting: Family Medicine

## 2022-08-09 DIAGNOSIS — Z1231 Encounter for screening mammogram for malignant neoplasm of breast: Secondary | ICD-10-CM | POA: Insufficient documentation

## 2022-08-11 ENCOUNTER — Other Ambulatory Visit: Payer: Self-pay

## 2022-08-11 ENCOUNTER — Ambulatory Visit
Admission: RE | Admit: 2022-08-11 | Discharge: 2022-08-11 | Disposition: A | Discharge status: Home / Self Care | Payer: BC Managed Care – PPO | Attending: Orthopedic Surgery | Admitting: Orthopedic Surgery

## 2022-08-11 ENCOUNTER — Encounter: Payer: Self-pay | Admitting: Orthopedic Surgery

## 2022-08-11 ENCOUNTER — Ambulatory Visit: Payer: BC Managed Care – PPO | Admitting: General Practice

## 2022-08-11 ENCOUNTER — Encounter
Admission: RE | Disposition: A | Discharge status: Home / Self Care | Payer: Self-pay | Source: Home / Self Care | Attending: Orthopedic Surgery

## 2022-08-11 DIAGNOSIS — K7400 Hepatic fibrosis, unspecified: Secondary | ICD-10-CM | POA: Diagnosis not present

## 2022-08-11 DIAGNOSIS — M19012 Primary osteoarthritis, left shoulder: Secondary | ICD-10-CM | POA: Diagnosis not present

## 2022-08-11 DIAGNOSIS — Z96643 Presence of artificial hip joint, bilateral: Secondary | ICD-10-CM | POA: Insufficient documentation

## 2022-08-11 DIAGNOSIS — Z6841 Body Mass Index (BMI) 40.0 and over, adult: Secondary | ICD-10-CM | POA: Diagnosis not present

## 2022-08-11 DIAGNOSIS — Z96653 Presence of artificial knee joint, bilateral: Secondary | ICD-10-CM | POA: Diagnosis not present

## 2022-08-11 DIAGNOSIS — D751 Secondary polycythemia: Secondary | ICD-10-CM | POA: Insufficient documentation

## 2022-08-11 DIAGNOSIS — I10 Essential (primary) hypertension: Secondary | ICD-10-CM | POA: Insufficient documentation

## 2022-08-11 DIAGNOSIS — M25812 Other specified joint disorders, left shoulder: Secondary | ICD-10-CM | POA: Diagnosis present

## 2022-08-11 DIAGNOSIS — M75112 Incomplete rotator cuff tear or rupture of left shoulder, not specified as traumatic: Secondary | ICD-10-CM | POA: Insufficient documentation

## 2022-08-11 DIAGNOSIS — D802 Selective deficiency of immunoglobulin A [IgA]: Secondary | ICD-10-CM | POA: Diagnosis not present

## 2022-08-11 DIAGNOSIS — Z01818 Encounter for other preprocedural examination: Secondary | ICD-10-CM

## 2022-08-11 DIAGNOSIS — Z148 Genetic carrier of other disease: Secondary | ICD-10-CM | POA: Insufficient documentation

## 2022-08-11 DIAGNOSIS — Z981 Arthrodesis status: Secondary | ICD-10-CM | POA: Diagnosis not present

## 2022-08-11 DIAGNOSIS — R519 Headache, unspecified: Secondary | ICD-10-CM | POA: Diagnosis not present

## 2022-08-11 HISTORY — DX: Non-celiac gluten sensitivity: K90.41

## 2022-08-11 HISTORY — PX: SHOULDER ARTHROSCOPY WITH OPEN ROTATOR CUFF REPAIR: SHX6092

## 2022-08-11 SURGERY — ARTHROSCOPY, SHOULDER WITH REPAIR, ROTATOR CUFF, OPEN
Anesthesia: Regional | Site: Shoulder | Laterality: Left

## 2022-08-11 MED ORDER — PROPOFOL 10 MG/ML IV BOLUS
INTRAVENOUS | Status: DC | PRN
  Administered 2022-08-11: 50 mg via INTRAVENOUS
  Administered 2022-08-11: 150 mg via INTRAVENOUS

## 2022-08-11 MED ORDER — BUPIVACAINE HCL (PF) 0.5 % IJ SOLN
INTRAMUSCULAR | Status: DC | PRN
  Administered 2022-08-11: 10 mL via PERINEURAL

## 2022-08-11 MED ORDER — DROPERIDOL 2.5 MG/ML IJ SOLN: 0.6250 mg | Freq: Once | INTRAMUSCULAR | Status: DC | PRN

## 2022-08-11 MED ORDER — PROMETHAZINE HCL 25 MG/ML IJ SOLN: 6.2500 mg | INTRAMUSCULAR | Status: DC | PRN

## 2022-08-11 MED ORDER — FENTANYL CITRATE (PF) 100 MCG/2ML IJ SOLN
INTRAMUSCULAR | Status: DC | PRN
  Administered 2022-08-11 (×2): 50 ug via INTRAVENOUS

## 2022-08-11 MED ORDER — ROCURONIUM BROMIDE 100 MG/10ML IV SOLN
INTRAVENOUS | Status: DC | PRN
  Administered 2022-08-11: 50 mg via INTRAVENOUS

## 2022-08-11 MED ORDER — DEXAMETHASONE SODIUM PHOSPHATE 10 MG/ML IJ SOLN
INTRAMUSCULAR | Status: DC | PRN
  Administered 2022-08-11: 10 mg via INTRAVENOUS

## 2022-08-11 MED ORDER — ASPIRIN 325 MG PO TBEC: 325.0000 mg | DELAYED_RELEASE_TABLET | Freq: Every day | ORAL | 0 refills | Status: AC

## 2022-08-11 MED ORDER — SUGAMMADEX SODIUM 500 MG/5ML IV SOLN
INTRAVENOUS | Status: DC | PRN
  Administered 2022-08-11: 300 mg via INTRAVENOUS

## 2022-08-11 MED ORDER — LACTATED RINGERS IV SOLN: INTRAVENOUS | Status: DC

## 2022-08-11 MED ORDER — LIDOCAINE HCL 1 % IJ SOLN
INTRAMUSCULAR | Status: DC | PRN
  Administered 2022-08-11: 1 mL

## 2022-08-11 MED ORDER — FENTANYL CITRATE PF 50 MCG/ML IJ SOSY: 25.0000 ug | PREFILLED_SYRINGE | INTRAMUSCULAR | Status: DC | PRN

## 2022-08-11 MED ORDER — OXYCODONE HCL 5 MG PO TABS: 5.0000 mg | ORAL_TABLET | Freq: Once | ORAL | Status: DC | PRN

## 2022-08-11 MED ORDER — BUPIVACAINE LIPOSOME 1.3 % IJ SUSP
INTRAMUSCULAR | Status: DC | PRN
  Administered 2022-08-11: 10 mL via PERINEURAL

## 2022-08-11 MED ORDER — OXYCODONE HCL 5 MG PO TABS: 5.0000 mg | ORAL_TABLET | ORAL | 0 refills | Status: DC | PRN

## 2022-08-11 MED ORDER — CEFAZOLIN IN SODIUM CHLORIDE 3-0.9 GM/100ML-% IV SOLN
3.0000 g | INTRAVENOUS | Status: AC
  Administered 2022-08-11: 3 g via INTRAVENOUS

## 2022-08-11 MED ORDER — LIDOCAINE HCL (CARDIAC) PF 100 MG/5ML IV SOSY
PREFILLED_SYRINGE | INTRAVENOUS | Status: DC | PRN
  Administered 2022-08-11: 100 mg via INTRAVENOUS

## 2022-08-11 MED ORDER — ONDANSETRON 4 MG PO TBDP
4.0000 mg | ORAL_TABLET | Freq: Three times a day (TID) | ORAL | 0 refills | Status: DC | PRN
Start: 2022-08-11 — End: 2022-10-20

## 2022-08-11 MED ORDER — DEXMEDETOMIDINE (PRECEDEX) IN NS 20 MCG/5ML (4 MCG/ML) IV SYRINGE
PREFILLED_SYRINGE | INTRAVENOUS | Status: DC | PRN
  Administered 2022-08-11 (×4): 8 ug via INTRAVENOUS

## 2022-08-11 MED ORDER — LIDOCAINE HCL 4 % EX SOLN
CUTANEOUS | Status: DC | PRN
  Administered 2022-08-11: 5 mL via TOPICAL

## 2022-08-11 MED ORDER — OXYCODONE HCL 5 MG/5ML PO SOLN: 5.0000 mg | Freq: Once | ORAL | Status: DC | PRN

## 2022-08-11 MED ORDER — LACTATED RINGERS IR SOLN
Status: DC | PRN
  Administered 2022-08-11: 12000 mL
  Administered 2022-08-11: 3000 mL
  Administered 2022-08-11: 6000 mL

## 2022-08-11 MED ORDER — ACETAMINOPHEN 10 MG/ML IV SOLN: 1000.0000 mg | Freq: Once | INTRAVENOUS | Status: DC | PRN

## 2022-08-11 MED ORDER — ONDANSETRON HCL 4 MG/2ML IJ SOLN
INTRAMUSCULAR | Status: DC | PRN
  Administered 2022-08-11: 4 mg via INTRAVENOUS

## 2022-08-11 MED ORDER — KETOROLAC TROMETHAMINE 30 MG/ML IJ SOLN
INTRAMUSCULAR | Status: DC | PRN
  Administered 2022-08-11: 30 mg via INTRAVENOUS

## 2022-08-11 MED ORDER — LACTATED RINGERS IV SOLN
INTRAVENOUS | Status: DC | PRN
  Administered 2022-08-11: 4 mL

## 2022-08-11 SURGICAL SUPPLY — 51 items
ADH SKN CLS APL DERMABOND .7 (GAUZE/BANDAGES/DRESSINGS) ×1
ADPR IRR PORT MULTIBAG TUBE (MISCELLANEOUS) ×1
APL PRP STRL LF DISP 70% ISPRP (MISCELLANEOUS) ×1
BLADE SHAVER 4.5X7 STR FR (MISCELLANEOUS) ×1 IMPLANT
BUR BR 5.5 WIDE MOUTH (BURR) ×1 IMPLANT
CANNULA PART THRD DISP 5.75X7 (CANNULA) ×1 IMPLANT
CANNULA PARTIAL THREAD 2X7 (CANNULA) ×1 IMPLANT
CANNULA TWIST IN 8.25X7CM (CANNULA) IMPLANT
CHLORAPREP W/TINT 26 (MISCELLANEOUS) ×1 IMPLANT
COOLER POLAR GLACIER W/PUMP (MISCELLANEOUS) ×1 IMPLANT
COVER LIGHT HANDLE UNIVERSAL (MISCELLANEOUS) ×2 IMPLANT
DERMABOND ADVANCED (GAUZE/BANDAGES/DRESSINGS) ×1
DERMABOND ADVANCED .7 DNX12 (GAUZE/BANDAGES/DRESSINGS) ×1 IMPLANT
DRAPE INCISE IOBAN 66X45 STRL (DRAPES) ×1 IMPLANT
DRAPE U-SHAPE 48X52 POLY STRL (PACKS) ×1 IMPLANT
DRSG TEGADERM 4X4.75 (GAUZE/BANDAGES/DRESSINGS) ×3 IMPLANT
ELECT REM PT RETURN 9FT ADLT (ELECTROSURGICAL) ×1
ELECTRODE REM PT RTRN 9FT ADLT (ELECTROSURGICAL) ×1 IMPLANT
GAUZE SPONGE 4X4 12PLY STRL (GAUZE/BANDAGES/DRESSINGS) ×1 IMPLANT
GAUZE XEROFORM 1X8 LF (GAUZE/BANDAGES/DRESSINGS) ×1 IMPLANT
GLOVE SRG 8 PF TXTR STRL LF DI (GLOVE) ×3 IMPLANT
GLOVE SURG ENC MOIS LTX SZ7.5 (GLOVE) ×5 IMPLANT
GLOVE SURG UNDER POLY LF SZ8 (GLOVE) ×3
GOWN STRL REIN 2XL XLG LVL4 (GOWN DISPOSABLE) ×1 IMPLANT
GOWN STRL REUS W/ TWL LRG LVL3 (GOWN DISPOSABLE) ×3 IMPLANT
GOWN STRL REUS W/TWL LRG LVL3 (GOWN DISPOSABLE) ×3
IMP SYSTEM BRIDGE 4.75X19.1 (Anchor) ×1 IMPLANT
IMPL SYSTEM BRIDGE 4.75X19.1 (Anchor) IMPLANT
IV LACTATED RINGER IRRG 3000ML (IV SOLUTION) ×7
IV LR IRRIG 3000ML ARTHROMATIC (IV SOLUTION) ×6 IMPLANT
KIT STABILIZATION SHOULDER (MISCELLANEOUS) ×1 IMPLANT
KIT TURNOVER KIT A (KITS) ×1 IMPLANT
MANIFOLD 4PT FOR NEPTUNE1 (MISCELLANEOUS) ×1 IMPLANT
MASK FACE SPIDER DISP (MASK) ×1 IMPLANT
MAT ABSORB  FLUID 56X50 GRAY (MISCELLANEOUS) ×2
MAT ABSORB FLUID 56X50 GRAY (MISCELLANEOUS) ×2 IMPLANT
NDL SPNL 18GX3.5 QUINCKE PK (NEEDLE) IMPLANT
NEEDLE SPNL 18GX3.5 QUINCKE PK (NEEDLE) ×1 IMPLANT
PACK ARTHROSCOPY SHOULDER (MISCELLANEOUS) ×1 IMPLANT
PAD ABD DERMACEA PRESS 5X9 (GAUZE/BANDAGES/DRESSINGS) ×2 IMPLANT
PAD WRAPON POLAR SHDR XLG (MISCELLANEOUS) ×1 IMPLANT
PASSER SUT FIRSTPASS SELF (INSTRUMENTS) IMPLANT
SET Y ADAPTER MULIT-BAG IRRIG (MISCELLANEOUS) IMPLANT
SUT ETHILON 3-0 (SUTURE) ×1 IMPLANT
SUT PDS AB 0 CT1 27 (SUTURE) IMPLANT
TAPE MICROFOAM 4IN (TAPE) ×1 IMPLANT
TUBING CONNECTING 10 (TUBING) ×1 IMPLANT
TUBING INFLOW SET DBFLO PUMP (TUBING) ×1 IMPLANT
TUBING OUTFLOW SET DBLFO PUMP (TUBING) ×1 IMPLANT
WAND WEREWOLF FLOW 90D (MISCELLANEOUS) ×1 IMPLANT
WRAPON POLAR PAD SHDR XLG (MISCELLANEOUS) ×1

## 2022-08-11 NOTE — Anesthesia Postprocedure Evaluation (Signed)
Anesthesia Post Note  Patient: Tamara Dodson  Procedure(s) Performed: Left revision shoulder arthroscopic rotator cuff repair and extensive glenohumeral debridement (Left: Shoulder)     Patient location during evaluation: PACU Anesthesia Type: General Level of consciousness: awake and alert Pain management: pain level controlled Vital Signs Assessment: post-procedure vital signs reviewed and stable Respiratory status: spontaneous breathing, nonlabored ventilation and respiratory function stable Cardiovascular status: blood pressure returned to baseline and stable Postop Assessment: no apparent nausea or vomiting Anesthetic complications: no   No notable events documented.  Foye Deer

## 2022-08-11 NOTE — Anesthesia Preprocedure Evaluation (Signed)
Anesthesia Evaluation  Patient identified by MRN, date of birth, ID band Patient awake    Reviewed: Allergy & Precautions, H&P , NPO status , Patient's Chart, lab work & pertinent test results  History of Anesthesia Complications Negative for: history of anesthetic complications  Airway Mallampati: III  TM Distance: >3 FB Neck ROM: limited   Comment: Severely limited neck extension Dental  (+) Missing   Pulmonary neg pulmonary ROS, neg sleep apnea, neg COPD,    breath sounds clear to auscultation       Cardiovascular Exercise Tolerance: Good hypertension, Pt. 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 Pt undergoes intermittent phlebotomy   Anesthesia Other Findings Past Medical History: No date: Anemia No date: Arthritis     Comment:  osteoarthrosis No date: Cirrhosis [facility])     Comment:  seeing Dr Cathie Hoops No date: Fatty liver No date: Hypertension 07/18/2019: Iron overload No date: Lumbago No date: Lymph edema     Comment:  lower extremities No date: Morbid obesity (HCC)  Past Surgical History: No date: CHOLECYSTECTOMY 01/25/2021: ESOPHAGOGASTRODUODENOSCOPY; N/A     Comment:  Procedure: ESOPHAGOGASTRODUODENOSCOPY (EGD);  Surgeon:               Tol[place], Boykin Nearing, MD;  Location: [facility] ENDOSCOPY;                Service: Gastroenterology;  Laterality: N/A;  C-19 test               on 01/22/2021 Requesting to be the last case due to  her               transportation No date: JOINT REPLACEMENT; Left     Comment:  knee, hip No date: JOINT REPLACEMENT; Bilateral     Comment:  knee 09/21/2015: KNEE ARTHROPLASTY; Right     Comment:  Procedure: COMPUTER ASSISTED TOTAL KNEE ARTHROPLASTY;                Surgeon: Donato Heinz, MD;  Location: [facility] ORS;                Service: Orthopedics;  Laterality: Right; No date: TONSILLECTOMY 11/17/2017: TOTAL HIP ARTHROPLASTY; Right     Comment:  Procedure: TOTAL HIP ARTHROPLASTY;  Surgeon: Donato Heinz, MD;  Location: [facility] ORS;  Service: Orthopedics;               Laterality: Right;  BMI    Body Mass Index: 46.58 kg/m      Reproductive/Obstetrics negative OB ROS                             Anesthesia Physical  Anesthesia Plan  ASA: III  Anesthesia Plan: General ETT   Post-op Pain Management: Regional block*   Induction: Intravenous  PONV Risk Score and Plan: 3 and Ondansetron, Dexamethasone and Midazolam  Airway  Management Planned: Video Laryngoscope Planned  Additional Equipment:   Intra-op Plan:   Post-operative Plan: Extubation in OR  Informed Consent: I have reviewed the patients History and Physical, chart, labs and discussed the procedure including the risks, benefits and alternatives for the proposed anesthesia with the patient or authorized representative who has indicated his/her understanding and acceptance.     Dental advisory given  Plan Discussed with: Anesthesiologist, CRNA and Surgeon  Anesthesia Plan Comments:         Anesthesia Quick Evaluation

## 2022-08-11 NOTE — Anesthesia Procedure Notes (Signed)
Anesthesia Regional Block: Interscalene brachial plexus block   Pre-Anesthetic Checklist: , timeout performed,  Correct Patient, Correct Site, Correct Laterality,  Correct Procedure, Correct Position, site marked,  Risks and benefits discussed,  Surgical consent,  Pre-op evaluation,  At surgeon's request and post-op pain management  Laterality: Upper and Left  Prep: chloraprep       Needles:  Injection technique: Single-shot  Needle Type: Stimiplex     Needle Length: 9cm  Needle Gauge: 22     Additional Needles:   Procedures:,,,, ultrasound used (permanent image in chart),,    Narrative:  Start time: 08/11/2022 6:55 AM End time: 08/11/2022 7:07 AM Injection made incrementally with aspirations every 5 mL.  Performed by: Personally  Anesthesiologist: Foye Deer, MD  Additional Notes: Patient consented for risk and benefits of nerve block including but not limited to nerve damage, failed block, bleeding and infection.  Patient voiced understanding.  Functioning IV was confirmed and monitors were applied.  Timeout done prior to procedure and prior to any sedation being given to the patient.  Patient confirmed procedure site prior to any sedation given to the patient. Sterile prep,hand hygiene and sterile gloves were used.  Minimal sedation used for procedure.  No paresthesia endorsed by patient during the procedure.  Negative aspiration and negative test dose prior to incremental administration of local anesthetic. The patient tolerated the procedure well with no immediate complications.

## 2022-08-11 NOTE — H&P (Signed)
Paper H&P to be scanned into permanent record. H&P reviewed. No significant changes noted.  

## 2022-08-11 NOTE — Op Note (Addendum)
SURGERY DATE: 08/11/2022   PRE-OP DIAGNOSIS:  1. Left subacromial impingement 2. Left acromioclavicular joint arthritis 3. Left rotator cuff tear (high-grade partial thickness articular sided tear)  POST-OP DIAGNOSIS: 1. Left subacromial impingement 2. Left acromioclavicular joint arthritis 3. Left rotator cuff tear (high-grade partial thickness articular sided tear)  PROCEDURES:  1. Left arthroscopic rotator cuff repair 2. Left subacromial decompression 3. Left extensive debridement of shoulder (glenohumeral and subacromial spaces) 4. Left arthroscopic distasl clavicle excision  SURGEON: Cato Mulligan, MD  ASSISTANT: Anitra Lauth, PA  ANESTHESIA: Gen with Exparel interscalene block  ESTIMATED BLOOD LOSS: 5cc  DRAINS:  none  TOTAL IV FLUIDS: per anesthesia   SPECIMENS: none  IMPLANTS:  - Arthrex 4.13mm SwiveLock - x4 (speed bridge kit)   OPERATIVE FINDINGS:  Examination under anesthesia: A careful examination under anesthesia was performed.  Passive range of motion was: FF: 150; ER at side: 40; ER in abduction: 85; IR in abduction: 45.  Anterior load shift: NT.  Posterior load shift: NT.  Sulcus in neutral: NT.  Sulcus in ER: NT.    Intra-operative findings: A thorough arthroscopic examination of the shoulder was performed.  The findings are: 1. Biceps tendon: Not visualized within the joint 2. Superior labrum: Significant erythema 3. Posterior labrum and capsule: normal 4. Inferior capsule and inferior recess: normal 5. Glenoid cartilage surface: Normal except mild degenerative changes along the inferior glenoid 6. Supraspinatus attachment: High-grade partial-thickness articular sided tear of the supraspinatus affecting approximately 75% of the footprint 7. Posterior rotator cuff attachment: normal 8. Humeral head articular cartilage: Small, focal areas of grade 1-2 degenerative changes 9. Rotator interval: Significant synovitis 10: Subscapularis tendon: attachment  intact 11. Anterior labrum: Mild degeneration and significant erythema 12. IGHL: normal  OPERATIVE REPORT:   Indications for procedure: Tamara Dodson is a 61 y.o. female who under went left Regeneten patch application for partial-thickness rotator cuff tear and biceps tenodesis by me on 04/11/2022.  She had been progressing well until approximately 10 weeks postoperatively until a specific physical therapy session where she felt a pop and had significantly increased pain.  She was unable to lift her arm overhead like she was able to prior to this incident.  She then underwent nonoperative management consisting of medical management, activity modification, and corticosteroid injections without improvement in her symptoms.  Clinical exam and MRI were concerning for symptomatic high-grade partial-thickness articular sided root tear cuff tear, subacromial impingement, and acromioclavicular joint arthritis. Given these findings, we decided to proceed with surgical management. After discussion of risks, benefits, and alternatives to surgery, the patient elected to proceed.   Procedure in detail: I identified Tamara Dodson in the pre-operative holding area.  I marked the operative shoulder with my initials. I reviewed the risks and benefits of the proposed surgical intervention, and the patient (and/or patient's guardian) wished to proceed.  Anesthesia was then performed with an Exparel interscalene block.  The patient was transferred to the operative suite and placed in the beach chair position.    SCDs were placed on the lower extremities. Appropriate IV antibiotics were administered within 1 hour before incision. The operative upper extremity was then prepped and draped in standard fashion. A time out was performed confirming the correct extremity, correct patient and correct procedure.   I then created a standard posterior portal with an 11 blade. The glenohumeral joint was easily entered with a blunt  trochar and the arthroscope introduced. The findings of diagnostic arthroscopy are described above. I  debrided degenerative tissue including superior and anterior labrum as well as cartilaginous surfaces of the humeral head and glenoid.  I then also debrided and coagulated the inflamed synovium to obtain hemostasis and reduce the risk of post-operative swelling using an Arthrocare radiofrequency device.  Next, I passed an 0 PDS suture through both the anterior and posterior portions of the articular sided tear to mark the borders for later reference on the bursal side.  Next, the arthroscope was then introduced into the subacromial space. A direct lateral portal was created with an 11-blade after spinal needle localization. An extensive subacromial bursectomy was performed using a combination of the shaver and Arthrocare wand. The entire acromial undersurface was exposed and the CA ligament was subperiosteally elevated to expose the anterior acromial hook. A burr was used to create a flat anterior and lateral aspect of the acromion, converting it from a Type 2 to a Type 1 acromion. Care was made to keep the deltoid fascia intact.   I then turned my attention to the arthroscopic distal clavicle excision. I identified the acromioclavicular joint. Surrounding bursal tissue was debrided and the edges of the joint were identified. I used the 5.27mm barrel burr to remove the distal clavicle parallel to the edge of the acromion. I was able to fit two widths of the burr into the space between the distal clavicle and acromion, signifying that I had removed ~64mm of distal clavicle. This was confirmed by viewing anteriorly and introducing a probe with measuring marks from the lateral portal. Hemostasis was achieved with an Arthrocare wand.   Next, I created an accessory posterolateral portal to assist with visualization and instrumentation.  The prior Regeneten patch that was placed was almost completely integrated into  the rotator cuff tissue except for the region that had been previously marked with the 0 PDS sutures that corresponded to the articular sided tear.  I debrided the poor quality edges of the Regeneten patch.  I then gently used a shaver along the bursal side of the rotator cuff in the region marked with a PDS suture.  The shaver easily fell into the glenohumeral joint suggestive and almost full-thickness tear of the supraspinatus.  After debridement, this was a U-shaped tear of the entire supraspinatus.  I prepared the footprint using a burr to expose bleeding bone.   I then percutaneously placed two medial row anchors. These were placed at the anterior and posterior borders of the tear, at the articular margin. I then shuttled the combined strand of tape from each anchor through the rotator cuff just lateral to the musculotendinous junction using a FirstPass suture passing device. I then took one tape limb from each of the 2 tapes in each anchor and fixed them 1 cm distal to the tip of the greater tuberosity in line with the anteromedial and posteromedial anchors to create a crossed, double row suture configuration. Lateral row fixation was obtained with two 4.75 mm Arthrex SwiveLock anchors with tapes placed under appropriate tension.. This allowed for excellent reduction of the rotator cuff tear with homogeneous compression of the tendon across the prepared footprint.   Fluid was evacuated from the shoulder, and the portals were closed with 3-0 Nylon. Xeroform was applied to the portals. A sterile dressing was applied, followed by a Polar Care sleeve and a SlingShot shoulder immobilizer/sling. The patient awoke from anesthesia without difficulty and was transferred to the PACU in stable condition.   Of note, assistance from a PA was essential to performing the  surgery.  PA was present for the entire surgery.  PA assisted with patient positioning, retraction, instrumentation, and wound closure. The surgery  would have been more difficult and had longer operative time without PA assistance.     COMPLICATIONS: none  DISPOSITION: plan for discharge home after recovery in PACU   POSTOPERATIVE PLAN: Remain in sling (except hygiene and elbow/wrist/hand RoM exercises as instructed by PT) x 6 weeks and NWB for this time. PT to begin 3-4 days after surgery.  Use large rotator cuff repair rehab protocol given revision shoulder surgery.  ASA x2 weeks for DVT prophylaxis.

## 2022-08-11 NOTE — Discharge Instructions (Addendum)
Post-Op Instructions - Rotator Cuff Repair  1. Bracing: You will wear a shoulder immobilizer or sling for 6 weeks.   2. Driving: No driving for 3 weeks post-op. When driving, do not wear the immobilizer. Ideally, we recommend no driving for 6 weeks while sling is in place as one arm will be immobilized.   3. Activity: No active lifting for 2 months. Wrist, hand, and elbow motion only. Avoid lifting the upper arm away from the body except for hygiene. You are permitted to bend and straighten the elbow passively only (no active elbow motion). You may use your hand and wrist for typing, writing, and managing utensils (cutting food). Do not lift more than a coffee cup for 8 weeks.  When sleeping or resting, inclined positions (recliner chair or wedge pillow) and a pillow under the forearm for support may provide better comfort for up to 4 weeks.  Avoid long distance travel for 4 weeks.  Return to normal activities after rotator cuff repair repair normally takes 6 months on average. If rehab goes very well, may be able to do most activities at 4 months, except overhead or contact sports.  4. Physical Therapy: Begins 3-4 days after surgery, and proceed 1 time per week for the first 6 weeks, then 1-2 times per week from weeks 6-20 post-op.  5. Medications:  - You will be provided a prescription for narcotic pain medicine. After surgery, take 1-2 narcotic tablets every 4 hours if needed for severe pain.  - A prescription for anti-nausea medication will be provided in case the narcotic medicine causes nausea - take 1 tablet every 6 hours only if nauseated.   - Take tylenol 1000 mg (2 Extra Strength tablets or 3 regular strength) every 8 hours for pain.  May decrease or stop tylenol 5 days after surgery if you are having minimal pain. - Take ASA 325mg/day x 2 weeks to help prevent DVTs/PEs (blood clots).  - DO NOT take ANY nonsteroidal anti-inflammatory pain medications (Advil, Motrin, Ibuprofen, Aleve,  Naproxen, or Naprosyn). These medicines can inhibit healing of your shoulder repair.    If you are taking prescription medication for anxiety, depression, insomnia, muscle spasm, chronic pain, or for attention deficit disorder, you are advised that you are at a higher risk of adverse effects with use of narcotics post-op, including narcotic addiction/dependence, depressed breathing, death. If you use non-prescribed substances: alcohol, marijuana, cocaine, heroin, methamphetamines, etc., you are at a higher risk of adverse effects with use of narcotics post-op, including narcotic addiction/dependence, depressed breathing, death. You are advised that taking > 50 morphine milligram equivalents (MME) of narcotic pain medication per day results in twice the risk of overdose or death. For your prescription provided: oxycodone 5 mg - taking more than 6 tablets per day would result in > 50 morphine milligram equivalents (MME) of narcotic pain medication. Be advised that we will prescribe narcotics short-term, for acute post-operative pain only - 3 weeks for major operations such as shoulder repair/reconstruction surgeries.     6. Post-Op Appointment:  Your first post-op appointment will be 10-14 days post-op.  7. Work or School: For most, but not all procedures, we advise staying out of work or school for at least 1 to 2 weeks in order to recover from the stress of surgery and to allow time for healing.   If you need a work or school note this can be provided.   8. Smoking: If you are a smoker, you need to refrain from   smoking in the postoperative period. The nicotine in cigarettes will inhibit healing of your shoulder repair and decrease the chance of successful repair. Similarly, nicotine containing products (gum, patches) should be avoided.   Post-operative Brace: Apply and remove the brace you received as you were instructed to at the time of fitting and as described in detail as the brace's  instructions for use indicate.  Wear the brace for the period of time prescribed by your physician.  The brace can be cleaned with soap and water and allowed to air dry only.  Should the brace result in increased pain, decreased feeling (numbness/tingling), increased swelling or an overall worsening of your medical condition, please contact your doctor immediately.  If an emergency situation occurs as a result of wearing the brace after normal business hours, please dial 911 and seek immediate medical attention.  Let your doctor know if you have any further questions about the brace issued to you. Refer to the shoulder sling instructions for use if you have any questions regarding the correct fit of your shoulder sling.  [facility] Customer Care for Troubleshooting: [phone]  Video that illustrates how to properly use a shoulder sling: "Instructions for Proper Use of an Orthopaedic Sling" https://www.youtube.com/watch?v=AHZpn_Xo45w        PERIPHERAL NERVE BLOCK PATIENT INFORMATION  Your surgeon has requested a peripheral nerve block for your surgery. This anesthetic technique provides excellent post-operative pain relief for you in a safe and effective manner. It will also help reduce the risk of nausea and vomiting and allow earlier discharge from the hospital.   The block is performed under sedation with ultrasound guidance prior to your procedure. Due to the sedation, your may or may not remember the block experience. The nerve block will begin to take effect anywhere from 5 to 30 minutes after being administered. You will be transported to the operating room from your surgery after the block is completed.   At the end of surgery, when the anesthesia wears off, you will notice a few things. Your may not be able to move or feel the part of your body targeted by the nerve block. These are normal experiences, and they will disappear as the block wears off.  If you had an interscalene nerve block  performed (which is common for shoulder surgery), your voice can be very hoarse and you may feel that you are not able to take as deep a breath as you did before surgery. Some patients may also notice a droopy eyelid on the affected side. These symptoms will resolve once the block wears off.  Pain control: The nerve block technique used is a single injection that can last anywhere from 1-3 days. The duration of the numbness can vary between individuals. After leaving the hospital, it is important that you begin to take your prescribed pain medication when you start to sense the nerve block wearing off. This will help you avoid unpleasant pain at the time the nerve block wears off, which can sometimes be in the middle of the night. The block will only cover pain in the areas targeted by the nerve block so if you experience surgical pain outside of that area, please take your prescribed pain medication. Management of the "numb area": After a nerve block, you cannot feel pain, pressure, or temperature in the affected area so there is an increased risk for injury. You should take extra care to protect the affected areas until sensation and movement returns. Please take caution to not   come in contact with extremely hot or cold items because you will not be able to sense or protect yourself form the extremes of temperature.  You may experience some persistent numbness after the procedure by most neurological deficits resolve over time and the incidence of serious long term neurological complications attributable to peripheral nerve blocks are relatively uncommon.    Information for Discharge Teaching: EXPAREL (bupivacaine liposome injectable suspension)   Your surgeon or anesthesiologist gave you EXPAREL(bupivacaine) to help control your pain after surgery.  EXPAREL is a local anesthetic that provides pain relief by numbing the tissue around the surgical site. EXPAREL is designed to release pain medication over  time and can control pain for up to 72 hours. Depending on how you respond to EXPAREL, you may require less pain medication during your recovery.  Possible side effects: Temporary loss of sensation or ability to move in the area where bupivacaine was injected. Nausea, vomiting, constipation Rarely, numbness and tingling in your mouth or lips, lightheadedness, or anxiety may occur. Call your doctor right away if you think you may be experiencing any of these sensations, or if you have other questions regarding possible side effects.  Follow all other discharge instructions given to you by your surgeon or nurse. Eat a healthy diet and drink plenty of water or other fluids.  If you return to the hospital for any reason within 96 hours following the administration of EXPAREL, it is important for health care providers to know that you have received this anesthetic. A teal colored band has been placed on your arm with the date, time and amount of EXPAREL you have received in order to alert and inform your health care providers. Please leave this armband in place for the full 96 hours following administration, and then you may remove the band.  POLAR CARE INFORMATION  [email or web address]  How to use [facility] Polar Care [place] Cold Therapy System?  YouTube   https://www.youtube.com/watch?v=E5pJtyfj4co  OPERATING INSTRUCTIONS  Start the product With dry hands, connect the transformer to the electrical connection located on the top of the cooler. Next, plug the transformer into an appropriate electrical outlet. The unit will automatically start running at this point.  To stop the pump, disconnect electrical power.  Unplug to stop the product when not in use. Unplugging the Polar Care unit turns it off. Always unplug immediately after use. Never leave it plugged in while unattended. Remove pad.    FIRST ADD WATER TO FILL LINE, THEN ICE---Replace ice when existing ice is almost melted  1 Discuss Treatment  with your Licensed Health Care Practitioner and Use Only as Prescribed 2 Apply Insulation Barrier & Cold Therapy Pad 3 Check for Moisture 4 Inspect Skin Regularly  Tips and Trouble Shooting Usage Tips 1. Use cubed or chunked ice for optimal performance. 2. It is recommended to drain the Pad between uses. To drain the pad, hold the Pad upright with the hose pointed toward the ground. Depress the black plunger and allow water to drain out. 3. You may disconnect the Pad from the unit without removing the pad from the affected area by depressing the silver tabs on the hose coupling and gently pulling the hoses apart. The Pad and unit will seal itself and will not leak. Note: Some dripping during release is normal. 4. DO NOT RUN PUMP WITHOUT WATER! The pump in this unit is designed to run with water. Running the unit without water will cause permanent damage to the pump. 5. Unplug unit   before removing lid.  TROUBLESHOOTING GUIDE Pump not running, Water not flowing to the pad, Pad is not getting cold 1. Make sure the transformer is plugged into the wall outlet. 2. Confirm that the ice and water are filled to the indicated levels. 3. Make sure there are no kinks in the pad. 4. Gently pull on the blue tube to make sure the tube/pad junction is straight. 5. Remove the pad from the treatment site and ll it while the pad is lying at; then reapply. 6. Confirm that the pad couplings are securely attached to the unit. Listen for the double clicks (Figure 1) to confirm the pad couplings are securely attached.  Leaks    Note: Some condensation on the lines, controller, and pads is unavoidable, especially in warmer climates. 1. If using a [facility] Polar Care Cold Therapy unit with a detachable Cold Therapy Pad, and a leak exists (other than condensation on the lines) disconnect the pad couplings. Make sure the silver tabs on the couplings are depressed before reconnecting the pad to the pump hose; then confirm both  sides of the coupling are properly clicked in. 2. If the coupling continues to leak or a leak is detected in the pad itself, stop using it and call [employer] Customer Care at [phone].  Cleaning After use, empty and dry the unit with a soft cloth. Warm water and mild detergent may be used occasionally to clean the pump and tubes.  WARNING: The Polar Care Cube can be cold enough to cause serious injury, including full skin necrosis. Follow these Operating Instructions, and carefully read the Product Insert (see pouch on side of unit) and the Cold Therapy Pad Fitting Instructions (provided with each Cold Therapy Pad) prior to use.        

## 2022-08-11 NOTE — Transfer of Care (Signed)
Immediate Anesthesia Transfer of Care Note  Patient: Tamara Dodson  Procedure(s) Performed: Left revision shoulder arthroscopic rotator cuff repair and extensive glenohumeral debridement (Left: Shoulder)  Patient Location: PACU  Anesthesia Type:General and Regional  Level of Consciousness: drowsy  Airway & Oxygen Therapy: Patient Spontanous Breathing and Patient connected to face mask oxygen  Post-op Assessment: Report given to RN and Post -op Vital signs reviewed and stable  Post vital signs: Reviewed and stable  Last Vitals:  Vitals Value Taken Time  BP 133/72 08/11/22 0943  Temp    Pulse 76 08/11/22 0945  Resp 14 08/11/22 0945  SpO2 98 % 08/11/22 0945  Vitals shown include unvalidated device data.  Last Pain:  Vitals:   08/11/22 0626  TempSrc: Temporal  PainSc: 10-Worst pain ever      Patients Stated Pain Goal: 4 (08/11/22 6004)  Complications: No notable events documented.

## 2022-08-11 NOTE — Addendum Note (Signed)
Addendum  created 08/11/22 1107 by Foye Deer, MD   Intraprocedure Meds edited

## 2022-08-11 NOTE — Progress Notes (Signed)
Assisted  Dr. Nelta Numbers  with left, interscalene , ultrasound guided block. Side rails up, monitors on throughout procedure. See vital signs in flow sheet. Tolerated Procedure well.

## 2022-08-11 NOTE — Anesthesia Procedure Notes (Signed)
Procedure Name: Intubation Date/Time: 08/11/2022 7:49 AM  Performed by: Esaw Grandchild, CRNAPre-anesthesia Checklist: Patient identified, Emergency Drugs available, Suction available and Patient being monitored Patient Re-evaluated:Patient Re-evaluated prior to induction Oxygen Delivery Method: Circle system utilized Preoxygenation: Pre-oxygenation with 100% oxygen Induction Type: IV induction Ventilation: Mask ventilation without difficulty and Oral airway inserted - appropriate to patient size Laryngoscope Size: McGraph and 3 Grade View: Grade I Tube type: Oral Tube size: 7.5 mm Number of attempts: 1 Airway Equipment and Method: Stylet, Oral airway, LTA kit utilized and Bite block Placement Confirmation: ETT inserted through vocal cords under direct vision, positive ETCO2 and breath sounds checked- equal and bilateral Secured at: 22 cm Tube secured with: Tape Dental Injury: Teeth and Oropharynx as per pre-operative assessment

## 2022-08-12 ENCOUNTER — Encounter: Payer: Self-pay | Admitting: Orthopedic Surgery

## 2022-09-14 ENCOUNTER — Telehealth (INDEPENDENT_AMBULATORY_CARE_PROVIDER_SITE_OTHER): Payer: Self-pay | Admitting: Vascular Surgery

## 2022-09-14 NOTE — Telephone Encounter (Signed)
In reviewing the records, we have seen the patient annually for leg swelling.  No studies are necessary. She can see GS as requested

## 2022-09-14 NOTE — Telephone Encounter (Signed)
Patient called and want to make a yearly appointment with gs.  Please advise.

## 2022-09-23 ENCOUNTER — Other Ambulatory Visit: Payer: Self-pay

## 2022-09-23 DIAGNOSIS — Z148 Genetic carrier of other disease: Secondary | ICD-10-CM

## 2022-09-26 ENCOUNTER — Inpatient Hospital Stay: Payer: BC Managed Care – PPO | Attending: Oncology

## 2022-09-26 DIAGNOSIS — R161 Splenomegaly, not elsewhere classified: Secondary | ICD-10-CM | POA: Diagnosis not present

## 2022-09-26 DIAGNOSIS — D751 Secondary polycythemia: Secondary | ICD-10-CM | POA: Diagnosis present

## 2022-09-26 DIAGNOSIS — Z148 Genetic carrier of other disease: Secondary | ICD-10-CM

## 2022-09-26 LAB — CBC WITH DIFFERENTIAL/PLATELET
Abs Immature Granulocytes: 0.04 10*3/uL (ref 0.00–0.07)
Basophils Absolute: 0.1 10*3/uL (ref 0.0–0.1)
Basophils Relative: 1 %
Eosinophils Absolute: 0.1 10*3/uL (ref 0.0–0.5)
Eosinophils Relative: 2 %
HCT: 45.7 % (ref 36.0–46.0)
Hemoglobin: 15.4 g/dL — ABNORMAL HIGH (ref 12.0–15.0)
Immature Granulocytes: 1 %
Lymphocytes Relative: 16 %
Lymphs Abs: 1.2 10*3/uL (ref 0.7–4.0)
MCH: 31.3 pg (ref 26.0–34.0)
MCHC: 33.7 g/dL (ref 30.0–36.0)
MCV: 92.9 fL (ref 80.0–100.0)
Monocytes Absolute: 0.5 10*3/uL (ref 0.1–1.0)
Monocytes Relative: 6 %
Neutro Abs: 5.8 10*3/uL (ref 1.7–7.7)
Neutrophils Relative %: 74 %
Platelets: 202 10*3/uL (ref 150–400)
RBC: 4.92 MIL/uL (ref 3.87–5.11)
RDW: 13 % (ref 11.5–15.5)
WBC: 7.7 10*3/uL (ref 4.0–10.5)
nRBC: 0 % (ref 0.0–0.2)

## 2022-09-26 LAB — HEPATIC FUNCTION PANEL
ALT: 18 U/L (ref 0–44)
AST: 35 U/L (ref 15–41)
Albumin: 4.2 g/dL (ref 3.5–5.0)
Alkaline Phosphatase: 70 U/L (ref 38–126)
Bilirubin, Direct: 0.1 mg/dL (ref 0.0–0.2)
Indirect Bilirubin: 0.8 mg/dL (ref 0.3–0.9)
Total Bilirubin: 0.9 mg/dL (ref 0.3–1.2)
Total Protein: 8.1 g/dL (ref 6.5–8.1)

## 2022-09-26 LAB — FERRITIN: Ferritin: 292 ng/mL (ref 11–307)

## 2022-09-26 LAB — IRON AND TIBC
Iron: 92 ug/dL (ref 28–170)
Saturation Ratios: 27 % (ref 10.4–31.8)
TIBC: 336 ug/dL (ref 250–450)
UIBC: 244 ug/dL

## 2022-09-28 ENCOUNTER — Inpatient Hospital Stay: Payer: BC Managed Care – PPO

## 2022-09-28 ENCOUNTER — Encounter: Payer: Self-pay | Admitting: Oncology

## 2022-09-28 ENCOUNTER — Inpatient Hospital Stay (HOSPITAL_BASED_OUTPATIENT_CLINIC_OR_DEPARTMENT_OTHER): Payer: BC Managed Care – PPO | Admitting: Oncology

## 2022-09-28 VITALS — BP 134/75 | HR 96 | Temp 96.2°F | Resp 18 | Ht 71.0 in | Wt 318.0 lb

## 2022-09-28 DIAGNOSIS — D751 Secondary polycythemia: Secondary | ICD-10-CM | POA: Diagnosis not present

## 2022-09-28 DIAGNOSIS — R768 Other specified abnormal immunological findings in serum: Secondary | ICD-10-CM | POA: Diagnosis not present

## 2022-09-28 DIAGNOSIS — K7469 Other cirrhosis of liver: Secondary | ICD-10-CM | POA: Diagnosis not present

## 2022-09-28 DIAGNOSIS — Z148 Genetic carrier of other disease: Secondary | ICD-10-CM | POA: Diagnosis not present

## 2022-09-28 DIAGNOSIS — D802 Selective deficiency of immunoglobulin A [IgA]: Secondary | ICD-10-CM | POA: Insufficient documentation

## 2022-09-28 NOTE — Assessment & Plan Note (Addendum)
Heterozygous [id number] mutation  MRI liver showed no iron overload. Labs are reviewed and discussed with patient. Elevated ferritin, normalized.  No need for phlebotomy.

## 2022-09-28 NOTE — Assessment & Plan Note (Signed)
Lab result were reviewed. Low IgG Repeat immunoglobulin level, if persistently low, will refer to immunology

## 2022-09-28 NOTE — Assessment & Plan Note (Addendum)
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 The iron stain highlights moderate staining in sinusoidal and periportal parenchymal patterns of distribution. Recommend patient to follow up with GI

## 2022-09-28 NOTE — Progress Notes (Signed)
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 Labs are reviewed and discussed with patient. Elevated ferritin, normalized.  No need for phlebotomy.   Other cirrhosis of liver (HCC) Liver biopsy showed moderate parenchymal and sinusoidal cirrhosis Scattered iron granules are identified on the HE sections. The iron stain highlights moderate staining in sinusoidal and periportal parenchymal patterns of distribution. 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 Low IgG Repeat immunoglobulin level, if persistently low, will refer to immunology  Orders Placed This Encounter  Procedures   Immunoglobulins, QN, A/E/G/M    Standing Status:   Future    Number of Occurrences:   1    Standing Expiration Date:   09/29/2023   CBC with Differential/Platelet    Standing Status:   Future    Standing Expiration Date:   09/29/2023   Ferritin    Standing Status:   Future    Standing Expiration Date:   09/29/2023   Hepatic function panel    Standing Status:   Future    Standing Expiration Date:   09/29/2023   Iron and TIBC    Standing Status:   Future    Standing Expiration Date:   09/29/2023   Follow up 1 year All questions were answered. The patient knows to call the clinic with any problems, questions or concerns.  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 female who was seen in consultation at the request of [name], MD for evaluation of hemochromatosis carrier, erythrocytosis Reviewed  patient's recent lab work  06/27/2019 Labs showed elevated hemoglobin at 16.9,  total white count 8.5, platelet counts 225,000.  Chronic Onset, duration since 2016.  She does not smoke.  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   # Heterozygous H63D mutation.  She has a history of iron deficiency after hip replacement and she took oral iron supplementation which was discontinued.  09/24/2019 liver biopsy showed moderate parenchymal and sinusoidal cirrhosis, patient heterozygous for H63D gene.  Septal fibrosis with areas suggestive of early bridging fibrosis [at least stage II fibrosis]. Focal and minimal steatosis with no evidence of steatohepatitis.  Total NAFLD score 1 of 8. --Scattered iron granules are identified on the HE sections. The iron stain highlights moderate staining in sinusoidal and periportal parenchymal patterns of distribution  Lost follow up with me after her visit in September 2020 and restablished care 03/17/2021 per recommendation of gastroenterology and the neurosurgeon Dr. Cari Caraway  Elevated Ferritin in the context of liver fibrosis- phlebotomy  02/24/2021 s/p ACDF C3-5   INTERVAL HISTORY Tamara Dodson is a 61 y.o. female who has above history reviewed by me today presents for follow up visit for management of heterozygous hemochromatosis mutation.  Liver fibrosis/Cirrhosis.  07/06/22 MRI abdomen with and without contrast  1.  Normal liver morphology. No gross evidence of hepatic iron deposition. Please see addendum for full iron quantification results. 2.  Mild splenomegaly.  0.4 mg Fe / g dry weight of liver (Normal <1.8 mg / g)  During the interval, patient has had cervical myelopathy surgeries, left shoulder surgeries. Intermittent neuropathy of feet and mouth, migraine and vertigo   Review of Systems  Constitutional:  Positive for fatigue. Negative for appetite change, chills and fever.  HENT:   Negative for hearing loss and voice change.    Eyes:  Negative for eye problems.  Respiratory:  Negative for chest tightness and cough.   Cardiovascular:  Negative for chest pain.  Gastrointestinal:  Negative for abdominal distention, abdominal pain and blood in stool.  Endocrine: Negative for hot flashes.  Genitourinary:  Negative for difficulty urinating and frequency.   Musculoskeletal:  Positive for arthralgias.  Skin:  Negative for itching and rash.  Neurological:  Negative for extremity weakness.  Hematological:  Negative for adenopathy.  Psychiatric/Behavioral:  Negative for confusion.     MEDICAL HISTORY:  Past Medical History:  Diagnosis Date   Anemia    Arthritis    osteoarthrosis   Cirrhosis ([place])    seeing Dr Tasia Catchings   Erythrocytosis    Fatty liver    Gluten intolerance    Headache    migraines   Hypertension    Iron overload 07/18/2019   Lumbago    Lymph edema    lower extremities   Morbid obesity ([place])    Neuromuscular disorder (HCC)    neuropathy - both feet   Vertigo     SURGICAL HISTORY: Past Surgical History:  Procedure Laterality Date   ANTERIOR CERVICAL DECOMP/DISCECTOMY FUSION N/A 02/24/2021   Procedure: ANTERIOR CERVICAL DECOMPRESSION/DISCECTOMY FUSION 2 LEVELS C3-5;  Surgeon: Meade Maw, MD;  Location: [facility];  Service: Neurosurgery;  Laterality: N/A;   ANTERIOR CERVICAL DECOMP/DISCECTOMY FUSION N/A 01/26/2022   Procedure: C5-6 ANTERIOR CERVICAL DISCECTOMY & FUSION (GLOBUS HEDRON);  Surgeon: Meade Maw, MD;  Location: [facility];  Service: Neurosurgery;  Laterality: N/A;   CHOLECYSTECTOMY     COLONOSCOPY WITH ESOPHAGOGASTRODUODENOSCOPY (EGD)     ESOPHAGOGASTRODUODENOSCOPY N/A 01/25/2021   Procedure: ESOPHAGOGASTRODUODENOSCOPY (EGD);  Surgeon: Toledo, [name], MD;  Location: [facility];  Service: Gastroenterology;  Laterality: N/A;  C-19 test on 01/22/2021 Requesting to be the last case due to her transportation   JOINT REPLACEMENT Left    knee, hip   JOINT REPLACEMENT  Bilateral    knee   KNEE ARTHROPLASTY Right 09/21/2015   Procedure: COMPUTER ASSISTED TOTAL KNEE ARTHROPLASTY;  Surgeon: Dereck Leep, MD;  Location: [facility];  Service: Orthopedics;  Laterality: Right;   SHOULDER ARTHROSCOPY Left 04/11/2022   Procedure: Left shoulder arthroscopic regeneten patch, biceps tenodesis, subacromial decompression;  Surgeon: Leim Fabry, MD;  Location: [facility];  Service: Orthopedics;  Laterality: Left;   SHOULDER ARTHROSCOPY WITH OPEN ROTATOR CUFF REPAIR Left 08/11/2022   Procedure: Left revision shoulder arthroscopic rotator cuff repair and extensive glenohumeral debridement;  Surgeon: Leim Fabry, MD;  Location: [place];  Service: Orthopedics;  Laterality: Left;  needs potassium draw   TONSILLECTOMY     TOTAL HIP ARTHROPLASTY Right 11/17/2017   Procedure: TOTAL HIP ARTHROPLASTY;  Surgeon: Dereck Leep, MD;  Location: [facility];  Service: Orthopedics;  Laterality: Right;   WISDOM TOOTH EXTRACTION      SOCIAL HISTORY: Social History   Socioeconomic History   Marital status: Married    Spouse name: Legrand Como    Number of children: 3   Years of education: Not on file   Highest education level: Not on file  Occupational History   Not on file  Tobacco Use   Smoking status: Never  Smokeless tobacco: Never  Vaping Use   Vaping Use: Never used  Substance and Sexual Activity   Alcohol use: No   Drug use: No   Sexual activity: Yes  Other Topics Concern   Not on file  Social History Narrative   Not on file   Social Determinants of Health   Financial Resource Strain: Low Risk  (09/24/2019)   Overall Financial Resource Strain (CARDIA)    Difficulty of Paying Living Expenses: Not very hard  Food Insecurity: No Food Insecurity (09/24/2019)   Hunger Vital Sign    Worried About Running Out of Food in the Last Year: Never true    Ran Out of Food in the Last Year: Never true  Transportation Needs: No Transportation Needs (09/24/2019)   PRAPARE -  Hydrologist (Medical): No    Lack of Transportation (Non-Medical): No  Physical Activity: Not on file  Stress: Not on file  Social Connections: Unknown (09/24/2019)   Social Connection and Isolation Panel [NHANES]    Frequency of Communication with Friends and Family: More than three times a week    Frequency of Social Gatherings with Friends and Family: Not on file    Attends Religious Services: Not on file    Active Member of Clubs or Organizations: Not on file    Attends Archivist Meetings: Not on file    Marital Status: Not on file  Intimate Partner Violence: Not At Risk (09/24/2019)   Humiliation, Afraid, Rape, and Kick questionnaire    Fear of Current or Ex-Partner: No    Emotionally Abused: No    Physically Abused: No    Sexually Abused: No    FAMILY HISTORY: Family History  Problem Relation Age of Onset   Breast cancer Mother        74's    ALLERGIES:  is allergic to gluten meal.  MEDICATIONS:  Current Outpatient Medications  Medication Sig Dispense Refill   amLODipine (NORVASC) 5 MG tablet Take 5 mg by mouth every morning.     amLODipine (NORVASC) 5 MG tablet Take 1 tablet by mouth daily.     amoxicillin-clavulanate (AUGMENTIN) 875-125 MG tablet Take 1 tablet by mouth 2 (two) times daily.     furosemide (LASIX) 20 MG tablet Take 40 mg by mouth in the morning.     meclizine (ANTIVERT) 12.5 MG tablet Take 12.5 mg by mouth 3 (three) times daily.     methocarbamol (ROBAXIN) 500 MG tablet Take 1-2 tablets (500-1,000 mg total) by mouth every 6 (six) hours as needed for muscle spasms. 60 tablet 0   mupirocin ointment (BACTROBAN) 2 % Apply 1 Application topically 3 (three) times daily.     nortriptyline (PAMELOR) 10 MG capsule Take 40 mg by mouth at bedtime.     propranolol (INDERAL) 40 MG tablet Take 40 mg by mouth 2 (two) times daily.     rizatriptan (MAXALT) 10 MG tablet Take 10 mg by mouth every 2 (two) hours as needed for migraine  (max 2 doses/24 hrs.). May repeat in 2 hours if needed     gabapentin (NEURONTIN) 300 MG capsule Take 300 mg by mouth 3 (three) times daily. (Patient not taking: Reported on 09/28/2022)     ondansetron (ZOFRAN-ODT) 4 MG disintegrating tablet Take 1 tablet (4 mg total) by mouth every 8 (eight) hours as needed for nausea or vomiting. (Patient not taking: Reported on 09/28/2022) 20 tablet 0   oxyCODONE (ROXICODONE) 5 MG immediate release tablet  Take 1-2 tablets (5-10 mg total) by mouth every 4 (four) hours as needed (pain). (Patient not taking: Reported on 09/28/2022) 30 tablet 0   polyethylene glycol powder (GLYCOLAX/MIRALAX) 17 GM/SCOOP powder Take by mouth. (Patient not taking: Reported on 09/28/2022)     Potassium 99 MG TABS Take 99 mg by mouth daily. (Patient not taking: Reported on 09/28/2022)     No current facility-administered medications for this visit.     PHYSICAL EXAMINATION: ECOG PERFORMANCE STATUS: 1 - Symptomatic but completely ambulatory Vitals:   09/28/22 1308  BP: 134/75  Pulse: 96  Resp: 18  Temp: (!) 96.2 F (35.7 C)  SpO2: 98%   Filed Weights   09/28/22 1301  Weight: (!) 318 lb (144.2 kg)    Physical Exam Constitutional:      General: She is not in acute distress.    Appearance: She is obese.  HENT:     Head: Normocephalic and atraumatic.  Eyes:     General: No scleral icterus.    Pupils: Pupils are equal, round, and reactive to light.  Cardiovascular:     Rate and Rhythm: Normal rate and regular rhythm.     Heart sounds: Normal heart sounds.  Pulmonary:     Effort: Pulmonary effort is normal. No respiratory distress.     Breath sounds: No wheezing.  Abdominal:     General: Bowel sounds are normal. There is no distension.     Palpations: Abdomen is soft.     Tenderness: There is no abdominal tenderness.  Musculoskeletal:        General: No deformity. Normal range of motion.     Cervical back: Normal range of motion and neck supple.  Skin:     General: Skin is warm and dry.  Neurological:     Mental Status: She is alert and oriented to person, place, and time.     Cranial Nerves: No cranial nerve deficit.     Coordination: Coordination normal.  Psychiatric:        Mood and Affect: Mood normal.     RADIOGRAPHIC STUDIES: I have personally reviewed the radiological images as listed and agreed with the findings in the report. MM 3D SCREEN BREAST BILATERAL  Result Date: 08/10/2022 CLINICAL DATA:  Screening. EXAM: DIGITAL SCREENING BILATERAL MAMMOGRAM WITH TOMOSYNTHESIS AND CAD TECHNIQUE: Bilateral screening digital craniocaudal and mediolateral oblique mammograms were obtained. Bilateral screening digital breast tomosynthesis was performed. The images were evaluated with computer-aided detection. COMPARISON:  Previous exam(s). ACR Breast Density Category b: There are scattered areas of fibroglandular density. FINDINGS: There are no findings suspicious for malignancy. IMPRESSION: No mammographic evidence of malignancy. A result letter of this screening mammogram will be mailed directly to the patient. RECOMMENDATION: Screening mammogram in one year. (Code:SM-B-01Y) BI-RADS CATEGORY  1: Negative. Electronically Signed   By: Marin Olp M.D.   On: 08/10/2022 10:47   MR SHOULDER LEFT WO CONTRAST  Result Date: 07/04/2022 CLINICAL DATA:  Left shoulder pain. Prior shoulder surgery (Regeneten patch, biceps tenodesis, subacromial decompression) 04/11/2022 EXAM: MRI OF THE LEFT SHOULDER WITHOUT CONTRAST TECHNIQUE: Multiplanar, multisequence MR imaging of the shoulder was performed. No intravenous contrast was administered. COMPARISON:  04/01/2022 FINDINGS: Rotator cuff: Prominent supraspinatus tendinopathy with moderate to prominent infraspinatus and mild subscapularis tendinopathy. Partial thickness articular surface tearing of the distal anterior supraspinatus tendon as on image 12 series 7. No full-thickness rotator cuff tear is observed. Muscles:   Unremarkable Biceps long head: Prior biceps tenodesis. Severe indistinctness of the tendon at  the PushLock anchor site, possibly completely torn. Further distally in the bicipital groove, the biceps tendon is expanded and irregular for example on image 13 of series 7. Acromioclavicular Joint: Interval subacromial decompression, resulting type II configuration of the acromion without substantial inferior spurring. Fluid signal intensity is present along the [facility] joint. The patient underwent subacromial subdeltoid bursectomy although there is some fluid outside of the rotator cuff and below the deltoid as shown on image 11 series 7. Glenohumeral Joint: Mild degenerative chondral thinning. Small glenohumeral joint effusion. Labrum:  Grossly intact Bones: No significant extra-articular osseous abnormalities identified. Other: No supplemental non-categorized findings. IMPRESSION: 1. Rotator cuff tendinopathy most prominently in the supraspinatus tendon. There is a small recurrent partial thickness articular surface tear along the distal anterior supraspinatus tendon shown for example on image 12 series 7. 2. Prior biceps tenodesis. Severe indistinctness of the long head biceps tendon at the anchor site, concerning for possible complete tear. Further distally in the bicipital groove, the long head tendon is expanded and irregular with abnormal increased signal. 3. Small amount of fluid between the rotator cuff and subdeltoid region although no full-thickness rotator cuff tear is identified. This would typically correspond to the subacromial subdeltoid bursa although by report the patient had prior bursectomy. 4. Small glenohumeral joint effusion. 5. Mild degenerative chondral thinning in the glenohumeral joint. Electronically Signed   By: Van Clines M.D.   On: 07/04/2022 10:19     LABORATORY DATA:  I have reviewed the data as listed    Latest Ref Rng & Units 09/26/2022    1:14 PM 04/11/2022   11:43 AM  01/12/2022   11:52 AM  CBC  WBC 4.0 - 10.5 K/uL 7.7   8.4   Hemoglobin 12.0 - 15.0 g/dL 15.4  16.3  13.7   Hematocrit 36.0 - 46.0 % 45.7  48.0  41.4   Platelets 150 - 400 K/uL 202   228       Latest Ref Rng & Units 09/26/2022    1:14 PM 04/11/2022   11:43 AM 01/12/2022   11:52 AM  CMP  Glucose 70 - 99 mg/dL  113  133   BUN 6 - 20 mg/dL  9  7   Creatinine 0.44 - 1.00 mg/dL  0.50  0.58   Sodium 135 - 145 mmol/L  139  135   Potassium 3.5 - 5.1 mmol/L  4.3  4.0   Chloride 98 - 111 mmol/L  103  101   CO2 22 - 32 mmol/L   24   Calcium 8.9 - 10.3 mg/dL   9.2   Total Protein 6.5 - 8.1 g/dL 8.1     Total Bilirubin 0.3 - 1.2 mg/dL 0.9     Alkaline Phos 38 - 126 U/L 70     AST 15 - 41 U/L 35     ALT 0 - 44 U/L 18      Iron/TIBC/Ferritin/ %Sat    Component Value Date/Time   IRON 92 09/26/2022 1314   TIBC 336 09/26/2022 1314   FERRITIN 292 09/26/2022 1314   IRONPCTSAT 27 09/26/2022 1314

## 2022-09-28 NOTE — Progress Notes (Signed)
Patient reports neuropathy in feet and some in mouth that comes and goes. She also has migraines and vertigo.

## 2022-09-30 LAB — IMMUNOGLOBULINS A/E/G/M, SERUM
IgA: <5 mg/dL — ABNORMAL LOW (ref 87–352)
IgE (Immunoglobulin E), Serum: 3 IU/mL — ABNORMAL LOW (ref 6–495)
IgG (Immunoglobin G), Serum: 1414 mg/dL (ref 586–1602)
IgM (Immunoglobulin M), Srm: 161 mg/dL (ref 26–217)

## 2022-10-03 ENCOUNTER — Telehealth: Payer: Self-pay

## 2022-10-03 ENCOUNTER — Other Ambulatory Visit: Payer: Self-pay

## 2022-10-03 DIAGNOSIS — R768 Other specified abnormal immunological findings in serum: Secondary | ICD-10-CM

## 2022-10-03 NOTE — Telephone Encounter (Signed)
-----   Message from Evelina Dun, RN sent at 10/03/2022  9:40 AM EDT -----  ----- Message ----- From: Earlie Server, MD Sent: 10/02/2022   2:46 PM EDT To: Evelina Dun, RN  Let patient know that her IgA is persistently low. Recommend immunology work up . Please refer her.  Her 1 year appt is scheduled with other provider?

## 2022-10-03 NOTE — Telephone Encounter (Signed)
Explained to patient  her IgA is persistently low. We have referred her to immunology and they will contact her with apts. No questions voiced.

## 2022-10-04 NOTE — Telephone Encounter (Signed)
Referral to immunology faxed to [facility].   Fax: [phone] Phone: [phone]

## 2022-10-11 NOTE — Progress Notes (Unsigned)
MRN : [id number]  Tamara Dodson is a 61 y.o. (02/12/1961) female who presents with chief complaint of legs swell.  History of Present Illness:   The patient returns to the office for followup evaluation regarding leg swelling.  The swelling has persisted but with the lymph pump is much, much better controlled. The pain associated with swelling is essentially eliminated. There have not been any interval development of a ulcerations or wounds.   The patient denies problems with the pump, noting it is working well and the leggings are in good condition.   Since the previous visit the patient has been wearing graduated compression stockings and using the lymph pump on a routine basis and  has noted significant improvement in the lymphedema.    Patient stated the lymph pump has been a very positive factor in her care.    No outpatient medications have been marked as taking for the 10/13/22 encounter (Appointment) with Delana Meyer, Dolores Lory, MD.    Past Medical History:  Diagnosis Date   Anemia    Arthritis    osteoarthrosis   Cirrhosis ([place])    seeing Dr Tasia Catchings   Erythrocytosis    Fatty liver    Gluten intolerance    Headache    migraines   Hypertension    Iron overload 07/18/2019   Lumbago    Lymph edema    lower extremities   Morbid obesity ([place])    Neuromuscular disorder (HCC)    neuropathy - both feet   Vertigo     Past Surgical History:  Procedure Laterality Date   ANTERIOR CERVICAL DECOMP/DISCECTOMY FUSION N/A 02/24/2021   Procedure: ANTERIOR CERVICAL DECOMPRESSION/DISCECTOMY FUSION 2 LEVELS C3-5;  Surgeon: Meade Maw, MD;  Location: [facility];  Service: Neurosurgery;  Laterality: N/A;   ANTERIOR CERVICAL DECOMP/DISCECTOMY FUSION N/A 01/26/2022   Procedure: C5-6 ANTERIOR CERVICAL DISCECTOMY & FUSION (GLOBUS HEDRON);  Surgeon: Meade Maw, MD;  Location: [facility];  Service: Neurosurgery;  Laterality: N/A;   CHOLECYSTECTOMY     COLONOSCOPY WITH  ESOPHAGOGASTRODUODENOSCOPY (EGD)     ESOPHAGOGASTRODUODENOSCOPY N/A 01/25/2021   Procedure: ESOPHAGOGASTRODUODENOSCOPY (EGD);  Surgeon: Toledo, [name], MD;  Location: [facility];  Service: Gastroenterology;  Laterality: N/A;  C-19 test on 01/22/2021 Requesting to be the last case due to her transportation   JOINT REPLACEMENT Left    knee, hip   JOINT REPLACEMENT Bilateral    knee   KNEE ARTHROPLASTY Right 09/21/2015   Procedure: COMPUTER ASSISTED TOTAL KNEE ARTHROPLASTY;  Surgeon: Dereck Leep, MD;  Location: [facility];  Service: Orthopedics;  Laterality: Right;   SHOULDER ARTHROSCOPY Left 04/11/2022   Procedure: Left shoulder arthroscopic regeneten patch, biceps tenodesis, subacromial decompression;  Surgeon: Leim Fabry, MD;  Location: [facility];  Service: Orthopedics;  Laterality: Left;   SHOULDER ARTHROSCOPY WITH OPEN ROTATOR CUFF REPAIR Left 08/11/2022   Procedure: Left revision shoulder arthroscopic rotator cuff repair and extensive glenohumeral debridement;  Surgeon: Leim Fabry, MD;  Location: [place];  Service: Orthopedics;  Laterality: Left;  needs potassium draw   TONSILLECTOMY     TOTAL HIP ARTHROPLASTY Right 11/17/2017   Procedure: TOTAL HIP ARTHROPLASTY;  Surgeon: Dereck Leep, MD;  Location: [facility];  Service: Orthopedics;  Laterality: Right;   WISDOM TOOTH EXTRACTION      Social History Social History   Tobacco Use   Smoking status: Never   Smokeless tobacco: Never  Vaping Use   Vaping Use: Never used  Substance Use Topics  Alcohol use: No   Drug use: No    Family History Family History  Problem Relation Age of Onset   Breast cancer Mother        13's    Allergies  Allergen Reactions   Gluten Meal Diarrhea    bloating     REVIEW OF SYSTEMS (Negative unless checked)  Constitutional: [] Weight loss  [] Fever  [] Chills Cardiac: [] Chest pain   [] Chest pressure   [] Palpitations   [] Shortness of breath when laying flat   [] Shortness of  breath with exertion. Vascular:  [] Pain in legs with walking   [x] Pain in legs with standing  [] History of DVT   [] Phlebitis   [x] Swelling in legs   [] Varicose veins   [] Non-healing ulcers Pulmonary:   [] Uses home oxygen   [] Productive cough   [] Hemoptysis   [] Wheeze  [] COPD   [] Asthma Neurologic:  [] Dizziness   [] Seizures   [] History of stroke   [] History of TIA  [] Aphasia   [] Vissual changes   [] Weakness or numbness in arm   [] Weakness or numbness in leg Musculoskeletal:   [] Joint swelling   [x] Joint pain   [] Low back pain Hematologic:  [] Easy bruising  [] Easy bleeding   [] Hypercoagulable state   [] Anemic Gastrointestinal:  [] Diarrhea   [] Vomiting  [x] Gastroesophageal reflux/heartburn   [] Difficulty swallowing. Genitourinary:  [] Chronic kidney disease   [] Difficult urination  [] Frequent urination   [] Blood in urine Skin:  [] Rashes   [] Ulcers  Psychological:  [] History of anxiety   []  History of major depression.  Physical Examination  There were no vitals filed for this visit. There is no height or weight on file to calculate BMI. Gen: WD/WN, NAD Head: [place]/AT, No temporalis wasting.  Ear/Nose/Throat: Hearing grossly intact, nares w/o erythema or drainage, pinna without lesions Eyes: PER, EOMI, sclera nonicteric.  Neck: Supple, no gross masses.  No JVD.  Pulmonary:  Good air movement, no audible wheezing, no use of accessory muscles.  Cardiac: RRR, precordium not hyperdynamic. Vascular:  scattered varicosities present bilaterally.  Mild venous stasis changes to the legs bilaterally.  3-4+ soft pitting edema, CEAP C4sEpAsPr  Vessel Right Left  Radial Palpable Palpable  Gastrointestinal: soft, non-distended. No guarding/no peritoneal signs.  Musculoskeletal: M/S 5/5 throughout.  No deformity.  Neurologic: CN 2-12 intact. Pain and light touch intact in extremities.  Symmetrical.  Speech is fluent. Motor exam as listed above. Psychiatric: Judgment intact, Mood & affect appropriate for pt's  clinical situation. Dermatologic: Venous rashes no ulcers noted.  No changes consistent with cellulitis. Lymph : No lichenification or skin changes of chronic lymphedema.  CBC Lab Results  Component Value Date   WBC 7.7 09/26/2022   HGB 15.4 (H) 09/26/2022   HCT 45.7 09/26/2022   MCV 92.9 09/26/2022   PLT 202 09/26/2022    BMET    Component Value Date/Time   NA 139 04/11/2022 1143   NA 137 02/20/2013 0723   K 4.3 04/11/2022 1143   K 3.7 02/20/2013 0723   CL 103 04/11/2022 1143   CL 104 02/20/2013 0723   CO2 24 01/12/2022 1152   CO2 26 02/20/2013 0723   GLUCOSE 113 (H) 04/11/2022 1143   GLUCOSE 126 (H) 02/20/2013 0723   BUN 9 04/11/2022 1143   BUN 6 (L) 02/20/2013 0723   CREATININE 0.50 04/11/2022 1143   CREATININE 0.54 (L) 02/20/2013 0723   CALCIUM 9.2 01/12/2022 1152   CALCIUM 8.7 02/20/2013 0723   GFRNONAA >60 01/12/2022 1152   GFRNONAA >60 02/20/2013  GFRAA >60 12/11/2017 1121   GFRAA >60 02/20/2013 0723   CrCl cannot be calculated (Patient's most recent lab result is older than the maximum 21 days allowed.).  COAG Lab Results  Component Value Date   INR 1.1 01/12/2022   INR 1.0 02/12/2021   INR 1.0 09/23/2019    Radiology No results found.   Assessment/Plan There are no diagnoses linked to this encounter.   Levora Dredge, MD  10/11/2022 5:56 PM

## 2022-10-13 ENCOUNTER — Ambulatory Visit (INDEPENDENT_AMBULATORY_CARE_PROVIDER_SITE_OTHER): Payer: BC Managed Care – PPO | Admitting: Vascular Surgery

## 2022-10-13 ENCOUNTER — Encounter (INDEPENDENT_AMBULATORY_CARE_PROVIDER_SITE_OTHER): Payer: Self-pay | Admitting: Vascular Surgery

## 2022-10-13 VITALS — BP 161/94 | HR 91 | Resp 16 | Wt 321.6 lb

## 2022-10-13 DIAGNOSIS — M159 Polyosteoarthritis, unspecified: Secondary | ICD-10-CM | POA: Diagnosis not present

## 2022-10-13 DIAGNOSIS — I10 Essential (primary) hypertension: Secondary | ICD-10-CM

## 2022-10-13 DIAGNOSIS — I890 Lymphedema, not elsewhere classified: Secondary | ICD-10-CM

## 2022-10-13 DIAGNOSIS — K219 Gastro-esophageal reflux disease without esophagitis: Secondary | ICD-10-CM | POA: Diagnosis not present

## 2022-10-13 DIAGNOSIS — M150 Primary generalized (osteo)arthritis: Secondary | ICD-10-CM

## 2022-10-17 ENCOUNTER — Encounter (INDEPENDENT_AMBULATORY_CARE_PROVIDER_SITE_OTHER): Payer: Self-pay

## 2022-10-19 ENCOUNTER — Other Ambulatory Visit: Payer: Self-pay

## 2022-10-19 DIAGNOSIS — Z981 Arthrodesis status: Secondary | ICD-10-CM

## 2022-10-20 ENCOUNTER — Encounter: Payer: Self-pay | Admitting: Neurosurgery

## 2022-10-20 ENCOUNTER — Ambulatory Visit
Admission: RE | Admit: 2022-10-20 | Discharge: 2022-10-20 | Disposition: A | Discharge status: Home / Self Care | Payer: BC Managed Care – PPO | Attending: Neurosurgery | Admitting: Neurosurgery

## 2022-10-20 ENCOUNTER — Ambulatory Visit
Admission: RE | Admit: 2022-10-20 | Discharge: 2022-10-20 | Disposition: A | Discharge status: Home / Self Care | Payer: BC Managed Care – PPO | Source: Ambulatory Visit | Attending: Neurosurgery | Admitting: Neurosurgery

## 2022-10-20 ENCOUNTER — Ambulatory Visit (INDEPENDENT_AMBULATORY_CARE_PROVIDER_SITE_OTHER): Payer: BC Managed Care – PPO | Admitting: Neurosurgery

## 2022-10-20 VITALS — BP 136/84 | Ht 71.0 in | Wt 316.2 lb

## 2022-10-20 DIAGNOSIS — Z981 Arthrodesis status: Secondary | ICD-10-CM

## 2022-10-20 DIAGNOSIS — G959 Disease of spinal cord, unspecified: Secondary | ICD-10-CM | POA: Diagnosis not present

## 2022-10-20 NOTE — Progress Notes (Signed)
   DOS: 01/26/22 (C5-6 ACDF)  HISTORY OF PRESENT ILLNESS: 10/20/2022 Ms. Tamara Dodson is status post ACDF.  Unfortunate, Tamara has had 2 rotator cuff repairs this year.  Tamara still having some pain from that.  Overall, Tamara is doing well.  Tamara does have some heaviness in her neck and head at the end of the day.  Tamara is taking Tylenol for that.  PHYSICAL EXAMINATION:   Vitals:   10/20/22 0916  BP: 136/84   General: Patient is well developed, well nourished, calm, collected, and in no apparent distress.  NEUROLOGICAL:  General: In no acute distress.  Awake, alert, oriented to person, place, and time. Pupils equal round and reactive to light.   Strength: Side Biceps Triceps Deltoid Interossei Grip Wrist Ext. Wrist Flex.  R 5 5 5 5 5 5 5   L 5 5 5 5 5 5 5    Incision c/d/i   ROS (Neurologic): Negative except as noted above  IMAGING: No significant complication.  There is some settling at C5-6.  ASSESSMENT/PLAN:  Tamara Dodson is doing well after ACDF C5-6.  Tamara previously had C3-5 anterior cervical discectomy and fusion.  Tamara is having some neck discomfort.  I think Tamara could start using NSAIDs to help with this.  I recommended naproxen over-the-counter 2 pills twice daily as needed.  I will see her back on an as-needed basis.  I spent a total of 10 minutes in face-to-face and non-face-to-face activities related to this patient's care today.   Meade Maw MD, [facility] Department of Neurosurgery

## 2022-11-18 ENCOUNTER — Other Ambulatory Visit: Payer: Self-pay

## 2022-11-18 ENCOUNTER — Encounter: Payer: Self-pay | Admitting: Internal Medicine

## 2022-11-18 ENCOUNTER — Ambulatory Visit (INDEPENDENT_AMBULATORY_CARE_PROVIDER_SITE_OTHER): Payer: BC Managed Care – PPO | Admitting: Internal Medicine

## 2022-11-18 VITALS — BP 134/82 | HR 97 | Temp 97.6°F | Resp 19 | Ht 69.5 in | Wt 317.1 lb

## 2022-11-18 DIAGNOSIS — J310 Chronic rhinitis: Secondary | ICD-10-CM | POA: Diagnosis not present

## 2022-11-18 DIAGNOSIS — R0609 Other forms of dyspnea: Secondary | ICD-10-CM | POA: Diagnosis not present

## 2022-11-18 DIAGNOSIS — B999 Unspecified infectious disease: Secondary | ICD-10-CM | POA: Diagnosis not present

## 2022-11-18 DIAGNOSIS — J329 Chronic sinusitis, unspecified: Secondary | ICD-10-CM

## 2022-11-18 DIAGNOSIS — R062 Wheezing: Secondary | ICD-10-CM

## 2022-11-18 NOTE — Progress Notes (Signed)
NEW PATIENT  Date of Service/Encounter:  11/18/22  Consult requested by: [name], MD   Subjective:   Tamara Dodson (DOB: 1961-05-04) is a 61 y.o. female who presents to the clinic on 11/18/2022 with a chief complaint of Establish Care and abnormal labs .    History obtained from: chart review and patient.   Recurrent Infections Reports having trouble with recurrent infections, mostly respiratory and GI.  In the last few years, she has had bronchitis x3 and had wheezing/shortness of breath/chest tightness with this.  Required antibiotics, steroids, inhaler.  No history of childhood asthma. No need for inhalers outside of this.   She also has frequent upper respiratory infections/sinus infections that last a long time. She also had a GI infection-diarrhea/pain recently last month that lasted about 3 days while other family members only had 1 day of symptoms.  Has received about 5-6 courses of antibiotics this year.  No sepsis or deep seated infections or abscesses.  She is uptodate on vaccination.  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 Cathie Hoops checked labs recently, normal IgG and IgM.  She follows him for the hemochromatosis.   Rhinitis She does have runny nose, congestion intermittently but used to be worse when she was younger.  She does have frequent sinus infections requiring antibiotics.  She uses Zyrtec PRN. She does not use any nose sprays.  No GERD  Past Medical History: Past Medical History:  Diagnosis Date   Anemia    Arthritis    osteoarthrosis   Cirrhosis (HCC)    seeing Dr Cathie Hoops   Erythrocytosis    Fatty liver    Gluten intolerance    Headache    migraines   Hypertension    Iron overload 07/18/2019   Lumbago    Lymph edema    lower extremities   Morbid obesity (HCC)    Neuromuscular disorder (HCC)    neuropathy - both feet   Vertigo     Past Surgical History: Past Surgical History:  Procedure Laterality  Date   ANTERIOR CERVICAL DECOMP/DISCECTOMY FUSION N/A 02/24/2021   Procedure: ANTERIOR CERVICAL DECOMPRESSION/DISCECTOMY FUSION 2 LEVELS C3-5;  Surgeon: Venetia Night, MD;  Location: [facility];  Service: Neurosurgery;  Laterality: N/A;   ANTERIOR CERVICAL DECOMP/DISCECTOMY FUSION N/A 01/26/2022   Procedure: C5-6 ANTERIOR CERVICAL DISCECTOMY & FUSION (GLOBUS HEDRON);  Surgeon: Venetia Night, MD;  Location: [facility];  Service: Neurosurgery;  Laterality: N/A;   CHOLECYSTECTOMY     COLONOSCOPY WITH ESOPHAGOGASTRODUODENOSCOPY (EGD)     ESOPHAGOGASTRODUODENOSCOPY N/A 01/25/2021   Procedure: ESOPHAGOGASTRODUODENOSCOPY (EGD);  Surgeon: Toledo, Boykin Nearing, MD;  Location: [facility];  Service: Gastroenterology;  Laterality: N/A;  C-19 test on 01/22/2021 Requesting to be the last case due to her transportation   JOINT REPLACEMENT Left    knee, hip   JOINT REPLACEMENT Bilateral    knee   KNEE ARTHROPLASTY Right 09/21/2015   Procedure: COMPUTER ASSISTED TOTAL KNEE ARTHROPLASTY;  Surgeon: Donato Heinz, MD;  Location: [facility];  Service: Orthopedics;  Laterality: Right;   SHOULDER ARTHROSCOPY Left 04/11/2022   Procedure: Left shoulder arthroscopic regeneten patch, biceps tenodesis, subacromial decompression;  Surgeon: Signa Kell, MD;  Location: [facility];  Service: Orthopedics;  Laterality: Left;   SHOULDER ARTHROSCOPY WITH OPEN ROTATOR CUFF REPAIR Left 08/11/2022   Procedure: Left revision shoulder arthroscopic rotator cuff repair and extensive glenohumeral debridement;  Surgeon: Signa Kell, MD;  Location[facility];  Service: Orthopedics;  Laterality: Left;  needs potassium draw   TONSILLECTOMY     TOTAL HIP ARTHROPLASTY Right 11/17/2017   Procedure: TOTAL HIP ARTHROPLASTY;  Surgeon: Donato HeinzHooten, James P, MD;  Location: [facility];  Service: Orthopedics;  Laterality: Right;   WISDOM TOOTH EXTRACTION      Family History: Family History  Problem Relation Age of Onset   Breast cancer  Mother        5540's    Social History:  Lives in a 18 year house Flooring in bedroom: carpet Pets: dog Tobacco use/exposure: none Job: none  Medication List:  Allergies as of 11/18/2022       Reactions   Gluten Meal Diarrhea   bloating        Medication List        Accurate as of November 18, 2022  1:12 PM. If you have any questions, ask your nurse or doctor.          amLODipine 5 MG tablet Commonly known as: NORVASC Take 1 tablet by mouth daily.   furosemide 20 MG tablet Commonly known as: LASIX Take 40 mg by mouth in the morning.   gabapentin 300 MG capsule Commonly known as: NEURONTIN Take 300 mg by mouth 3 (three) times daily.   meclizine 12.5 MG tablet Commonly known as: ANTIVERT Take 12.5 mg by mouth 3 (three) times daily.   methocarbamol 500 MG tablet Commonly known as: Robaxin Take 1-2 tablets (500-1,000 mg total) by mouth every 6 (six) hours as needed for muscle spasms.   naproxen 500 MG tablet Commonly known as: NAPROSYN Take by mouth.   nortriptyline 10 MG capsule Commonly known as: PAMELOR Take 40 mg by mouth at bedtime.   polyethylene glycol powder 17 GM/SCOOP powder Commonly known as: GLYCOLAX/MIRALAX Take by mouth.   propranolol 40 MG tablet Commonly known as: INDERAL Take 40 mg by mouth 2 (two) times daily.   rizatriptan 10 MG tablet Commonly known as: MAXALT Take 10 mg by mouth every 2 (two) hours as needed for migraine (max 2 doses/24 hrs.). May repeat in 2 hours if needed         REVIEW OF SYSTEMS: Pertinent positives and negatives discussed in HPI.   Objective:   Physical Exam: BP 134/82 (BP Location: Left Arm, Patient Position: Sitting, Cuff Size: Large)   Pulse 97   Temp 97.6 F (36.4 C) (Temporal)   Resp 19   Ht 5' 9.5" (1.765 m)   Wt (!) 317 lb 1.6 oz (143.8 kg)   SpO2 98%   BMI 46.16 kg/m  Body mass index is 46.16 kg/m. GEN: alert, well developed HEENT: clear conjunctiva, TM grey and translucent, nose  with + inferior turbinate hypertrophy, pink nasal mucosa, slight clear rhinorrhea, no cobblestoning HEART: regular rate and rhythm, no murmur LUNGS: clear to auscultation bilaterally, no coughing, unlabored respiration ABDOMEN: soft, non distended  SKIN: no rashes or lesions  Reviewed:  Dr Cathie HoopsYu results and note in HPI  Spirometry:  Tracings reviewed. Her effort: Variable effort-results affected. FVC: 2.78L FEV1: 2.34L, 78% predicted FEV1/FVC ratio: 84% Interpretation: Spirometry consistent with possible restrictive disease. No obstruction noted. Low FVC likely due to technique.   Please see scanned spirometry results for details.  Skin Testing:  Skin prick testing was placed, which includes aeroallergens/foods, histamine control, and saline control.  Verbal consent was obtained prior to placing test.  Patient tolerated procedure well.  Allergy testing results were read and interpreted by myself, documented by clinical staff. Adequate positive and negative  control.  Results discussed with patient/family.  Airborne Adult Perc - 11/18/22 1027     Time Antigen Placed 1027    Allergen Manufacturer Waynette Buttery    Location Back    Number of Test 59    Panel 1 Select    1. Control-Buffer 50% Glycerol Negative    2. Control-Histamine 1 mg/ml 3+    3. Albumin saline Negative    4. Bahia Negative    5. French Southern Territories Negative    6. Johnson Negative    7. Kentucky Blue Negative    8. Meadow Fescue Negative    9. Perennial Rye Negative    10. Sweet Vernal Negative    11. Timothy Negative    12. Cocklebur Negative    13. Burweed Marshelder Negative    14. Ragweed, short Negative    15. Ragweed, Giant Negative    16. Plantain,  English Negative    17. Lamb's Quarters Negative    18. Sheep Sorrell Negative    19. Rough Pigweed Negative    20. Marsh Elder, Rough Negative    21. Mugwort, Common Negative    22. Ash mix Negative    23. Birch mix Negative    24. Beech American Negative    25. Box,  Elder Negative    26. Cedar, red Negative    27. Cottonwood, Guinea-Bissau Negative    28. Elm mix Negative    29. Hickory Negative    30. Maple mix Negative    31. Oak, Guinea-Bissau mix Negative    32. Pecan Pollen Negative    33. Pine mix Negative    34. Sycamore Eastern Negative    35. Walnut, Black Pollen Negative    36. Alternaria alternata Negative    37. Cladosporium Herbarum Negative    38. Aspergillus mix Negative    39. Penicillium mix Negative    40. Bipolaris sorokiniana (Helminthosporium) Negative    41. Drechslera spicifera (Curvularia) Negative    42. Mucor plumbeus Negative    43. Fusarium moniliforme Negative    44. Aureobasidium pullulans (pullulara) Negative    45. Rhizopus oryzae Negative    46. Botrytis cinera Negative    47. Epicoccum nigrum Negative    48. Phoma betae Negative    49. Candida Albicans Negative    50. Trichophyton mentagrophytes Negative    51. Mite, D Farinae  5,000 AU/ml Negative    52. Mite, D Pteronyssinus  5,000 AU/ml Negative    53. Cat Hair 10,000 BAU/ml Negative    54.  Dog Epithelia Negative    55. Mixed Feathers Negative    56. Horse Epithelia Negative    57. Cockroach, German Negative    58. Mouse Negative    59. Tobacco Leaf Negative             Intradermal - 11/18/22 1217     Time Antigen Placed 1150    Allergen Manufacturer Waynette Buttery    Location Arm    Number of Test 15    Intradermal Select    Control Negative    French Southern Territories Negative    Johnson Negative    7 Grass Negative    Ragweed mix Negative    Weed mix Negative    Tree mix Negative    Mold 1 Negative    Mold 2 Negative    Mold 3 Negative    Mold 4 Negative    Cat Negative    Dog Negative    Cockroach Negative  Mite mix Negative               Assessment:   1. Recurrent infections   2. Chronic rhinitis   3. Recurrent sinusitis   4. Wheezing   5. 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  Will perform labwork to further evaluate your immune system with titers to protein/carbohydrate.  Will consider subclasses.  - If you ever require a transfusion, please let them know you are IgA deficient.  - Please keep a diary of your infections.  Write down the symptoms, diagnosis, medications given, how long did it last.   - No obstruction on spirometry today, low suspicion for asthma contributing to her frequent bronchitis episodes.    Chronic Rhinitis Recurrent Sinusitis  - Positive skin test 11/2022: none - Use nasal saline rinses before nose sprays such as with Neilmed Sinus Rinse.  Use distilled water.   - Use Azelastine 1-2 sprays each nostril twice daily as needed. Aim upward and outward. - Use Zyrtec 10 mg daily as needed for runny nose or itchy watery eyes .    Return in about 3 months (around 02/17/2023).  Alesia Morin, MD [facility]

## 2022-11-18 NOTE — Patient Instructions (Addendum)
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  Will perform labwork to further evaluate your immune system. - If you ever require a transfusion, please let them know you are IgA deficient.  - Please keep a diary of your infections.  Write down the symptoms, diagnosis, medications given, how long did it last.   - Normal spirometry today, low suspicion for asthma.   Rhinitis: - Positive skin test 11/2022: none - Use nasal saline rinses before nose sprays such as with Neilmed Sinus Rinse.  Use distilled water.   - Use Azelastine 1-2 sprays each nostril twice daily as needed. Aim upward and outward. - Use Zyrtec 10 mg daily as needed for runny nose or itchy watery eyes .

## 2022-11-24 LAB — STREP PNEUMONIAE 23 SEROTYPES IGG
Pneumo Ab Type 1*: 2.4 ug/mL (ref >1.3)
Pneumo Ab Type 12 (12F)*: 1.8 ug/mL (ref >1.3)
Pneumo Ab Type 14*: 4 ug/mL (ref >1.3)
Pneumo Ab Type 17 (17F)*: 8.9 ug/mL (ref >1.3)
Pneumo Ab Type 19 (19F)*: 10.6 ug/mL (ref >1.3)
Pneumo Ab Type 2*: 8.1 ug/mL (ref >1.3)
Pneumo Ab Type 20*: 7.3 ug/mL (ref >1.3)
Pneumo Ab Type 22 (22F)*: 6.6 ug/mL (ref >1.3)
Pneumo Ab Type 23 (23F)*: 0.5 ug/mL — ABNORMAL LOW (ref >1.3)
Pneumo Ab Type 26 (6B)*: 1.8 ug/mL (ref >1.3)
Pneumo Ab Type 3*: 0.7 ug/mL — ABNORMAL LOW (ref >1.3)
Pneumo Ab Type 34 (10A)*: 1.8 ug/mL (ref >1.3)
Pneumo Ab Type 4*: 0.2 ug/mL — ABNORMAL LOW (ref >1.3)
Pneumo Ab Type 43 (11A)*: 0.7 ug/mL — ABNORMAL LOW (ref >1.3)
Pneumo Ab Type 5*: 0.7 ug/mL — ABNORMAL LOW (ref >1.3)
Pneumo Ab Type 51 (7F)*: 0.4 ug/mL — ABNORMAL LOW (ref >1.3)
Pneumo Ab Type 54 (15B)*: 4.3 ug/mL (ref >1.3)
Pneumo Ab Type 56 (18C)*: >8.1 ug/mL (ref >1.3)
Pneumo Ab Type 57 (19A)*: >33.6 ug/mL (ref >1.3)
Pneumo Ab Type 68 (9V)*: 0.4 ug/mL — ABNORMAL LOW (ref >1.3)
Pneumo Ab Type 70 (33F)*: 2 ug/mL (ref >1.3)
Pneumo Ab Type 8*: 3.6 ug/mL (ref >1.3)
Pneumo Ab Type 9 (9N)*: 4.3 ug/mL (ref >1.3)

## 2022-11-24 LAB — IGG, IGA, IGM
IgA/Immunoglobulin A, Serum: <5 mg/dL — ABNORMAL LOW (ref 87–352)
IgG (Immunoglobin G), Serum: 1672 mg/dL — ABNORMAL HIGH (ref 586–1602)
IgM (Immunoglobulin M), Srm: 164 mg/dL (ref 26–217)

## 2022-11-24 LAB — DIPHTHERIA / TETANUS ANTIBODY PANEL
Diphtheria Ab: 0.85 IU/mL (ref <0.10)
Tetanus Ab, IgG: 2.75 IU/mL (ref <0.10)

## 2022-12-19 DIAGNOSIS — T884XXA Failed or difficult intubation, initial encounter: Secondary | ICD-10-CM

## 2022-12-19 HISTORY — DX: Failed or difficult intubation, initial encounter: T88.4XXA

## 2023-01-03 ENCOUNTER — Encounter: Payer: Self-pay | Admitting: Oncology

## 2023-02-17 ENCOUNTER — Other Ambulatory Visit: Payer: Self-pay

## 2023-02-17 ENCOUNTER — Encounter: Payer: Self-pay | Admitting: Internal Medicine

## 2023-02-17 ENCOUNTER — Ambulatory Visit (INDEPENDENT_AMBULATORY_CARE_PROVIDER_SITE_OTHER): Payer: BC Managed Care – PPO | Admitting: Internal Medicine

## 2023-02-17 VITALS — BP 142/84 | HR 103 | Temp 97.2°F | Resp 18 | Ht 69.5 in | Wt 309.2 lb

## 2023-02-17 DIAGNOSIS — D802 Selective deficiency of immunoglobulin A [IgA]: Secondary | ICD-10-CM

## 2023-02-17 DIAGNOSIS — J310 Chronic rhinitis: Secondary | ICD-10-CM | POA: Diagnosis not present

## 2023-02-17 DIAGNOSIS — B999 Unspecified infectious disease: Secondary | ICD-10-CM

## 2023-02-17 MED ORDER — AZELASTINE HCL 0.1 % NA SOLN: 1.0000 | Freq: Two times a day (BID) | NASAL | 5 refills | Status: DC

## 2023-02-17 MED ORDER — FLUTICASONE PROPIONATE 50 MCG/ACT NA SUSP: 2.0000 | Freq: Every day | NASAL | 5 refills | Status: DC

## 2023-02-17 NOTE — Patient Instructions (Addendum)
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   - Further labworkup with IgM, IgG and titers has been normal.  Infectious are likely mostly viral.   - If you ever require a transfusion, please let them know you are IgA deficient.  - Normal spirometry in the past, low suspicion for asthma.   Chronic Rhinitis: - Positive skin test 11/2022: none - Use nasal saline rinses before nose sprays such as with Neilmed Sinus Rinse.  Use distilled water.   - Use Flonase 2 sprays each nostril daily. Aim upward and outward. - Use Azelastine 1-2 sprays each nostril twice daily as needed. Aim upward and outward.

## 2023-02-17 NOTE — Progress Notes (Signed)
   FOLLOW UP Date of Service/Encounter:  02/17/23   Subjective:  Tamara Dodson (DOB: 1961/05/31) is a 62 y.o. female who returns to the [facility] on 02/17/2023 for follow up for IgA deficiency, recurrent infections with wheezing/SOB and chronic rhinitis.   History obtained from: chart review and patient. Last visit was with me on November 18, 2022 for recurrent infections, rhinosinusitis.  She underwent skin testing at that time and was negative.  We also did basic immune evaluation and she had normal IgG and IgM with good response to pneumococcal, tetanus and diphtheria.  IgA levels low.  Since last visit, she denies any trouble with wheezing or shortness of breath.  She did have an upper respiratory infection a few weeks ago and has had improvement in her cough but still having a lot of congestion.  They did treat her with a course of antibiotics without much improvement and also give her Tessalon Perles and codeine syrup.  She is using nasal rinses and Mucinex as needed but not doing any nose sprays.  No other infections outside of this.  Past Medical History: Past Medical History:  Diagnosis Date   Anemia    Arthritis    osteoarthrosis   Cirrhosis ([place])    seeing Dr Tasia Catchings   Erythrocytosis    Fatty liver    Gluten intolerance    Headache    migraines   Hypertension    Iron overload 07/18/2019   Lumbago    Lymph edema    lower extremities   Morbid obesity (HCC)    Neuromuscular disorder (HCC)    neuropathy - both feet   Vertigo     Objective:  BP (!) 142/84 (BP Location: Left Arm, Patient Position: Sitting, Cuff Size: Large)   Pulse (!) 103   Temp (!) 97.2 F (36.2 C) (Temporal)   Resp 18   Ht 5' 9.5" (1.765 m)   Wt (!) 309 lb 3.2 oz (140.3 kg)   SpO2 97%   BMI 45.01 kg/m  Body mass index is 45.01 kg/m. Physical Exam: GEN: alert, well developed HEENT: clear conjunctiva, nose with moderate inferior turbinate hypertrophy, pink nasal mucosa, no  rhinorrhea, no cobblestoning HEART: regular rate and rhythm, no murmur LUNGS: clear to auscultation bilaterally, no coughing, unlabored respiration SKIN: no rashes or lesions     Assessment:   1. Recurrent infections   2. Chronic rhinitis   3. 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   - Further labworkup with IgM, IgG and titers has been normal.  Infectious are mostly likely viral.  Discussed preventative methods today.  - If you ever require a transfusion, please let them know you are IgA deficient.  - Normal spirometry in the past, low suspicion for asthma.   Chronic Rhinitis: - Positive skin test 11/2022: none - Use nasal saline rinses before nose sprays such as with Neilmed Sinus Rinse.  Use distilled water.   - Use Flonase 2 sprays each nostril daily. Aim upward and outward. - Use Azelastine 1-2 sprays each nostril twice daily as needed. Aim upward and outward.   Return if symptoms worsen or fail to improve.  Harlon Flor, MD [facility] [place]

## 2023-02-22 ENCOUNTER — Other Ambulatory Visit: Payer: Self-pay | Admitting: Orthopedic Surgery

## 2023-02-22 DIAGNOSIS — M5412 Radiculopathy, cervical region: Secondary | ICD-10-CM

## 2023-02-22 DIAGNOSIS — G8929 Other chronic pain: Secondary | ICD-10-CM

## 2023-03-01 ENCOUNTER — Ambulatory Visit
Admission: RE | Admit: 2023-03-01 | Discharge: 2023-03-01 | Disposition: A | Discharge status: Home / Self Care | Payer: BC Managed Care – PPO | Source: Ambulatory Visit | Attending: Orthopedic Surgery | Admitting: Orthopedic Surgery

## 2023-03-01 DIAGNOSIS — M5412 Radiculopathy, cervical region: Secondary | ICD-10-CM

## 2023-03-01 DIAGNOSIS — M25512 Pain in left shoulder: Secondary | ICD-10-CM | POA: Diagnosis present

## 2023-03-01 DIAGNOSIS — G8929 Other chronic pain: Secondary | ICD-10-CM | POA: Insufficient documentation

## 2023-03-22 ENCOUNTER — Encounter: Payer: Self-pay | Admitting: Internal Medicine

## 2023-03-22 ENCOUNTER — Ambulatory Visit: Payer: BC Managed Care – PPO | Admitting: Anesthesiology

## 2023-03-22 ENCOUNTER — Encounter
Admission: RE | Disposition: A | Discharge status: Home / Self Care | Payer: Self-pay | Source: Home / Self Care | Attending: Internal Medicine

## 2023-03-22 ENCOUNTER — Ambulatory Visit
Admission: RE | Admit: 2023-03-22 | Discharge: 2023-03-22 | Disposition: A | Discharge status: Home / Self Care | Payer: BC Managed Care – PPO | Attending: Internal Medicine | Admitting: Internal Medicine

## 2023-03-22 ENCOUNTER — Other Ambulatory Visit: Payer: Self-pay

## 2023-03-22 DIAGNOSIS — R1310 Dysphagia, unspecified: Secondary | ICD-10-CM | POA: Diagnosis not present

## 2023-03-22 DIAGNOSIS — K7460 Unspecified cirrhosis of liver: Secondary | ICD-10-CM | POA: Diagnosis not present

## 2023-03-22 DIAGNOSIS — K760 Fatty (change of) liver, not elsewhere classified: Secondary | ICD-10-CM | POA: Insufficient documentation

## 2023-03-22 DIAGNOSIS — Z6841 Body Mass Index (BMI) 40.0 and over, adult: Secondary | ICD-10-CM | POA: Insufficient documentation

## 2023-03-22 DIAGNOSIS — Z96641 Presence of right artificial hip joint: Secondary | ICD-10-CM | POA: Diagnosis not present

## 2023-03-22 DIAGNOSIS — M1990 Unspecified osteoarthritis, unspecified site: Secondary | ICD-10-CM | POA: Insufficient documentation

## 2023-03-22 DIAGNOSIS — I10 Essential (primary) hypertension: Secondary | ICD-10-CM | POA: Insufficient documentation

## 2023-03-22 DIAGNOSIS — K641 Second degree hemorrhoids: Secondary | ICD-10-CM | POA: Insufficient documentation

## 2023-03-22 DIAGNOSIS — Z9049 Acquired absence of other specified parts of digestive tract: Secondary | ICD-10-CM | POA: Insufficient documentation

## 2023-03-22 DIAGNOSIS — K2970 Gastritis, unspecified, without bleeding: Secondary | ICD-10-CM | POA: Insufficient documentation

## 2023-03-22 DIAGNOSIS — Z1211 Encounter for screening for malignant neoplasm of colon: Secondary | ICD-10-CM | POA: Diagnosis present

## 2023-03-22 DIAGNOSIS — K219 Gastro-esophageal reflux disease without esophagitis: Secondary | ICD-10-CM | POA: Insufficient documentation

## 2023-03-22 DIAGNOSIS — Z96653 Presence of artificial knee joint, bilateral: Secondary | ICD-10-CM | POA: Diagnosis not present

## 2023-03-22 DIAGNOSIS — D120 Benign neoplasm of cecum: Secondary | ICD-10-CM | POA: Insufficient documentation

## 2023-03-22 HISTORY — PX: COLONOSCOPY: SHX5424

## 2023-03-22 HISTORY — PX: ESOPHAGOGASTRODUODENOSCOPY: SHX5428

## 2023-03-22 SURGERY — COLONOSCOPY
Anesthesia: General

## 2023-03-22 MED ORDER — PROPOFOL 500 MG/50ML IV EMUL
INTRAVENOUS | Status: DC | PRN
  Administered 2023-03-22: 75 ug/kg/min via INTRAVENOUS

## 2023-03-22 MED ORDER — PROPOFOL 10 MG/ML IV BOLUS
INTRAVENOUS | Status: DC | PRN
  Administered 2023-03-22: 50 mg via INTRAVENOUS
  Administered 2023-03-22: 40 mg via INTRAVENOUS
  Administered 2023-03-22: 80 mg via INTRAVENOUS
  Administered 2023-03-22: 30 mg via INTRAVENOUS

## 2023-03-22 MED ORDER — SODIUM CHLORIDE 0.9 % IV SOLN: INTRAVENOUS | Status: DC

## 2023-03-22 MED ORDER — PROPOFOL 10 MG/ML IV BOLUS
INTRAVENOUS | Status: AC
  Filled 2023-03-22: qty 40

## 2023-03-22 MED ORDER — LIDOCAINE HCL (CARDIAC) PF 100 MG/5ML IV SOSY
PREFILLED_SYRINGE | INTRAVENOUS | Status: DC | PRN
  Administered 2023-03-22: 50 mg via INTRAVENOUS

## 2023-03-22 NOTE — Interval H&P Note (Signed)
History and Physical Interval Note:  03/22/2023 1:08 PM  Tamara Dodson  has presented today for surgery, with the diagnosis of Dysphagia, unspecified type (R13.10) Colon cancer screening (Z12.11).  The various methods of treatment have been discussed with the patient and family. After consideration of risks, benefits and other options for treatment, the patient has consented to  Procedure(s): COLONOSCOPY (N/A) ESOPHAGOGASTRODUODENOSCOPY (EGD) (N/A) as a surgical intervention.  The patient's history has been reviewed, patient examined, no change in status, stable for surgery.  I have reviewed the patient's chart and labs.  Questions were answered to the patient's satisfaction.     [place], [place]

## 2023-03-22 NOTE — Op Note (Signed)
[facility] Gastroenterology Patient Name: Tamara Dodson Procedure Date: 03/22/2023 1:08 PM MRN: [id number] Account #: [id number] Date of Birth: 11/21/1961 Admit Type: Outpatient Age: 62 Room: [facility] ENDO ROOM 2 Gender: Female Note Status: Finalized Instrument Name: Park Meo [id number] Procedure:             Colonoscopy Indications:           Screening for colorectal malignant neoplasm Providers:             Lorie Apley K. Bellanie Matthew MD, MD Medicines:             Propofol per Anesthesia Complications:         No immediate complications. Procedure:             Pre-Anesthesia Assessment:                        - The risks and benefits of the procedure and the                         sedation options and risks were discussed with the                         patient. All questions were answered and informed                         consent was obtained.                        - Patient identification and proposed procedure were                         verified prior to the procedure by the nurse. The                         procedure was verified in the procedure room.                        - ASA Grade Assessment: III - A patient with severe                         systemic disease.                        - After reviewing the risks and benefits, the patient                         was deemed in satisfactory condition to undergo the                         procedure.                        After obtaining informed consent, the colonoscope was                         passed under direct vision. Throughout the procedure,                         the patient's blood pressure, pulse, and oxygen  saturations were monitored continuously. The                         Colonoscope was introduced through the anus and                         advanced to the the cecum, identified by appendiceal                         orifice and ileocecal valve. The colonoscopy was                          performed without difficulty. The patient tolerated                         the procedure well. The quality of the bowel                         preparation was adequate. The ileocecal valve,                         appendiceal orifice, and rectum were photographed. Findings:      The perianal exam findings include internal hemorrhoids that prolapse       with straining, but spontaneously regress to the resting position (Grade       II).      Internal hemorrhoids were found during retroflexion. The hemorrhoids       were Grade II (internal hemorrhoids that prolapse but reduce       spontaneously).      A 15 mm polyp was found in the cecum. The polyp was sessile. The polyp       was removed with a hot snare. Resection and retrieval were complete. To       prevent bleeding after the polypectomy, one hemostatic clip was       successfully placed (MR conditional). Clip manufacturer: [employer]. There was no bleeding during, or at the end, of the       procedure.      The exam was otherwise without abnormality. Impression:            - Internal hemorrhoids that prolapse with straining,                         but spontaneously regress to the resting position                         (Grade II) found on perianal exam.                        - Internal hemorrhoids.                        - One 15 mm polyp in the cecum, removed with a hot                         snare. Resected and retrieved. Clip (MR conditional)                         was placed. Clip manufacturer: [employer].                        -  The examination was otherwise normal. Recommendation:        - To visualize the small bowel, perform video capsule                         endoscopy at appointment to be scheduled.                        - Patient has a contact number available for                         emergencies. The signs and symptoms of potential                         delayed  complications were discussed with the patient.                         Return to normal activities tomorrow. Written                         discharge instructions were provided to the patient.                        - Resume previous diet.                        - Continue present medications.                        - Repeat colonoscopy is recommended for surveillance.                         The colonoscopy date will be determined after                         pathology results from today's exam become available                         for review.                        - Telephone GI office to schedule appointment in 2                         months. Procedure Code(s):     --- Professional ---                        [id number], Colonoscopy, flexible; with removal of                         tumor(s), polyp(s), or other lesion(s) by snare                         technique Diagnosis Code(s):     --- Professional ---                        K64.1, Second degree hemorrhoids                        D12.0, Benign neoplasm of cecum  Z12.11, Encounter for screening for malignant neoplasm                         of colon CPT copyright 2022 American [employer] Association. All rights reserved. The codes documented in this report are preliminary and upon coder review may  be revised to meet current compliance requirements. Efrain Sella MD, MD 03/22/2023 1:44:54 PM This report has been signed electronically. Number of Addenda: 0 Note Initiated On: 03/22/2023 1:08 PM Scope Withdrawal Time: 0 hours 6 minutes 55 seconds  Total Procedure Duration: 0 hours 10 minutes 1 second  Estimated Blood Loss:  Estimated blood loss: none.      [facility]

## 2023-03-22 NOTE — Anesthesia Postprocedure Evaluation (Signed)
Anesthesia Post Note  Patient: Tamara Dodson  Procedure(s) Performed: COLONOSCOPY ESOPHAGOGASTRODUODENOSCOPY (EGD)  Patient location during evaluation: Endoscopy Anesthesia Type: General Level of consciousness: awake and alert Pain management: pain level controlled Vital Signs Assessment: post-procedure vital signs reviewed and stable Respiratory status: spontaneous breathing, nonlabored ventilation, respiratory function stable and patient connected to nasal cannula oxygen Cardiovascular status: blood pressure returned to baseline and stable Postop Assessment: no apparent nausea or vomiting Anesthetic complications: no   No notable events documented.   Last Vitals:  Vitals:   03/22/23 1354 03/22/23 1404  BP: (!) 140/83 133/85  Pulse:  87  Resp:    Temp:    SpO2:  96%    Last Pain:  Vitals:   03/22/23 1404  TempSrc:   PainSc: 0-No pain                 Precious Haws Aliegha Paullin

## 2023-03-22 NOTE — H&P (Signed)
Outpatient short stay form Pre-procedure 03/22/2023 1:06 PM Tamara Dodson K. Alice Reichert, M.D.  Primary Physician: [name], M.D.  Reason for visit:  Dysphagia, Colon cancer screening  History of present illness:  [name] reports overall doing fairly well from GI standpoint. She continues to have some issues with alternating constipation & diarrhea. Will have a day of normal stools followed by missing a bowel movement for 1 to 2 days then a few loose stools. She occasionally has some abdominal grumbling at night but denies significant nocturnal diarrhea. She denies any blood in stool. She completely avoids gluten. She denies nausea or vomiting. She has GERD and takes Pepcid as needed for reflux symptoms which seems to help symptoms, does not take daily or often.  She has had some worsening dysphagia over the past few months. She notes this worsened after a neck surgery. It occurs to any solid consistency food but does not occur with liquids. She only talks 1 to 2 pills at a time to prevent pill dysphagia.  She has had issues with recurrent infections recently and has saw allergist for low IgA. Feels that her family members will get a viral illness for 1 to 2 days but will last over a week in herself.  Denies alcohol and tobacco intake. Takes naproxen nightly.  COLONOSCOPY 08/22/2013  Int/Ext Hemorrhoids: CBF 08/2023: (Updated CBF to reflect 10 years)   Current Facility-Administered Medications:    0.9 %  sodium chloride infusion, , Intravenous, Continuous, [name], [name], MD, Last Rate: 20 mL/hr at 03/22/23 1300, New Bag at 03/22/23 1300  Medications Prior to Admission  Medication Sig Dispense Refill Last Dose   amLODipine (NORVASC) 5 MG tablet Take 1 tablet by mouth daily.   03/22/2023 at 0630   aspirin EC 81 MG tablet Take 81 mg by mouth once.   Past Week   azelastine (ASTELIN) 0.1 % nasal spray Place 1 spray into both nostrils 2 (two) times daily. Use in each nostril as directed 30 mL 5 Past  Week   EMGALITY 120 MG/ML SOAJ Inject 120 mg as directed every 30 (thirty) days.   Past Week   etodolac (LODINE) 500 MG tablet Take 1 tablet by mouth 2 (two) times daily.   Past Week   fluticasone (FLONASE) 50 MCG/ACT nasal spray Place 2 sprays into both nostrils daily. 16 g 5 Past Week   furosemide (LASIX) 20 MG tablet Take 40 mg by mouth in the morning.   Past Week   gabapentin (NEURONTIN) 300 MG capsule Take 300 mg by mouth 3 (three) times daily.   Past Week   losartan (COZAAR) 50 MG tablet Take 1 tablet by mouth daily.   03/22/2023 at 0630   meclizine (ANTIVERT) 12.5 MG tablet Take 12.5 mg by mouth 3 (three) times daily.   Past Week   nortriptyline (PAMELOR) 10 MG capsule Take 40 mg by mouth at bedtime.   Past Week   polyethylene glycol powder (GLYCOLAX/MIRALAX) 17 GM/SCOOP powder Take by mouth.   Past Week   propranolol (INDERAL) 40 MG tablet Take 40 mg by mouth 2 (two) times daily.   Past Week   rizatriptan (MAXALT) 10 MG tablet Take 10 mg by mouth every 2 (two) hours as needed for migraine (max 2 doses/24 hrs.). May repeat in 2 hours if needed   Past Week   methocarbamol (ROBAXIN) 500 MG tablet Take 1-2 tablets (500-1,000 mg total) by mouth every 6 (six) hours as needed for muscle spasms. (Patient not taking: Reported on  03/15/2023) 60 tablet 0 Not Taking     Allergies  Allergen Reactions   Gluten Meal Diarrhea    bloating     Past Medical History:  Diagnosis Date   Anemia    Arthritis    osteoarthrosis   Cirrhosis    seeing Dr Tasia Catchings   Erythrocytosis    Fatty liver    Gluten intolerance    Headache    migraines   Hypertension    Iron overload 07/18/2019   Lumbago    Lymph edema    lower extremities   Morbid obesity    Neuromuscular disorder    neuropathy - both feet   Vertigo     Review of systems:  Otherwise negative.    Physical Exam  Gen: Alert, oriented. Appears stated age.  HEENT: [place]/AT. PERRLA. Lungs: CTA, no wheezes. CV: RR nl S1, S2. Abd: soft, benign,  no masses. BS+ Ext: No edema. Pulses 2+    Planned procedures: Proceed with EGD and colonoscopy. The patient understands the nature of the planned procedure, indications, risks, alternatives and potential complications including but not limited to bleeding, infection, perforation, damage to internal organs and possible oversedation/side effects from anesthesia. The patient agrees and gives consent to proceed.  Please refer to procedure notes for findings, recommendations and patient disposition/instructions.     Marla Pouliot K. Alice Reichert, M.D. Gastroenterology 03/22/2023  1:06 PM

## 2023-03-22 NOTE — Anesthesia Preprocedure Evaluation (Signed)
Anesthesia Evaluation  Patient identified by MRN, date of birth, ID band Patient awake    Reviewed: Allergy & Precautions, NPO status , Patient's Chart, lab work & pertinent test results  History of Anesthesia Complications Negative for: history of anesthetic complications  Airway Mallampati: III  TM Distance: >3 FB Neck ROM: limited    Dental  (+) Chipped, Missing, Poor Dentition   Pulmonary neg pulmonary ROS, neg shortness of breath   Pulmonary exam normal        Cardiovascular Exercise Tolerance: Good hypertension, (-) angina Normal cardiovascular exam     Neuro/Psych  Headaches  Neuromuscular disease  negative psych ROS   GI/Hepatic Neg liver ROS,GERD  Controlled,,  Endo/Other  negative endocrine ROS    Renal/GU negative Renal ROS  negative genitourinary   Musculoskeletal   Abdominal   Peds  Hematology negative hematology ROS (+)   Anesthesia Other Findings Patient reports that they do not think that any food or pills are stuck in their throat at this time.  Past Medical History: No date: Anemia No date: Arthritis     Comment:  osteoarthrosis No date: Cirrhosis [facility])     Comment:  seeing Dr Tasia Catchings No date: Erythrocytosis No date: Fatty liver No date: Gluten intolerance No date: Headache     Comment:  migraines No date: Hypertension 07/18/2019: Iron overload No date: Lumbago No date: Lymph edema     Comment:  lower extremities No date: Morbid obesity ([place]) No date: Neuromuscular disorder ([place])     Comment:  neuropathy - both feet No date: Vertigo  Past Surgical History: 02/24/2021: ANTERIOR CERVICAL DECOMP/DISCECTOMY FUSION; N/A     Comment:  Procedure: ANTERIOR CERVICAL DECOMPRESSION/DISCECTOMY               FUSION 2 LEVELS C3-5;  Surgeon: Meade Maw, MD;                Location: [facility] ORS;  Service: Neurosurgery;  Laterality:               N/A; 01/26/2022: ANTERIOR CERVICAL  DECOMP/DISCECTOMY FUSION; N/A     Comment:  Procedure: C5-6 ANTERIOR CERVICAL DISCECTOMY & FUSION               (GLOBUS HEDRON);  Surgeon: Meade Maw, MD;                Location: [facility] ORS;  Service: Neurosurgery;  Laterality:               N/A; No date: CHOLECYSTECTOMY No date: COLONOSCOPY WITH ESOPHAGOGASTRODUODENOSCOPY (EGD) 01/25/2021: ESOPHAGOGASTRODUODENOSCOPY; N/A     Comment:  Procedure: ESOPHAGOGASTRODUODENOSCOPY (EGD);  Surgeon:               [place], [name], MD;  Location: [facility] ENDOSCOPY;                Service: Gastroenterology;  Laterality: N/A;  C-19 test               on 01/22/2021 Requesting to be the last case due to her               transportation No date: JOINT REPLACEMENT; Left     Comment:  knee, hip No date: JOINT REPLACEMENT; Bilateral     Comment:  knee 09/21/2015: KNEE ARTHROPLASTY; Right     Comment:  Procedure: COMPUTER ASSISTED TOTAL KNEE ARTHROPLASTY;                Surgeon: Dereck Leep, MD;  Location: [facility] ORS;                Service: Orthopedics;  Laterality: Right; 04/11/2022: SHOULDER ARTHROSCOPY; Left     Comment:  Procedure: Left shoulder arthroscopic regeneten patch,               biceps tenodesis, subacromial decompression;  Surgeon:               Leim Fabry, MD;  Location: [facility] ORS;  Service:               Orthopedics;  Laterality: Left; 08/11/2022: SHOULDER ARTHROSCOPY WITH OPEN ROTATOR CUFF REPAIR; Left     Comment:  Procedure: Left revision shoulder arthroscopic rotator               cuff repair and extensive glenohumeral debridement;                Surgeon: Leim Fabry, MD;  Location: [place];  Service: Orthopedics;  Laterality: Left;  needs               potassium draw No date: TONSILLECTOMY 11/17/2017: TOTAL HIP ARTHROPLASTY; Right     Comment:  Procedure: TOTAL HIP ARTHROPLASTY;  Surgeon: Dereck Leep, MD;  Location: [facility] ORS;  Service: Orthopedics;               Laterality:  Right; No date: WISDOM TOOTH EXTRACTION     Reproductive/Obstetrics negative OB ROS                             Anesthesia Physical Anesthesia Plan  ASA: 3  Anesthesia Plan: General   Post-op Pain Management:    Induction: Intravenous  PONV Risk Score and Plan: Propofol infusion and TIVA  Airway Management Planned: Natural Airway and Nasal Cannula  Additional Equipment:   Intra-op Plan:   Post-operative Plan:   Informed Consent: I have reviewed the patients History and Physical, chart, labs and discussed the procedure including the risks, benefits and alternatives for the proposed anesthesia with the patient or authorized representative who has indicated his/her understanding and acceptance.     Dental Advisory Given  Plan Discussed with: Anesthesiologist, CRNA and Surgeon  Anesthesia Plan Comments: (Patient consented for risks of anesthesia including but not limited to:  - adverse reactions to medications - risk of airway placement if required - damage to eyes, teeth, lips or other oral mucosa - nerve damage due to positioning  - sore throat or hoarseness - Damage to heart, brain, nerves, lungs, other parts of body or loss of life  Patient voiced understanding.)       Anesthesia Quick Evaluation

## 2023-03-22 NOTE — Op Note (Signed)
[facility] Gastroenterology Patient Name: Tamara Dodson Procedure Date: 03/22/2023 1:08 PM MRN: [id number] Account #: [id number] Date of Birth: [birth year] Admit Type: Outpatient Age: 62 Room: [facility] ENDO ROOM 2 Gender: Female Note Status: Finalized Instrument Name: Upper Endoscope [id number] Procedure:             Upper GI endoscopy Indications:           Dysphagia Providers:             Lorie Apley K. Kursten Kruk MD, MD Medicines:             Propofol per Anesthesia Complications:         No immediate complications. Procedure:             Pre-Anesthesia Assessment:                        - The risks and benefits of the procedure and the                         sedation options and risks were discussed with the                         patient. All questions were answered and informed                         consent was obtained.                        - Patient identification and proposed procedure were                         verified prior to the procedure by the nurse. The                         procedure was verified in the procedure room.                        - ASA Grade Assessment: III - A patient with severe                         systemic disease.                        - After reviewing the risks and benefits, the patient                         was deemed in satisfactory condition to undergo the                         procedure.                        After obtaining informed consent, the endoscope was                         passed under direct vision. Throughout the procedure,                         the patient's blood pressure, pulse, and oxygen  saturations were monitored continuously. The Endoscope                         was introduced through the mouth, and advanced to the                         third part of duodenum. The upper GI endoscopy was                         accomplished without difficulty. The patient tolerated                          the procedure well. Findings:      No endoscopic abnormality was evident in the esophagus to explain the       patient's complaint of dysphagia.      Normal mucosa was found in the entire esophagus.      Patchy mild inflammation characterized by congestion (edema) and       erythema was found in the entire examined stomach.      The exam was otherwise without abnormality.      The examined duodenum was normal.      The exam was otherwise without abnormality. Impression:            - No endoscopic esophageal abnormality to explain                         patient's dysphagia.                        - Normal mucosa was found in the entire esophagus.                        - Gastritis.                        - The examination was otherwise normal.                        - Normal examined duodenum.                        - The examination was otherwise normal.                        - No specimens collected. Recommendation:        - Perform a cine-esophagram at appointment to be                         scheduled.                        - Proceed with colonoscopy Procedure Code(s):     --- Professional ---                        [phone], Esophagogastroduodenoscopy, flexible,                         transoral; diagnostic, including collection of                         specimen(s)  by brushing or washing, when performed                         (separate procedure) Diagnosis Code(s):     --- Professional ---                        K29.70, Gastritis, unspecified, without bleeding                        R13.10, Dysphagia, unspecified CPT copyright 2022 American Medical Association. All rights reserved. The codes documented in this report are preliminary and upon coder review may  be revised to meet current compliance requirements. Efrain Sella MD, MD 03/22/2023 1:27:59 PM This report has been signed electronically. Number of Addenda: 0 Note Initiated On: 03/22/2023 1:08  PM Estimated Blood Loss:  Estimated blood loss: none.      [facility]

## 2023-03-22 NOTE — Transfer of Care (Signed)
Immediate Anesthesia Transfer of Care Note  Patient: Tamara Dodson  Procedure(s) Performed: COLONOSCOPY ESOPHAGOGASTRODUODENOSCOPY (EGD)  Patient Location: Endoscopy Unit  Anesthesia Type:General  Level of Consciousness: awake, alert , and oriented  Airway & Oxygen Therapy: Patient Spontanous Breathing  Post-op Assessment: Report given to RN and Post -op Vital signs reviewed and stable  Post vital signs: Reviewed and stable  Last Vitals:  Vitals Value Taken Time  BP 121/72 03/22/23 1344  Temp    Pulse 91 03/22/23 1344  Resp 16 03/22/23 1345  SpO2 99 % 03/22/23 1344  Vitals shown include unvalidated device data.  Last Pain:  Vitals:   03/22/23 1248  TempSrc: Temporal  PainSc: 0-No pain         Complications: No notable events documented.

## 2023-03-23 ENCOUNTER — Encounter: Payer: Self-pay | Admitting: Internal Medicine

## 2023-03-23 LAB — SURGICAL PATHOLOGY

## 2023-04-12 ENCOUNTER — Inpatient Hospital Stay
Admission: RE | Admit: 2023-04-12 | Discharge: 2023-04-12 | Disposition: A | Discharge status: Home / Self Care | Payer: Self-pay | Source: Ambulatory Visit | Attending: Neurosurgery | Admitting: Neurosurgery

## 2023-04-12 ENCOUNTER — Other Ambulatory Visit: Payer: Self-pay

## 2023-04-12 DIAGNOSIS — Z049 Encounter for examination and observation for unspecified reason: Secondary | ICD-10-CM

## 2023-04-20 ENCOUNTER — Ambulatory Visit (INDEPENDENT_AMBULATORY_CARE_PROVIDER_SITE_OTHER): Payer: BC Managed Care – PPO | Admitting: Neurosurgery

## 2023-04-20 ENCOUNTER — Encounter: Payer: Self-pay | Admitting: Neurosurgery

## 2023-04-20 VITALS — BP 130/68 | HR 78 | Ht 69.0 in | Wt 305.2 lb

## 2023-04-20 DIAGNOSIS — R200 Anesthesia of skin: Secondary | ICD-10-CM

## 2023-04-20 DIAGNOSIS — M50123 Cervical disc disorder at C6-C7 level with radiculopathy: Secondary | ICD-10-CM | POA: Diagnosis not present

## 2023-04-20 DIAGNOSIS — M542 Cervicalgia: Secondary | ICD-10-CM

## 2023-04-20 DIAGNOSIS — M5412 Radiculopathy, cervical region: Secondary | ICD-10-CM

## 2023-04-20 NOTE — Progress Notes (Signed)
   DOS: 01/26/22 (C5-6 ACDF)  HISTORY OF PRESENT ILLNESS: 04/20/2023 Tamara Dodson continues to have some pain in the left side of her neck that extends towards her arm.  She has pain around her left shoulder.  She also has some numbness intermittently in her left hand.  She has difficulty with turning her neck.  She had both of her shoulders operated on last year.  When I last saw her, she was actually doing pretty well.  This is impacting her on a daily basis.  10/20/2022 Tamara Dodson is status post ACDF.  Unfortunate, she has had 2 rotator cuff repairs this year.  She still having some pain from that.  Overall, she is doing well.  She does have some heaviness in her neck and head at the end of the day.  She is taking Tylenol for that.  PHYSICAL EXAMINATION:   Vitals:   04/20/23 1129  BP: 130/68  Pulse: 78  SpO2: 98%   General: Patient is well developed, well nourished, calm, collected, and in no apparent distress.  NEUROLOGICAL:  General: In no acute distress.  Awake, alert, oriented to person, place, and time. Pupils equal round and reactive to light.   Strength: Side Biceps Triceps Deltoid Interossei Grip Wrist Ext. Wrist Flex.  R 5 5 5 5 5 5 5   L 5 5 5 5 5 5 5    Incision c/d/i   ROS (Neurologic): Negative except as noted above  IMAGING: No significant complication.  There is some settling at C5-6. MRI C spine 03/01/2023  IMPRESSION: 1. 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 Moderate left foraminal stenosis. Mild spinal stenosis. 2. Anterior cervical fusion from C3 through C6. At C4-5 there is moderate-severe left foraminal stenosis. At C5-6 there is moderate bilateral foraminal stenosis. 3. No acute osseous injury of the cervical spine.     Electronically Signed   By: Elige Ko M.D.  ASSESSMENT/PLAN:  Tamara Dodson is doing fair.  She has had some worsening of her shoulder issues, but also has  some symptoms that are concerning for possible radicular type symptoms.  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  This could cause some radicular symptoms.  I recommended that she start with physical therapy.  Will also get a nerve conduction study.  We discussed injections, but we will see how she does with physical therapy before considering this.     I spent a total of 10 minutes in face-to-face and non-face-to-face activities related to this patient's care today.   Venetia Night MD, [facility] Department of Neurosurgery

## 2023-05-08 ENCOUNTER — Telehealth: Payer: Self-pay

## 2023-05-08 NOTE — Telephone Encounter (Signed)
Noah Delaine, PT from [facility], called to notify us that she has discharged Tamara Dodson from PT today (at her 2nd visit). She is having increased symptoms and is very guarded.  French Ana has also noticed significant temperature changes in both of the patient's arms. She is also having dizziness (she is on meclizine and saw Neurology for this last week).  Her EMG is on 05/29/23 and she is scheduled for follow up with our office on 06/20/23, but she told the patient to call our office if she is feeling worse between now and then.

## 2023-06-20 ENCOUNTER — Ambulatory Visit (INDEPENDENT_AMBULATORY_CARE_PROVIDER_SITE_OTHER): Payer: BC Managed Care – PPO | Admitting: Neurosurgery

## 2023-06-20 ENCOUNTER — Encounter: Payer: Self-pay | Admitting: Oncology

## 2023-06-20 ENCOUNTER — Encounter: Payer: Self-pay | Admitting: Neurosurgery

## 2023-06-20 VITALS — BP 130/77 | Ht 69.5 in | Wt 305.3 lb

## 2023-06-20 DIAGNOSIS — M542 Cervicalgia: Secondary | ICD-10-CM

## 2023-06-20 DIAGNOSIS — M5412 Radiculopathy, cervical region: Secondary | ICD-10-CM

## 2023-06-20 NOTE — Progress Notes (Signed)
   DOS: 01/26/22 (C5-6 ACDF)  HISTORY OF PRESENT ILLNESS: 06/20/2023 She went to physical therapy for her neck and arm, but was discharged due to being an inappropriate candidate.  She still has pain in her left shoulder and down her arm.  She is having some numbness worse in her left hand than the right.  04/20/2023 Tamara Dodson continues to have some pain in the left side of her neck that extends towards her arm.  She has pain around her left shoulder.  She also has some numbness intermittently in her left hand.  She has difficulty with turning her neck.  She had both of her shoulders operated on last year.  When I last saw her, she was actually doing pretty well.  This is impacting her on a daily basis.  10/20/2022 Tamara Dodson is status post ACDF.  Unfortunate, she has had 2 rotator cuff repairs this year.  She still having some pain from that.  Overall, she is doing well.  She does have some heaviness in her neck and head at the end of the day.  She is taking Tylenol for that.  PHYSICAL EXAMINATION:   Vitals:   06/20/23 1128  BP: 130/77   General: Patient is well developed, well nourished, calm, collected, and in no apparent distress.  NEUROLOGICAL:  General: In no acute distress.  Awake, alert, oriented to person, place, and time. Pupils equal round and reactive to light.   Strength: Side Biceps Triceps Deltoid Interossei Grip Wrist Ext. Wrist Flex.  R 5 5 5 5 5 5 5   L 5 5 5 5 5 5 5    Incision c/d/i   ROS (Neurologic): Negative except as noted above  IMAGING: No significant complication.  There is some settling at C5-6. MRI C spine 03/01/2023  IMPRESSION: 1. 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 Moderate left foraminal stenosis. Mild spinal stenosis. 2. Anterior cervical fusion from C3 through C6. At C4-5 there is moderate-severe left foraminal stenosis. At C5-6 there is moderate bilateral foraminal  stenosis. 3. No acute osseous injury of the cervical spine.     Electronically Signed   By: Elige Ko M.D.  EMG 05/29/2023 Impression: Abnormal study. There is electrodiagnostic evidence of chronic, moderate right and mild left carpal tunnel syndrome.  Thank you for the referral of this patient. It was our privilege to participate in care of your patient. Feel free to contact us with any further questions. _____________________________ Theora Master, MD   ASSESSMENT/PLAN:  Tamara Dodson is doing fair.  She has had some worsening of her shoulder issues, but also has some symptoms that are concerning for possible radicular type symptoms.  I would like to get a diagnostic injection at C6-7 on the left to see how much of this pain is related to her disc herniation at C6-7.  I will send her for vocal cord clearance.    I would like to have a telephone visit with her 1 week after her injection.      I spent a total of 10 minutes in face-to-face and non-face-to-face activities related to this patient's care today.   Venetia Night MD, [facility] Department of Neurosurgery

## 2023-08-01 ENCOUNTER — Other Ambulatory Visit: Payer: Self-pay

## 2023-08-01 ENCOUNTER — Telehealth: Payer: Self-pay

## 2023-08-01 ENCOUNTER — Ambulatory Visit (INDEPENDENT_AMBULATORY_CARE_PROVIDER_SITE_OTHER): Payer: BC Managed Care – PPO | Admitting: Neurosurgery

## 2023-08-01 DIAGNOSIS — M5412 Radiculopathy, cervical region: Secondary | ICD-10-CM

## 2023-08-01 NOTE — Progress Notes (Signed)
   DOS: 01/26/22 (C5-6 ACDF)  HISTORY OF PRESENT ILLNESS: 08/01/2023 Ms. Biba had her injection and had a short period of relief.  Her pain is then returned and similar to what it was when I last saw her.  06/20/2023 She went to physical therapy for her neck and arm, but was discharged due to being an inappropriate candidate.  She still has pain in her left shoulder and down her arm.  She is having some numbness worse in her left hand than the right.  04/20/2023 Ms. Forrister continues to have some pain in the left side of her neck that extends towards her arm.  She has pain around her left shoulder.  She also has some numbness intermittently in her left hand.  She has difficulty with turning her neck.  She had both of her shoulders operated on last year.  When I last saw her, she was actually doing pretty well.  This is impacting her on a daily basis.  10/20/2022 Ms. Shiane Rehrer is status post ACDF.  Unfortunate, she has had 2 rotator cuff repairs this year.  She still having some pain from that.  Overall, she is doing well.  She does have some heaviness in her neck and head at the end of the day.  She is taking Tylenol for that.  PHYSICAL EXAMINATION:  Telephone visit  IMAGING: No significant complication.  There is some settling at C5-6. MRI C spine 03/01/2023  IMPRESSION: 1. 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 Moderate left foraminal stenosis. Mild spinal stenosis. 2. Anterior cervical fusion from C3 through C6. At C4-5 there is moderate-severe left foraminal stenosis. At C5-6 there is moderate bilateral foraminal stenosis. 3. No acute osseous injury of the cervical spine.     Electronically Signed   By: Elige Ko M.D.  EMG 05/29/2023 Impression: Abnormal study. There is electrodiagnostic evidence of chronic, moderate right and mild left carpal tunnel syndrome.  Thank you for the referral of this patient.  It was our privilege to participate in care of your patient. Feel free to contact us with any further questions. _____________________________ Theora Master, MD   ASSESSMENT/PLAN:  Medley Mikkelsen is doing fair.  She has evidence of cervical radiculopathy and had improvement for short period of time on the diagnostic portion of her injection.  She does have concordant findings on her MRI scan.  She has seen physical therapy but was deemed to be an inappropriate candidate and was discharged.  At this point, I have recommended C6-7 anterior cervical discectomy and fusion.    We have previously reviewed the risks and benefits of this procedure.  This visit was performed via telephone.  Patient location: home Provider location: office  I spent a total of 5 minutes non-face-to-face activities for this visit on the date of this encounter including review of current clinical condition and response to treatment.  The patient is aware of and accepts the limits of this telehealth visit.   Venetia Night MD, [facility] Department of Neurosurgery

## 2023-08-07 NOTE — Telephone Encounter (Signed)
Surgery planned for 09/01/23

## 2023-08-10 ENCOUNTER — Telehealth: Payer: Self-pay

## 2023-08-10 NOTE — Telephone Encounter (Signed)
I spoke with Tamara Dodson to inform her that Dr Myer Haff has a scheduling conflict and we will need to move her surgery on 09/01/23. We have moved her surgery to 09/04/23.

## 2023-08-11 ENCOUNTER — Other Ambulatory Visit: Payer: Self-pay

## 2023-08-11 DIAGNOSIS — Z01818 Encounter for other preprocedural examination: Secondary | ICD-10-CM

## 2023-08-18 ENCOUNTER — Other Ambulatory Visit: Payer: Self-pay | Admitting: Family Medicine

## 2023-08-18 DIAGNOSIS — Z1231 Encounter for screening mammogram for malignant neoplasm of breast: Secondary | ICD-10-CM

## 2023-08-23 ENCOUNTER — Other Ambulatory Visit: Payer: Self-pay

## 2023-08-23 ENCOUNTER — Encounter: Payer: Self-pay | Admitting: Oncology

## 2023-08-23 ENCOUNTER — Encounter
Admission: RE | Admit: 2023-08-23 | Discharge: 2023-08-23 | Disposition: A | Discharge status: Home / Self Care | Payer: BC Managed Care – PPO | Source: Ambulatory Visit | Attending: Neurosurgery | Admitting: Neurosurgery

## 2023-08-23 VITALS — Ht 69.0 in | Wt 318.5 lb

## 2023-08-23 DIAGNOSIS — R768 Other specified abnormal immunological findings in serum: Secondary | ICD-10-CM | POA: Diagnosis not present

## 2023-08-23 DIAGNOSIS — I10 Essential (primary) hypertension: Secondary | ICD-10-CM | POA: Diagnosis not present

## 2023-08-23 DIAGNOSIS — K7469 Other cirrhosis of liver: Secondary | ICD-10-CM | POA: Insufficient documentation

## 2023-08-23 DIAGNOSIS — Z01818 Encounter for other preprocedural examination: Secondary | ICD-10-CM | POA: Diagnosis present

## 2023-08-23 DIAGNOSIS — Z01810 Encounter for preprocedural cardiovascular examination: Secondary | ICD-10-CM | POA: Diagnosis not present

## 2023-08-23 HISTORY — DX: Carpal tunnel syndrome, right upper limb: G56.01

## 2023-08-23 HISTORY — DX: Nonrheumatic mitral (valve) insufficiency: I34.0

## 2023-08-23 HISTORY — DX: Unspecified osteoarthritis, unspecified site: M19.90

## 2023-08-23 HISTORY — DX: Unspecified mononeuropathy of bilateral lower limbs: G57.93

## 2023-08-23 HISTORY — DX: Migraine, unspecified, not intractable, without status migrainosus: G43.909

## 2023-08-23 HISTORY — DX: Celiac disease: K90.0

## 2023-08-23 HISTORY — DX: Other specified abnormal immunological findings in serum: R76.8

## 2023-08-23 HISTORY — DX: Fatty (change of) liver, not elsewhere classified: K76.0

## 2023-08-23 HISTORY — DX: Radiculopathy, cervical region: M54.12

## 2023-08-23 HISTORY — DX: Other specified abnormal immunological findings in serum: R76.89

## 2023-08-23 HISTORY — DX: Gastro-esophageal reflux disease without esophagitis: K21.9

## 2023-08-23 HISTORY — DX: Lymphedema, not elsewhere classified: I89.0

## 2023-08-23 LAB — BASIC METABOLIC PANEL
Anion gap: 9 (ref 5–15)
BUN: 10 mg/dL (ref 8–23)
CO2: 24 mmol/L (ref 22–32)
Calcium: 8.7 mg/dL — ABNORMAL LOW (ref 8.9–10.3)
Chloride: 103 mmol/L (ref 98–111)
Creatinine, Ser: 0.56 mg/dL (ref 0.44–1.00)
GFR, Estimated: >60 mL/min (ref >60)
Glucose, Bld: 119 mg/dL — ABNORMAL HIGH (ref 70–99)
Potassium: 4 mmol/L (ref 3.5–5.1)
Sodium: 136 mmol/L (ref 135–145)

## 2023-08-23 LAB — CBC
HCT: 40 % (ref 36.0–46.0)
Hemoglobin: 13.2 g/dL (ref 12.0–15.0)
MCH: 32.1 pg (ref 26.0–34.0)
MCHC: 33 g/dL (ref 30.0–36.0)
MCV: 97.3 fL (ref 80.0–100.0)
Platelets: 173 10*3/uL (ref 150–400)
RBC: 4.11 MIL/uL (ref 3.87–5.11)
RDW: 12.8 % (ref 11.5–15.5)
WBC: 7 10*3/uL (ref 4.0–10.5)
nRBC: 0 % (ref 0.0–0.2)

## 2023-08-23 LAB — SURGICAL PCR SCREEN
MRSA, PCR: NEGATIVE
Staphylococcus aureus: POSITIVE — AB

## 2023-08-23 LAB — TYPE AND SCREEN
ABO/RH(D): O NEG
Antibody Screen: NEGATIVE

## 2023-08-23 NOTE — Patient Instructions (Addendum)
Your procedure is scheduled on: Monday September 16  Report to the Registration Desk on the 1st floor of the [facility] To find out your arrival time, please call [phone] between 1PM - 3PM on: Friday, September 13  If your arrival time is 6:00 am, do not arrive before that time as the [facility] entrance doors do not open until 6:00 am.  REMEMBER: Instructions that are not followed completely may result in serious medical risk, up to and including death; or upon the discretion of your surgeon and anesthesiologist your surgery may need to be rescheduled.  Do not eat food after midnight the night before surgery.  No gum chewing or hard candies.  You may however, drink CLEAR liquids up to 2 hours before you are scheduled to arrive for your surgery. Do not drink anything within 2 hours of your scheduled arrival time.  Clear liquids include: - water  - apple juice without pulp - gatorade (not RED colors) - black coffee or tea (Do NOT add milk or creamers to the coffee or tea) Do NOT drink anything that is not on this list.  One week prior to surgery: Stop Anti-inflammatories (NSAIDS) such as Advil, Aleve, Ibuprofen, Motrin, Naproxen, Naprosyn and Aspirin based products such as Excedrin, Goody's Powder, BC Powder. Stop ANY OVER THE COUNTER supplements until after surgery. Ascorbic Acid (VITAMIN C)    You may however, continue to take Tylenol if needed for pain up until the day of surgery.  Continue taking all prescribed medications with the exception of the following: etodolac (LODINE) stop 7 days prior to surgery, last dose Sunday September 8  furosemide (LASIX) do not take the day of surgery, last dose Sunday September 15  losartan (COZAAR do not take the day of surgery, last dose Sunday September 15  meclizine (ANTIVERT) do not take the day of surgery, last dose Sunday September 15    Follow recommendations from Cardiologist or PCP regarding stopping blood  thinners. aspirin EC stop 7 days prior to surgery, last dose Sunday September 8    TAKE ONLY THESE MEDICATIONS THE MORNING OF SURGERY WITH A SIP OF WATER:  gabapentin (NEURONTIN)  propranolol (INDERAL)    No Alcohol for 24 hours before or after surgery.  No Smoking including e-cigarettes for 24 hours before surgery.  No chewable tobacco products for at least 6 hours before surgery.  No nicotine patches on the day of surgery.  Do not use any "recreational" drugs for at least a week (preferably 2 weeks) before your surgery.  Please be advised that the combination of cocaine and anesthesia may have negative outcomes, up to and including death. If you test positive for cocaine, your surgery will be cancelled.  On the morning of surgery brush your teeth with toothpaste and water, you may rinse your mouth with mouthwash if you wish. Do not swallow any toothpaste or mouthwash.  Use CHG Soap as directed on instruction sheet.  Do not wear jewelry, make-up, hairpins, clips or nail polish.  Do not wear lotions, powders, or perfumes.   Do not shave body hair from the neck down 48 hours before surgery.  Contact lenses, hearing aids and dentures may not be worn into surgery.  Do not bring valuables to the hospital. [facility] is not responsible for any missing/lost belongings or valuables.   Notify your doctor if there is any change in your medical condition (cold, fever, infection).  Wear comfortable clothing (specific to your surgery type) to the hospital.  After surgery, you can help prevent lung complications by doing breathing exercises.  Take deep breaths and cough every 1-2 hours.   If you are being discharged the day of surgery, you will not be allowed to drive home. You will need a responsible individual to drive you home and stay with you for 24 hours after surgery.   If you are taking public transportation, you will need to have a responsible individual with you.  Please  call the Pre-admissions Testing Dept. at [phone] if you have any questions about these instructions.  Surgery Visitation Policy:  Patients having surgery or a procedure may have two visitors.  Children under the age of 23 must have an adult with them who is not the patient.            Pre-operative 5 CHG Bath Instructions   You can play a key role in reducing the risk of infection after surgery. Your skin needs to be as free of germs as possible. You can reduce the number of germs on your skin by washing with CHG (chlorhexidine gluconate) soap before surgery. CHG is an antiseptic soap that kills germs and continues to kill germs even after washing.   DO NOT use if you have an allergy to chlorhexidine/CHG or antibacterial soaps. If your skin becomes reddened or irritated, stop using the CHG and notify one of our RNs at [phone].   Please shower with the CHG soap starting 4 days before surgery using the following schedule:     Please keep in mind the following:  DO NOT shave, including legs and underarms, starting the day of your first shower.   You may shave your face at any point before/day of surgery.  Place clean sheets on your bed the day you start using CHG soap. Use a clean washcloth (not used since being washed) for each shower. DO NOT sleep with pets once you start using the CHG.   CHG Shower Instructions:  If you choose to wash your hair and private area, wash first with your normal shampoo/soap.  After you use shampoo/soap, rinse your hair and body thoroughly to remove shampoo/soap residue.  Turn the water OFF and apply about 3 tablespoons (45 ml) of CHG soap to a CLEAN washcloth.  Apply CHG soap ONLY FROM YOUR NECK DOWN TO YOUR TOES (washing for 3-5 minutes)  DO NOT use CHG soap on face, private areas, open wounds, or sores.  Pay special attention to the area where your surgery is being performed.  If you are having back surgery, having someone wash your  back for you may be helpful. Wait 2 minutes after CHG soap is applied, then you may rinse off the CHG soap.  Pat dry with a clean towel  Put on clean clothes/pajamas   If you choose to wear lotion, please use ONLY the CHG-compatible lotions on the back of this paper.     Additional instructions for the day of surgery: DO NOT APPLY any lotions, deodorants, cologne, or perfumes.   Put on clean/comfortable clothes.  Brush your teeth.  Ask your nurse before applying any prescription medications to the skin.      CHG Compatible Lotions   Aveeno Moisturizing lotion  Cetaphil Moisturizing Cream  Cetaphil Moisturizing Lotion  Clairol Herbal Essence Moisturizing Lotion, Dry Skin  Clairol Herbal Essence Moisturizing Lotion, Extra Dry Skin  Clairol Herbal Essence Moisturizing Lotion, Normal Skin  Curel Age Defying Therapeutic Moisturizing Lotion with Alpha Hydroxy  Curel Extreme Care  Body Lotion  Curel Soothing Hands Moisturizing Hand Lotion  Curel Therapeutic Moisturizing Cream, Fragrance-Free  Curel Therapeutic Moisturizing Lotion, Fragrance-Free  Curel Therapeutic Moisturizing Lotion, Original Formula  Eucerin Daily Replenishing Lotion  Eucerin Dry Skin Therapy Plus Alpha Hydroxy Crme  Eucerin Dry Skin Therapy Plus Alpha Hydroxy Lotion  Eucerin Original Crme  Eucerin Original Lotion  Eucerin Plus Crme Eucerin Plus Lotion  Eucerin TriLipid Replenishing Lotion  Keri Anti-Bacterial Hand Lotion  Keri Deep Conditioning Original Lotion Dry Skin Formula Softly Scented  Keri Deep Conditioning Original Lotion, Fragrance Free Sensitive Skin Formula  Keri Lotion Fast Absorbing Fragrance Free Sensitive Skin Formula  Keri Lotion Fast Absorbing Softly Scented Dry Skin Formula  Keri Original Lotion  Keri Skin Renewal Lotion Keri Silky Smooth Lotion  Keri Silky Smooth Sensitive Skin Lotion  Nivea Body Creamy Conditioning Oil  Nivea Body Extra Enriched Chief Financial Officer Moisturizing Lotion Nivea Crme  Nivea Skin Firming Lotion  NutraDerm 30 Skin Lotion  NutraDerm Skin Lotion  NutraDerm Therapeutic Skin Cream  NutraDerm Therapeutic Skin Lotion  ProShield Protective Hand Cream  Provon moisturizing lotion

## 2023-08-29 ENCOUNTER — Ambulatory Visit
Admission: RE | Admit: 2023-08-29 | Discharge: 2023-08-29 | Disposition: A | Discharge status: Home / Self Care | Payer: BC Managed Care – PPO | Source: Ambulatory Visit | Attending: Family Medicine | Admitting: Family Medicine

## 2023-08-29 DIAGNOSIS — Z1231 Encounter for screening mammogram for malignant neoplasm of breast: Secondary | ICD-10-CM | POA: Diagnosis present

## 2023-09-03 MED ORDER — FAMOTIDINE 20 MG PO TABS
20.0000 mg | ORAL_TABLET | Freq: Once | ORAL | Status: AC
  Administered 2023-09-04: 20 mg via ORAL

## 2023-09-03 MED ORDER — CEFAZOLIN SODIUM-DEXTROSE 2-4 GM/100ML-% IV SOLN: 2.0000 g | INTRAVENOUS | Status: DC

## 2023-09-03 MED ORDER — CHLORHEXIDINE GLUCONATE 0.12 % MT SOLN
15.0000 mL | Freq: Once | OROMUCOSAL | Status: AC
  Administered 2023-09-04: 15 mL via OROMUCOSAL

## 2023-09-03 MED ORDER — ORAL CARE MOUTH RINSE: 15.0000 mL | Freq: Once | OROMUCOSAL | Status: AC

## 2023-09-03 MED ORDER — CEFAZOLIN IN SODIUM CHLORIDE 3-0.9 GM/100ML-% IV SOLN
3.0000 g | INTRAVENOUS | Status: AC
  Administered 2023-09-04: 3 g via INTRAVENOUS
  Filled 2023-09-03: qty 100

## 2023-09-03 MED ORDER — LACTATED RINGERS IV SOLN: INTRAVENOUS | Status: DC

## 2023-09-03 MED ORDER — CEFAZOLIN IN SODIUM CHLORIDE 2-0.9 GM/100ML-% IV SOLN
3.0000 g | Freq: Once | INTRAVENOUS | Status: DC
  Filled 2023-09-03: qty 200

## 2023-09-04 ENCOUNTER — Encounter: Payer: Self-pay | Admitting: Neurosurgery

## 2023-09-04 ENCOUNTER — Other Ambulatory Visit: Payer: Self-pay | Admitting: Neurosurgery

## 2023-09-04 ENCOUNTER — Other Ambulatory Visit: Payer: Self-pay | Admitting: Family Medicine

## 2023-09-04 ENCOUNTER — Ambulatory Visit: Payer: BC Managed Care – PPO | Admitting: Anesthesiology

## 2023-09-04 ENCOUNTER — Other Ambulatory Visit: Payer: Self-pay

## 2023-09-04 ENCOUNTER — Ambulatory Visit: Payer: BC Managed Care – PPO

## 2023-09-04 ENCOUNTER — Ambulatory Visit
Admission: RE | Admit: 2023-09-04 | Discharge: 2023-09-04 | Disposition: A | Discharge status: Home / Self Care | Payer: BC Managed Care – PPO | Attending: Neurosurgery | Admitting: Neurosurgery

## 2023-09-04 ENCOUNTER — Encounter
Admission: RE | Disposition: A | Discharge status: Home / Self Care | Payer: Self-pay | Source: Home / Self Care | Attending: Neurosurgery

## 2023-09-04 DIAGNOSIS — M5412 Radiculopathy, cervical region: Secondary | ICD-10-CM

## 2023-09-04 DIAGNOSIS — M50123 Cervical disc disorder at C6-C7 level with radiculopathy: Secondary | ICD-10-CM | POA: Diagnosis present

## 2023-09-04 DIAGNOSIS — Z981 Arthrodesis status: Secondary | ICD-10-CM | POA: Insufficient documentation

## 2023-09-04 DIAGNOSIS — M2578 Osteophyte, vertebrae: Secondary | ICD-10-CM | POA: Diagnosis not present

## 2023-09-04 DIAGNOSIS — Z01818 Encounter for other preprocedural examination: Secondary | ICD-10-CM

## 2023-09-04 HISTORY — PX: ANTERIOR CERVICAL DECOMP/DISCECTOMY FUSION: SHX1161

## 2023-09-04 SURGERY — ANTERIOR CERVICAL DECOMPRESSION/DISCECTOMY FUSION 1 LEVEL
Anesthesia: General

## 2023-09-04 MED ORDER — PHENYLEPHRINE 80 MCG/ML (10ML) SYRINGE FOR IV PUSH (FOR BLOOD PRESSURE SUPPORT)
PREFILLED_SYRINGE | INTRAVENOUS | Status: DC | PRN
  Administered 2023-09-04: 160 ug via INTRAVENOUS

## 2023-09-04 MED ORDER — FENTANYL CITRATE (PF) 100 MCG/2ML IJ SOLN
INTRAMUSCULAR | Status: AC
  Filled 2023-09-04: qty 2

## 2023-09-04 MED ORDER — DEXAMETHASONE SODIUM PHOSPHATE 10 MG/ML IJ SOLN
INTRAMUSCULAR | Status: DC | PRN
  Administered 2023-09-04: 10 mg via INTRAVENOUS

## 2023-09-04 MED ORDER — REMIFENTANIL HCL 1 MG IV SOLR
INTRAVENOUS | Status: DC | PRN
Start: 2023-09-04 — End: 2023-09-04
  Administered 2023-09-04: .15 ug/kg/min via INTRAVENOUS

## 2023-09-04 MED ORDER — SENNA 8.6 MG PO TABS: 1.0000 | ORAL_TABLET | Freq: Every day | ORAL | 0 refills | Status: DC | PRN

## 2023-09-04 MED ORDER — FAMOTIDINE 20 MG PO TABS
ORAL_TABLET | ORAL | Status: AC
  Filled 2023-09-04: qty 1

## 2023-09-04 MED ORDER — METHOCARBAMOL 750 MG PO TABS
ORAL_TABLET | ORAL | Status: AC
  Filled 2023-09-04: qty 1

## 2023-09-04 MED ORDER — FENTANYL CITRATE (PF) 100 MCG/2ML IJ SOLN
INTRAMUSCULAR | Status: DC | PRN
  Administered 2023-09-04 (×2): 50 ug via INTRAVENOUS

## 2023-09-04 MED ORDER — PHENYLEPHRINE HCL-NACL 20-0.9 MG/250ML-% IV SOLN
INTRAVENOUS | Status: DC | PRN
Start: 2023-09-04 — End: 2023-09-04
  Administered 2023-09-04: 15 ug/min via INTRAVENOUS

## 2023-09-04 MED ORDER — 0.9 % SODIUM CHLORIDE (POUR BTL) OPTIME
TOPICAL | Status: DC | PRN
  Administered 2023-09-04: 500 mL

## 2023-09-04 MED ORDER — LIDOCAINE HCL (CARDIAC) PF 100 MG/5ML IV SOSY
PREFILLED_SYRINGE | INTRAVENOUS | Status: DC | PRN
  Administered 2023-09-04: 100 mg via INTRAVENOUS

## 2023-09-04 MED ORDER — PROPOFOL 10 MG/ML IV BOLUS
INTRAVENOUS | Status: AC
  Filled 2023-09-04: qty 20

## 2023-09-04 MED ORDER — REMIFENTANIL HCL 1 MG IV SOLR
INTRAVENOUS | Status: AC
  Filled 2023-09-04: qty 1000

## 2023-09-04 MED ORDER — OXYCODONE HCL 5 MG PO TABS
ORAL_TABLET | ORAL | Status: AC
  Filled 2023-09-04: qty 1

## 2023-09-04 MED ORDER — FENTANYL CITRATE (PF) 100 MCG/2ML IJ SOLN
25.0000 ug | INTRAMUSCULAR | Status: DC | PRN
  Administered 2023-09-04: 25 ug via INTRAVENOUS
  Administered 2023-09-04: 50 ug via INTRAVENOUS
  Administered 2023-09-04: 25 ug via INTRAVENOUS

## 2023-09-04 MED ORDER — OXYCODONE HCL 5 MG/5ML PO SOLN: 5.0000 mg | Freq: Once | ORAL | Status: AC | PRN

## 2023-09-04 MED ORDER — CHLORHEXIDINE GLUCONATE 0.12 % MT SOLN
OROMUCOSAL | Status: AC
  Filled 2023-09-04: qty 15

## 2023-09-04 MED ORDER — METHOCARBAMOL 750 MG PO TABS
750.0000 mg | ORAL_TABLET | Freq: Once | ORAL | Status: AC
  Administered 2023-09-04: 750 mg via ORAL

## 2023-09-04 MED ORDER — SURGIFLO WITH THROMBIN (HEMOSTATIC MATRIX KIT) OPTIME
TOPICAL | Status: DC | PRN
  Administered 2023-09-04: 1 via TOPICAL

## 2023-09-04 MED ORDER — BUPIVACAINE-EPINEPHRINE (PF) 0.5% -1:200000 IJ SOLN
INTRAMUSCULAR | Status: DC | PRN
  Administered 2023-09-04: 10 mL via PERINEURAL

## 2023-09-04 MED ORDER — BUPIVACAINE-EPINEPHRINE (PF) 0.5% -1:200000 IJ SOLN
INTRAMUSCULAR | Status: AC
  Filled 2023-09-04: qty 10

## 2023-09-04 MED ORDER — GLYCOPYRROLATE 0.2 MG/ML IJ SOLN
INTRAMUSCULAR | Status: DC | PRN
  Administered 2023-09-04: .2 mg via INTRAVENOUS

## 2023-09-04 MED ORDER — ONDANSETRON HCL 4 MG/2ML IJ SOLN
INTRAMUSCULAR | Status: DC | PRN
  Administered 2023-09-04 (×2): 4 mg via INTRAVENOUS

## 2023-09-04 MED ORDER — OXYCODONE HCL 5 MG PO TABS
5.0000 mg | ORAL_TABLET | Freq: Once | ORAL | Status: AC | PRN
  Administered 2023-09-04: 5 mg via ORAL

## 2023-09-04 MED ORDER — LABETALOL HCL 5 MG/ML IV SOLN
INTRAVENOUS | Status: DC | PRN
Start: 2023-09-04 — End: 2023-09-04
  Administered 2023-09-04: 5 mg via INTRAVENOUS

## 2023-09-04 MED ORDER — SUCCINYLCHOLINE CHLORIDE 200 MG/10ML IV SOSY
PREFILLED_SYRINGE | INTRAVENOUS | Status: DC | PRN
  Administered 2023-09-04: 100 mg via INTRAVENOUS

## 2023-09-04 MED ORDER — OXYCODONE HCL 5 MG PO TABS
5.0000 mg | ORAL_TABLET | ORAL | 0 refills | Status: AC | PRN
Start: 2023-09-04 — End: 2023-09-09

## 2023-09-04 MED ORDER — MIDAZOLAM HCL 2 MG/2ML IJ SOLN
INTRAMUSCULAR | Status: DC | PRN
  Administered 2023-09-04: 2 mg via INTRAVENOUS

## 2023-09-04 MED ORDER — PROPOFOL 10 MG/ML IV BOLUS
INTRAVENOUS | Status: DC | PRN
  Administered 2023-09-04: 200 mg via INTRAVENOUS

## 2023-09-04 MED ORDER — METHOCARBAMOL 750 MG PO TABS: 750.0000 mg | ORAL_TABLET | Freq: Four times a day (QID) | ORAL | 0 refills | Status: DC

## 2023-09-04 MED ORDER — PROPOFOL 1000 MG/100ML IV EMUL
INTRAVENOUS | Status: AC
  Filled 2023-09-04: qty 100

## 2023-09-04 MED ORDER — MIDAZOLAM HCL 2 MG/2ML IJ SOLN
INTRAMUSCULAR | Status: AC
  Filled 2023-09-04: qty 2

## 2023-09-04 SURGICAL SUPPLY — 51 items
ADH SKN CLS APL DERMABOND .7 (GAUZE/BANDAGES/DRESSINGS) ×1
AGENT HMST KT MTR STRL THRMB (HEMOSTASIS) ×1
ALLOGRAFT BONE FIBER KORE 1CC (Bone Implant) IMPLANT
BASIN KIT SINGLE STR (MISCELLANEOUS) ×1 IMPLANT
BULB RESERV EVAC DRAIN JP 100C (MISCELLANEOUS) IMPLANT
BUR NEURO DRILL SOFT 3.0X3.8M (BURR) ×1 IMPLANT
DERMABOND ADVANCED .7 DNX12 (GAUZE/BANDAGES/DRESSINGS) ×1 IMPLANT
DRAIN CHANNEL JP 10F RND 20C F (MISCELLANEOUS) IMPLANT
DRAPE C ARM PK CFD 31 SPINE (DRAPES) ×1 IMPLANT
DRAPE LAPAROTOMY 77X122 PED (DRAPES) ×1 IMPLANT
DRAPE MICROSCOPE SPINE 48X150 (DRAPES) ×1 IMPLANT
ELECT REM PT RETURN 9FT ADLT (ELECTROSURGICAL) ×1
ELECTRODE REM PT RTRN 9FT ADLT (ELECTROSURGICAL) ×1 IMPLANT
FEE INTRAOP CADWELL SUPPLY NCS (MISCELLANEOUS) IMPLANT
FEE INTRAOP MONITOR IMPULS NCS (MISCELLANEOUS) IMPLANT
GLOVE BIOGEL PI IND STRL 6.5 (GLOVE) ×1 IMPLANT
GLOVE SURG SYN 6.5 ES PF (GLOVE) ×1 IMPLANT
GLOVE SURG SYN 6.5 PF PI (GLOVE) ×1 IMPLANT
GLOVE SURG SYN 8.5 E (GLOVE) ×3 IMPLANT
GLOVE SURG SYN 8.5 PF PI (GLOVE) ×3 IMPLANT
GOWN SRG LRG LVL 4 IMPRV REINF (GOWNS) ×1 IMPLANT
GOWN SRG XL LVL 3 NONREINFORCE (GOWNS) ×1 IMPLANT
GOWN STRL NON-REIN TWL XL LVL3 (GOWNS) ×1
GOWN STRL REIN LRG LVL4 (GOWNS) ×1
INTRAOP CADWELL SUPPLY FEE NCS (MISCELLANEOUS)
INTRAOP DISP SUPPLY FEE NCS (MISCELLANEOUS)
INTRAOP MONITOR FEE IMPULS NCS (MISCELLANEOUS)
KIT TURNOVER KIT A (KITS) ×1 IMPLANT
MANIFOLD NEPTUNE II (INSTRUMENTS) ×1 IMPLANT
NDL SAFETY ECLIP 18X1.5 (MISCELLANEOUS) ×1 IMPLANT
NS IRRIG 1000ML POUR BTL (IV SOLUTION) ×1 IMPLANT
NS IRRIG 500ML POUR BTL (IV SOLUTION) IMPLANT
PACK LAMINECTOMY ARMC (PACKS) ×1 IMPLANT
PAD ARMBOARD 7.5X6 YLW CONV (MISCELLANEOUS) ×2 IMPLANT
PIN CASPAR 14 (PIN) ×1 IMPLANT
PIN CASPAR 14MM (PIN) ×1
PLATE ACP 1.6V PLATE 34 2L (Plate) IMPLANT
SCREW ACP 3.5X17 S/D VARIA (Screw) IMPLANT
SCREW ACP VA ST 4.0 X 17 (Screw) IMPLANT
SPACER C HEDRON 12X14 7M 7D (Spacer) IMPLANT
SPONGE KITTNER 5P (MISCELLANEOUS) ×1 IMPLANT
STAPLER SKIN PROX 35W (STAPLE) IMPLANT
SURGIFLO W/THROMBIN 8M KIT (HEMOSTASIS) ×1 IMPLANT
SUT DVC VLOC 3-0 CL 6 P-12 (SUTURE) IMPLANT
SUT VIC AB 3-0 SH 8-18 (SUTURE) IMPLANT
SUT VICRYL 3-0 CR8 SH (SUTURE) ×1 IMPLANT
SYR 20ML LL LF (SYRINGE) ×1 IMPLANT
TAPE CLOTH 3X10 WHT NS LF (GAUZE/BANDAGES/DRESSINGS) ×3 IMPLANT
TRAP FLUID SMOKE EVACUATOR (MISCELLANEOUS) ×1 IMPLANT
TRAY FOLEY SLVR 16FR LF STAT (SET/KITS/TRAYS/PACK) IMPLANT
WATER STERILE IRR 1000ML POUR (IV SOLUTION) ×2 IMPLANT

## 2023-09-04 NOTE — Anesthesia Procedure Notes (Addendum)
Procedure Name: Intubation Date/Time: 09/04/2023 7:25 AM  Performed by: Mohammed Kindle, CRNAPre-anesthesia Checklist: Patient identified, Emergency Drugs available, Suction available and Patient being monitored Patient Re-evaluated:Patient Re-evaluated prior to induction Oxygen Delivery Method: Circle system utilized Preoxygenation: Pre-oxygenation with 100% oxygen Induction Type: IV induction and Cricoid Pressure applied Ventilation: Mask ventilation without difficulty Laryngoscope Size: McGraph and 3 Grade View: Grade II Tube type: Oral Tube size: 7.0 mm Number of attempts: 1 Airway Equipment and Method: Stylet and Oral airway Placement Confirmation: ETT inserted through vocal cords under direct vision, positive ETCO2, breath sounds checked- equal and bilateral and CO2 detector Secured at: 21 cm Tube secured with: Tape Dental Injury: Teeth and Oropharynx as per pre-operative assessment  Difficulty Due To: Difficulty was anticipated, Difficult Airway- due to limited oral opening and Difficult Airway- due to reduced neck mobility Future Recommendations: Recommend- induction with short-acting agent, and alternative techniques readily available

## 2023-09-04 NOTE — Progress Notes (Signed)
   DOS: 01/26/22 (C5-6 ACDF)  HISTORY OF PRESENT ILLNESS: 09/04/2023 Ms. Tamara Dodson presents with continued symptoms of arm pain.  08/01/2023 Ms. Tamara Dodson had her injection and had a short period of relief.  Her pain is then returned and similar to what it was when I last saw her.  06/20/2023 She went to physical therapy for her neck and arm, but was discharged due to being an inappropriate candidate.  She still has pain in her left shoulder and down her arm.  She is having some numbness worse in her left hand than the right.  04/20/2023 Ms. Tamara Dodson continues to have some pain in the left side of her neck that extends towards her arm.  She has pain around her left shoulder.  She also has some numbness intermittently in her left hand.  She has difficulty with turning her neck.  She had both of her shoulders operated on last year.  When I last saw her, she was actually doing pretty well.  This is impacting her on a daily basis.  10/20/2022 Ms. Adean Tamara Dodson is status post ACDF.  Unfortunate, she has had 2 rotator cuff repairs this year.  She still having some pain from that.  Overall, she is doing well.  She does have some heaviness in her neck and head at the end of the day.  She is taking Tylenol for that.  PHYSICAL EXAMINATION:   Vitals:   09/04/23 0619  BP: (!) 151/80  Pulse: (!) 107  Resp: 16  Temp: (!) 96.8 F (36 C)  SpO2: 99%   Heart sounds normal no MRG. Chest Clear to Auscultation Bilaterally.  MAEW 5/5   IMAGING: No significant complication.  There is some settling at C5-6. MRI C spine 03/01/2023  IMPRESSION: 1. 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 Moderate left foraminal stenosis. Mild spinal stenosis. 2. Anterior cervical fusion from C3 through C6. At C4-5 there is moderate-severe left foraminal stenosis. At C5-6 there is moderate bilateral foraminal stenosis. 3. No acute osseous injury of the cervical  spine.     Electronically Signed   By: Elige Ko M.D.  EMG 05/29/2023 Impression: Abnormal study. There is electrodiagnostic evidence of chronic, moderate right and mild left carpal tunnel syndrome.  Thank you for the referral of this patient. It was our privilege to participate in care of your patient. Feel free to contact us with any further questions. _____________________________ Theora Master, MD   ASSESSMENT/PLAN:  Lorretta Tamara Dodson is doing fair.  She has evidence of cervical radiculopathy and had improvement for short period of time on the diagnostic portion of her injection.  She does have concordant findings on her MRI scan.  She has seen physical therapy but was deemed to be an inappropriate candidate and was discharged.  We will proceed with C6-7 anterior cervical discectomy and fusion.     Venetia Night MD, [facility] Department of Neurosurgery

## 2023-09-04 NOTE — Discharge Instructions (Addendum)
Your surgeon has performed an operation on your cervical spine (neck) to relieve pressure on the spinal cord and/or nerves. This involved making an incision in the front of your neck and removing one or more of the discs that support your spine. Next, a small piece of bone, a titanium plate, and screws were used to fuse two or more of the vertebrae (bones) together.  The following are instructions to help in your recovery once you have been discharged from the hospital. Even if you feel well, it is important that you follow these activity guidelines. If you do not let your neck heal properly from the surgery, you can increase the chance of return of your symptoms and other complications.  * Do not take anti-inflammatory medications for 3 months after surgery (naproxen [Aleve], ibuprofen [Advil, Motrin], etc.). These medications can prevent your bones from healing properly. *Resume home Aspirin per pre-op instructions  Celebrex, if prescribed, is ok to take.  Activity    No bending, lifting, or twisting ("BLT"). Avoid lifting objects heavier than 10 pounds (gallon milk jug).  Where possible, avoid household activities that involve lifting, bending, reaching, pushing, or pulling such as laundry, vacuuming, grocery shopping, and childcare. Try to arrange for help from friends and family for these activities while your back heals.  Increase physical activity slowly as tolerated.  Taking short walks is encouraged, but avoid strenuous exercise. Do not jog, run, bicycle, lift weights, or participate in any other exercises unless specifically allowed by your doctor.  Talk to your doctor before resuming sexual activity.  You should not drive until cleared by your doctor.  Until released by your doctor, you should not return to work or school.  You should rest at home and let your body heal.   You may shower three days after your surgery.  After showering, lightly dab your incision dry. Do not take a  tub bath or go swimming until approved by your doctor at your follow-up appointment.  If your doctor ordered a cervical collar (neck brace) for you, you should wear it whenever you are out of bed. You may remove it when lying down or sleeping, but you should wear it at all other times. Not all neck surgeries require a cervical collar.  If you smoke, we strongly recommend that you quit.  Smoking has been proven to interfere with normal bone healing and will dramatically reduce the success rate of your surgery. Please contact [facility] (800-QUIT-NOW) and use the resources at www.[facility].com for assistance in stopping smoking.  Surgical Incision   If you have a dressing on your incision, you may remove it two days after your surgery. Keep your incision area clean and dry.  If you have staples or stitches on your incision, you should have a follow up scheduled for removal. If you do not have staples or stitches, you will have steri-strips (small pieces of surgical tape) or Dermabond glue. The steri-strips/glue should begin to peel away within about a week (it is fine if the steri-strips fall off before then). If the strips are still in place one week after your surgery, you may gently remove them.  Diet           You may return to your usual diet. However, you may experience discomfort when swallowing in the first month after your surgery. This is normal. You may find that softer foods are more comfortable for you to swallow. Be sure to stay hydrated.  When to Contact us  You may experience pain in your neck and/or pain between your shoulder blades. This is normal and should improve in the next few weeks with the help of pain medication, muscle relaxers, and rest. Some patients report that a warm compress on the back of the neck or between the shoulder blades helps.  However, should you experience any of the following, contact us immediately: New numbness or weakness Pain that is progressively  getting worse, and is not relieved by your pain medication, muscle relaxers, rest, and warm compresses Bleeding, redness, swelling, pain, or drainage from surgical incision Chills or flu-like symptoms Fever greater than 101.0 F (38.3 C) Inability to eat, drink fluids, or take medications Problems with bowel or bladder functions Difficulty breathing or shortness of breath Warmth, tenderness, or swelling in your calf Contact Information How to contact us:  If you have any questions/concerns before or after surgery, you can reach [place] at [phone], or you can send a mychart message. We can be reached by phone or mychart 8am-4pm, Monday-Friday.  *Please note: Calls after 4pm are forwarded to a third party answering service. Mychart messages are not routinely monitored during evenings, weekends, and holidays. Please call our office to contact the answering service for urgent concerns during non-business hours.    AMBULATORY SURGERY  DISCHARGE INSTRUCTIONS   The drugs that you were given will stay in your system until tomorrow so for the next 24 hours you should not:  Drive an automobile Make any legal decisions Drink any alcoholic beverage   You may resume regular meals tomorrow.  Today it is better to start with liquids and gradually work up to solid foods.  You may eat anything you prefer, but it is better to start with liquids, then soup and crackers, and gradually work up to solid foods.   Please notify your doctor immediately if you have any unusual bleeding, trouble breathing, redness and pain at the surgery site, drainage, fever, or pain not relieved by medication.    Additional Instructions:  Please contact your physician with any problems or Same Day Surgery at [phone], Monday through Friday 6 am to 4 pm, or [place] at [facility] number at [phone].

## 2023-09-04 NOTE — Transfer of Care (Signed)
Immediate Anesthesia Transfer of Care Note  Patient: Tamara Dodson  Procedure(s) Performed: C6-7 ANTERIOR CERVICAL DISCECTOMY AND FUSION (HEDRON)  Patient Location: PACU  Anesthesia Type:General  Level of Consciousness: awake, drowsy, and patient cooperative  Airway & Oxygen Therapy: Patient Spontanous Breathing and Patient connected to face mask oxygen  Post-op Assessment: Report given to RN and Post -op Vital signs reviewed and stable  Post vital signs: Reviewed and stable  Last Vitals:  Vitals Value Taken Time  BP 168/88 09/04/23 0915  Temp 36.4 C 09/04/23 0911  Pulse 105 09/04/23 0917  Resp 18 09/04/23 0917  SpO2 99 % 09/04/23 0917  Vitals shown include unfiled device data.  Last Pain:  Vitals:   09/04/23 0619  TempSrc: Temporal  PainSc: 0-No pain         Complications:  Encounter Notable Events  Notable Event Outcome Phase Comment  Difficult to intubate - expected  Intraprocedure Filed from anesthesia note documentation.

## 2023-09-04 NOTE — Op Note (Signed)
Indications: Tamara Dodson is a 62 y.o. female with M54.12 cervical radiculopathy. She failed conservative management prompting surgical intervention.  Findings: stenosis, successful decompression  Preoperative Diagnosis: M54.12 cervical radiculopathy  Postoperative Diagnosis: same   EBL: 20 ml IVF: see anesthesia record Drains: none Disposition: Extubated and Stable to PACU Complications: none  No foley catheter was placed.   Preoperative Note:    Risks of surgery discussed include: infection, bleeding, stroke, coma, death, paralysis, CSF leak, nerve/spinal cord injury, numbness, tingling, weakness, complex regional pain syndrome, recurrent stenosis and/or disc herniation, vascular injury, development of instability, neck/back pain, need for further surgery, persistent symptoms, development of deformity, and the risks of anesthesia. The patient understood these risks and agreed to proceed.  Operative Note:   Procedure:  1) Anterior cervical diskectomy and fusion at C6-7 2) Anterior cervical instrumentation at C5-7 3) Insertion of biomechanical device at C6-7   Procedure: After obtaining informed consent, the patient taken to the operating room, placed in supine position, general anesthesia induced.  The patient had a small shoulder roll placed behind their shoulders.  The patient received preop antibiotics and IV Decadron.  The patient had a neck incision outlined, was prepped and draped in usual sterile fashion. The incision was injected with local anesthetic.   An incision was opened, dissection taken down medial to the carotid artery and jugular vein, lateral to the trachea and esophagus.  The prevertebral fascia was identified, and the prior C5-6 plate was identified and exposed.  The left C6 screw was removed for placement of a kaspar pin.  The longus colli were dissected laterally, and self-retaining retractors placed to open the operative field. The microscope was then brought  into the field.  With this complete, distractor pins were placed in the vertebral bodies of C6 and C7. The distractor was placed, and the annulus at C6/7 was opened using a bovie.  Curettes and pituitary rongeurs used to remove the majority of disk, then the drill was used to remove the posterior osteophyte, expose the posterior longitudinal ligament, and begin the foraminotomies. The nerve hook was used to elevate the posterior longitudinal ligament, which was then removed with Kerrison rongeurs to complete decompression of the spinal cord. The Kerrison rongeurs were then used to complete the foraminotomies bilaterally to decompress the nerve roots. The nerve hook could be passed out each foramen, ensuring decompression of the nerve roots. Meticulous hemostasis was obtained. A biomechanical device (Globus Hedron C 7 mm height x 14 mm width by 12 mm depth) was placed at C6/7. The device had been filled with demineralized bone matrix for aid in arthrodesis.  Please note that the procedure included removal of the disc, removal of the posterior osteophytes, and removal of the posterior longitudinal ligament to ensure decompression of the spinal cord.  Additionally, foraminotomies were performed on both sides of the spinal canal to decompress the nerve roots.  The prior plate did not leave room for plating, so it was removed and a 3 segment plate was used from C5-7.  The caspar distractor was removed, and bone wax used for hemostasis. A separate, 34 mm 3 segment Nuvasive ACP plate was chosen to bridge.  Two screws placed in each vertebral body, respectively making sure the screws were behind the locking mechanism.  Final AP and lateral radiographs were taken.   Please note that the plate is not inclusive to the biomechanical device.  The anchoring mechanism of the plate is completely separate from the biomechanical device.  With  everything in good position, the wound was irrigated copiously and meticulous  hemostasis obtained.  Wound was closed in 2 layers using interrupted inverted 3-0 Vicryl sutures.  The wound was dressed with dermabond, the head of bed at 30 degrees, taken to recovery room in stable condition.  No new postop neurological deficits were identified.  Sponge and pattie counts were correct at the end of the procedure.   I performed the entire procedure with the assistance of Manning Charity PA as an Designer, television/film set. An assistant was required for this procedure due to the complexity.  The assistant provided assistance in tissue manipulation and suction, and was required for the successful and safe performance of the procedure. I performed the critical portions of the procedure.   Venetia Night MD

## 2023-09-04 NOTE — Anesthesia Postprocedure Evaluation (Signed)
Anesthesia Post Note  Patient: LATIERA PATTI  Procedure(s) Performed: C6-7 ANTERIOR CERVICAL DISCECTOMY AND FUSION (HEDRON)  Patient location during evaluation: PACU Anesthesia Type: General Level of consciousness: awake and alert Pain management: pain level controlled Vital Signs Assessment: post-procedure vital signs reviewed and stable Respiratory status: spontaneous breathing, nonlabored ventilation, respiratory function stable and patient connected to nasal cannula oxygen Cardiovascular status: blood pressure returned to baseline and stable Postop Assessment: no apparent nausea or vomiting Anesthetic complications: yes  Encounter Notable Events  Notable Event Outcome Phase Comment  Difficult to intubate - expected  Intraprocedure Filed from anesthesia note documentation.     Last Vitals:  Vitals:   09/04/23 1000 09/04/23 1026  BP: (!) 169/74 (!) 146/86  Pulse: (!) 104 (!) 110  Resp: 16 16  Temp:  37.1 C  SpO2: 94% 92%    Last Pain:  Vitals:   09/04/23 1026  TempSrc: Temporal  PainSc: 8                  Stephanie Coup

## 2023-09-04 NOTE — Anesthesia Preprocedure Evaluation (Signed)
Anesthesia Evaluation  Patient identified by MRN, date of birth, ID band Patient awake    Reviewed: Allergy & Precautions, NPO status , Patient's Chart, lab work & pertinent test results  History of Anesthesia Complications Negative for: history of anesthetic complications  Airway Mallampati: III  TM Distance: >3 FB Neck ROM: limited    Dental  (+) Chipped, Missing, Poor Dentition   Pulmonary neg pulmonary ROS, neg shortness of breath   Pulmonary exam normal        Cardiovascular Exercise Tolerance: Good hypertension, (-) angina Normal cardiovascular exam     Neuro/Psych  Headaches  Neuromuscular disease  negative psych ROS   GI/Hepatic Neg liver ROS,GERD  Controlled,,  Endo/Other  negative endocrine ROS    Renal/GU negative Renal ROS  negative genitourinary   Musculoskeletal   Abdominal   Peds  Hematology negative hematology ROS (+)   Anesthesia Other Findings Past Medical History: No date: Anemia No date: Arthritis     Comment:  osteoarthrosis No date: Cirrhosis (HCC)     Comment:  seeing Dr Cathie Hoops No date: Erythrocytosis No date: Fatty liver No date: Gluten intolerance No date: Headache     Comment:  migraines No date: Hypertension 07/18/2019: Iron overload No date: Lumbago No date: Lymph edema     Comment:  lower extremities No date: Morbid obesity (HCC) No date: Neuromuscular disorder (HCC)     Comment:  neuropathy - both feet No date: Vertigo  Past Surgical History: 02/24/2021: ANTERIOR CERVICAL DECOMP/DISCECTOMY FUSION; N/A     Comment:  Procedure: ANTERIOR CERVICAL DECOMPRESSION/DISCECTOMY               FUSION 2 LEVELS C3-5;  Surgeon: Venetia Night, MD;                Location: [facility] ORS;  Service: Neurosurgery;  Laterality:               N/A; 01/26/2022: ANTERIOR CERVICAL DECOMP/DISCECTOMY FUSION; N/A     Comment:  Procedure: C5-6 ANTERIOR CERVICAL DISCECTOMY & FUSION                (GLOBUS HEDRON);  Surgeon: Venetia Night, MD;                Location: [facility] OR[facility];  Service: Neurosurgery;  Laterality:               N/A; No date: CHOLECYSTECTOMY No date: COLONOSCOPY WITH ESOPHAGOGASTRODUODENOSCOPY (EGD) 01/25/2021: ESOPHAGOGASTRODUODENOSCOPY; N/A     Comment:  Procedure: ESOPHAGOGASTRODUODENOSCOPY (EGD);  Surgeon:               [place], Boykin Nearing, MD;  Location: [facility] END[facility];                Service: Gastroenterology;  Laterality: N/A;  C-19 test               on 01/22/2021 Requesting to be the last case due to her               transportation No date: JOINT REPLACEMENT; Left     Comment:  knee, hip No date: JOINT REPLACEMENT; Bilateral     Comment:  knee 09/21/2015: KNEE ARTHROPLASTY; Right     Comment:  Procedure: COMPUTER ASSISTED TOTAL KNEE ARTHROPLASTY;                Surgeon: Donato Heinz, MD;  Location: [facility] OR[facility];                Service: Orthopedics;  Laterality: Right; 04/11/2022: SHOULDER ARTHROSCOPY; Left     Comment:  Procedure: Left shoulder arthroscopic regeneten patch,               biceps tenodesis, subacromial decompression;  Surgeon:               Signa Kell, MD;  Location: [facility] ORS;  Service:               Orthopedics;  Laterality: Left; 08/11/2022: SHOULDER ARTHROSCOPY WITH OPEN ROTATOR CUFF REPAIR; Left     Comment:  Procedure: Left revision shoulder arthroscopic rotator               cuff repair and extensive glenohumeral debridement;                Surgeon: Signa Kell, MD;  Location: [facility]               [facility];  Service: Orthopedics;  Laterality: Left;  needs               potassium draw No date: TONSILLECTOMY 11/17/2017: TOTAL HIP ARTHROPLASTY; Right     Comment:  Procedure: TOTAL HIP ARTHROPLASTY;  Surgeon: Donato Heinz, MD;  Location: [facility] ORS;  Service: Orthopedics;               Laterality: Right; No date: WISDOM TOOTH EXTRACTION     Reproductive/Obstetrics negative OB ROS                              Anesthesia Physical Anesthesia Plan  ASA: 3  Anesthesia Plan: General ETT and General   Post-op Pain Management:    Induction: Intravenous  PONV Risk Score and Plan: 3 and Ondansetron, Dexamethasone and Midazolam  Airway Management Planned: Oral ETT  Additional Equipment:   Intra-op Plan:   Post-operative Plan: Extubation in OR  Informed Consent: I have reviewed the patients History and Physical, chart, labs and discussed the procedure including the risks, benefits and alternatives for the proposed anesthesia with the patient or authorized representative who has indicated his/her understanding and acceptance.     Dental Advisory Given  Plan Discussed with: Anesthesiologist, CRNA and Surgeon  Anesthesia Plan Comments: (Patient consented for risks of anesthesia including but not limited to:  - adverse reactions to medications - damage to eyes, teeth, lips or other oral mucosa - nerve damage due to positioning  - sore throat or hoarseness - Damage to heart, brain, nerves, lungs, other parts of body or loss of life  Patient voiced understanding.)       Anesthesia Quick Evaluation

## 2023-09-04 NOTE — H&P (Signed)
Please see note titled "progress note" from today for h and p prior to surgery.

## 2023-09-04 NOTE — Discharge Summary (Signed)
Discharge Summary  Patient ID: [name] MRN: [id number] DOB/AGE: 06/27/1961 62 y.o.  Admit date: 09/04/2023 Discharge date: 09/04/2023  Admission Diagnoses: M54.12 cervical radiculopathy   Discharge Diagnoses:  Active Problems:   Cervical radiculopathy   Discharged Condition: good  Hospital Course:  Tamara Dodson is a 62 y.o presenting with cervical radiculopathy s/p C6-7 ACDF. Her intraoperative course was uncomplicated. She was monitoring in PACU and discharged home after ambulating, urinating, and tolerating PO intake. She was given prescriptions for Oxycodone, Robaxin, and Senna   Consults: None  Significant Diagnostic Studies: none  Treatments: surgery: as above. Please see separately dictated operative report for further details  Discharge Exam: Blood pressure (!) 151/80, pulse (!) 107, temperature (!) 96.8 F (36 C), temperature source Temporal, resp. rate 16, height 5' 9.5" (1.765 m), weight (!) 141.5 kg, SpO2 99%. CN II-XII grossly intact 5/5 throughout Incision c/d/i  Disposition: Discharge disposition: 01-Home or Self Care        Allergies as of 09/04/2023       Reactions   Gluten Meal Diarrhea   bloating        Medication List     STOP taking these medications    aspirin EC 81 MG tablet       TAKE these medications    acetaminophen 500 MG tablet Commonly known as: TYLENOL Take 500 mg by mouth every 6 (six) hours as needed for moderate pain.   azelastine 0.1 % nasal spray Commonly known as: ASTELIN Place 1 spray into both nostrils 2 (two) times daily. Use in each nostril as directed   COLD AND FLU PO Take by mouth.   etodolac 500 MG tablet Commonly known as: LODINE Take 1 tablet by mouth 2 (two) times daily.   fluticasone 50 MCG/ACT nasal spray Commonly known as: FLONASE Place 2 sprays into both nostrils daily.   furosemide 20 MG tablet Commonly known as: LASIX Take 40 mg by mouth in the morning.   gabapentin 400 MG  capsule Commonly known as: NEURONTIN Take 400 mg by mouth 3 (three) times daily.   Galcanezumab-gnlm 120 MG/ML Soaj Inject into the skin every 30 (thirty) days.   guaiFENesin 600 MG 12 hr tablet Commonly known as: MUCINEX Take 1,200 mg by mouth every morning.   losartan 50 MG tablet Commonly known as: COZAAR Take 1 tablet by mouth every morning.   meclizine 12.5 MG tablet Commonly known as: ANTIVERT Take 12.5 mg by mouth 3 (three) times daily.   methocarbamol 750 MG tablet Commonly known as: Robaxin-750 Take 1 tablet (750 mg total) by mouth 4 (four) times daily.   nortriptyline 50 MG capsule Commonly known as: PAMELOR Take 50 mg by mouth at bedtime.   propranolol 60 MG tablet Commonly known as: INDERAL Take 60 mg by mouth 2 (two) times daily.   senna 8.6 MG Tabs tablet Commonly known as: SENOKOT Take 1 tablet (8.6 mg total) by mouth daily as needed for mild constipation.   vitamin C 1000 MG tablet Take 1,000 mg by mouth daily.         Signed: Susanne Borders 09/04/2023, 9:04 AM

## 2023-09-14 ENCOUNTER — Encounter: Payer: BC Managed Care – PPO | Admitting: Orthopedic Surgery

## 2023-09-15 NOTE — Progress Notes (Signed)
   REFERRING PHYSICIAN:  [name], Md [address] [place],  [place] [address]  DOS: 09/04/23  ACDF C6-C7  HISTORY OF PRESENT ILLNESS: Tamara Dodson is 2 weeks status post above surgery. Given oxycodone and robaxin on discharge from the hospital.   Stop lodine***   PHYSICAL EXAMINATION:  NEUROLOGICAL:  General: In no acute distress.   Awake, alert, oriented to person, place, and time.  Pupils equal round and reactive to light.  Facial tone is symmetric.    Strength: Side Biceps Triceps Deltoid Interossei Grip Wrist Ext. Wrist Flex.  R 5 5 5 5 5 5 5   L 5 5 5 5 5 5 5    Incision c/d/i  Imaging:  Nothing new to review.   Assessment / Plan: Tamara Dodson is doing well s/p above surgery. Treatment options reviewed with patient and following plan made:   - We discussed activity escalation and I have advised the patient to lift up to 10 pounds until 6 weeks after surgery (until follow up with Dr. Myer Haff).   - Reviewed wound care.  - Continue current medications including *** - Follow up as scheduled in 4 weeks and prn.   Advised to contact the office if any questions or concerns arise.   Drake Leach PA-C Dept of Neurosurgery

## 2023-09-19 ENCOUNTER — Encounter: Payer: Self-pay | Admitting: Orthopedic Surgery

## 2023-09-19 ENCOUNTER — Ambulatory Visit (INDEPENDENT_AMBULATORY_CARE_PROVIDER_SITE_OTHER): Payer: BC Managed Care – PPO | Admitting: Orthopedic Surgery

## 2023-09-19 VITALS — BP 134/80 | Ht 69.5 in | Wt 312.0 lb

## 2023-09-19 DIAGNOSIS — Z09 Encounter for follow-up examination after completed treatment for conditions other than malignant neoplasm: Secondary | ICD-10-CM

## 2023-09-19 DIAGNOSIS — M5412 Radiculopathy, cervical region: Secondary | ICD-10-CM

## 2023-09-19 DIAGNOSIS — Z981 Arthrodesis status: Secondary | ICD-10-CM

## 2023-09-29 ENCOUNTER — Inpatient Hospital Stay: Payer: BC Managed Care – PPO | Attending: Oncology

## 2023-09-29 DIAGNOSIS — R161 Splenomegaly, not elsewhere classified: Secondary | ICD-10-CM | POA: Diagnosis not present

## 2023-09-29 DIAGNOSIS — K7400 Hepatic fibrosis, unspecified: Secondary | ICD-10-CM | POA: Insufficient documentation

## 2023-09-29 DIAGNOSIS — R7689 Other specified abnormal immunological findings in serum: Secondary | ICD-10-CM

## 2023-09-29 DIAGNOSIS — D751 Secondary polycythemia: Secondary | ICD-10-CM | POA: Diagnosis present

## 2023-09-29 DIAGNOSIS — R768 Other specified abnormal immunological findings in serum: Secondary | ICD-10-CM

## 2023-09-29 LAB — IRON AND TIBC
Iron: 75 ug/dL (ref 28–170)
Saturation Ratios: 21 % (ref 10.4–31.8)
TIBC: 351 ug/dL (ref 250–450)
UIBC: 276 ug/dL

## 2023-09-29 LAB — CBC WITH DIFFERENTIAL/PLATELET
Abs Immature Granulocytes: 0.04 10*3/uL (ref 0.00–0.07)
Basophils Absolute: 0.1 10*3/uL (ref 0.0–0.1)
Basophils Relative: 1 %
Eosinophils Absolute: 0.2 10*3/uL (ref 0.0–0.5)
Eosinophils Relative: 2 %
HCT: 43.9 % (ref 36.0–46.0)
Hemoglobin: 14.7 g/dL (ref 12.0–15.0)
Immature Granulocytes: 1 %
Lymphocytes Relative: 13 %
Lymphs Abs: 1 10*3/uL (ref 0.7–4.0)
MCH: 31.7 pg (ref 26.0–34.0)
MCHC: 33.5 g/dL (ref 30.0–36.0)
MCV: 94.8 fL (ref 80.0–100.0)
Monocytes Absolute: 0.7 10*3/uL (ref 0.1–1.0)
Monocytes Relative: 9 %
Neutro Abs: 6.1 10*3/uL (ref 1.7–7.7)
Neutrophils Relative %: 74 %
Platelets: 204 10*3/uL (ref 150–400)
RBC: 4.63 MIL/uL (ref 3.87–5.11)
RDW: 12.6 % (ref 11.5–15.5)
WBC: 8 10*3/uL (ref 4.0–10.5)
nRBC: 0 % (ref 0.0–0.2)

## 2023-09-29 LAB — HEPATIC FUNCTION PANEL
ALT: 29 U/L (ref 0–44)
AST: 54 U/L — ABNORMAL HIGH (ref 15–41)
Albumin: 4 g/dL (ref 3.5–5.0)
Alkaline Phosphatase: 99 U/L (ref 38–126)
Bilirubin, Direct: 0.2 mg/dL (ref 0.0–0.2)
Indirect Bilirubin: 0.8 mg/dL (ref 0.3–0.9)
Total Bilirubin: 1 mg/dL (ref 0.3–1.2)
Total Protein: 8.3 g/dL — ABNORMAL HIGH (ref 6.5–8.1)

## 2023-09-29 LAB — FERRITIN: Ferritin: 229 ng/mL (ref 11–307)

## 2023-10-01 ENCOUNTER — Other Ambulatory Visit: Payer: Self-pay | Admitting: Neurosurgery

## 2023-10-03 ENCOUNTER — Encounter: Payer: Self-pay | Admitting: Oncology

## 2023-10-03 ENCOUNTER — Ambulatory Visit: Payer: BC Managed Care – PPO | Admitting: Internal Medicine

## 2023-10-03 ENCOUNTER — Inpatient Hospital Stay (HOSPITAL_BASED_OUTPATIENT_CLINIC_OR_DEPARTMENT_OTHER): Payer: BC Managed Care – PPO | Admitting: Oncology

## 2023-10-03 VITALS — BP 118/63 | HR 73 | Temp 98.1°F | Resp 18 | Wt 310.7 lb

## 2023-10-03 DIAGNOSIS — R768 Other specified abnormal immunological findings in serum: Secondary | ICD-10-CM | POA: Diagnosis not present

## 2023-10-03 DIAGNOSIS — Z148 Genetic carrier of other disease: Secondary | ICD-10-CM | POA: Diagnosis not present

## 2023-10-03 DIAGNOSIS — R7989 Other specified abnormal findings of blood chemistry: Secondary | ICD-10-CM | POA: Diagnosis not present

## 2023-10-03 DIAGNOSIS — R7689 Other specified abnormal immunological findings in serum: Secondary | ICD-10-CM

## 2023-10-03 DIAGNOSIS — D751 Secondary polycythemia: Secondary | ICD-10-CM | POA: Diagnosis not present

## 2023-10-03 NOTE — Assessment & Plan Note (Signed)
Lab result were reviewed. Low IgG Repeat immunoglobulin level, if persistently low, will refer to immunology

## 2023-10-03 NOTE — Assessment & Plan Note (Addendum)
Heterozygous H53D mutation  MRI liver showed no iron overload. Labs are reviewed and discussed with patient. Lab Results  Component Value Date   HGB 14.7 09/29/2023   TIBC 351 09/29/2023   IRONPCTSAT 21 09/29/2023   FERRITIN 229 09/29/2023     Elevated ferritin, normalized.

## 2023-10-03 NOTE — Assessment & Plan Note (Addendum)
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 The iron stain highlights moderate staining in sinusoidal and periportal parenchymal patterns of distribution. Recommend patient to follow up with GI I will repeat US RUQ

## 2023-10-03 NOTE — Progress Notes (Signed)
Pt here for follow up. No new concerns voiced.   

## 2023-10-03 NOTE — Progress Notes (Signed)
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 Labs are reviewed and discussed with patient. Lab Results  Component Value Date   HGB 14.7 09/29/2023   TIBC 351 09/29/2023   IRONPCTSAT 21 09/29/2023   FERRITIN 229 09/29/2023     Elevated ferritin, normalized.  No need for phlebotomy.   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 The iron stain highlights moderate staining in sinusoidal and periportal parenchymal patterns of distribution. Recommend patient to follow up with GI  Orders Placed This Encounter  Procedures   US ABDOMEN LIMITED RUQ (LIVER/GB)    Standing Status:   Future    Standing Expiration Date:   10/02/2024    Order Specific Question:   Reason for Exam (SYMPTOM  OR DIAGNOSIS REQUIRED)    Answer:   Abnormal LFT    Order Specific Question:   Preferred imaging location?    Answer:   [facility]   CBC with Differential (Cancer Center Only)    Standing Status:   Future    Standing Expiration Date:   10/02/2024   Hepatic function panel    Standing Status:   Future    Standing Expiration Date:   10/02/2024   Iron and TIBC    Standing Status:   Future    Standing Expiration Date:   10/02/2024   Follow up 1 year All questions were answered. The patient knows to call the clinic with any problems, questions or concerns.  Rickard Patience, MD, PhD [facility] Hematology Oncology 10/03/2023   REASON FOR VISIT:  Follow-up for  hemochromatosis carrier, erythrocytosis  HISTORY OF PRESENTING ILLNESS:  Tamara Dodson is a 62 y.o. 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  Chronic Onset, duration since 2016.  She does not smoke.  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   # Heterozygous H63D mutation.  She has a history of iron deficiency after hip replacement and she took oral iron supplementation which was discontinued.  09/24/2019 liver biopsy showed moderate parenchymal and sinusoidal siderosis, patient heterozygous for H63D gene.  Septal fibrosis with areas suggestive of early bridging fibrosis [at least stage II fibrosis]. Focal and minimal steatosis with no evidence of steatohepatitis.  Total NAFLD score 1 of 8. --Scattered iron granules are identified on the HE sections. The iron stain highlights moderate staining in sinusoidal and periportal parenchymal patterns of distribution  Lost follow up with me after her visit in September 2020 and restablished care 03/17/2021 per recommendation of gastroenterology and the neurosurgeon Dr. Marcell Barlow  Elevated Ferritin in the context of liver fibrosis- phlebotomy  02/24/2021 s/p ACDF C3-5  07/06/22 MRI abdomen with and without contrast  1.  Normal liver morphology. No gross evidence of hepatic iron deposition. Please see addendum for full iron quantification results. 2.  Mild splenomegaly.  0.4 mg Fe / g dry weight of liver (Normal <1.8 mg / g)   Intermittent neuropathy of feet and mouth, migraine and vertigo  INTERVAL HISTORY Tamara Dodson is a 62 y.o. female who has above history reviewed by me today presents for follow up visit for management of heterozygous hemochromatosis mutation.  Liver fibrosis During the interval, patient  has had cervical myelopathy surgeries, left shoulder surgeries. She was previously recommend to establish with GI and prefers to defer due to above surgeries.     Review of Systems  Constitutional:  Positive for fatigue. Negative for appetite change, chills and fever.  HENT:   Negative for hearing loss and voice change.   Eyes:  Negative for eye problems.  Respiratory:  Negative for chest tightness and cough.   Cardiovascular:  Negative for chest pain.  Gastrointestinal:  Negative for abdominal distention, abdominal pain and blood in stool.  Endocrine: Negative for hot flashes.  Genitourinary:  Negative for difficulty urinating and frequency.   Musculoskeletal:  Positive for arthralgias.  Skin:  Negative for itching and rash.  Neurological:  Negative for extremity weakness.  Hematological:  Negative for adenopathy.  Psychiatric/Behavioral:  Negative for confusion.     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    Lumbago    Lymphedema of lower extremity    Migraines    Mitral valve insufficiency    Morbid obesity (HCC)    Neuropathy of both feet    Nonalcoholic fatty liver disease    Osteoarthritis    Radiculopathy of cervical region    Vertigo     SURGICAL HISTORY: Past Surgical History:  Procedure Laterality Date   ANTERIOR CERVICAL DECOMP/DISCECTOMY FUSION N/A 02/24/2021   Procedure: ANTERIOR CERVICAL DECOMPRESSION/DISCECTOMY FUSION 2 LEVELS C3-5;  Surgeon: Venetia Night, MD;  Location: [facility];  Service: Neurosurgery;  Laterality: N/A;   ANTERIOR CERVICAL DECOMP/DISCECTOMY FUSION N/A 01/26/2022   Procedure: C5-6 ANTERIOR CERVICAL  DISCECTOMY & FUSION (GLOBUS HEDRON);  Surgeon: Venetia Night, MD;  Location: [facility];  Service: Neurosurgery;  Laterality: N/A;   ANTERIOR CERVICAL DECOMP/DISCECTOMY FUSION N/A 09/04/2023   Procedure: C6-7 ANTERIOR CERVICAL DISCECTOMY AND FUSION (HEDRON);  Surgeon: Venetia Night, MD;  Location: [facility];  Service: Neurosurgery;  Laterality: N/A;   APPENDECTOMY     CHOLECYSTECTOMY     COLONOSCOPY N/A 03/22/2023   Procedure: COLONOSCOPY;  Surgeon: Toledo, Boykin Nearing, MD;  Location: [facility];  Service: Gastroenterology;  Laterality: N/A;   COLONOSCOPY WITH ESOPHAGOGASTRODUODENOSCOPY (EGD)     ESOPHAGOGASTRODUODENOSCOPY N/A 01/25/2021   Procedure: ESOPHAGOGASTRODUODENOSCOPY (EGD);  Surgeon: Toledo, Boykin Nearing, MD;  Location: [facility];  Service: Gastroenterology;  Laterality: N/A;  C-19 test on 01/22/2021 Requesting to be the last case due to her transportation   ESOPHAGOGASTRODUODENOSCOPY N/A 03/22/2023   Procedure: ESOPHAGOGASTRODUODENOSCOPY (EGD);  Surgeon: Toledo, Boykin Nearing, MD;  Location: [facility];  Service: Gastroenterology;  Laterality: N/A;   JOINT REPLACEMENT Left    knee, hip   JOINT REPLACEMENT Bilateral    knee   KNEE ARTHROPLASTY Right 09/21/2015   Procedure: COMPUTER ASSISTED  TOTAL KNEE ARTHROPLASTY;  Surgeon: Donato Heinz, MD;  Location: [facility];  Service: Orthopedics;  Laterality: Right;   SHOULDER ARTHROSCOPY Left 04/11/2022   Procedure: Left shoulder arthroscopic regeneten patch, biceps tenodesis, subacromial decompression;  Surgeon: Signa Kell, MD;  Location: [facility];  Service: Orthopedics;  Laterality: Left;   SHOULDER ARTHROSCOPY WITH OPEN ROTATOR CUFF REPAIR Left 08/11/2022   Procedure: Left revision shoulder arthroscopic rotator cuff repair and extensive glenohumeral debridement;  Surgeon: Signa Kell, MD;  Location: [facility];  Service: Orthopedics;  Laterality: Left;  needs potassium draw   TONSILLECTOMY     TOTAL HIP ARTHROPLASTY  Right 11/17/2017   Procedure: TOTAL HIP ARTHROPLASTY;  Surgeon: Donato Heinz, MD;  Location: [facility];  Service: Orthopedics;  Laterality: Right;   WISDOM TOOTH EXTRACTION      SOCIAL HISTORY: Social History   Socioeconomic History   Marital status: Married    Spouse name: Casimiro Needle    Number of children: 3   Years of education: Not on file   Highest education level: Not on file  Occupational History   Not on file  Tobacco Use   Smoking status: Never    Passive exposure: Never   Smokeless tobacco: Never  Vaping Use   Vaping status: Never Used  Substance and Sexual Activity   Alcohol use: No   Drug use: No   Sexual activity: Yes  Other Topics Concern   Not on file  Social History Narrative   Not on file   Social Determinants of Health   Financial Resource Strain: Low Risk  (10/19/2022)   Received from [facility], [facility]   Overall Financial Resource Strain (CARDIA)    Difficulty of Paying Living Expenses: Not very hard  Food Insecurity: No Food Insecurity (10/19/2022)   Received from [facility], [facility]   Hunger Vital Sign    Worried About Running Out of Food in the Last Year: Never true    Ran Out of Food in the Last Year: Never true  Transportation Needs: No Transportation Needs (10/19/2022)   Received from [facility], [facility]   [facility] - Transportation    In the past 12 months, has lack of transportation kept you from medical appointments or from getting medications?: No    Lack of Transportation (Non-Medical): No  Physical Activity: Not on file  Stress: Not on file  Social Connections: Unknown (09/24/2019)   Social Connection and Isolation Panel [NHANES]    Frequency of Communication with Friends and Family: More than three times a week    Frequency of Social Gatherings with Friends and Family: Not on file    Attends Religious Services: Not on  file    Active Member of Clubs or Organizations: Not on file    Attends Banker Meetings: Not on file    Marital Status: Not on file  Intimate Partner Violence: Not At Risk (09/24/2019)   Humiliation, Afraid, Rape, and Kick questionnaire    Fear of Current or Ex-Partner: No    Emotionally Abused: No    Physically Abused: No    Sexually Abused: No    FAMILY HISTORY: Family History  Problem Relation Age of Onset   Breast cancer Mother        55's    ALLERGIES:  is allergic to gluten meal.  MEDICATIONS:  Current Outpatient Medications  Medication Sig Dispense Refill   acetaminophen (TYLENOL) 500 MG  tablet Take 500 mg by mouth every 6 (six) hours as needed for moderate pain.     Ascorbic Acid (VITAMIN C) 1000 MG tablet Take 1,000 mg by mouth daily.     azelastine (ASTELIN) 0.1 % nasal spray Place 1 spray into both nostrils 2 (two) times daily. Use in each nostril as directed 30 mL 5   etodolac (LODINE) 500 MG tablet Take 1 tablet by mouth 2 (two) times daily.     fluticasone (FLONASE) 50 MCG/ACT nasal spray Place 2 sprays into both nostrils daily. 16 g 5   furosemide (LASIX) 20 MG tablet Take 40 mg by mouth in the morning.     gabapentin (NEURONTIN) 400 MG capsule Take 400 mg by mouth 3 (three) times daily.     Galcanezumab-gnlm 120 MG/ML SOAJ Inject into the skin every 30 (thirty) days.     guaiFENesin (MUCINEX) 600 MG 12 hr tablet Take 1,200 mg by mouth every morning.     losartan (COZAAR) 50 MG tablet Take 1 tablet by mouth every morning.     meclizine (ANTIVERT) 12.5 MG tablet Take 12.5 mg by mouth 3 (three) times daily.     methocarbamol (ROBAXIN) 750 MG tablet Take 1 tablet by mouth 4 times daily 120 tablet 0   nortriptyline (PAMELOR) 50 MG capsule Take 50 mg by mouth at bedtime.     propranolol (INDERAL) 60 MG tablet Take 60 mg by mouth 2 (two) times daily.     senna (SENOKOT) 8.6 MG TABS tablet Take 1 tablet (8.6 mg total) by mouth daily as needed for mild  constipation. 30 tablet 0   Nutritional Supplements (COLD AND FLU PO) Take by mouth. (Patient not taking: Reported on 10/03/2023)     No current facility-administered medications for this visit.     PHYSICAL EXAMINATION: ECOG PERFORMANCE STATUS: 1 - Symptomatic but completely ambulatory Vitals:   10/03/23 1340  BP: 118/63  Pulse: 73  Resp: 18  Temp: 98.1 F (36.7 C)   Filed Weights   10/03/23 1340  Weight: (!) 310 lb 11.2 oz (140.9 kg)    Physical Exam Constitutional:      General: She is not in acute distress.    Appearance: She is obese.  HENT:     Head: Normocephalic and atraumatic.  Eyes:     General: No scleral icterus.    Pupils: Pupils are equal, round, and reactive to light.  Cardiovascular:     Rate and Rhythm: Normal rate and regular rhythm.     Heart sounds: Normal heart sounds.  Pulmonary:     Effort: Pulmonary effort is normal. No respiratory distress.     Breath sounds: No wheezing.  Abdominal:     General: Bowel sounds are normal. There is no distension.     Palpations: Abdomen is soft.     Tenderness: There is no abdominal tenderness.  Musculoskeletal:        General: No deformity. Normal range of motion.     Cervical back: Normal range of motion and neck supple.  Skin:    General: Skin is warm and dry.  Neurological:     Mental Status: She is alert and oriented to person, place, and time.     Cranial Nerves: No cranial nerve deficit.     Coordination: Coordination normal.  Psychiatric:        Mood and Affect: Mood normal.     RADIOGRAPHIC STUDIES: I have personally reviewed the radiological images as listed and agreed with  the findings in the report. DG Cervical Spine 2-3 Views  Result Date: 09/04/2023 CLINICAL DATA:  Cervical fusion EXAM: CERVICAL SPINE - 2-3 VIEW COMPARISON:  02/14/2023 FINDINGS: A series of fluoroscopic spot images documents revision of the cervical fusion hardware from C5 now extending to the C7 vertebral body with  fusion cage placement in the C6-7 interspace. Stable anterior fusion hardware C3-5. IMPRESSION: C5-C7 cervical fusion revision. Electronically Signed   By: Corlis Leak M.D.   On: 09/04/2023 10:00   DG C-Arm 1-60 Min-No Report  Result Date: 09/04/2023 Fluoroscopy was utilized by the requesting physician.  No radiographic interpretation.   DG C-Arm 1-60 Min-No Report  Result Date: 09/04/2023 Fluoroscopy was utilized by the requesting physician.  No radiographic interpretation.   MM 3D SCREENING MAMMOGRAM BILATERAL BREAST  Result Date: 08/31/2023 CLINICAL DATA:  Screening. EXAM: DIGITAL SCREENING BILATERAL MAMMOGRAM WITH TOMOSYNTHESIS AND CAD TECHNIQUE: Bilateral screening digital craniocaudal and mediolateral oblique mammograms were obtained. Bilateral screening digital breast tomosynthesis was performed. The images were evaluated with computer-aided detection. COMPARISON:  Previous exam(s). ACR Breast Density Category b: There are scattered areas of fibroglandular density. FINDINGS: There are no findings suspicious for malignancy. IMPRESSION: No mammographic evidence of malignancy. A result letter of this screening mammogram will be mailed directly to the patient. RECOMMENDATION: Screening mammogram in one year. (Code:SM-B-01Y) BI-RADS CATEGORY  1: Negative. Electronically Signed   By: Amie Portland M.D.   On: 08/31/2023 08:30     LABORATORY DATA:  I have reviewed the data as listed    Latest Ref Rng & Units 09/29/2023    1:33 PM 08/23/2023   11:04 AM 09/26/2022    1:14 PM  CBC  WBC 4.0 - 10.5 K/uL 8.0  7.0  7.7   Hemoglobin 12.0 - 15.0 g/dL 16.1  09.6  04.5   Hematocrit 36.0 - 46.0 % 43.9  40.0  45.7   Platelets 150 - 400 K/uL 204  173  202       Latest Ref Rng & Units 09/29/2023    1:33 PM 08/23/2023   11:04 AM 09/26/2022    1:14 PM  CMP  Glucose 70 - 99 mg/dL  409    BUN 8 - 23 mg/dL  10    Creatinine 8.11 - 1.00 mg/dL  9.14    Sodium 782 - 956 mmol/L  136    Potassium 3.5 - 5.1  mmol/L  4.0    Chloride 98 - 111 mmol/L  103    CO2 22 - 32 mmol/L  24    Calcium 8.9 - 10.3 mg/dL  8.7    Total Protein 6.5 - 8.1 g/dL 8.3   8.1   Total Bilirubin 0.3 - 1.2 mg/dL 1.0   0.9   Alkaline Phos 38 - 126 U/L 99   70   AST 15 - 41 U/L 54   35   ALT 0 - 44 U/L 29   18    Iron/TIBC/Ferritin/ %Sat    Component Value Date/Time   IRON 75 09/29/2023 1333   TIBC 351 09/29/2023 1333   FERRITIN 229 09/29/2023 1333   IRONPCTSAT 21 09/29/2023 1333

## 2023-10-10 ENCOUNTER — Other Ambulatory Visit: Payer: Self-pay

## 2023-10-10 ENCOUNTER — Ambulatory Visit
Admission: RE | Admit: 2023-10-10 | Discharge: 2023-10-10 | Disposition: A | Discharge status: Home / Self Care | Payer: BC Managed Care – PPO | Attending: Neurosurgery | Admitting: Neurosurgery

## 2023-10-10 ENCOUNTER — Encounter: Payer: Self-pay | Admitting: Neurosurgery

## 2023-10-10 ENCOUNTER — Ambulatory Visit
Admission: RE | Admit: 2023-10-10 | Discharge: 2023-10-10 | Disposition: A | Discharge status: Home / Self Care | Payer: BC Managed Care – PPO | Source: Ambulatory Visit | Attending: Neurosurgery | Admitting: Neurosurgery

## 2023-10-10 ENCOUNTER — Ambulatory Visit (INDEPENDENT_AMBULATORY_CARE_PROVIDER_SITE_OTHER): Payer: BC Managed Care – PPO | Admitting: Neurosurgery

## 2023-10-10 VITALS — BP 122/78 | Ht 69.0 in | Wt 310.0 lb

## 2023-10-10 DIAGNOSIS — Z981 Arthrodesis status: Secondary | ICD-10-CM

## 2023-10-10 DIAGNOSIS — M5412 Radiculopathy, cervical region: Secondary | ICD-10-CM | POA: Insufficient documentation

## 2023-10-10 NOTE — Progress Notes (Signed)
   REFERRING PHYSICIAN:  [name], Md [address] [place],  [place] [address]  DOS: 09/04/23  ACDF C6-C7  HISTORY OF PRESENT ILLNESS: Tamara Dodson is status post above surgery. Given oxycodone and robaxin on discharge from the hospital.   She is doing well.  Her numbness is improving.  She is does still have some numbness in her 3rd through 5th finger of her left hand on the palmar aspect.  It does not go above her wrist.   PHYSICAL EXAMINATION:  NEUROLOGICAL:  General: In no acute distress.   Awake, alert, oriented to person, place, and time.  Pupils equal round and reactive to light.  Facial tone is symmetric.    Strength: Side Biceps Triceps Deltoid Interossei Grip Wrist Ext. Wrist Flex.  R 5 5 5 5 5 5 5   L 5 5 5 5 5 5 5    Incision c/d/i  Imaging:  No complications noted   Assessment / Plan: Tamara Dodson is doing well s/p above surgery.  I have given her exercises for her neck.  We discussed workup for her hand numbness, but will currently watch.  She will return in 6 weeks.  At that time, we will consider nerve conduction study or evaluation by my colleague Dr. Katrinka Blazing.    Venetia Night MD Dept of Neurosurgery

## 2023-10-11 NOTE — Progress Notes (Unsigned)
MRN : [id number]  Tamara Dodson is a 62 y.o. (March 13, 1961) female who presents with chief complaint of legs swell.  History of Present Illness:   The patient returns to the office for followup evaluation regarding leg swelling.  The swelling has improved quite a bit and the pain associated with swelling has decreased substantially. There have not been any interval development of a ulcerations or wounds.  Since the previous visit the patient has been wearing graduated compression stockings and has noted some improvement in the lymphedema. The patient has been using compression routinely morning until night.  The patient also states elevation during the day and exercise (such as walking) is being done too.   She continues to use her lymphedema pump.  The patient denies problems with the pump  Interval health care changes: She is status post a ACDF C6-C7 (Anterior cervical discectomy and fusion) on September 04, 2023 by Dr. Myer Haff.  She notes it was a very extensive surgery and her hands are getting better although she states over the last few days her left hand has begun to have some symptoms.  She continues to follow with Dr. Marcell Barlow.  Today her biggest complaint seems to be the neuropathy in her feet which she feels has been getting worse.  We did discuss both the capsaicin cream as well as the possibility of an implantable stimulator.  She is seeing her neurologist in 2 weeks we will discuss this with her.   No outpatient medications have been marked as taking for the 10/12/23 encounter (Appointment) with Gilda Crease, Latina Craver, MD.    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    Lumbago    Lymphedema of lower extremity    Migraines    Mitral valve insufficiency    Morbid obesity (HCC)    Neuropathy of both feet    Nonalcoholic fatty liver disease    Osteoarthritis    Radiculopathy of cervical region    Vertigo     Past Surgical History:  Procedure Laterality Date   ANTERIOR CERVICAL DECOMP/DISCECTOMY FUSION N/A 02/24/2021   Procedure: ANTERIOR CERVICAL DECOMPRESSION/DISCECTOMY FUSION 2 LEVELS C3-5;  Surgeon: Venetia Night, MD;  Location: [facility];  Service: Neurosurgery;  Laterality: N/A;   ANTERIOR CERVICAL DECOMP/DISCECTOMY FUSION N/A 01/26/2022   Procedure: C5-6 ANTERIOR CERVICAL DISCECTOMY & FUSION (GLOBUS HEDRON);  Surgeon: Venetia Night, MD;  Location: [facility];  Service: Neurosurgery;  Laterality: N/A;   ANTERIOR CERVICAL DECOMP/DISCECTOMY FUSION N/A 09/04/2023   Procedure: C6-7 ANTERIOR CERVICAL DISCECTOMY AND FUSION (HEDRON);  Surgeon: Venetia Night, MD;  Location: [facility];  Service: Neurosurgery;  Laterality: N/A;   APPENDECTOMY     CHOLECYSTECTOMY     COLONOSCOPY N/A 03/22/2023   Procedure: COLONOSCOPY;  Surgeon: Toledo, Boykin Nearing, MD;  Location: [facility];  Service: Gastroenterology;  Laterality: N/A;   COLONOSCOPY WITH ESOPHAGOGASTRODUODENOSCOPY (EGD)     ESOPHAGOGASTRODUODENOSCOPY N/A 01/25/2021   Procedure:  ESOPHAGOGASTRODUODENOSCOPY (EGD);  Surgeon: Toledo, Boykin Nearing, MD;  Location: [facility];  Service: Gastroenterology;  Laterality: N/A;  C-19 test on 01/22/2021 Requesting to be the last case due to her transportation   ESOPHAGOGASTRODUODENOSCOPY N/A 03/22/2023   Procedure: ESOPHAGOGASTRODUODENOSCOPY (EGD);  Surgeon: Toledo, Boykin Nearing, MD;  Location: [facility];  Service: Gastroenterology;  Laterality: N/A;   JOINT REPLACEMENT Left    knee, hip   JOINT REPLACEMENT Bilateral    knee   KNEE ARTHROPLASTY Right 09/21/2015   Procedure: COMPUTER ASSISTED TOTAL KNEE ARTHROPLASTY;   Surgeon: Donato Heinz, MD;  Location: [facility];  Service: Orthopedics;  Laterality: Right;   SHOULDER ARTHROSCOPY Left 04/11/2022   Procedure: Left shoulder arthroscopic regeneten patch, biceps tenodesis, subacromial decompression;  Surgeon: Signa Kell, MD;  Location: [facility];  Service: Orthopedics;  Laterality: Left;   SHOULDER ARTHROSCOPY WITH OPEN ROTATOR CUFF REPAIR Left 08/11/2022   Procedure: Left revision shoulder arthroscopic rotator cuff repair and extensive glenohumeral debridement;  Surgeon: Signa Kell, MD;  Location: [facility];  Service: Orthopedics;  Laterality: Left;  needs potassium draw   TONSILLECTOMY     TOTAL HIP ARTHROPLASTY Right 11/17/2017   Procedure: TOTAL HIP ARTHROPLASTY;  Surgeon: Donato Heinz, MD;  Location: [facility];  Service: Orthopedics;  Laterality: Right;   WISDOM TOOTH EXTRACTION      Social History Social History   Tobacco Use   Smoking status: Never    Passive exposure: Never   Smokeless tobacco: Never  Vaping Use   Vaping status: Never Used  Substance Use Topics   Alcohol use: No   Drug use: No    Family History Family History  Problem Relation Age of Onset   Breast cancer Mother        82's    Allergies  Allergen Reactions   Gluten Meal Diarrhea    bloating     REVIEW OF SYSTEMS (Negative unless checked)  Constitutional: [] Weight loss  [] Fever  [] Chills Cardiac: [] Chest pain   [] Chest pressure   [] Palpitations   [] Shortness of breath when laying flat   [] Shortness of breath with exertion. Vascular:  [] Pain in legs with walking   [x] Pain in legs with standing  [] History of DVT   [] Phlebitis   [x] Swelling in legs   [] Varicose veins   [] Non-healing ulcers Pulmonary:   [] Uses home oxygen   [] Productive cough   [] Hemoptysis   [] Wheeze  [] COPD   [] Asthma Neurologic:  [] Dizziness   [] Seizures   [] History of stroke   [] History of TIA  [] Aphasia   [] Vissual changes   [] Weakness or numbness in arm   [] Weakness or numbness  in leg Musculoskeletal:   [] Joint swelling   [] Joint pain   [] Low back pain Hematologic:  [] Easy bruising  [] Easy bleeding   [] Hypercoagulable state   [] Anemic Gastrointestinal:  [] Diarrhea   [] Vomiting  [] Gastroesophageal reflux/heartburn   [] Difficulty swallowing. Genitourinary:  [] Chronic kidney disease   [] Difficult urination  [] Frequent urination   [] Blood in urine Skin:  [] Rashes   [] Ulcers  Psychological:  [] History of anxiety   []  History of major depression.  Physical Examination  There were no vitals filed for this visit. There is no height or weight on file to calculate BMI. Gen: WD/WN, NAD Head: [place]/AT, No temporalis wasting.  Ear/Nose/Throat: Hearing grossly intact, nares w/o erythema or drainage, pinna without lesions Eyes: PER, EOMI, sclera nonicteric.  Neck: Supple, no gross masses.  No JVD.  Pulmonary:  Good air movement, no audible wheezing, no  use of accessory muscles.  Cardiac: RRR, precordium not hyperdynamic. Vascular:  scattered varicosities present bilaterally.  Mild to moderate venous stasis changes to the legs bilaterally.  3+ soft pitting edema, CEAP [id number]  Vessel Right Left  Radial Palpable Palpable  Gastrointestinal: soft, non-distended. No guarding/no peritoneal signs.  Musculoskeletal: M/S 5/5 throughout.  No deformity.  Neurologic: CN 2-12 intact. Pain and light touch intact in extremities.  Symmetrical.  Speech is fluent. Motor exam as listed above. Psychiatric: Judgment intact, Mood & affect appropriate for pt's clinical situation. Dermatologic: Venous rashes no ulcers noted.  No changes consistent with cellulitis. Lymph : No lichenification or skin changes of chronic lymphedema.  CBC Lab Results  Component Value Date   WBC 8.0 09/29/2023   HGB 14.7 09/29/2023   HCT 43.9 09/29/2023   MCV 94.8 09/29/2023   PLT 204 09/29/2023    BMET    Component Value Date/Time   NA 136 08/23/2023 1104   NA 137 02/20/2013 0723   K 4.0 08/23/2023 1104    K 3.7 02/20/2013 0723   CL 103 08/23/2023 1104   CL 104 02/20/2013 0723   CO2 24 08/23/2023 1104   CO2 26 02/20/2013 0723   GLUCOSE 119 (H) 08/23/2023 1104   GLUCOSE 126 (H) 02/20/2013 0723   BUN 10 08/23/2023 1104   BUN 6 (L) 02/20/2013 0723   CREATININE 0.56 08/23/2023 1104   CREATININE 0.54 (L) 02/20/2013 0723   CALCIUM 8.7 (L) 08/23/2023 1104   CALCIUM 8.7 02/20/2013 0723   GFRNONAA >60 08/23/2023 1104   GFRNONAA >60 02/20/2013 0723   GFRAA >60 12/11/2017 1121   GFRAA >60 02/20/2013 0723   CrCl cannot be calculated (Patient's most recent lab result is older than the maximum 21 days allowed.).  COAG Lab Results  Component Value Date   INR 1.1 01/12/2022   INR 1.0 02/12/2021   INR 1.0 09/23/2019    Radiology No results found.   Assessment/Plan 1. Lymphedema in adult patient Recommend:  No surgery or intervention at this point in time.    I have reviewed my discussion with the patient regarding lymphedema and why it  causes symptoms.  Patient will continue wearing graduated compression on a daily basis. The patient should put the compression on first thing in the morning and removing them in the evening. The patient should not sleep in the compression.   In addition, behavioral modification throughout the day will be continued.  This will include frequent elevation (such as in a recliner), use of over the counter pain medications as needed and exercise such as walking.  The systemic causes for chronic edema such as liver, kidney and cardiac etiologies does not appear to have significant changed over the past year.    The patient will continue aggressive use of the  lymph pump.  This will continue to improve the edema control and prevent sequela such as ulcers and infections.   The patient will follow-up with me on an annual basis.   2. Benign essential hypertension Continue antihypertensive medications as already ordered, these medications have been reviewed and  there are no changes at this time.  3. Gastroesophageal reflux disease without esophagitis Continue PPI as already ordered, this medication has been reviewed and there are no changes at this time.  Avoidence of caffeine and alcohol  Moderate elevation of the head of the bed   4. Cervical radiculopathy Continue medications to treat the patient's degenerative disease as already ordered, these medications have been reviewed and  there are no changes at this time.  She is status post recent C-spine surgery and seems to be doing well and is having an appropriate postoperative course.  Continued activity and therapy as per Dr. Lucienne Capers plan.    Levora Dredge, MD  10/11/2023 10:28 AM

## 2023-10-12 ENCOUNTER — Ambulatory Visit (INDEPENDENT_AMBULATORY_CARE_PROVIDER_SITE_OTHER): Payer: BC Managed Care – PPO | Admitting: Vascular Surgery

## 2023-10-12 ENCOUNTER — Encounter (INDEPENDENT_AMBULATORY_CARE_PROVIDER_SITE_OTHER): Payer: Self-pay | Admitting: Vascular Surgery

## 2023-10-12 VITALS — BP 106/73 | HR 76 | Resp 18 | Ht 69.0 in | Wt 307.0 lb

## 2023-10-12 DIAGNOSIS — M5412 Radiculopathy, cervical region: Secondary | ICD-10-CM

## 2023-10-12 DIAGNOSIS — I10 Essential (primary) hypertension: Secondary | ICD-10-CM | POA: Diagnosis not present

## 2023-10-12 DIAGNOSIS — I890 Lymphedema, not elsewhere classified: Secondary | ICD-10-CM | POA: Diagnosis not present

## 2023-10-12 DIAGNOSIS — K219 Gastro-esophageal reflux disease without esophagitis: Secondary | ICD-10-CM

## 2023-10-13 ENCOUNTER — Ambulatory Visit
Admission: RE | Admit: 2023-10-13 | Discharge: 2023-10-13 | Disposition: A | Discharge status: Home / Self Care | Payer: BC Managed Care – PPO | Source: Ambulatory Visit | Attending: Oncology | Admitting: Oncology

## 2023-10-13 DIAGNOSIS — Z148 Genetic carrier of other disease: Secondary | ICD-10-CM | POA: Diagnosis present

## 2023-11-08 ENCOUNTER — Encounter: Payer: Self-pay | Admitting: Oncology

## 2023-11-10 ENCOUNTER — Encounter: Payer: Self-pay | Admitting: Oncology

## 2023-11-21 ENCOUNTER — Other Ambulatory Visit: Payer: Self-pay | Admitting: Orthopedic Surgery

## 2023-11-21 DIAGNOSIS — Z981 Arthrodesis status: Secondary | ICD-10-CM

## 2023-11-21 DIAGNOSIS — M5412 Radiculopathy, cervical region: Secondary | ICD-10-CM

## 2023-11-21 NOTE — Progress Notes (Unsigned)
   REFERRING PHYSICIAN:  [name], Md [address] [place],  [place] [address]  DOS: 09/04/23  ACDF C6-C7  HISTORY OF PRESENT ILLNESS:  She was doing well at her last visit with improvement in her numbness. She still had some numbness in 3rd through 5th finger of left hand on palmar aspect.   She has intermittent left sided neck pain with intermittent pain at her incision. Also with increased numbness left hand and she is dropping things. Numbness in left hand is constant. She also has  intermittent numbness in right hand.   She is still having trouble swallowing- she gets choked on foods frequently. No vocal issues.   She is scheduled to see Dr. Allena Katz for her left shoulder.    PHYSICAL EXAMINATION:  NEUROLOGICAL:  General: In no acute distress.   Awake, alert, oriented to person, place, and time.  Pupils equal round and reactive to light.  Facial tone is symmetric.    Strength: Side Biceps Triceps Deltoid Interossei Grip Wrist Ext. Wrist Flex.  R 5 5 5 5 5 5 5   L 5 5 5 5 5 5 5    Incision well healed  Subjective numbness in left hand compared to right hand.   Imaging:  None    Assessment / Plan: Tamara Dodson is doing reasonable s/p above surgery. Numbness in left hand is worse and constant. Still having some swallowing issues as well- she chokes on food frequently.   Treatment options reviewed with patient and following plan made:   - Will get cervical xrays on her way out. Will message her with results.  - EMG/NCS of bilateral upper extremities. Orders to [facility].  - Referral to ENT for swallowing issues [facility]).  - Follow up as scheduled in 6 months with Dr. Myer Haff, but will set up phone visit to review EMG and regroup.   Advised to contact the office if any questions or concerns arise.   Drake Leach PA-C Dept of Neurosurgery

## 2023-11-23 ENCOUNTER — Other Ambulatory Visit: Payer: Self-pay

## 2023-11-23 ENCOUNTER — Encounter: Payer: Self-pay | Admitting: Orthopedic Surgery

## 2023-11-23 ENCOUNTER — Ambulatory Visit
Admission: RE | Admit: 2023-11-23 | Discharge: 2023-11-23 | Disposition: A | Discharge status: Home / Self Care | Payer: TRICARE For Life (TFL) | Attending: Orthopedic Surgery | Admitting: Orthopedic Surgery

## 2023-11-23 ENCOUNTER — Ambulatory Visit: Payer: BC Managed Care – PPO | Admitting: Orthopedic Surgery

## 2023-11-23 ENCOUNTER — Ambulatory Visit
Admission: RE | Admit: 2023-11-23 | Discharge: 2023-11-23 | Disposition: A | Discharge status: Home / Self Care | Payer: TRICARE For Life (TFL) | Source: Ambulatory Visit | Attending: Orthopedic Surgery | Admitting: Orthopedic Surgery

## 2023-11-23 ENCOUNTER — Encounter: Payer: Self-pay | Admitting: Oncology

## 2023-11-23 ENCOUNTER — Encounter: Payer: Self-pay | Admitting: Neurology

## 2023-11-23 VITALS — BP 126/76 | HR 73 | Temp 98.1°F | Ht 66.0 in | Wt 306.2 lb

## 2023-11-23 DIAGNOSIS — Z981 Arthrodesis status: Secondary | ICD-10-CM

## 2023-11-23 DIAGNOSIS — M5412 Radiculopathy, cervical region: Secondary | ICD-10-CM | POA: Insufficient documentation

## 2023-11-23 DIAGNOSIS — R202 Paresthesia of skin: Secondary | ICD-10-CM

## 2023-11-23 NOTE — Telephone Encounter (Signed)
Cervical xrays dated 11/23/23:  No complications noted.   Report is not back yet. Will review once it is done and message her with any changes.

## 2023-12-04 ENCOUNTER — Ambulatory Visit (INDEPENDENT_AMBULATORY_CARE_PROVIDER_SITE_OTHER): Payer: BC Managed Care – PPO | Admitting: Otolaryngology

## 2023-12-04 ENCOUNTER — Encounter (INDEPENDENT_AMBULATORY_CARE_PROVIDER_SITE_OTHER): Payer: Self-pay | Admitting: Otolaryngology

## 2023-12-04 VITALS — BP 114/75 | HR 72 | Ht 71.0 in | Wt 302.0 lb

## 2023-12-04 DIAGNOSIS — K219 Gastro-esophageal reflux disease without esophagitis: Secondary | ICD-10-CM | POA: Diagnosis not present

## 2023-12-04 DIAGNOSIS — M5412 Radiculopathy, cervical region: Secondary | ICD-10-CM | POA: Diagnosis not present

## 2023-12-04 DIAGNOSIS — Z9889 Other specified postprocedural states: Secondary | ICD-10-CM

## 2023-12-04 DIAGNOSIS — R1310 Dysphagia, unspecified: Secondary | ICD-10-CM | POA: Diagnosis not present

## 2023-12-04 MED ORDER — FAMOTIDINE 20 MG PO TABS: 20.0000 mg | ORAL_TABLET | Freq: Two times a day (BID) | ORAL | 3 refills | Status: DC

## 2023-12-04 NOTE — Progress Notes (Signed)
ENT CONSULT:  Reason for Consult: dysphagia    HPI: Discussed the use of AI scribe software for clinical note transcription with the patient, who gave verbal consent to proceed.  History of Present Illness   The patient is a 62 yoF, with a history of three ACDF neck surgeries, presents with progressive dysphagia. The first surgery was performed on the left side of the neck, followed by a right-sided approach, and then another left-sided surgery 08/2023. The exact timeline of these surgeries is unclear, but the most recent one was performed on September 16th, 2024 and there is a mention of another ACDF procedure in 2022 in the records. The patient started experiencing swallowing difficulties after the first surgery, with food 'getting caught' on the left side. This issue has progressively worsened, and after the third surgery, the patient reports that the problem has extended to the right side as well.  The patient describes the sensation of food getting stuck, regardless of the size of the bite or how well it is chewed. This issue is now occurring with every meal and with medication intake. The patient reports having to cough up food 2-3 times a day due to this issue, leading to a fear of eating, especially when alone. This has resulted in a weight loss of approximately 16-17 pounds.  The patient has not had any swallow studies to date. They report a history of heartburn or reflux, for which they take Tums. The patient also mentions numbness in the fingertips, which they attribute to a pinched nerve in the shoulder. This issue is being managed by Neurology.  The patient also reports a sensation of a lump on the right side of the neck, which they believe is due to scar tissue from the surgeries. They have not noticed any changes in their voice or shortness of breath. The patient has not made any significant dietary changes, but they have shifted towards softer foods and cooked vegetables. They struggle  with meats and even small pieces of [place] or celery can get stuck.     Records Reviewed:  Hx of left C6-7 ACDF 09/04/23 and another ACDF 2022 - both on the left and another one on the right   NSG office note 11/23/23 She was doing well at her last visit with improvement in her numbness. She still had some numbness in 3rd through 5th finger of left hand on palmar aspect.    She has intermittent left sided neck pain with intermittent pain at her incision. Also with increased numbness left hand and she is dropping things. Numbness in left hand is constant. She also has  intermittent numbness in right hand.    She is still having trouble swallowing- she gets choked on foods frequently. No vocal issues.   Tamara Dodson is doing reasonable s/p above surgery. Numbness in left hand is worse and constant. Still having some swallowing issues as well- she chokes on food frequently.    Treatment options reviewed with patient and following plan made:    - Will get cervical xrays on her way out. Will message her with results.  - EMG/NCS of bilateral upper extremities. Orders to [facility].  - Referral to ENT for swallowing issues [facility]).  - Follow up as scheduled in 6 months with Dr. Myer Haff, but will set up phone visit to review EMG and regroup.     Oncology note 10/03/23 HISTORY OF PRESENTING ILLNESS:  Tamara Dodson is a 62 y.o. 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  Chronic Onset, duration since 2016.  She does not smoke.  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     # Heterozygous H63D mutation.  She has a history of iron deficiency after hip replacement and she took oral iron supplementation which was discontinued.  09/24/2019 liver  biopsy showed moderate parenchymal and sinusoidal siderosis, patient heterozygous for H63D gene.  Septal fibrosis with areas suggestive of early bridging fibrosis [at least stage II fibrosis]. Focal and minimal steatosis with no evidence of steatohepatitis.  Total NAFLD score 1 of 8. --Scattered iron granules are identified on the HE sections. The iron stain highlights moderate staining in sinusoidal and periportal parenchymal patterns of distribution   Lost follow up with me after her visit in September 2020 and restablished care 03/17/2021 per recommendation of gastroenterology and the neurosurgeon Dr. Marcell Barlow   Elevated Ferritin in the context of liver fibrosis- phlebotomy   02/24/2021 s/p ACDF C3-5   07/06/22 MRI abdomen with and without contrast  1.  Normal liver morphology. No gross evidence of hepatic iron deposition. Please see addendum for full iron quantification results. 2.  Mild splenomegaly.  0.4 mg Fe / g dry weight of liver (Normal <1.8 mg / g)    Intermittent neuropathy of feet and mouth, migraine and vertigo   INTERVAL HISTORY Tamara Dodson is a 62 y.o. female who has above history reviewed by me today presents for follow up visit for management of heterozygous hemochromatosis mutation.  Liver fibrosis During the interval, patient has had cervical myelopathy surgeries, left shoulder surgeries. She was previously recommend to establish with GI and prefers to defer due to above surgeries.         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    Lumbago    Lymphedema of lower extremity    Migraines    Mitral valve insufficiency    Morbid obesity (HCC)     Neuropathy of both feet    Nonalcoholic fatty liver disease    Osteoarthritis    Radiculopathy of cervical region    Vertigo     Past Surgical History:  Procedure Laterality Date   ANTERIOR CERVICAL DECOMP/DISCECTOMY FUSION N/A 02/24/2021   Procedure: ANTERIOR CERVICAL DECOMPRESSION/DISCECTOMY FUSION 2 LEVELS C3-5;  Surgeon: Venetia Night, MD;  Location: [facility];  Service: Neurosurgery;  Laterality: N/A;   ANTERIOR CERVICAL DECOMP/DISCECTOMY FUSION N/A 01/26/2022   Procedure: C5-6 ANTERIOR CERVICAL DISCECTOMY & FUSION (GLOBUS HEDRON);  Surgeon: Venetia Night, MD;  Location: [facility];  Service: Neurosurgery;  Laterality: N/A;   ANTERIOR CERVICAL DECOMP/DISCECTOMY FUSION N/A 09/04/2023   Procedure: C6-7 ANTERIOR CERVICAL DISCECTOMY AND FUSION (HEDRON);  Surgeon: Venetia Night, MD;  Location: [facility];  Service: Neurosurgery;  Laterality: N/A;   APPENDECTOMY     CHOLECYSTECTOMY     COLONOSCOPY N/A 03/22/2023   Procedure: COLONOSCOPY;  Surgeon: Norma Fredrickson, Boykin Nearing, MD;  Location: [facility];  Service: Gastroenterology;  Laterality: N/A;   COLONOSCOPY WITH ESOPHAGOGASTRODUODENOSCOPY (EGD)     ESOPHAGOGASTRODUODENOSCOPY N/A 01/25/2021   Procedure: ESOPHAGOGASTRODUODENOSCOPY (EGD);  Surgeon: Toledo, Boykin Nearing, MD;  Location: [facility];  Service: Gastroenterology;  Laterality: N/A;  C-19 test on 01/22/2021 Requesting to be the last case due to her transportation   ESOPHAGOGASTRODUODENOSCOPY N/A 03/22/2023   Procedure: ESOPHAGOGASTRODUODENOSCOPY (EGD);  Surgeon: Toledo, Boykin Nearing, MD;  Location: [facility];  Service: Gastroenterology;  Laterality: N/A;   JOINT REPLACEMENT Left    knee, hip   JOINT REPLACEMENT Bilateral    knee   KNEE ARTHROPLASTY Right 09/21/2015   Procedure: COMPUTER ASSISTED TOTAL KNEE ARTHROPLASTY;  Surgeon: Donato Heinz, MD;  Location: [facility];  Service: Orthopedics;  Laterality: Right;   SHOULDER ARTHROSCOPY Left 04/11/2022   Procedure: Left  shoulder arthroscopic regeneten patch, biceps tenodesis, subacromial decompression;  Surgeon: Signa Kell, MD;  Location: [facility];  Service: Orthopedics;  Laterality: Left;   SHOULDER ARTHROSCOPY WITH OPEN ROTATOR CUFF REPAIR Left 08/11/2022   Procedure: Left revision shoulder arthroscopic rotator cuff repair and extensive glenohumeral debridement;  Surgeon: Signa Kell, MD;  Location: [facility];  Service: Orthopedics;  Laterality: Left;  needs potassium draw   TONSILLECTOMY     TOTAL HIP ARTHROPLASTY Right 11/17/2017   Procedure: TOTAL HIP ARTHROPLASTY;  Surgeon: Donato Heinz, MD;  Location: [facility];  Service: Orthopedics;  Laterality: Right;   WISDOM TOOTH EXTRACTION      Family History  Problem Relation Age of Onset   Breast cancer Mother        67's    Social History:  reports that she has never smoked. She has never been exposed to tobacco smoke. She has never used smokeless tobacco. She reports that she does not drink alcohol and does not use drugs.  Allergies:  Allergies  Allergen Reactions   Gluten Meal Diarrhea    bloating    Medications: I have reviewed the patient's current medications.  The PMH, PSH, Medications, Allergies, and SH were reviewed and updated.  ROS: Constitutional: Negative for fever, weight loss and weight gain. Cardiovascular: Negative for chest pain and dyspnea on exertion. Respiratory: Is not experiencing shortness of breath at rest. Gastrointestinal: Negative for nausea and vomiting. Neurological: Negative for headaches. Psychiatric: The patient is not nervous/anxious  Blood pressure 114/75, pulse 72, height 5\' 11"  (1.803 m), weight (!) 302 lb (137 kg), SpO2 95%.  PHYSICAL EXAM:  Exam: General: Well-developed, well-nourished Communication and Voice: slightly raspy Respiratory Respiratory effort: Equal inspiration and expiration without stridor Cardiovascular Peripheral Vascular: Warm extremities with equal  color/perfusion Eyes: No nystagmus with equal extraocular motion bilaterally Neuro/Psych/Balance: Patient oriented to person, place, and time; Appropriate mood and affect; Gait is intact with no imbalance; Cranial nerves I-XII are intact Head and Face Inspection: Normocephalic and atraumatic without mass or lesion Palpation: Facial skeleton intact without bony stepoffs Salivary Glands: No mass or tenderness Facial Strength: Facial motility symmetric and full bilaterally ENT Pinna: External ear intact and fully developed External canal: Canal is patent with intact skin Tympanic Membrane: Clear and mobile External Nose: No scar or anatomic deformity Internal Nose: Septum is deviated to the left. No polyp, or purulence. Mucosal edema and erythema present.  Bilateral inferior turbinate hypertrophy.  Lips, Teeth, and gums: Mucosa and teeth intact and viable TMJ: No pain to palpation with full mobility Oral cavity/oropharynx: No erythema or exudate, no lesions present Nasopharynx: No mass or lesion with intact mucosa Hypopharynx:  Intact mucosa without pooling of secretions Larynx Glottic: Full true vocal cord mobility without lesion or mass Supraglottic: Normal appearing epiglottis and AE folds Interarytenoid Space: Moderate pachydermia&edema Subglottic Space: Patent without lesion or edema Neck Neck and Trachea: Midline trachea without mass or lesion Thyroid: No mass or nodularity Lymphatics: No lymphadenopathy  Procedure: Preoperative diagnosis: dysphagia hx of ACDF x 3   Postoperative diagnosis:   Same + GERD LPR  Procedure: Flexible fiberoptic laryngoscopy  Surgeon: Ashok Croon, MD  Anesthesia: Topical lidocaine and Afrin Complications: None Condition is stable throughout exam  Indications and consent:  The patient presents to the clinic with dysphagia  Indirect laryngoscopy view was incomplete. Thus it was recommended that they undergo a flexible fiberoptic  laryngoscopy. All of the risks, benefits, and potential complications were reviewed with the patient preoperatively and verbal informed consent was obtained.  Procedure: The patient was seated upright in the clinic. Topical lidocaine and Afrin were applied to the nasal cavity. After adequate anesthesia had occurred, I then proceeded to pass the flexible telescope into the nasal cavity. The nasal cavity was patent without rhinorrhea or polyp. The nasopharynx was also patent without mass or lesion. The base of tongue was visualized and was normal. There were no signs of pooling of secretions in the piriform sinuses. The true vocal folds were mobile bilaterally. There were no signs of glottic or supraglottic mucosal lesion or mass. There was moderate interarytenoid pachydermia and post cricoid edema. The telescope was then slowly withdrawn and the patient tolerated the procedure throughout.      Studies Reviewed: C-spine XR 10/10/23 MPRESSION: C3-5 and C5-7 anterior cervical fusions without complicating feature.  MRI c-spine 03/01/23 IMPRESSION: 1. 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 Moderate left foraminal stenosis. Mild spinal stenosis. 2. Anterior cervical fusion from C3 through C6. At C4-5 there is moderate-severe left foraminal stenosis. At C5-6 there is moderate bilateral foraminal stenosis. 3. No acute osseous injury of the cervical spine  Assessment/Plan: Encounter Diagnoses  Name Primary?   Dysphagia, unspecified type Yes   Chronic GERD    History of neck surgery     Assessment and Plan    Dysphagia following ACDF x 3 worse over time Chronic dysphagia following three anterior cervical spine surgeries, with progressive worsening. Symptoms include food getting stuck, particularly on the right side, leading to frequent choking episodes. No prior swallow studies. Physical and scope exams were unremarkable.  Differential diagnosis includes scar tissue formation and potential esophageal motility disorder or GERD LPR and UES/PES/CP dysfunction, no pooling of secretions in pyriforms, had findings c/w GERD on flexible scope exam. Discussed dysphagia post-spine surgery and the need for a swallow study. Explained that scar tissue from multiple surgeries could be contributing to symptoms. Discussed potential outcomes and the role of reflux management in improving symptoms. - Order swallow study MBS esophagram - Prescribe Pepcid 20 mg twice daily - Provide education on reflux management and dietary modifications - Recommend Reflux Gourmet for post-meal use - Schedule follow-up after swallow study results  Chronic Gastroesophageal Reflux Disease (GERD) Chronic heartburn managed with Tums. Potential contribution to dysphagia due to chronic reflux causing CP muscle hypertrophy and tightness. No current prescription medications for reflux. Discussed the role of reflux in exacerbating dysphagia and the benefits of medical management with Pepcid and dietary changes. - Prescribe Pepcid twice daily - Provide education on reflux management and dietary modifications - Recommend Reflux Gourmet for post-meal use  Cervical  Radiculopathy Pinched nerve and herniated disc with associated numbness in the shoulder and fingertips. Previous epidural provided temporary relief. Scheduled for neurology consultation next month for further evaluation, possibly including an EMG. Discussed the need for further evaluation to determine the cause of persistent symptoms and potential treatment options. - Continue with neurology consultation for further evaluation and potential EMG  Follow-up - Schedule follow-up after swallow study results - Continue with neurology consultation next month.        Thank you for allowing me to participate in the care of this patient. Please do not hesitate to contact me with any questions or concerns.    Ashok Croon, MD Otolaryngology [facility] Phone: [phone] Fax: [phone]    12/04/2023, 4:40 PM

## 2023-12-04 NOTE — Patient Instructions (Addendum)

## 2023-12-05 ENCOUNTER — Other Ambulatory Visit (HOSPITAL_COMMUNITY): Payer: Self-pay | Admitting: Otolaryngology

## 2023-12-05 DIAGNOSIS — R059 Cough, unspecified: Secondary | ICD-10-CM

## 2023-12-05 DIAGNOSIS — R1310 Dysphagia, unspecified: Secondary | ICD-10-CM

## 2023-12-26 ENCOUNTER — Ambulatory Visit (INDEPENDENT_AMBULATORY_CARE_PROVIDER_SITE_OTHER): Payer: BC Managed Care – PPO | Admitting: Neurology

## 2023-12-26 DIAGNOSIS — R202 Paresthesia of skin: Secondary | ICD-10-CM

## 2023-12-26 DIAGNOSIS — M5412 Radiculopathy, cervical region: Secondary | ICD-10-CM

## 2023-12-26 NOTE — Procedures (Signed)
 [facility]  [address] [place], [address]  [place], [place] [address] Tel: [phone] Fax: [phone] Test Date:  12/26/2023  Patient: Tamara Dodson DOB: Jul 17, 1961 Physician: Venetia Potters, MD  Sex: Female Height: 5' 11 Ref Phys: Glade Boys, DEVONNA  ID#: [id number]   Technician:    History: This is a 63 year old female with bilateral hand numbness and tingling.  NCV & EMG Findings: Extensive electrodiagnostic evaluation of bilateral upper limbs shows: Bilateral median-ulnar palmar sensory responses show prolonged distal peak latency (Median Palm-Wrist, L2.3, R2.5 ms) and abnormal peak latency difference ((Median Palm-Wrist)-(Ulnar Palm-Wrist), L0.50 ms, R0.87 ms). Bilateral median, ulnar, and radial sensory responses are within normal limits. Bilateral median (APB) and ulnar (ADM) motor responses are within normal limits. Chronic motor axon loss changes without accompanying active denervation changes are seen in all tested muscles (see table below). Cervical paraspinal muscles were not tested due to history of prior cervical spine surgery.  Impression: This is an abnormal study. The findings are most consistent with the following: The residuals of diffuse intraspinal canal lesions (ie: motor radiculopathy) at bilateral C5, C6, C7, and C8 roots or segments. The findings are mild in degree electrically at all levels. Evidence of bilateral median mononeuropathy at or distal to the wrist, consistent with carpal tunnel syndrome, very mild in degree electrically bilaterally, but right worse than left. Screening studies for right or left ulnar or radial mononeuropathies are normal.     ___________________________ Venetia Potters, MD    Nerve Conduction Studies Motor Nerve Results    Latency Amplitude F-Lat Segment Distance CV Comment  Site (ms) Norm (mV) Norm (ms)  (cm) (m/s) Norm   Left Median (APB) Motor  Wrist 3.0  < 4.0 5.5  > 5.0        Elbow 7.9 - 4.9 -  Elbow-Wrist 26.5  54  > 50   Right Median (APB) Motor  Wrist 3.4  < 4.0 7.5  > 5.0        Elbow 8.4 - 6.8 -  Elbow-Wrist 26.5 53  > 50   Left Ulnar (ADM) Motor  Wrist 1.90  < 3.1 7.4  > 7.0        Bel elbow 5.5 - 7.0 -  Bel elbow-Wrist 21.5 60  > 50   Ab elbow 7.4 - 6.8 -  Ab elbow-Bel elbow 10 53 -   Right Ulnar (ADM) Motor  Wrist 1.90  < 3.1 7.4  > 7.0        Bel elbow 5.7 - 6.4 -  Bel elbow-Wrist 22 58  > 50   Ab elbow 7.5 - 6.2 -  Ab elbow-Bel elbow 10 56 -    Sensory Sites    Neg Peak Lat Amplitude (O-P) Segment Distance Velocity Comment  Site (ms) Norm (V) Norm  (cm) (ms)   Left Median Sensory  Wrist-Dig II 3.3  < 3.8 24  > 10 Wrist-Dig II 13    Right Median Sensory  Wrist-Dig II 3.5  < 3.8 17  > 10 Wrist-Dig II 13    Left Median-Ulnar Palmar Sensory       Median  Palm-Wrist *2.3  < 2.2 19  > 10 Palm-Wrist 8         Ulnar  Palm-Wrist 1.80  < 2.2 13  > 5 Palm-Wrist 8    Right Median-Ulnar Palmar Sensory       Median  Palm-Wrist *2.5  < 2.2 14  > 10 Palm-Wrist 8  Ulnar  Palm-Wrist 1.63  < 2.2 16  > 5 Palm-Wrist 8    Left Radial Sensory  Forearm-Wrist 1.60  < 2.8 19  > 10 Forearm-Wrist 10    Right Radial Sensory  Forearm-Wrist 2.0  < 2.8 13  > 10 Forearm-Wrist 10    Left Ulnar Sensory  Wrist-Dig V 2.6  < 3.2 12  > 5 Wrist-Dig V 11    Right Ulnar Sensory  Wrist-Dig V 2.6  < 3.2 16  > 5 Wrist-Dig V 11     Inter-Nerve Comparisons   Nerve 1 Value 1 Nerve 2 Value 2 Parameter Result Normal  Sensory Sites  R Median Palm-Wrist 2.5 ms R Ulnar Palm-Wrist 1.63 ms Peak Lat Diff *0.87 ms <0.40  L Median Palm-Wrist 2.3 ms L Ulnar Palm-Wrist 1.80 ms Peak Lat Diff *0.50 ms <0.40   Electromyography   Side Muscle Ins.Act Fibs Fasc Recrt Amp Dur Poly Activation Comment  Left EIP Nml Nml Nml *1- *1+ *1+ *1+ Nml N/A  Left FDI Nml Nml Nml *1- *1+ *1+ *1+ Nml N/A  Left Pronator teres Nml Nml Nml *1- *1+ *1+ Nml Nml N/A  Left Biceps Nml Nml Nml *1- *1+ *1+ Nml Nml N/A  Left Triceps Nml Nml Nml  *1- *1+ *1+ *1+ Nml N/A  Left Deltoid Nml Nml Nml *1- *1+ *1+ Nml Nml N/A  Right FDI Nml Nml Nml *2- *1+ *1+ Nml Nml N/A  Right EIP Nml Nml Nml *1- *1+ *1+ Nml Nml N/A  Right Pronator teres Nml Nml Nml *1- *1+ *1+ Nml Nml N/A  Right Biceps Nml Nml Nml *1- *1+ *1+ Nml Nml N/A  Right Triceps Nml Nml Nml *1- *1+ *1+ Nml Nml N/A  Right Deltoid Nml Nml Nml *1- *1+ *1+ Nml Nml N/A      Waveforms:  Motor           Sensory

## 2023-12-28 NOTE — Progress Notes (Signed)
   Telephone Visit- Progress Note: Referring Physician:  Valora Agent, MD [address] [place],  [place] [address]  Primary Physician:  Valora Agent, MD  This visit was performed via telephone.  Patient location: home Provider location: office  I spent a total of 10 minutes non-face-to-face activities for this visit on the date of this encounter including review of current clinical condition and response to treatment.    Patient has given verbal consent to this telephone visits and we reviewed the limitations of a telephone visit. Patient wishes to proceed.    Chief Complaint:  follow up EMG  History of Present Illness:  DOS: 09/04/23  ACDF C6-C7  HISTORY OF PRESENT ILLNESS:  Phone visit scheduled to review her EMG.   She is about the same. She has intermittent left sided neck pain. No arm pain. She notes intermittent numbness in both hands, right = left hand. It is worse at night.   She was diagnosed with carpal tunnel years ago. She has done night time splints and had injections with no relief.    She saw Dr. Tobie for her shoulder- she was diagnosed with frozen shoulder. She had an injection on 12/27/23. She has not see any improvement in pain or movement yet.   She also saw Dr. Soldatova as well.   Exam: No exam done as this was a telephone encounter.     Imaging: EMG of bilateral upper extremities dated 12/26/23:  Impression: This is an abnormal study. The findings are most consistent with the following: The residuals of diffuse intraspinal canal lesions (ie: motor radiculopathy) at bilateral C5, C6, C7, and C8 roots or segments. The findings are mild in degree electrically at all levels. Evidence of bilateral median mononeuropathy at or distal to the wrist, consistent with carpal tunnel syndrome, very mild in degree electrically bilaterally, but right worse than left. Screening studies for right or left ulnar or radial mononeuropathies are  normal.        ___________________________ Tamara Potters, MD  I have personally reviewed the images and agree with the above interpretation.  Assessment and Plan: [name] is doing reasonable s/p above surgery. She continues with left neck/shoulder pain and a lot of this is likely shoulder mediated (ortho diagnosed with frozen shoulder).   EMG shows mild right > left carpal tunnel.   Treatment options discussed with patient and following plan made:   - Continue to follow with ortho for her left shoulder.  - Follow up with ENT as scheduled for further testing.  - No relief with previous carpal tunnel bracing or injections, will have her see Dr. Claudene to discuss possible surgery options.  - Keep her postop follow up with Dr. Clois regarding her cervical fusion.   Glade Boys PA-C Neurosurgery

## 2023-12-29 ENCOUNTER — Encounter: Payer: Self-pay | Admitting: Orthopedic Surgery

## 2023-12-29 ENCOUNTER — Ambulatory Visit (INDEPENDENT_AMBULATORY_CARE_PROVIDER_SITE_OTHER): Payer: BC Managed Care – PPO | Admitting: Orthopedic Surgery

## 2023-12-29 DIAGNOSIS — G5603 Carpal tunnel syndrome, bilateral upper limbs: Secondary | ICD-10-CM | POA: Diagnosis not present

## 2023-12-29 DIAGNOSIS — Z981 Arthrodesis status: Secondary | ICD-10-CM | POA: Diagnosis not present

## 2023-12-29 DIAGNOSIS — M25512 Pain in left shoulder: Secondary | ICD-10-CM | POA: Diagnosis not present

## 2024-01-01 ENCOUNTER — Other Ambulatory Visit: Payer: Self-pay

## 2024-01-01 ENCOUNTER — Encounter: Payer: Self-pay | Admitting: Oncology

## 2024-01-01 ENCOUNTER — Ambulatory Visit (INDEPENDENT_AMBULATORY_CARE_PROVIDER_SITE_OTHER): Payer: TRICARE For Life (TFL) | Admitting: Neurosurgery

## 2024-01-01 DIAGNOSIS — Z01818 Encounter for other preprocedural examination: Secondary | ICD-10-CM

## 2024-01-01 DIAGNOSIS — G5603 Carpal tunnel syndrome, bilateral upper limbs: Secondary | ICD-10-CM | POA: Diagnosis not present

## 2024-01-01 NOTE — Progress Notes (Signed)
 Referring Physician:  [name], MD [address] [place],  [place] [address]  Primary Physician:  [name], MD  History of Present Illness: 01/01/2024 Tamara Dodson is here today with a chief complaint of bilateral carpal tunnel syndrome.  She is known about this for multiple years.  She gets nighttime symptoms where she has to wake up in shake out her hands.  She also has a history of cervical radiculopathy status post cervical surgery.  Also left-sided shoulder surgery with frozen shoulder.  She has treated this conservatively with nighttime braces and injections.  She is not getting any better.  She feels that she is getting worse.  She started dropping things more often.  Feeling persistent loss of sensation in her fingertips.  She notices she gets her worse symptoms while she is sleeping, holding a phone, even sometimes when her hands are in certain positions I will fall asleep.   Conservative measures:  Physical therapy: Yes Occupational Therapy: No, has tried nighttime bracing Hand Therapy: No Injections: Yes, bilateral carpal tunnel Gabapentin: Yes, Lyrica: No, Cymbalta: No Past Surgery: No, she has had surgery on her neck 3 times  Review of Systems:  A 10 point review of systems is negative, except for the pertinent positives and negatives detailed in the HPI.  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    Lumbago    Lymphedema of lower extremity    Migraines    Mitral valve insufficiency    Morbid obesity (HCC)    Neuropathy of both feet    Nonalcoholic fatty liver disease    Osteoarthritis     Radiculopathy of cervical region    Vertigo     Past Surgical History: Past Surgical History:  Procedure Laterality Date   ANTERIOR CERVICAL DECOMP/DISCECTOMY FUSION N/A 02/24/2021   Procedure: ANTERIOR CERVICAL DECOMPRESSION/DISCECTOMY FUSION 2 LEVELS C3-5;  Surgeon: Venetia Night, MD;  Location: [facility];  Service: Neurosurgery;  Laterality: N/A;   ANTERIOR CERVICAL DECOMP/DISCECTOMY FUSION N/A 01/26/2022   Procedure: C5-6 ANTERIOR CERVICAL DISCECTOMY & FUSION (GLOBUS HEDRON);  Surgeon: Venetia Night, MD;  Location: [facility];  Service: Neurosurgery;  Laterality: N/A;   ANTERIOR CERVICAL DECOMP/DISCECTOMY FUSION N/A 09/04/2023   Procedure: C6-7 ANTERIOR CERVICAL DISCECTOMY AND FUSION (HEDRON);  Surgeon: Venetia Night, MD;  Location: [facility];  Service: Neurosurgery;  Laterality: N/A;   APPENDECTOMY     CHOLECYSTECTOMY     COLONOSCOPY N/A 03/22/2023   Procedure: COLONOSCOPY;  Surgeon: Toledo, Boykin Nearing, MD;  Location: [facility];  Service: Gastroenterology;  Laterality: N/A;   COLONOSCOPY WITH ESOPHAGOGASTRODUODENOSCOPY (EGD)     ESOPHAGOGASTRODUODENOSCOPY N/A 01/25/2021   Procedure: ESOPHAGOGASTRODUODENOSCOPY (EGD);  Surgeon: Toledo, Boykin Nearing, MD;  Location: [facility];  Service: Gastroenterology;  Laterality: N/A;  C-19 test on 01/22/2021 Requesting to be the last case due to her transportation   ESOPHAGOGASTRODUODENOSCOPY N/A 03/22/2023   Procedure: ESOPHAGOGASTRODUODENOSCOPY (EGD);  Surgeon: Toledo, Boykin Nearing, MD;  Location: [facility];  Service: Gastroenterology;  Laterality: N/A;   JOINT REPLACEMENT Left    knee,  hip   JOINT REPLACEMENT Bilateral    knee   KNEE ARTHROPLASTY Right 09/21/2015   Procedure: COMPUTER ASSISTED TOTAL KNEE ARTHROPLASTY;  Surgeon: Donato Heinz, MD;  Location: [facility];  Service: Orthopedics;  Laterality: Right;   SHOULDER ARTHROSCOPY Left 04/11/2022   Procedure: Left shoulder arthroscopic regeneten patch, biceps tenodesis,  subacromial decompression;  Surgeon: Signa Kell, MD;  Location: [facility];  Service: Orthopedics;  Laterality: Left;   SHOULDER ARTHROSCOPY WITH OPEN ROTATOR CUFF REPAIR Left 08/11/2022   Procedure: Left revision shoulder arthroscopic rotator cuff repair and extensive glenohumeral debridement;  Surgeon: Signa Kell, MD;  Location: [facility];  Service: Orthopedics;  Laterality: Left;  needs potassium draw   TONSILLECTOMY     TOTAL HIP ARTHROPLASTY Right 11/17/2017   Procedure: TOTAL HIP ARTHROPLASTY;  Surgeon: Donato Heinz, MD;  Location: [facility];  Service: Orthopedics;  Laterality: Right;   WISDOM TOOTH EXTRACTION      Allergies: Allergies as of 01/01/2024 - Review Complete 01/01/2024  Allergen Reaction Noted   Gluten meal Diarrhea 02/03/2021    Medications:  Current Outpatient Medications:    acetaminophen (TYLENOL) 500 MG tablet, Take 500 mg by mouth every 6 (six) hours as needed for moderate pain., Disp: , Rfl:    Ascorbic Acid (VITAMIN C) 1000 MG tablet, Take 1,000 mg by mouth daily. (Patient not taking: Reported on 12/04/2023), Disp: , Rfl:    etodolac (LODINE) 500 MG tablet, Take 1 tablet by mouth 2 (two) times daily., Disp: , Rfl:    famotidine (PEPCID) 20 MG tablet, Take 1 tablet (20 mg total) by mouth 2 (two) times daily., Disp: 30 tablet, Rfl: 3   furosemide (LASIX) 20 MG tablet, Take 40 mg by mouth in the morning., Disp: , Rfl:    gabapentin (NEURONTIN) 400 MG capsule, Take 400 mg by mouth 3 (three) times daily., Disp: , Rfl:    Galcanezumab-gnlm 120 MG/ML SOAJ, Inject into the skin every 30 (thirty) days., Disp: , Rfl:    losartan (COZAAR) 50 MG tablet, Take 1 tablet by mouth every morning., Disp: , Rfl:    meclizine (ANTIVERT) 12.5 MG tablet, Take 12.5 mg by mouth 3 (three) times daily., Disp: , Rfl:    methocarbamol (ROBAXIN) 750 MG tablet, Take 1 tablet by mouth 4 times daily (Patient not taking: Reported on 12/04/2023), Disp: 120 tablet, Rfl: 0    propranolol (INDERAL) 60 MG tablet, Take 60 mg by mouth 2 (two) times daily., Disp: , Rfl:    rizatriptan (MAXALT) 10 MG tablet, Take 1 tablet (10 mg total) by mouth as directed for Migraine May take a second dose after 2 hours if needed., Disp: , Rfl:    senna (SENOKOT) 8.6 MG TABS tablet, Take 1 tablet (8.6 mg total) by mouth daily as needed for mild constipation., Disp: 30 tablet, Rfl: 0   traZODone (DESYREL) 50 MG tablet, Take 3 tablets by mouth at bedtime., Disp: , Rfl:    venlafaxine (EFFEXOR) 37.5 MG tablet, Take 37.5 mg by mouth 2 (two) times daily with a meal. for headaches, Disp: , Rfl:   Social History: Social History   Tobacco Use   Smoking status: Never    Passive exposure: Never   Smokeless tobacco: Never  Vaping Use   Vaping status: Never Used  Substance Use Topics   Alcohol use: No   Drug use: No    Family Medical History: Family History  Problem Relation Age of Onset   Breast cancer Mother  40's    Physical Examination: There were no vitals filed for this visit.  General: Patient is in no apparent distress. Attention to examination is appropriate.  Neck:   Supple.  Full range of motion.  Respiratory: Patient is breathing without any difficulty.   NEUROLOGICAL:     Awake, alert, oriented to person, place, and time.  Speech is clear and fluent.   Cranial Nerves: Pupils equal round and reactive to light.  Facial tone is symmetric. Shoulder shrug is symmetric. Tongue protrusion is midline.  There is no pronator drift.  Motor Exam:  Has some mild loss of volume in her bilateral thenar pads, at least 4+ out of 5 strength in her median intrinsic musculature.  Decree sensation predominantly in the median distribution.  When comparing finger to finger she does notice some sensation loss in her pinky, however she feels like this might be worse in her index finger but not significantly.  Bilateral carpal compression test gives her tingling and numbness in  her median distribution bilaterally.  She also has a positive Phalen's and reverse Phalen's.  Also has a positive Tinel sign at the wrist.  Medical Decision Making   Electrodiagnostics:  [facility]  [address] [place], [address]  [place], [place] [address] Tel: [phone] Fax: [phone] Test Date:  12/26/2023   Patient: Tamara Dodson DOB: [birth year] Physician: Jacquelyne Balint, MD  Sex: Female Height: 5\' 11"  Ref Phys: Drake Leach, PA-C  ID#: [id number]     Technician:      History: This is a 63 year old female with bilateral hand numbness and tingling.   NCV & EMG Findings: Extensive electrodiagnostic evaluation of bilateral upper limbs shows: Bilateral median-ulnar palmar sensory responses show prolonged distal peak latency (Median Palm-Wrist, L2.3, R2.5 ms) and abnormal peak latency difference ((Median Palm-Wrist)-(Ulnar Palm-Wrist), L0.50 ms, R0.87 ms). Bilateral median, ulnar, and radial sensory responses are within normal limits. Bilateral median (APB) and ulnar (ADM) motor responses are within normal limits. Chronic motor axon loss changes without accompanying active denervation changes are seen in all tested muscles (see table below). Cervical paraspinal muscles were not tested due to history of prior cervical spine surgery.   Impression: This is an abnormal study. The findings are most consistent with the following: The residuals of diffuse intraspinal canal lesions (ie: motor radiculopathy) at bilateral C5, C6, C7, and C8 roots or segments. The findings are mild in degree electrically at all levels. Evidence of bilateral median mononeuropathy at or distal to the wrist, consistent with carpal tunnel syndrome, very mild in degree electrically bilaterally, but right worse than left. Screening studies for right or left ulnar or radial mononeuropathies are normal.        ___________________________ Jacquelyne Balint, MD    I have personally reviewed the images and  electrodiagnostics and agree with the above interpretation.  Assessment and Plan: Tamara Dodson is a pleasant 63 y.o. female with history of cervical radiculopathy, shoulder arthritis, and bilateral carpal tunnel syndrome.  She has been dealing with bilateral carpal tunnel syndrome for multiple years.  She was hoping it would go away on its own.  She has tried some conservative measures including nighttime bracing as well as carpal tunnel injections without significant longstanding improvement.  She had a recent EMG which demonstrated diffuse radicular changes bilaterally from C5-C8, as well as carpal tunnel syndrome at the wrist.  It was electrodiagnostically mild.  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  Given her severity of symptoms with nighttime waking, mild weakness, and refractory comes to conservative management we would like to move forward with a right sided carpal tunnel decompression to decompress her median nerve at the wrist.  We did tell her that this would not help with any of her proximal shoulder pains or issues.  And that in most anatomical instances this would not extend symptom relief into her pinky.  We explained that that was likely caused by her prior radiculopathy.  She would like to go forward with carpal tunnel decompression on the right, her left arm is currently being treated for frozen shoulder and she like to wait on that arm until the right hand has healed.   Thank you for involving me in the care of this patient.   Spent 30 minutes evaluating the patient, reviewing her chart, going over treatment options, performing direct face-to-face evaluation, counseling her on her condition, and planning her care.  Lovenia Kim MD/MSCR Neurosurgery - Peripheral Nerve Surgery

## 2024-01-01 NOTE — Patient Instructions (Signed)
 Please see below for information in regards to your upcoming surgery:   Planned surgery: Right carpal tunnel release with ultrasound guidance, possible convert to open   Surgery date: 01/23/24 at [facility] ([facility]: [address], [place], [place] [address]) - you will find out your arrival time the business day before your surgery.   Pre-op appointment at [facility] Pre-admit Testing: we will call you with a date/time for this. If you are scheduled for an in person appointment, Pre-admit Testing is located on the first floor of the [facility] building, [address] [facility], [address]. Please bring all prescriptions in the original prescription bottles to your appointment. During this appointment, they will advise you which medications you can take the morning of surgery, and which medications you will need to hold for surgery. Labs (such as blood work, EKG) may be done at your pre-op appointment. You are not required to fast for these labs. Should you need to change your pre-op appointment, please call Pre-admit testing at [phone].     How to contact us :  If you have any questions/concerns before or after surgery, you can reach us  at [phone], or you can send a mychart message. We can be reached by phone or mychart 8am-4pm, Monday-Friday.  *Please note: Calls after 4pm are forwarded to a third party answering service. Mychart messages are not routinely monitored during evenings, weekends, and holidays. Please call our office to contact the answering service for urgent concerns during non-business hours.     If you have FMLA/disability paperwork, please drop it off or fax it to [phone], attention Patty.   Appointments/FMLA & disability paperwork: Odetta Mora, & Ritta Registered Nurse/Surgery scheduler: [facility] Medical Assistants: Damien ODESSIA Sailors Physician Assistants: Lyle Decamp, PA-C, Edsel Goods, PA-C & Glade Boys,  PA-C Surgeons: Reeves Daisy, MD & Penne Sharps, MD

## 2024-01-01 NOTE — H&P (View-Only) (Signed)
Referring Physician:  [name], MD [address] [place],  [place] [address]  Primary Physician:  [name], MD  History of Present Illness: 01/01/2024 Tamara Dodson is here today with a chief complaint of bilateral carpal tunnel syndrome.  She is known about this for multiple years.  She gets nighttime symptoms where she has to wake up in shake out her hands.  She also has a history of cervical radiculopathy status post cervical surgery.  Also left-sided shoulder surgery with frozen shoulder.  She has treated this conservatively with nighttime braces and injections.  She is not getting any better.  She feels that she is getting worse.  She started dropping things more often.  Feeling persistent loss of sensation in her fingertips.  She notices she gets her worse symptoms while she is sleeping, holding a phone, even sometimes when her hands are in certain positions I will fall asleep.   Conservative measures:  Physical therapy: Yes Occupational Therapy: No, has tried nighttime bracing Hand Therapy: No Injections: Yes, bilateral carpal tunnel Gabapentin: Yes, Lyrica: No, Cymbalta: No Past Surgery: No, she has had surgery on her neck 3 times  Review of Systems:  A 10 point review of systems is negative, except for the pertinent positives and negatives detailed in the HPI.  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    Lumbago    Lymphedema of lower extremity    Migraines    Mitral valve insufficiency    Morbid obesity (HCC)    Neuropathy of both feet    Nonalcoholic fatty liver disease    Osteoarthritis     Radiculopathy of cervical region    Vertigo     Past Surgical History: Past Surgical History:  Procedure Laterality Date   ANTERIOR CERVICAL DECOMP/DISCECTOMY FUSION N/A 02/24/2021   Procedure: ANTERIOR CERVICAL DECOMPRESSION/DISCECTOMY FUSION 2 LEVELS C3-5;  Surgeon: Venetia Night, MD;  Location: [facility];  Service: Neurosurgery;  Laterality: N/A;   ANTERIOR CERVICAL DECOMP/DISCECTOMY FUSION N/A 01/26/2022   Procedure: C5-6 ANTERIOR CERVICAL DISCECTOMY & FUSION (GLOBUS HEDRON);  Surgeon: Venetia Night, MD;  Location: [facility];  Service: Neurosurgery;  Laterality: N/A;   ANTERIOR CERVICAL DECOMP/DISCECTOMY FUSION N/A 09/04/2023   Procedure: C6-7 ANTERIOR CERVICAL DISCECTOMY AND FUSION (HEDRON);  Surgeon: Venetia Night, MD;  Location: [facility];  Service: Neurosurgery;  Laterality: N/A;   APPENDECTOMY     CHOLECYSTECTOMY     COLONOSCOPY N/A 03/22/2023   Procedure: COLONOSCOPY;  Surgeon: Toledo, Boykin Nearing, MD;  Location: [facility];  Service: Gastroenterology;  Laterality: N/A;   COLONOSCOPY WITH ESOPHAGOGASTRODUODENOSCOPY (EGD)     ESOPHAGOGASTRODUODENOSCOPY N/A 01/25/2021   Procedure: ESOPHAGOGASTRODUODENOSCOPY (EGD);  Surgeon: Toledo, Boykin Nearing, MD;  Location: [facility];  Service: Gastroenterology;  Laterality: N/A;  C-19 test on 01/22/2021 Requesting to be the last case due to her transportation   ESOPHAGOGASTRODUODENOSCOPY N/A 03/22/2023   Procedure: ESOPHAGOGASTRODUODENOSCOPY (EGD);  Surgeon: Toledo, Boykin Nearing, MD;  Location: [facility];  Service: Gastroenterology;  Laterality: N/A;   JOINT REPLACEMENT Left    knee,  hip   JOINT REPLACEMENT Bilateral    knee   KNEE ARTHROPLASTY Right 09/21/2015   Procedure: COMPUTER ASSISTED TOTAL KNEE ARTHROPLASTY;  Surgeon: Donato Heinz, MD;  Location: [facility];  Service: Orthopedics;  Laterality: Right;   SHOULDER ARTHROSCOPY Left 04/11/2022   Procedure: Left shoulder arthroscopic regeneten patch, biceps tenodesis,  subacromial decompression;  Surgeon: Signa Kell, MD;  Location: [facility];  Service: Orthopedics;  Laterality: Left;   SHOULDER ARTHROSCOPY WITH OPEN ROTATOR CUFF REPAIR Left 08/11/2022   Procedure: Left revision shoulder arthroscopic rotator cuff repair and extensive glenohumeral debridement;  Surgeon: Signa Kell, MD;  Location: [facility];  Service: Orthopedics;  Laterality: Left;  needs potassium draw   TONSILLECTOMY     TOTAL HIP ARTHROPLASTY Right 11/17/2017   Procedure: TOTAL HIP ARTHROPLASTY;  Surgeon: Donato Heinz, MD;  Location: [facility];  Service: Orthopedics;  Laterality: Right;   WISDOM TOOTH EXTRACTION      Allergies: Allergies as of 01/01/2024 - Review Complete 01/01/2024  Allergen Reaction Noted   Gluten meal Diarrhea 02/03/2021    Medications:  Current Outpatient Medications:    acetaminophen (TYLENOL) 500 MG tablet, Take 500 mg by mouth every 6 (six) hours as needed for moderate pain., Disp: , Rfl:    Ascorbic Acid (VITAMIN C) 1000 MG tablet, Take 1,000 mg by mouth daily. (Patient not taking: Reported on 12/04/2023), Disp: , Rfl:    etodolac (LODINE) 500 MG tablet, Take 1 tablet by mouth 2 (two) times daily., Disp: , Rfl:    famotidine (PEPCID) 20 MG tablet, Take 1 tablet (20 mg total) by mouth 2 (two) times daily., Disp: 30 tablet, Rfl: 3   furosemide (LASIX) 20 MG tablet, Take 40 mg by mouth in the morning., Disp: , Rfl:    gabapentin (NEURONTIN) 400 MG capsule, Take 400 mg by mouth 3 (three) times daily., Disp: , Rfl:    Galcanezumab-gnlm 120 MG/ML SOAJ, Inject into the skin every 30 (thirty) days., Disp: , Rfl:    losartan (COZAAR) 50 MG tablet, Take 1 tablet by mouth every morning., Disp: , Rfl:    meclizine (ANTIVERT) 12.5 MG tablet, Take 12.5 mg by mouth 3 (three) times daily., Disp: , Rfl:    methocarbamol (ROBAXIN) 750 MG tablet, Take 1 tablet by mouth 4 times daily (Patient not taking: Reported on 12/04/2023), Disp: 120 tablet, Rfl: 0    propranolol (INDERAL) 60 MG tablet, Take 60 mg by mouth 2 (two) times daily., Disp: , Rfl:    rizatriptan (MAXALT) 10 MG tablet, Take 1 tablet (10 mg total) by mouth as directed for Migraine May take a second dose after 2 hours if needed., Disp: , Rfl:    senna (SENOKOT) 8.6 MG TABS tablet, Take 1 tablet (8.6 mg total) by mouth daily as needed for mild constipation., Disp: 30 tablet, Rfl: 0   traZODone (DESYREL) 50 MG tablet, Take 3 tablets by mouth at bedtime., Disp: , Rfl:    venlafaxine (EFFEXOR) 37.5 MG tablet, Take 37.5 mg by mouth 2 (two) times daily with a meal. for headaches, Disp: , Rfl:   Social History: Social History   Tobacco Use   Smoking status: Never    Passive exposure: Never   Smokeless tobacco: Never  Vaping Use   Vaping status: Never Used  Substance Use Topics   Alcohol use: No   Drug use: No    Family Medical History: Family History  Problem Relation Age of Onset   Breast cancer Mother  40's    Physical Examination: There were no vitals filed for this visit.  General: Patient is in no apparent distress. Attention to examination is appropriate.  Neck:   Supple.  Full range of motion.  Respiratory: Patient is breathing without any difficulty.   NEUROLOGICAL:     Awake, alert, oriented to person, place, and time.  Speech is clear and fluent.   Cranial Nerves: Pupils equal round and reactive to light.  Facial tone is symmetric. Shoulder shrug is symmetric. Tongue protrusion is midline.  There is no pronator drift.  Motor Exam:  Has some mild loss of volume in her bilateral thenar pads, at least 4+ out of 5 strength in her median intrinsic musculature.  Decree sensation predominantly in the median distribution.  When comparing finger to finger she does notice some sensation loss in her pinky, however she feels like this might be worse in her index finger but not significantly.  Bilateral carpal compression test gives her tingling and numbness in  her median distribution bilaterally.  She also has a positive Phalen's and reverse Phalen's.  Also has a positive Tinel sign at the wrist.  Medical Decision Making   Electrodiagnostics:  [facility]  [address] [place], [address]  [place], [place] [address] Tel: [phone] Fax: [phone] Test Date:  12/26/2023   Patient: Tamara Dodson DOB: [birth year] Physician: Jacquelyne Balint, MD  Sex: Female Height: 5\' 11"  Ref Phys: Drake Leach, PA-C  ID#: [id number]     Technician:      History: This is a 63 year old female with bilateral hand numbness and tingling.   NCV & EMG Findings: Extensive electrodiagnostic evaluation of bilateral upper limbs shows: Bilateral median-ulnar palmar sensory responses show prolonged distal peak latency (Median Palm-Wrist, L2.3, R2.5 ms) and abnormal peak latency difference ((Median Palm-Wrist)-(Ulnar Palm-Wrist), L0.50 ms, R0.87 ms). Bilateral median, ulnar, and radial sensory responses are within normal limits. Bilateral median (APB) and ulnar (ADM) motor responses are within normal limits. Chronic motor axon loss changes without accompanying active denervation changes are seen in all tested muscles (see table below). Cervical paraspinal muscles were not tested due to history of prior cervical spine surgery.   Impression: This is an abnormal study. The findings are most consistent with the following: The residuals of diffuse intraspinal canal lesions (ie: motor radiculopathy) at bilateral C5, C6, C7, and C8 roots or segments. The findings are mild in degree electrically at all levels. Evidence of bilateral median mononeuropathy at or distal to the wrist, consistent with carpal tunnel syndrome, very mild in degree electrically bilaterally, but right worse than left. Screening studies for right or left ulnar or radial mononeuropathies are normal.        ___________________________ Jacquelyne Balint, MD    I have personally reviewed the images and  electrodiagnostics and agree with the above interpretation.  Assessment and Plan: Ms. Markovic is a pleasant 63 y.o. female with history of cervical radiculopathy, shoulder arthritis, and bilateral carpal tunnel syndrome.  She has been dealing with bilateral carpal tunnel syndrome for multiple years.  She was hoping it would go away on its own.  She has tried some conservative measures including nighttime bracing as well as carpal tunnel injections without significant longstanding improvement.  She had a recent EMG which demonstrated diffuse radicular changes bilaterally from C5-C8, as well as carpal tunnel syndrome at the wrist.  It was electrodiagnostically mild.  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  Given her severity of symptoms with nighttime waking, mild weakness, and refractory comes to conservative management we would like to move forward with a right sided carpal tunnel decompression to decompress her median nerve at the wrist.  We did tell her that this would not help with any of her proximal shoulder pains or issues.  And that in most anatomical instances this would not extend symptom relief into her pinky.  We explained that that was likely caused by her prior radiculopathy.  She would like to go forward with carpal tunnel decompression on the right, her left arm is currently being treated for frozen shoulder and she like to wait on that arm until the right hand has healed.   Thank you for involving me in the care of this patient.   Spent 30 minutes evaluating the patient, reviewing her chart, going over treatment options, performing direct face-to-face evaluation, counseling her on her condition, and planning her care.  Lovenia Kim MD/MSCR Neurosurgery - Peripheral Nerve Surgery

## 2024-01-03 ENCOUNTER — Ambulatory Visit (HOSPITAL_COMMUNITY)
Admission: RE | Admit: 2024-01-03 | Discharge: 2024-01-03 | Disposition: A | Discharge status: Home / Self Care | Payer: BC Managed Care – PPO | Source: Ambulatory Visit | Attending: Otolaryngology

## 2024-01-03 ENCOUNTER — Ambulatory Visit (HOSPITAL_COMMUNITY)
Admission: RE | Admit: 2024-01-03 | Discharge: 2024-01-03 | Disposition: A | Discharge status: Home / Self Care | Payer: BC Managed Care – PPO | Source: Ambulatory Visit | Attending: Otolaryngology | Admitting: Otolaryngology

## 2024-01-03 ENCOUNTER — Ambulatory Visit (HOSPITAL_COMMUNITY)
Admission: RE | Admit: 2024-01-03 | Discharge: 2024-01-03 | Disposition: A | Discharge status: Home / Self Care | Payer: BC Managed Care – PPO | Source: Ambulatory Visit | Attending: *Deleted | Admitting: *Deleted

## 2024-01-03 DIAGNOSIS — R1310 Dysphagia, unspecified: Secondary | ICD-10-CM | POA: Diagnosis present

## 2024-01-03 DIAGNOSIS — W44F3XA Food entering into or through a natural orifice, initial encounter: Secondary | ICD-10-CM | POA: Diagnosis not present

## 2024-01-03 DIAGNOSIS — R059 Cough, unspecified: Secondary | ICD-10-CM | POA: Diagnosis not present

## 2024-01-03 NOTE — Progress Notes (Signed)
 Modified Barium Swallow Study  Patient Details  Name: Tamara Dodson MRN: [id number] Date of Birth: Feb 01, 1961  Today's Date: 01/03/2024  Modified Barium Swallow completed.  Full report located under Chart Review in the Imaging Section.  History of Present Illness Tamara Dodson is a 63 yo female presenting for OP MBS with complaints of progressive dysphagia s/p three ACDF procedures. She reports solids "getting caught" on the R side resulting in coughing episodes and eventual regurgitation. She states this happens regardless of attempting soft solids or masticating boluses completely. 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 Scheduled to complete a barium esophagram following MBS. PMH includes anemia, celiac disease, cirrhosis, DJD, GERD, HTN, lumbago, lymphedema of LE, migraines, neuropathy, osteoarthritis, vertigo, ACDF (2022, 2023, 2024)   Clinical Impression Pt presents with a functional oropharyngeal swallow. Thin liquids resulted in transient penetration, which is considered a normal variant of swallowing. There is no significant pharyngeal residue even with regular texture solids. Consider further esophageal assessment given reports of globus and regurgitation. Recommend she continue her regular diet as able with thin liquids. She may benefit from the use of extra sauces to soften food further. Take pills whole with purees to ease transit. Discussed results of MBS with pt and provided education. Factors that may increase risk of adverse event in presence of aspiration Roderick Civatte & Jessy Morocco 2021): Limited mobility  Swallow Evaluation Recommendations Recommendations: PO diet PO Diet Recommendation: Regular;Thin liquids (Level 0) Liquid Administration via: Cup;Straw Medication Administration: Whole meds with puree Supervision: Patient able to self-feed Swallowing strategies  : Slow rate;Small  bites/sips;Follow solids with liquids Postural changes: Position pt fully upright for meals;Stay upright 30-60 min after meals Oral care recommendations: Oral care BID (2x/day)      Amil Kale, M.A., CF-SLP Speech Language Pathology, Acute Rehabilitation Services  Secure Chat preferred [phone]  01/03/2024,10:41 AM

## 2024-01-13 ENCOUNTER — Ambulatory Visit (HOSPITAL_COMMUNITY): Payer: TRICARE For Life (TFL)

## 2024-01-16 ENCOUNTER — Encounter: Payer: Self-pay | Admitting: Urgent Care

## 2024-01-16 ENCOUNTER — Other Ambulatory Visit: Payer: Self-pay

## 2024-01-16 ENCOUNTER — Encounter
Admission: RE | Admit: 2024-01-16 | Discharge: 2024-01-16 | Disposition: A | Discharge status: Home / Self Care | Payer: BC Managed Care – PPO | Source: Ambulatory Visit | Attending: Neurosurgery | Admitting: Neurosurgery

## 2024-01-16 VITALS — Ht 71.0 in | Wt 297.0 lb

## 2024-01-16 DIAGNOSIS — Z01812 Encounter for preprocedural laboratory examination: Secondary | ICD-10-CM

## 2024-01-16 DIAGNOSIS — I10 Essential (primary) hypertension: Secondary | ICD-10-CM | POA: Insufficient documentation

## 2024-01-16 HISTORY — DX: Adhesive capsulitis of unspecified shoulder: M75.00

## 2024-01-16 HISTORY — DX: Portal hypertension: K76.6

## 2024-01-16 HISTORY — DX: Essential (primary) hypertension: I10

## 2024-01-16 LAB — BASIC METABOLIC PANEL
Anion gap: 14 (ref 5–15)
BUN: 22 mg/dL (ref 8–23)
CO2: 25 mmol/L (ref 22–32)
Calcium: 9.4 mg/dL (ref 8.9–10.3)
Chloride: 100 mmol/L (ref 98–111)
Creatinine, Ser: 0.62 mg/dL (ref 0.44–1.00)
GFR, Estimated: >60 mL/min (ref >60)
Glucose, Bld: 81 mg/dL (ref 70–99)
Potassium: 4 mmol/L (ref 3.5–5.1)
Sodium: 139 mmol/L (ref 135–145)

## 2024-01-16 NOTE — Patient Instructions (Addendum)
Your procedure is scheduled on: Tuesday, February 4 Report to the Registration Desk on the 1st floor of the [facility] To find out your arrival time, please call [phone] between 1PM - 3PM on: Monday, February 3 If your arrival time is 6:00 am, do not arrive before that time as the [facility] entrance doors do not open until 6:00 am.  REMEMBER: Instructions that are not followed completely may result in serious medical risk, up to and including death; or upon the discretion of your surgeon and anesthesiologist your surgery may need to be rescheduled.  Do not eat food after midnight the night before surgery.  No gum chewing or hard candies.  You may however, drink CLEAR liquids up to 2 hours before you are scheduled to arrive for your surgery. Do not drink anything within 2 hours of your scheduled arrival time.  Clear liquids include: - water  - apple juice without pulp - gatorade (not RED colors) - black coffee or tea (Do NOT add milk or creamers to the coffee or tea) Do NOT drink anything that is not on this list.  One week prior to surgery: starting January 28 Stop aspirin and Anti-inflammatories (NSAIDS) such as Advil, Aleve, Ibuprofen, Motrin, Naproxen, Naprosyn and Aspirin based products such as Excedrin, Goody's Powder, BC Powder. Stop ANY OVER THE COUNTER supplements until after surgery.  You may however, continue to take Tylenol if needed for pain up until the day of surgery.  Continue taking all of your other prescription medications up until the day of surgery.  ON THE DAY OF SURGERY ONLY TAKE THESE MEDICATIONS WITH SIPS OF WATER:  famotidine (PEPCID)  gabapentin (NEURONTIN)  propranolol (INDERAL)  venlafaxine XR (EFFEXOR-XR)   No Alcohol for 24 hours before or after surgery.  No Smoking including e-cigarettes for 24 hours before surgery.  No chewable tobacco products for at least 6 hours before surgery.  No nicotine patches on the day of surgery.  Do not  use any "recreational" drugs for at least a week (preferably 2 weeks) before your surgery.  Please be advised that the combination of cocaine and anesthesia may have negative outcomes, up to and including death. If you test positive for cocaine, your surgery will be cancelled.  On the morning of surgery brush your teeth with toothpaste and water, you may rinse your mouth with mouthwash if you wish. Do not swallow any toothpaste or mouthwash.  Use CHG Soap as directed on instruction sheet.  Do not wear jewelry, make-up, hairpins, clips or nail polish.  For welded (permanent) jewelry: bracelets, anklets, waist bands, etc.  Please have this removed prior to surgery.  If it is not removed, there is a chance that hospital personnel will need to cut it off on the day of surgery.  Do not wear lotions, powders, or perfumes.   Do not shave body hair from the neck down 48 hours before surgery.  Contact lenses, hearing aids and dentures may not be worn into surgery.  Do not bring valuables to the hospital. [facility] is not responsible for any missing/lost belongings or valuables.   Notify your doctor if there is any change in your medical condition (cold, fever, infection).  Wear comfortable clothing (specific to your surgery type) to the hospital.  After surgery, you can help prevent lung complications by doing breathing exercises.  Take deep breaths and cough every 1-2 hours.   If you are being discharged the day of surgery, you will not be allowed  to drive home. You will need a responsible individual to drive you home and stay with you for 24 hours after surgery.   If you are taking public transportation, you will need to have a responsible individual with you.  Please call the Pre-admissions Testing Dept. at [phone] if you have any questions about these instructions.  Surgery Visitation Policy:  Patients having surgery or a procedure may have two visitors.  Children under the  age of 66 must have an adult with them who is not the patient.  Temporary Visitor Restrictions Due to increasing cases of flu, RSV and COVID-19: Children ages 3 and under will not be able to visit patients in [facility] hospitals under most circumstances.      Preparing for Surgery with CHLORHEXIDINE GLUCONATE (CHG) Soap  Chlorhexidine Gluconate (CHG) Soap  o An antiseptic cleaner that kills germs and bonds with the skin to continue killing germs even after washing  o Used for showering the night before surgery and morning of surgery  Before surgery, you can play an important role by reducing the number of germs on your skin.  CHG (Chlorhexidine gluconate) soap is an antiseptic cleanser which kills germs and bonds with the skin to continue killing germs even after washing.  Please do not use if you have an allergy to CHG or antibacterial soaps. If your skin becomes reddened/irritated stop using the CHG.  1. Shower the NIGHT BEFORE SURGERY and the MORNING OF SURGERY with CHG soap.  2. If you choose to wash your hair, wash your hair first as usual with your normal shampoo.  3. After shampooing, rinse your hair and body thoroughly to remove the shampoo.  4. Use CHG as you would any other liquid soap. You can apply CHG directly to the skin and wash gently with a scrungie or a clean washcloth.  5. Apply the CHG soap to your body only from the neck down. Do not use on open wounds or open sores. Avoid contact with your eyes, ears, mouth, and genitals (private parts). Wash face and genitals (private parts) with your normal soap.  6. Wash thoroughly, paying special attention to the area where your surgery will be performed.  7. Thoroughly rinse your body with warm water.  8. Do not shower/wash with your normal soap after using and rinsing off the CHG soap.  9. Pat yourself dry with a clean towel.  10. Wear clean pajamas to bed the night before surgery.  12. Place clean sheets on  your bed the night of your first shower and do not sleep with pets.  13. Shower again with the CHG soap on the day of surgery prior to arriving at the hospital.  14. Do not apply any deodorants/lotions/powders.  15. Please wear clean clothes to the hospital.

## 2024-01-23 ENCOUNTER — Ambulatory Visit: Payer: BC Managed Care – PPO | Admitting: Anesthesiology

## 2024-01-23 ENCOUNTER — Ambulatory Visit
Admission: RE | Admit: 2024-01-23 | Discharge: 2024-01-23 | Disposition: A | Discharge status: Home / Self Care | Payer: BC Managed Care – PPO | Attending: Neurosurgery | Admitting: Neurosurgery

## 2024-01-23 ENCOUNTER — Other Ambulatory Visit: Payer: Self-pay

## 2024-01-23 ENCOUNTER — Encounter: Payer: Self-pay | Admitting: Neurosurgery

## 2024-01-23 ENCOUNTER — Encounter
Admission: RE | Disposition: A | Discharge status: Home / Self Care | Payer: Self-pay | Source: Home / Self Care | Attending: Neurosurgery

## 2024-01-23 ENCOUNTER — Ambulatory Visit: Payer: BC Managed Care – PPO | Admitting: Urgent Care

## 2024-01-23 DIAGNOSIS — G5603 Carpal tunnel syndrome, bilateral upper limbs: Secondary | ICD-10-CM | POA: Diagnosis present

## 2024-01-23 DIAGNOSIS — Z79899 Other long term (current) drug therapy: Secondary | ICD-10-CM | POA: Diagnosis not present

## 2024-01-23 DIAGNOSIS — G5601 Carpal tunnel syndrome, right upper limb: Secondary | ICD-10-CM | POA: Insufficient documentation

## 2024-01-23 DIAGNOSIS — Z01818 Encounter for other preprocedural examination: Secondary | ICD-10-CM

## 2024-01-23 HISTORY — PX: CARPAL TUNNEL RELEASE: SHX101

## 2024-01-23 SURGERY — CARPAL TUNNEL RELEASE
Anesthesia: General | Laterality: Right

## 2024-01-23 MED ORDER — MIDAZOLAM HCL 2 MG/2ML IJ SOLN
INTRAMUSCULAR | Status: DC | PRN
  Administered 2024-01-23: 2 mg via INTRAVENOUS

## 2024-01-23 MED ORDER — CEFAZOLIN SODIUM-DEXTROSE 2-4 GM/100ML-% IV SOLN
INTRAVENOUS | Status: AC
  Filled 2024-01-23: qty 100

## 2024-01-23 MED ORDER — FENTANYL CITRATE (PF) 100 MCG/2ML IJ SOLN
25.0000 ug | INTRAMUSCULAR | Status: DC | PRN
Start: 2024-01-23 — End: 2024-01-23

## 2024-01-23 MED ORDER — LIDOCAINE HCL 1 % IJ SOLN
INTRAMUSCULAR | Status: DC | PRN
  Administered 2024-01-23: 4 mL

## 2024-01-23 MED ORDER — OXYCODONE HCL 5 MG PO TABS: 5.0000 mg | ORAL_TABLET | Freq: Once | ORAL | Status: DC | PRN

## 2024-01-23 MED ORDER — PROPOFOL 10 MG/ML IV BOLUS
INTRAVENOUS | Status: AC
  Filled 2024-01-23: qty 20

## 2024-01-23 MED ORDER — PROPOFOL 1000 MG/100ML IV EMUL
INTRAVENOUS | Status: AC
  Filled 2024-01-23: qty 100

## 2024-01-23 MED ORDER — CHLORHEXIDINE GLUCONATE 0.12 % MT SOLN
15.0000 mL | Freq: Once | OROMUCOSAL | Status: AC
  Administered 2024-01-23: 15 mL via OROMUCOSAL

## 2024-01-23 MED ORDER — OXYCODONE HCL 5 MG/5ML PO SOLN: 5.0000 mg | Freq: Once | ORAL | Status: DC | PRN

## 2024-01-23 MED ORDER — CHLORHEXIDINE GLUCONATE 0.12 % MT SOLN
OROMUCOSAL | Status: AC
  Filled 2024-01-23: qty 15

## 2024-01-23 MED ORDER — LACTATED RINGERS IV SOLN
INTRAVENOUS | Status: DC
Start: 2024-01-23 — End: 2024-01-23

## 2024-01-23 MED ORDER — PROPOFOL 500 MG/50ML IV EMUL
INTRAVENOUS | Status: DC | PRN
  Administered 2024-01-23 (×2): 30 mg via INTRAVENOUS
  Administered 2024-01-23: 50 mg via INTRAVENOUS
  Administered 2024-01-23: 40 mg via INTRAVENOUS

## 2024-01-23 MED ORDER — ACETAMINOPHEN 10 MG/ML IV SOLN
1000.0000 mg | Freq: Once | INTRAVENOUS | Status: DC | PRN
  Administered 2024-01-23: 1000 mg via INTRAVENOUS

## 2024-01-23 MED ORDER — FENTANYL CITRATE (PF) 100 MCG/2ML IJ SOLN
INTRAMUSCULAR | Status: AC
  Filled 2024-01-23: qty 2

## 2024-01-23 MED ORDER — ONDANSETRON HCL 4 MG/2ML IJ SOLN: 4.0000 mg | Freq: Once | INTRAMUSCULAR | Status: DC | PRN

## 2024-01-23 MED ORDER — 0.9 % SODIUM CHLORIDE (POUR BTL) OPTIME
TOPICAL | Status: DC | PRN
  Administered 2024-01-23: 500 mL

## 2024-01-23 MED ORDER — CEFAZOLIN IN SODIUM CHLORIDE 3-0.9 GM/100ML-% IV SOLN
3.0000 g | Freq: Once | INTRAVENOUS | Status: AC
Start: 2024-01-23 — End: 2024-01-23
  Administered 2024-01-23: 3 g via INTRAVENOUS
  Filled 2024-01-23: qty 100

## 2024-01-23 MED ORDER — HYDROCODONE-ACETAMINOPHEN 5-325 MG PO TABS: 1.0000 | ORAL_TABLET | ORAL | 0 refills | Status: AC | PRN

## 2024-01-23 MED ORDER — ACETAMINOPHEN 10 MG/ML IV SOLN
INTRAVENOUS | Status: AC
  Filled 2024-01-23: qty 100

## 2024-01-23 MED ORDER — MIDAZOLAM HCL 2 MG/2ML IJ SOLN
INTRAMUSCULAR | Status: AC
  Filled 2024-01-23: qty 2

## 2024-01-23 MED ORDER — ASPIRIN 81 MG PO TBEC: 81.0000 mg | DELAYED_RELEASE_TABLET | Freq: Every morning | ORAL | 12 refills | Status: DC

## 2024-01-23 MED ORDER — LIDOCAINE HCL (PF) 1 % IJ SOLN
INTRAMUSCULAR | Status: AC
  Filled 2024-01-23: qty 30

## 2024-01-23 MED ORDER — FENTANYL CITRATE (PF) 100 MCG/2ML IJ SOLN
INTRAMUSCULAR | Status: DC | PRN
  Administered 2024-01-23: 50 ug via INTRAVENOUS

## 2024-01-23 MED ORDER — ORAL CARE MOUTH RINSE: 15.0000 mL | Freq: Once | OROMUCOSAL | Status: AC

## 2024-01-23 SURGICAL SUPPLY — 21 items
BNDG ADH 1X3 SHEER STRL LF (GAUZE/BANDAGES/DRESSINGS) ×1 IMPLANT
BNDG ADH 2 X3.75 FABRIC TAN LF (GAUZE/BANDAGES/DRESSINGS) IMPLANT
BRUSH SCRUB EZ 4% CHG (MISCELLANEOUS) ×1 IMPLANT
CHLORAPREP W/TINT 26 (MISCELLANEOUS) ×1 IMPLANT
COVER PROBE FLX POLY STRL (MISCELLANEOUS) ×1 IMPLANT
DERMABOND ADVANCED .7 DNX12 (GAUZE/BANDAGES/DRESSINGS) ×1 IMPLANT
DRAPE EXTREMITY 106X87X128.5 (DRAPES) ×1 IMPLANT
DRAPE IMP U-DRAPE 54X76 (DRAPES) ×1 IMPLANT
DRAPE SHEET LG 3/4 BI-LAMINATE (DRAPES) ×1 IMPLANT
GLOVE BIOGEL PI IND STRL 8 (GLOVE) ×1 IMPLANT
GLOVE SRG 8 PF TXTR STRL LF DI (GLOVE) ×1 IMPLANT
GLOVE SURG SYN 7.5 E (GLOVE) ×1 IMPLANT
GLOVE SURG SYN 7.5 PF PI (GLOVE) ×1 IMPLANT
GOWN SRG XL LVL 3 NONREINFORCE (GOWNS) ×1 IMPLANT
KIT TURNOVER KIT A (KITS) ×1 IMPLANT
NDL HYPO 25X1 1.5 SAFETY (NEEDLE) ×1 IMPLANT
NEEDLE HYPO 25X1 1.5 SAFETY (NEEDLE) ×1 IMPLANT
NS IRRIG 500ML POUR BTL (IV SOLUTION) ×1 IMPLANT
PACK BASIN MINOR ARMC (MISCELLANEOUS) ×1 IMPLANT
STOCKINETTE IMPERVIOUS 9X36 MD (GAUZE/BANDAGES/DRESSINGS) ×1 IMPLANT
ULTRAGLIDE CTR (BLADE) ×1 IMPLANT

## 2024-01-23 NOTE — Interval H&P Note (Signed)
 History and Physical Interval Note:  01/23/2024 6:45 AM  Tamara Dodson  has presented today for surgery, with the diagnosis of G56.01 right carpal tunnel syndrome.  The various methods of treatment have been discussed with the patient and family. After consideration of risks, benefits and other options for treatment, the patient has consented to  Procedure(s): RIGHT CARPAL TUNNEL RELEASE WITH ULTRASOUND GUIDANCE, POSSIBLE CONVERT TO OPEN (Right) as a surgical intervention.  The patient's history has been reviewed, patient examined, no change in status, stable for surgery.  I have reviewed the patient's chart and labs.  Questions were answered to the patient's satisfaction.    Heart and lungs clear   Tamara Dodson

## 2024-01-23 NOTE — Discharge Instructions (Addendum)

## 2024-01-23 NOTE — Anesthesia Postprocedure Evaluation (Signed)
 Anesthesia Post Note  Patient: Tamara Dodson  Procedure(s) Performed: RIGHT CARPAL TUNNEL RELEASE WITH ULTRASOUND GUIDANCE, POSSIBLE CONVERT TO OPEN (Right)  Patient location during evaluation: PACU Anesthesia Type: General Level of consciousness: awake and alert Pain management: pain level controlled Vital Signs Assessment: post-procedure vital signs reviewed and stable Respiratory status: spontaneous breathing, nonlabored ventilation, respiratory function stable and patient connected to nasal cannula oxygen Cardiovascular status: blood pressure returned to baseline and stable Postop Assessment: no apparent nausea or vomiting Anesthetic complications: no   No notable events documented.   Last Vitals:  Vitals:   01/23/24 0745 01/23/24 0800  BP: 118/67 118/68  Pulse: 65 64  Resp: 11 13  Temp: (!) 36.4 C   SpO2: 99% 100%    Last Pain:  Vitals:   01/23/24 0800  TempSrc:   PainSc: Asleep                 [name]

## 2024-01-23 NOTE — Anesthesia Preprocedure Evaluation (Signed)
 Anesthesia Evaluation  Patient identified by MRN, date of birth, ID band Patient awake  General Assessment Comment:  Documented difficult airway at last c-spine surgery. Airway note at that time mentions easy mask ventilation, and Grade II view with Mcgrath blade, one attempt. Patient herself was never told she was a difficult airway  Reviewed: Allergy  & Precautions, NPO status , Patient's Chart, lab work & pertinent test results  History of Anesthesia Complications (+) DIFFICULT AIRWAY and history of anesthetic complications  Airway Mallampati: II  TM Distance: >3 FB Neck ROM: Limited    Dental no notable dental hx. (+) Teeth Intact   Pulmonary neg pulmonary ROS, neg sleep apnea, neg COPD, Patient abstained from smoking.Not current smoker   Pulmonary exam normal breath sounds clear to auscultation       Cardiovascular Exercise Tolerance: Good METShypertension, Pt. on medications (-) CAD and (-) Past MI (-) dysrhythmias  Rhythm:Regular Rate:Normal - Systolic murmurs    Neuro/Psych  Headaches  Neuromuscular disease  negative psych ROS   GI/Hepatic ,GERD  Medicated and Controlled,,(+)     (-) substance abuse    Endo/Other  neg diabetes  Class 3 obesity  Renal/GU negative Renal ROS     Musculoskeletal   Abdominal   Peds  Hematology   Anesthesia Other Findings Past Medical History: No date: Anemia No date: Carpal tunnel syndrome of right wrist No date: Celiac disease/sprue No date: Cirrhosis (HCC)     Comment:  seeing Dr Babara No date: DJD (degenerative joint disease) No date: Erythrocytosis No date: Essential hypertension No date: Fatty liver 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:  left No date: GERD (gastroesophageal reflux disease) No date: Gluten intolerance 07/18/2019: Iron overload No date: 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 No date: Lumbago No date: Lymphedema of lower extremity No date: Migraines No date:  Mitral valve insufficiency No date: Morbid obesity (HCC) No date: Neuropathy of both feet No date: Nonalcoholic fatty liver disease No date: Osteoarthritis No date: Portal venous hypertension (HCC) No date: Radiculopathy of cervical region No date: Vertigo  Reproductive/Obstetrics                             Anesthesia Physical Anesthesia Plan  ASA: 3  Anesthesia Plan: General   Post-op Pain Management: Minimal or no pain anticipated and Ofirmev  IV (intra-op)*   Induction: Intravenous  PONV Risk Score and Plan: 3 and Propofol  infusion, TIVA, Ondansetron , Dexamethasone , Midazolam  and Treatment may vary due to age or medical condition  Airway Management Planned: Nasal Cannula and Natural Airway  Additional Equipment: None  Intra-op Plan:   Post-operative Plan:   Informed Consent: I have reviewed the patients History and Physical, chart, labs and discussed the procedure including the risks, benefits and alternatives for the proposed anesthesia with the patient or authorized representative who has indicated his/her understanding and acceptance.     Dental advisory given  Plan Discussed with: CRNA and Surgeon  Anesthesia Plan Comments: (Discussed risks of anesthesia with patient, including possibility of difficulty with spontaneous ventilation under anesthesia necessitating airway intervention, PONV, and rare risks such as cardiac or respiratory or neurological events, and allergic reactions. Discussed the role of CRNA in patient's perioperative care. Patient understands. Patient informed about increased incidence of above perioperative risk due to high BMI. Patient understands.  )       Anesthesia Quick Evaluation

## 2024-01-23 NOTE — Op Note (Signed)
 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  Findings: Severe compression of the median nerve at the transverse carpal ligament  Preoperative Diagnosis: Hospital Problem List as of 01/23/2024          Priority Resolved POA     Unprioritized     * (Principal) Right carpal tunnel syndrome   Yes     Bilateral carpal tunnel syndrome   Yes     Postoperative Diagnosis: same   EBL: Minimal IVF: See anesthesia report Drains: none Disposition:Stable to PACU Complications: none  No foley catheter was placed.   Preoperative Note: patient with a history of progressive right median neuropathy with hand weakness refractory to conservative management.  They had tried rest, padding, and watchful waiting but had continued progressive symptoms.  Given the progression of her median neuropathy plan was made for median nerve decompression  Risk of surgery is discussed and include: Infection, bleeding, wound healing issues, pillar pain nerve injury, pain, failure to relieve the symptoms, need for further surgery.  Procedure:  1) right sided carpal tunnel decompression with ultrasound guidance   Procedure: After obtaining informed consent, the patient taken to the operating room, placed in supine position, monitored anesthesia care was induced.  They were given preoperative antibiotics.  Prepped and draped in the usual fashion.  Comprehensive timeout was performed verifying the patient's name, MRN, planned procedure.  An ultrasound was used with a sterile probe.  We used this to mark out our safety points including the interval between the ulnar artery and median nerve.  We identified the motor branch as well as the first sensory branch which were both in safe position.  We also identified the vascular arcade which was in safe positioning as well.  At this point we placed a block with Marcaine  without epinephrine .  We  blocked the skin where the incision would be, the superficial sensory median nerve, as well as the transverse carpal ligament.  Under ultrasound guidance we utilized the local anesthetic to perform a hydrodissection of the nerve from the transverse carpal ligament.  We then prepped the sonex ultra CTR knife on the back table while the anesthetic set in.  We performed a small linear incision approximately 2 to 3 mm.  We then utilized a Statistician under ultrasound guidance to identify the underside of the transverse carpal ligament.  The fat and connective tissue was dissected off the underside.  We could feel that this was quite thickened and calcified.  Causing severe compression.  Once we had a clear tract we then placed the ultrasound-guided knife into the incision and advanced it through the carpal tunnel.  We verified the safety zones including the median nerve which did not significantly cross over the knife.  We are able to see the first sensory branch which was not crossing the knife.  The artery was also in a safe place.  At this point we divided the transverse carpal ligament under ultrasound guidance, to get a full release it took 2 passes.  The nerve relaxed laterally and was well decompressed.  After the 2 passes we placed a [place] 4 into the wound and were able to feel a complete dissection of the transverse carpal ligament.  We then irrigated, we got meticulous hemostasis.  Skin glue was placed on the incision and a Band-Aid was placed on top once this was dried.  No immediate complications.  Sponge and pattie counts were  correct at the end of the procedure.   I performed the procedure without an assistant surgeon  Penne MICAEL Sharps, MD/MSCR

## 2024-01-23 NOTE — Transfer of Care (Signed)
 Immediate Anesthesia Transfer of Care Note  Patient: Tamara Dodson  Procedure(s) Performed: RIGHT CARPAL TUNNEL RELEASE WITH ULTRASOUND GUIDANCE, POSSIBLE CONVERT TO OPEN (Right)  Patient Location: PACU  Anesthesia Type:General  Level of Consciousness: awake, alert , and oriented  Airway & Oxygen Therapy: Patient Spontanous Breathing and Patient connected to face mask oxygen  Post-op Assessment: Report given to RN, Post -op Vital signs reviewed and stable, and Patient moving all extremities  Post vital signs: Reviewed and stable  Last Vitals:  Vitals Value Taken Time  BP 118/67 01/23/24 0745  Temp    Pulse 66 01/23/24 0746  Resp 10 01/23/24 0746  SpO2 99 % 01/23/24 0746  Vitals shown include unfiled device data.  Last Pain:  Vitals:   01/23/24 0623  TempSrc: Temporal  PainSc: 6          Complications: No notable events documented.

## 2024-01-24 ENCOUNTER — Encounter: Payer: Self-pay | Admitting: Neurosurgery

## 2024-01-29 ENCOUNTER — Ambulatory Visit (INDEPENDENT_AMBULATORY_CARE_PROVIDER_SITE_OTHER): Payer: BC Managed Care – PPO | Admitting: Otolaryngology

## 2024-01-29 ENCOUNTER — Encounter (INDEPENDENT_AMBULATORY_CARE_PROVIDER_SITE_OTHER): Payer: Self-pay | Admitting: Otolaryngology

## 2024-01-29 VITALS — BP 102/69 | HR 60

## 2024-01-29 DIAGNOSIS — K900 Celiac disease: Secondary | ICD-10-CM

## 2024-01-29 DIAGNOSIS — R1310 Dysphagia, unspecified: Secondary | ICD-10-CM | POA: Diagnosis not present

## 2024-01-29 DIAGNOSIS — K219 Gastro-esophageal reflux disease without esophagitis: Secondary | ICD-10-CM | POA: Diagnosis not present

## 2024-01-29 DIAGNOSIS — Z9889 Other specified postprocedural states: Secondary | ICD-10-CM

## 2024-01-29 NOTE — Progress Notes (Signed)
ENT Progress Note:  Update 01/29/24   Discussed the use of AI scribe software for clinical note transcription with the patient, who gave verbal consent to proceed.  History of Present Illness   Tamara Dodson is a 63 year old female with a history of ACDF procedures x 3 who is here for f/u after MBS/esophagram. She was seen initially for trouble with swallowing.   She experiences ongoing swallowing difficulties, particularly with dry foods, large pills, or foods without sauce. Certain foods tend to get stuck, although she can eat most foods without significant issues. Tolerating regular diet currently.  A recent swallow study showed no significant abnormalities, although there was some slowing of barium and minor reflux observed during the esophageal phase of swallowing. She is currently taking Pepcid and using Reflux Gourmet to manage her symptoms.  She has a history of celiac disease and follows a gluten-free diet, although she occasionally consumes small amounts of gluten. She experiences bloating but does not vomit or become severely ill from gluten exposure. She has previously undergone an upper endoscopy, which confirmed her celiac disease diagnosis.      Records Reviewed Initial Evaluation 12/16.24 Reason for Consult: dysphagia    HPI: Discussed the use of AI scribe software for clinical note transcription with the patient, who gave verbal consent to proceed.  History of Present Illness   The patient is a 25 yoF, with a history of three ACDF neck surgeries, presents with progressive dysphagia. The first surgery was performed on the left side of the neck, followed by a right-sided approach, and then another left-sided surgery 08/2023. The exact timeline of these surgeries is unclear, but the most recent one was performed on September 16th, 2024 and there is a mention of another ACDF procedure in 2022 in the records. The patient started experiencing swallowing difficulties after the first  surgery, with food 'getting caught' on the left side. This issue has progressively worsened, and after the third surgery, the patient reports that the problem has extended to the right side as well.  The patient describes the sensation of food getting stuck, regardless of the size of the bite or how well it is chewed. This issue is now occurring with every meal and with medication intake. The patient reports having to cough up food 2-3 times a day due to this issue, leading to a fear of eating, especially when alone. This has resulted in a weight loss of approximately 16-17 pounds.  The patient has not had any swallow studies to date. They report a history of heartburn or reflux, for which they take Tums. The patient also mentions numbness in the fingertips, which they attribute to a pinched nerve in the shoulder. This issue is being managed by Neurology.  The patient also reports a sensation of a lump on the right side of the neck, which they believe is due to scar tissue from the surgeries. They have not noticed any changes in their voice or shortness of breath. The patient has not made any significant dietary changes, but they have shifted towards softer foods and cooked vegetables. They struggle with meats and even small pieces of [place] or celery can get stuck.     Records Reviewed:  Hx of left C6-7 ACDF 09/04/23 and another ACDF 2022 - both on the left and another one on the right   NSG office note 11/23/23 She was doing well at her last visit with improvement in her numbness. She still had some numbness in  3rd through 5th finger of left hand on palmar aspect.    She has intermittent left sided neck pain with intermittent pain at her incision. Also with increased numbness left hand and she is dropping things. Numbness in left hand is constant. She also has  intermittent numbness in right hand.    She is still having trouble swallowing- she gets choked on foods frequently. No vocal issues.    COCO SHARPNACK is doing reasonable s/p above surgery. Numbness in left hand is worse and constant. Still having some swallowing issues as well- she chokes on food frequently.    Treatment options reviewed with patient and following plan made:    - Will get cervical xrays on her way out. Will message her with results.  - EMG/NCS of bilateral upper extremities. Orders to [facility].  - Referral to ENT for swallowing issues [facility]).  - Follow up as scheduled in 6 months with Dr. Myer Haff, but will set up phone visit to review EMG and regroup.     Oncology note 10/03/23 HISTORY OF PRESENTING ILLNESS:  Tamara Dodson is a 63 y.o. 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  Chronic Onset, duration since 2016.  She does not smoke.  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     # Heterozygous H63D mutation.  She has a history of iron deficiency after hip replacement and she took oral iron supplementation which was discontinued.  09/24/2019 liver biopsy showed moderate parenchymal and sinusoidal siderosis, patient heterozygous for H63D gene.  Septal fibrosis with areas suggestive of early bridging fibrosis [at least stage II fibrosis]. Focal and minimal steatosis with no evidence of steatohepatitis.  Total NAFLD score 1 of 8. --Scattered iron granules are identified on the HE sections. The iron stain highlights moderate staining in sinusoidal and periportal parenchymal patterns of distribution   Lost follow up with me after her visit in September 2020 and restablished care 03/17/2021 per recommendation of gastroenterology and the neurosurgeon Dr. Marcell Barlow   Elevated Ferritin in the context of liver fibrosis- phlebotomy   02/24/2021 s/p ACDF  C3-5   07/06/22 MRI abdomen with and without contrast  1.  Normal liver morphology. No gross evidence of hepatic iron deposition. Please see addendum for full iron quantification results. 2.  Mild splenomegaly.  0.4 mg Fe / g dry weight of liver (Normal <1.8 mg / g)    Intermittent neuropathy of feet and mouth, migraine and vertigo   INTERVAL HISTORY JUDIANNE SEIPLE is a 63 y.o. female who has above history reviewed by me today presents for follow up visit for management of heterozygous hemochromatosis mutation.  Liver fibrosis During the interval, patient has had cervical myelopathy surgeries, left shoulder surgeries. She was previously recommend to establish with GI and prefers to defer due to above surgeries.         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    Lumbago    Lymphedema of lower extremity    Migraines    Mitral valve insufficiency    Morbid obesity (HCC)    Neuropathy of both feet    Nonalcoholic fatty liver disease    Osteoarthritis    Portal venous hypertension (HCC)    Radiculopathy of cervical region    Vertigo     Past Surgical History:  Procedure Laterality Date   ANTERIOR CERVICAL DECOMP/DISCECTOMY FUSION N/A 02/24/2021   Procedure: ANTERIOR CERVICAL DECOMPRESSION/DISCECTOMY FUSION 2 LEVELS C3-5;  Surgeon: Venetia Night, MD;  Location: [facility];  Service: Neurosurgery;  Laterality: N/A;   ANTERIOR CERVICAL DECOMP/DISCECTOMY FUSION N/A 01/26/2022   Procedure: C5-6 ANTERIOR CERVICAL DISCECTOMY & FUSION (GLOBUS HEDRON);  Surgeon: Venetia Night, MD;  Location: [facility];  Service: Neurosurgery;   Laterality: N/A;   ANTERIOR CERVICAL DECOMP/DISCECTOMY FUSION N/A 09/04/2023   Procedure: C6-7 ANTERIOR CERVICAL DISCECTOMY AND FUSION (HEDRON);  Surgeon: Venetia Night, MD;  Location: [facility];  Service: Neurosurgery;  Laterality: N/A;   APPENDECTOMY     CARPAL TUNNEL RELEASE Right 01/23/2024   Procedure: RIGHT CARPAL TUNNEL RELEASE WITH ULTRASOUND GUIDANCE, POSSIBLE CONVERT TO OPEN;  Surgeon: Lovenia Kim, MD;  Location: [facility];  Service: Neurosurgery;  Laterality: Right;   CHOLECYSTECTOMY  1983   COLONOSCOPY  08/22/2013   COLONOSCOPY N/A 03/22/2023   Procedure: COLONOSCOPY;  Surgeon: Toledo, Boykin Nearing, MD;  Location: [facility];  Service: Gastroenterology;  Laterality: N/A;   ESOPHAGOGASTRODUODENOSCOPY N/A 01/25/2021   Procedure: ESOPHAGOGASTRODUODENOSCOPY (EGD);  Surgeon: Toledo, Boykin Nearing, MD;  Location: [facility];  Service: Gastroenterology;  Laterality: N/A;  C-19 test on 01/22/2021 Requesting to be the last case due to her transportation   ESOPHAGOGASTRODUODENOSCOPY N/A 03/22/2023   Procedure: ESOPHAGOGASTRODUODENOSCOPY (EGD);  Surgeon: Toledo, Boykin Nearing, MD;  Location: [facility];  Service: Gastroenterology;  Laterality: N/A;   KNEE ARTHROPLASTY Right 09/21/2015   Procedure: COMPUTER ASSISTED TOTAL KNEE ARTHROPLASTY;  Surgeon: Donato Heinz, MD;  Location: [facility];  Service: Orthopedics;  Laterality: Right;   KNEE ARTHROSCOPY Right 09/14/2011   SHOULDER ARTHROSCOPY Left 04/11/2022   Procedure: Left shoulder arthroscopic regeneten patch, biceps tenodesis, subacromial decompression;  Surgeon: Signa Kell, MD;  Location: [facility];  Service: Orthopedics;  Laterality: Left;   SHOULDER ARTHROSCOPY WITH OPEN ROTATOR CUFF REPAIR Left 08/11/2022   Procedure: Left revision shoulder arthroscopic rotator cuff repair and extensive glenohumeral debridement;  Surgeon: Signa Kell, MD;  Location[facility];  Service: Orthopedics;  Laterality: Left;  needs potassium  draw   TONSILLECTOMY     TOTAL HIP ARTHROPLASTY Left 02/14/2011   TOTAL HIP ARTHROPLASTY Right 11/17/2017   Procedure: TOTAL HIP ARTHROPLASTY;  Surgeon: Donato Heinz, MD;  Location: [facility];  Service: Orthopedics;  Laterality: Right;   TOTAL KNEE ARTHROPLASTY Left 02/18/2013   WISDOM TOOTH EXTRACTION      Family History  Problem Relation Age of Onset   Breast cancer Mother        20's    Social History:  reports that she has never smoked. She has never been exposed to tobacco smoke. She has never used smokeless tobacco. She reports that she does not drink alcohol and does not use drugs.  Allergies:  Allergies  Allergen Reactions   Gluten Meal Diarrhea    bloating    Medications: I have reviewed the patient's current medications.  The PMH, PSH, Medications, Allergies,  and SH were reviewed and updated.  ROS: Constitutional: Negative for fever, weight loss and weight gain. Cardiovascular: Negative for chest pain and dyspnea on exertion. Respiratory: Is not experiencing shortness of breath at rest. Gastrointestinal: Negative for nausea and vomiting. Neurological: Negative for headaches. Psychiatric: The patient is not nervous/anxious  Blood pressure 102/69, pulse 60, SpO2 97%.  PHYSICAL EXAM:  Exam: General: Well-developed, well-nourished Communication and Voice: slightly raspy Respiratory Respiratory effort: Equal inspiration and expiration without stridor Cardiovascular Peripheral Vascular: Warm extremities with equal color/perfusion Eyes: No nystagmus with equal extraocular motion bilaterally Neuro/Psych/Balance: Patient oriented to person, place, and time; Appropriate mood and affect; Gait is intact with no imbalance; Cranial nerves I-XII are intact Head and Face Inspection: Normocephalic and atraumatic without mass or lesion Palpation: Facial skeleton intact without bony stepoffs Salivary Glands: No mass or tenderness Facial Strength: Facial motility  symmetric and full bilaterally ENT Pinna: External ear intact and fully developed External canal: Canal is patent with intact skin Tympanic Membrane: Clear and mobile External Nose: No scar or anatomic deformity Lips, Teeth, and gums: Mucosa and teeth intact and viable TMJ: No pain to palpation with full mobility Oral cavity/oropharynx: No erythema or exudate, no lesions present Neck Neck and Trachea: Midline trachea without mass or lesion Thyroid: No mass or nodularity Lymphatics: No lymphadenopathy  Procedure: none Studies Reviewed: C-spine XR 10/10/23 MPRESSION: C3-5 and C5-7 anterior cervical fusions without complicating feature.  MRI c-spine 03/01/23 IMPRESSION: 1. 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 Moderate left foraminal stenosis. Mild spinal stenosis. 2. Anterior cervical fusion from C3 through C6. At C4-5 there is moderate-severe left foraminal stenosis. At C5-6 there is moderate bilateral foraminal stenosis. 3. No acute osseous injury of the cervical spine  MBS/esophagram 01/03/24 FINDINGS: Swallowing: Appears normal. No vestibular penetration or aspiration seen.   Pharynx: Unremarkable.   Esophagus: Normal appearance.   Esophageal motility: Mild dysmotility with occasional retropulsion of barium within the esophagus.   Hiatal Hernia: None.   Gastroesophageal reflux: None visualized.   Ingested 13mm barium tablet: Passed normally   Other: None.   IMPRESSION: Mild dysmotility with occasional retropulsion of barium within the esophagus. Otherwise normal study.  MBS SLP report  History of Present Illness JESYCA WEISENBURGER is a 63 yo female presenting for OP MBS with complaints of progressive dysphagia s/p three ACDF procedures. She reports solids "getting caught" on the R side resulting in coughing episodes and eventual regurgitation. She states this happens regardless of attempting  soft solids or masticating boluses completely. 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 Scheduled to complete a barium esophagram following MBS. PMH includes anemia, celiac disease, cirrhosis, DJD, GERD, HTN, lumbago, lymphedema of LE, migraines, neuropathy, osteoarthritis, vertigo, ACDF (2022, 2023, 2024)     Clinical Impression Pt presents with a functional oropharyngeal swallow. Thin liquids resulted in transient penetration, which is considered a normal variant of swallowing. There is no significant pharyngeal residue even with regular texture solids. Consider further esophageal assessment given reports of globus and regurgitation. Recommend she continue her regular diet as able with thin liquids. She may benefit from the use of extra sauces to soften food further. Take pills whole with purees to ease transit. Discussed results of MBS with pt and provided education.  EGD 03/22/23   Assessment/Plan: Encounter Diagnoses  Name Primary?   Dysphagia, unspecified type Yes   History  of neck surgery    Chronic GERD      Assessment and Plan    Dysphagia following ACDF x 3 worse over time Chronic dysphagia following three anterior cervical spine surgeries, with progressive worsening. Symptoms include food getting stuck, particularly on the right side, leading to frequent choking episodes. No prior swallow studies. Physical and scope exams were unremarkable. Differential diagnosis includes scar tissue formation and potential esophageal motility disorder or GERD LPR and UES/PES/CP dysfunction, no pooling of secretions in pyriforms, had findings c/w GERD on flexible scope exam. Discussed dysphagia post-spine surgery and the need for a swallow study. Explained that scar tissue from multiple surgeries could be contributing to symptoms. Discussed potential outcomes and the role of reflux management in  improving symptoms. - Order swallow study MBS esophagram - Prescribe Pepcid 20 mg twice daily - Provide education on reflux management and dietary modifications - Recommend Reflux Gourmet for post-meal use - Schedule follow-up after swallow study results  Chronic Gastroesophageal Reflux Disease (GERD) Chronic heartburn managed with Tums. Potential contribution to dysphagia due to chronic reflux causing CP muscle hypertrophy and tightness. No current prescription medications for reflux. Discussed the role of reflux in exacerbating dysphagia and the benefits of medical management with Pepcid and dietary changes. - Prescribe Pepcid twice daily - Provide education on reflux management and dietary modifications - Recommend Reflux Gourmet for post-meal use  Cervical Radiculopathy Pinched nerve and herniated disc with associated numbness in the shoulder and fingertips. Previous epidural provided temporary relief. Scheduled for neurology consultation next month for further evaluation, possibly including an EMG. Discussed the need for further evaluation to determine the cause of persistent symptoms and potential treatment options. - Continue with neurology consultation for further evaluation and potential EMG  Follow-up - Schedule follow-up after swallow study results - Continue with neurology consultation next month.     Update 01/29/24 Assessment and Plan    Swallowing Difficulty Reports difficulty swallowing, particularly with dry foods and large pills. Recent swallow study showed no significant abnormalities, though some dysmotility and GERD noted on esophagram. Reassured that there is no evidence of narrowing or stricture requiring surgical intervention. Three ACDF procedures may contribute to symptoms. Discussed potential benefits of continuing reflux medications and possibly trying swallow therapy if symptoms impact eating. Patient expressed concern about the swallow study's use of soft foods,  which may not reflect her typical difficulties. Discussed strategies like crushing pills and eating moist foods. - Continue Pepcid and Reflux Gourmet - Consider referral for swallow therapy if symptoms significantly impact eating  Chronic Gastroesophageal Reflux Disease (GERD) Evidence of reflux noted during the swallow study. Emphasized the importance of continuing reflux medications to manage symptoms and prevent potential complications. Reflux may contribute to swallowing difficulties. - Continue Pepcid 20 mg BID and Reflux Gourmet  Celiac Disease Follows a gluten-free diet. Consuming gluten in moderation causes bloating but not severe symptoms like vomiting. Unlikely to be the primary cause of swallowing difficulties. - Continue gluten-free diet  Follow-up - Follow up as needed.        Ashok Croon, MD Otolaryngology [facility] Phone: [phone] Fax: [phone]    01/29/2024, 9:31 AM

## 2024-02-05 ENCOUNTER — Ambulatory Visit (INDEPENDENT_AMBULATORY_CARE_PROVIDER_SITE_OTHER): Payer: BC Managed Care – PPO | Admitting: Physician Assistant

## 2024-02-05 VITALS — BP 126/76 | Temp 98.0°F | Ht 71.0 in | Wt 297.0 lb

## 2024-02-05 DIAGNOSIS — G5601 Carpal tunnel syndrome, right upper limb: Secondary | ICD-10-CM

## 2024-02-05 DIAGNOSIS — G5603 Carpal tunnel syndrome, bilateral upper limbs: Secondary | ICD-10-CM

## 2024-02-05 DIAGNOSIS — Z09 Encounter for follow-up examination after completed treatment for conditions other than malignant neoplasm: Secondary | ICD-10-CM

## 2024-02-05 NOTE — H&P (View-Only) (Signed)
   REFERRING PHYSICIAN:  [name], Md [address] [place],  [place] [address]  DOS: 01/23/24, right ultrasound guided carpal tunnel release  HISTORY OF PRESENT ILLNESS: Tamara Dodson is 2 weeks status post right ultrasound guided carpal tunnel release. Overall, she is doing well.  She states that all of the symptoms she is having in her right hand have stopped.  She is no longer having numbness and tingling.  She did add that she had quite a bit of bruising after surgery.  She is eager to have her left hand complete as she continues to have nighttime wakings on the left side with numbness and tingling present often worse when she is on her phone or bending her wrist.  This is continued despite conservative management.  PHYSICAL EXAMINATION:  NEUROLOGICAL:  General: In no acute distress.   Awake, alert, oriented to person, place, and time.  Pupils equal round and reactive to light.  Facial tone is symmetric.     Strength:  Has some mild loss of volume in her bilateral thenar pads, at least 4+ out of 5 strength in her median intrinsic musculature.   Decreased sensation predominantly in the median distribution.  Positive carpal compression test of the left side.  Positive Phalen's and reverse Phalen's.  Positive Tinel at the left wrist.   Incision c/d/I.  Ecchymosis present proximal and distal to the incision.  Imaging:  No imaging completed today.  Previous EMG was reviewed which did show electrodiagnostic evidence of carpal tunnel syndrome.  Assessment / Plan: Tamara Dodson is 2 weeks status post right ultrasound guided carpal tunnel release.  She has a history of cervical radiculopathy. Overall, she is doing well.  She states that all of the symptoms she is having in her right hand have stopped.   She is no longer having numbness and tingling.  She did add that she had quite a bit of bruising after surgery.  She is eager to have her left hand complete as she  continues to have nighttime wakings on the left side with numbness and tingling present often worse when she is on her phone or bending her wrist.She is likely continuing to experience a double crush type phenomenon in her left hand as well.  She would like to go forward with a carpal tunnel decompression on her left hand as well, we will look to get this booked as soon as possible.   Advised to contact the office if any questions or concerns arise.   Joan Flores PA-C Dept of Neurosurgery

## 2024-02-05 NOTE — Progress Notes (Signed)
   REFERRING PHYSICIAN:  [name], Md [address] [place],  [place] [address]  DOS: 01/23/24, right ultrasound guided carpal tunnel release  HISTORY OF PRESENT ILLNESS: Tamara Dodson is 2 weeks status post right ultrasound guided carpal tunnel release. Overall, she is doing well.  She states that all of the symptoms she is having in her right hand have stopped.  She is no longer having numbness and tingling.  She did add that she had quite a bit of bruising after surgery.  She is eager to have her left hand complete as she continues to have nighttime wakings on the left side with numbness and tingling present often worse when she is on her phone or bending her wrist.  This is continued despite conservative management.  PHYSICAL EXAMINATION:  NEUROLOGICAL:  General: In no acute distress.   Awake, alert, oriented to person, place, and time.  Pupils equal round and reactive to light.  Facial tone is symmetric.     Strength:  Has some mild loss of volume in her bilateral thenar pads, at least 4+ out of 5 strength in her median intrinsic musculature.   Decreased sensation predominantly in the median distribution.  Positive carpal compression test of the left side.  Positive Phalen's and reverse Phalen's.  Positive Tinel at the left wrist.   Incision c/d/I.  Ecchymosis present proximal and distal to the incision.  Imaging:  No imaging completed today.  Previous EMG was reviewed which did show electrodiagnostic evidence of carpal tunnel syndrome.  Assessment / Plan: Tamara Dodson is 2 weeks status post right ultrasound guided carpal tunnel release.  She has a history of cervical radiculopathy. Overall, she is doing well.  She states that all of the symptoms she is having in her right hand have stopped.   She is no longer having numbness and tingling.  She did add that she had quite a bit of bruising after surgery.  She is eager to have her left hand complete as she  continues to have nighttime wakings on the left side with numbness and tingling present often worse when she is on her phone or bending her wrist.She is likely continuing to experience a double crush type phenomenon in her left hand as well.  She would like to go forward with a carpal tunnel decompression on her left hand as well, we will look to get this booked as soon as possible.   Advised to contact the office if any questions or concerns arise.   Joan Flores PA-C Dept of Neurosurgery

## 2024-02-05 NOTE — Addendum Note (Signed)
 Addended by: Sharlot Gowda on: 02/05/2024 06:41 PM   Modules accepted: Orders

## 2024-02-06 ENCOUNTER — Telehealth: Payer: Self-pay

## 2024-02-06 NOTE — Telephone Encounter (Signed)
 Planned surgery: Left carpal tunnel release with ultrasound guidance, possible convert to open    Surgery date: 03/05/24 at [facility]: [address] [place], [place] [address]) - you will find out your arrival time the business day before your surgery.   Pre-op appointment at [facility] Pre-admit Testing: we will call you with a date/time for this. If you are scheduled for an in person appointment, Pre-admit Testing is located on the first floor of the [facility] building, [address] [facility] [facility] [address] [address], [address]. Please bring all prescriptions in the original prescription bottles to your appointment. During this appointment, they will advise you which medications you can take the morning of surgery, and which medications you will need to hold for surgery. Labs (such as blood work, EKG) may be done at your pre-op appointment. You are not required to fast for these labs. Should you need to change your pre-op appointment, please call Pre-admit testing at [phone].    Blood thinners:   Aspirin 81mg :   stop aspirin 7 days prior,  resume 7 days after    How to contact us:  If you have any questions/concerns before or after surgery, you can reach [place] at [phone], or you can send a mychart message. We can be reached by phone or mychart 8am-4pm, Monday-Friday.  *Please note: Calls after 4pm are forwarded to a third party answering service. Mychart messages are not routinely monitored during evenings, weekends, and holidays. Please call our office to contact the answering service for urgent concerns during non-business hours.   If you have FMLA/disability paperwork, please drop it off or fax it to [phone], attention Patty.   Appointments/FMLA & disability paperwork: Joycelyn Rua, & Flonnie Hailstone Registered Nurse/Surgery scheduler: Royston Cowper Medical Assistants: Nash Mantis Physician Assistants: Joan Flores, PA-C, Manning Charity, PA-C & Drake Leach,  PA-C Surgeons: Venetia Night, MD & Ernestine Mcmurray, MD

## 2024-02-07 ENCOUNTER — Other Ambulatory Visit: Payer: Self-pay

## 2024-02-07 DIAGNOSIS — Z01818 Encounter for other preprocedural examination: Secondary | ICD-10-CM

## 2024-02-22 ENCOUNTER — Other Ambulatory Visit: Payer: Self-pay

## 2024-02-22 ENCOUNTER — Encounter
Admission: RE | Admit: 2024-02-22 | Discharge: 2024-02-22 | Disposition: A | Discharge status: Home / Self Care | Payer: BC Managed Care – PPO | Source: Ambulatory Visit | Attending: Neurosurgery | Admitting: Neurosurgery

## 2024-02-22 VITALS — BP 122/75 | HR 64 | Temp 97.9°F | Resp 12 | Ht 71.0 in | Wt 303.0 lb

## 2024-02-22 DIAGNOSIS — I10 Essential (primary) hypertension: Secondary | ICD-10-CM | POA: Diagnosis not present

## 2024-02-22 DIAGNOSIS — Z01812 Encounter for preprocedural laboratory examination: Secondary | ICD-10-CM | POA: Diagnosis present

## 2024-02-22 HISTORY — DX: Carpal tunnel syndrome, left upper limb: G56.02

## 2024-02-22 LAB — BASIC METABOLIC PANEL
Anion gap: 8 (ref 5–15)
BUN: 17 mg/dL (ref 8–23)
CO2: 27 mmol/L (ref 22–32)
Calcium: 9 mg/dL (ref 8.9–10.3)
Chloride: 101 mmol/L (ref 98–111)
Creatinine, Ser: 0.65 mg/dL (ref 0.44–1.00)
GFR, Estimated: >60 mL/min (ref >60)
Glucose, Bld: 110 mg/dL — ABNORMAL HIGH (ref 70–99)
Potassium: 3.8 mmol/L (ref 3.5–5.1)
Sodium: 136 mmol/L (ref 135–145)

## 2024-02-22 LAB — CBC
HCT: 48.8 % — ABNORMAL HIGH (ref 36.0–46.0)
Hemoglobin: 16.2 g/dL — ABNORMAL HIGH (ref 12.0–15.0)
MCH: 32.3 pg (ref 26.0–34.0)
MCHC: 33.2 g/dL (ref 30.0–36.0)
MCV: 97.4 fL (ref 80.0–100.0)
Platelets: 171 10*3/uL (ref 150–400)
RBC: 5.01 MIL/uL (ref 3.87–5.11)
RDW: 13.3 % (ref 11.5–15.5)
WBC: 9.9 10*3/uL (ref 4.0–10.5)
nRBC: 0 % (ref 0.0–0.2)

## 2024-02-22 NOTE — Patient Instructions (Signed)
 Your procedure is scheduled on: Tuesday, March 18 Report to the Registration Desk on the 1st floor of the [facility] To find out your arrival time, please call [phone] between 1PM - 3PM on: Monday, March 17 If your arrival time is 6:00 am, do not arrive before that time as the [facility] entrance doors do not open until 6:00 am.  REMEMBER: Instructions that are not followed completely may result in serious medical risk, up to and including death; or upon the discretion of your surgeon and anesthesiologist your surgery may need to be rescheduled.  Do not eat food after midnight the night before surgery.  No gum chewing or hard candies.  You may however, drink CLEAR liquids up to 2 hours before you are scheduled to arrive for your surgery. Do not drink anything within 2 hours of your scheduled arrival time.  Clear liquids include: - water  - apple juice without pulp - gatorade (not RED colors) - black coffee or tea (Do NOT add milk or creamers to the coffee or tea) Do NOT drink anything that is not on this list.  One week prior to surgery: starting March 11 Stop aspirin and  Anti-inflammatories (NSAIDS) such as Advil, Aleve, Ibuprofen, Motrin, Naproxen, Naprosyn and Aspirin based products such as Excedrin, Goody's Powder, BC Powder. Stop ANY OVER THE COUNTER supplements until after surgery.  You may however, continue to take Tylenol if needed for pain up until the day of surgery.  Continue taking all of your other prescription medications up until the day of surgery.  ON THE DAY OF SURGERY ONLY TAKE THESE MEDICATIONS WITH SIPS OF WATER:  famotidine (PEPCID)  gabapentin (NEURONTIN)  propranolol (INDERAL)  venlafaxine XR (EFFEXOR-XR)   No Alcohol for 24 hours before or after surgery.  No Smoking including e-cigarettes for 24 hours before surgery.  No chewable tobacco products for at least 6 hours before surgery.  No nicotine patches on the day of surgery.  Do not use  any "recreational" drugs for at least a week (preferably 2 weeks) before your surgery.  Please be advised that the combination of cocaine and anesthesia may have negative outcomes, up to and including death. If you test positive for cocaine, your surgery will be cancelled.  On the morning of surgery brush your teeth with toothpaste and water, you may rinse your mouth with mouthwash if you wish. Do not swallow any toothpaste or mouthwash.  Use CHG Soap as directed on instruction sheet.  Do not wear jewelry, make-up, hairpins, clips or nail polish.  For welded (permanent) jewelry: bracelets, anklets, waist bands, etc.  Please have this removed prior to surgery.  If it is not removed, there is a chance that hospital personnel will need to cut it off on the day of surgery.  Do not wear lotions, powders, or perfumes.   Do not shave body hair from the neck down 48 hours before surgery.  Contact lenses, hearing aids and dentures may not be worn into surgery.  Do not bring valuables to the hospital. [facility] is not responsible for any missing/lost belongings or valuables.   Notify your doctor if there is any change in your medical condition (cold, fever, infection).  Wear comfortable clothing (specific to your surgery type) to the hospital.  After surgery, you can help prevent lung complications by doing breathing exercises.  Take deep breaths and cough every 1-2 hours.   If you are being discharged the day of surgery, you will not be  allowed to drive home. You will need a responsible individual to drive you home and stay with you for 24 hours after surgery.   If you are taking public transportation, you will need to have a responsible individual with you.  Please call the Pre-admissions Testing Dept. at [phone] if you have any questions about these instructions.  Surgery Visitation Policy:  Patients having surgery or a procedure may have two visitors.  Children under the age  of 47 must have an adult with them who is not the patient.  Temporary Visitor Restrictions Due to increasing cases of flu, RSV and COVID-19: Children ages 66 and under will not be able to visit patients in [facility] hospitals under most circumstances.      Preparing for Surgery with CHLORHEXIDINE GLUCONATE (CHG) Soap  Chlorhexidine Gluconate (CHG) Soap  o An antiseptic cleaner that kills germs and bonds with the skin to continue killing germs even after washing  o Used for showering the night before surgery and morning of surgery  Before surgery, you can play an important role by reducing the number of germs on your skin.  CHG (Chlorhexidine gluconate) soap is an antiseptic cleanser which kills germs and bonds with the skin to continue killing germs even after washing.  Please do not use if you have an allergy to CHG or antibacterial soaps. If your skin becomes reddened/irritated stop using the CHG.  1. Shower the NIGHT BEFORE SURGERY and the MORNING OF SURGERY with CHG soap.  2. If you choose to wash your hair, wash your hair first as usual with your normal shampoo.  3. After shampooing, rinse your hair and body thoroughly to remove the shampoo.  4. Use CHG as you would any other liquid soap. You can apply CHG directly to the skin and wash gently with a scrungie or a clean washcloth.  5. Apply the CHG soap to your body only from the neck down. Do not use on open wounds or open sores. Avoid contact with your eyes, ears, mouth, and genitals (private parts). Wash face and genitals (private parts) with your normal soap.  6. Wash thoroughly, paying special attention to the area where your surgery will be performed.  7. Thoroughly rinse your body with warm water.  8. Do not shower/wash with your normal soap after using and rinsing off the CHG soap.  9. Pat yourself dry with a clean towel.  10. Wear clean pajamas to bed the night before surgery.  12. Place clean sheets on your  bed the night of your first shower and do not sleep with pets.  13. Shower again with the CHG soap on the day of surgery prior to arriving at the hospital.  14. Do not apply any deodorants/lotions/powders.  15. Please wear clean clothes to the hospital.

## 2024-02-28 ENCOUNTER — Other Ambulatory Visit: Payer: Self-pay | Admitting: Orthopedic Surgery

## 2024-03-04 ENCOUNTER — Encounter: Payer: BC Managed Care – PPO | Admitting: Neurosurgery

## 2024-03-05 ENCOUNTER — Ambulatory Visit: Payer: Self-pay | Admitting: Urgent Care

## 2024-03-05 ENCOUNTER — Other Ambulatory Visit: Payer: Self-pay

## 2024-03-05 ENCOUNTER — Inpatient Hospital Stay: Admission: RE | Admit: 2024-03-05 | Source: Ambulatory Visit

## 2024-03-05 ENCOUNTER — Encounter
Admission: RE | Disposition: A | Discharge status: Home / Self Care | Payer: Self-pay | Source: Home / Self Care | Attending: Neurosurgery

## 2024-03-05 ENCOUNTER — Ambulatory Visit: Payer: Self-pay

## 2024-03-05 ENCOUNTER — Encounter: Payer: Self-pay | Admitting: Neurosurgery

## 2024-03-05 ENCOUNTER — Ambulatory Visit
Admission: RE | Admit: 2024-03-05 | Discharge: 2024-03-05 | Disposition: A | Discharge status: Home / Self Care | Payer: BC Managed Care – PPO | Attending: Neurosurgery | Admitting: Neurosurgery

## 2024-03-05 DIAGNOSIS — Z01810 Encounter for preprocedural cardiovascular examination: Secondary | ICD-10-CM | POA: Diagnosis not present

## 2024-03-05 DIAGNOSIS — R001 Bradycardia, unspecified: Secondary | ICD-10-CM | POA: Insufficient documentation

## 2024-03-05 DIAGNOSIS — G5602 Carpal tunnel syndrome, left upper limb: Secondary | ICD-10-CM

## 2024-03-05 DIAGNOSIS — G5603 Carpal tunnel syndrome, bilateral upper limbs: Secondary | ICD-10-CM | POA: Diagnosis present

## 2024-03-05 DIAGNOSIS — Z01818 Encounter for other preprocedural examination: Secondary | ICD-10-CM

## 2024-03-05 DIAGNOSIS — I10 Essential (primary) hypertension: Secondary | ICD-10-CM | POA: Diagnosis not present

## 2024-03-05 HISTORY — PX: CARPAL TUNNEL RELEASE: SHX101

## 2024-03-05 SURGERY — CARPAL TUNNEL RELEASE
Anesthesia: General | Site: Wrist | Laterality: Left

## 2024-03-05 MED ORDER — ONDANSETRON HCL 4 MG/2ML IJ SOLN
INTRAMUSCULAR | Status: AC
  Filled 2024-03-05: qty 2

## 2024-03-05 MED ORDER — LIDOCAINE HCL (PF) 1 % IJ SOLN
INTRAMUSCULAR | Status: AC
  Filled 2024-03-05: qty 30

## 2024-03-05 MED ORDER — 0.9 % SODIUM CHLORIDE (POUR BTL) OPTIME
TOPICAL | Status: DC | PRN
  Administered 2024-03-05: 500 mL

## 2024-03-05 MED ORDER — CEFAZOLIN SODIUM-DEXTROSE 2-4 GM/100ML-% IV SOLN
INTRAVENOUS | Status: AC
  Filled 2024-03-05: qty 100

## 2024-03-05 MED ORDER — CEFAZOLIN SODIUM-DEXTROSE 3-4 GM/150ML-% IV SOLN
3.0000 g | Freq: Once | INTRAVENOUS | Status: AC
  Administered 2024-03-05: 3 g via INTRAVENOUS
  Filled 2024-03-05: qty 150

## 2024-03-05 MED ORDER — LIDOCAINE HCL (PF) 2 % IJ SOLN
INTRAMUSCULAR | Status: AC
  Filled 2024-03-05: qty 5

## 2024-03-05 MED ORDER — TRAMADOL HCL 50 MG PO TABS: 50.0000 mg | ORAL_TABLET | Freq: Four times a day (QID) | ORAL | 0 refills | Status: AC | PRN

## 2024-03-05 MED ORDER — MIDAZOLAM HCL 2 MG/2ML IJ SOLN
INTRAMUSCULAR | Status: AC
  Filled 2024-03-05: qty 2

## 2024-03-05 MED ORDER — ORAL CARE MOUTH RINSE: 15.0000 mL | Freq: Once | OROMUCOSAL | Status: AC

## 2024-03-05 MED ORDER — MIDAZOLAM HCL 2 MG/2ML IJ SOLN
INTRAMUSCULAR | Status: DC | PRN
  Administered 2024-03-05: 2 mg via INTRAVENOUS

## 2024-03-05 MED ORDER — PHENYLEPHRINE HCL-NACL 20-0.9 MG/250ML-% IV SOLN
INTRAVENOUS | Status: AC
  Filled 2024-03-05: qty 250

## 2024-03-05 MED ORDER — FENTANYL CITRATE (PF) 100 MCG/2ML IJ SOLN
25.0000 ug | INTRAMUSCULAR | Status: DC | PRN
Start: 2024-03-05 — End: 2024-03-05

## 2024-03-05 MED ORDER — FENTANYL CITRATE (PF) 100 MCG/2ML IJ SOLN
INTRAMUSCULAR | Status: DC | PRN
  Administered 2024-03-05: 25 ug via INTRAVENOUS

## 2024-03-05 MED ORDER — PROPOFOL 500 MG/50ML IV EMUL
INTRAVENOUS | Status: DC | PRN
Start: 2024-03-05 — End: 2024-03-05
  Administered 2024-03-05: 150 ug/kg/min via INTRAVENOUS

## 2024-03-05 MED ORDER — CHLORHEXIDINE GLUCONATE 0.12 % MT SOLN
OROMUCOSAL | Status: AC
Start: 2024-03-05 — End: ?
  Filled 2024-03-05: qty 15

## 2024-03-05 MED ORDER — SEVOFLURANE IN SOLN
RESPIRATORY_TRACT | Status: AC
  Filled 2024-03-05: qty 250

## 2024-03-05 MED ORDER — OXYCODONE HCL 5 MG PO TABS: 5.0000 mg | ORAL_TABLET | Freq: Once | ORAL | Status: DC | PRN

## 2024-03-05 MED ORDER — DEXAMETHASONE SODIUM PHOSPHATE 10 MG/ML IJ SOLN
INTRAMUSCULAR | Status: AC
  Filled 2024-03-05: qty 1

## 2024-03-05 MED ORDER — LIDOCAINE HCL 1 % IJ SOLN
INTRAMUSCULAR | Status: DC | PRN
  Administered 2024-03-05: 2 mL

## 2024-03-05 MED ORDER — PROPOFOL 1000 MG/100ML IV EMUL
INTRAVENOUS | Status: AC
  Filled 2024-03-05: qty 100

## 2024-03-05 MED ORDER — OXYCODONE HCL 5 MG/5ML PO SOLN: 5.0000 mg | Freq: Once | ORAL | Status: DC | PRN

## 2024-03-05 MED ORDER — LACTATED RINGERS IV SOLN: INTRAVENOUS | Status: DC

## 2024-03-05 MED ORDER — CHLORHEXIDINE GLUCONATE 0.12 % MT SOLN
15.0000 mL | Freq: Once | OROMUCOSAL | Status: AC
  Administered 2024-03-05: 15 mL via OROMUCOSAL

## 2024-03-05 MED ORDER — FENTANYL CITRATE (PF) 100 MCG/2ML IJ SOLN
INTRAMUSCULAR | Status: AC
  Filled 2024-03-05: qty 2

## 2024-03-05 SURGICAL SUPPLY — 22 items
BNDG ADH 1X3 SHEER STRL LF (GAUZE/BANDAGES/DRESSINGS) ×1 IMPLANT
BNDG ADH 2 X3.75 FABRIC TAN LF (GAUZE/BANDAGES/DRESSINGS) IMPLANT
BRUSH SCRUB EZ 4% CHG (MISCELLANEOUS) ×1 IMPLANT
CHLORAPREP W/TINT 26 (MISCELLANEOUS) ×1 IMPLANT
COVER PROBE FLX POLY STRL (MISCELLANEOUS) ×1 IMPLANT
DERMABOND ADVANCED .7 DNX12 (GAUZE/BANDAGES/DRESSINGS) ×1 IMPLANT
DRAPE EXTREMITY 106X87X128.5 (DRAPES) ×1 IMPLANT
DRAPE IMP U-DRAPE 54X76 (DRAPES) ×1 IMPLANT
DRAPE SHEET LG 3/4 BI-LAMINATE (DRAPES) ×1 IMPLANT
GLOVE BIOGEL PI IND STRL 8 (GLOVE) ×1 IMPLANT
GLOVE SRG 8 PF TXTR STRL LF DI (GLOVE) ×1 IMPLANT
GLOVE SURG SYN 7.5 E (GLOVE) ×1 IMPLANT
GLOVE SURG SYN 7.5 PF PI (GLOVE) ×1 IMPLANT
GOWN SRG XL LVL 3 NONREINFORCE (GOWNS) ×1 IMPLANT
KIT TURNOVER KIT A (KITS) ×1 IMPLANT
NDL HYPO 25X1 1.5 SAFETY (NEEDLE) ×1 IMPLANT
NEEDLE HYPO 25X1 1.5 SAFETY (NEEDLE) ×1 IMPLANT
NS IRRIG 500ML POUR BTL (IV SOLUTION) ×1 IMPLANT
PACK BASIN MINOR ARMC (MISCELLANEOUS) ×1 IMPLANT
PAD PREP 24X41 OB/GYN DISP (PERSONAL CARE ITEMS) IMPLANT
STOCKINETTE IMPERVIOUS 9X36 MD (GAUZE/BANDAGES/DRESSINGS) ×1 IMPLANT
ULTRAGLIDE CTR (BLADE) ×1 IMPLANT

## 2024-03-05 NOTE — Op Note (Signed)
 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  Findings: Severe compression of the median nerve at the transverse carpal ligament  Preoperative Diagnosis:G56.02 left carpal tunnel syndrome  Postoperative Diagnosis: G56.02 left carpal tunnel syndrome   Postoperative Diagnosis: same   EBL: Minimal IVF: See anesthesia report Drains: none Disposition:Stable to PACU Complications: none  No foley catheter was placed.   Preoperative Note: patient with a history of progressive left median neuropathy with hand weakness refractory to conservative management.  They had tried rest, padding, and watchful waiting but had continued progressive symptoms.  Given the progression of her median neuropathy plan was made for median nerve decompression  Risk of surgery is discussed and include: Infection, bleeding, wound healing issues, pillar pain nerve injury, pain, failure to relieve the symptoms, need for further surgery.  Procedure:  1) left sided carpal tunnel decompression with ultrasound guidance   Procedure: After obtaining informed consent, the patient taken to the operating room, placed in supine position, monitored anesthesia care was induced.  They were given preoperative antibiotics.  Prepped and draped in the usual fashion.  Comprehensive timeout was performed verifying the patient's name, MRN, planned procedure.  An ultrasound was used with a sterile probe.  We used this to mark out our safety points including the interval between the ulnar artery and median nerve.  We identified the motor branch as well as the first sensory branch which were both in safe position.  We also identified the vascular arcade which was in safe positioning as well.  At this point we placed a block with Marcaine without epinephrine.  We blocked the skin where the incision would be, the superficial sensory median nerve, as well as the  transverse carpal ligament.  Under ultrasound guidance we utilized the local anesthetic to perform a hydrodissection of the nerve from the transverse carpal ligament.  We then prepped the sonexs ultra CTR knife on the back table while the anesthetic set in.  We performed a small linear incision approximately 2 to 3 mm.  We then utilized a Statistician under ultrasound guidance to identify the underside of the transverse carpal ligament.  The fat and connective tissue was dissected off the underside.  We could feel that this was quite thickened and calcified.  Causing severe compression.  Once we had a clear tract we then placed the ultrasound-guided knife into the incision and advanced it through the carpal tunnel.  We verified the safety zones including the median nerve which did not significantly cross over the knife.  We are able to see the first sensory branch which was not crossing the knife.  The artery was also in a safe place.  At this point we divided the transverse carpal ligament under ultrasound guidance, to get a full release it took 2 passes.  The nerve relaxed laterally and was well decompressed.  After the 2 passes we placed a [place] 4 into the wound and were able to feel a complete dissection of the transverse carpal ligament.  We then irrigated, we got meticulous hemostasis.  Skin glue was placed on the incision and a Band-Aid was placed on top once this was dried.  No immediate complications.  Sponge and pattie counts were correct at the end of the procedure.   I performed the procedure without an assistant surgeon  Lovenia Kim, MD/MSCR

## 2024-03-05 NOTE — Anesthesia Postprocedure Evaluation (Signed)
 Anesthesia Post Note  Patient: Tamara Dodson  Procedure(s) Performed: LEFT CARPAL TUNNEL RELEASE WITH ULTRASOUND GUIDANCE, POSSIBLE CONVERT TO OPEN (Left: Wrist)  Patient location during evaluation: PACU Anesthesia Type: General Level of consciousness: awake and alert Pain management: pain level controlled Vital Signs Assessment: post-procedure vital signs reviewed and stable Respiratory status: spontaneous breathing, nonlabored ventilation, respiratory function stable and patient connected to nasal cannula oxygen Cardiovascular status: blood pressure returned to baseline and stable Postop Assessment: no apparent nausea or vomiting Anesthetic complications: no   No notable events documented.   Last Vitals:  Vitals:   03/05/24 0806 03/05/24 0808  BP: (!) 131/55   Pulse: 63 66  Resp: 14 (!) 21  Temp:    SpO2: 100% 100%    Last Pain:  Vitals:   03/05/24 0808  TempSrc:   PainSc: 0-No pain                 Louie Boston

## 2024-03-05 NOTE — Anesthesia Preprocedure Evaluation (Signed)
 Anesthesia Evaluation  Patient identified by MRN, date of birth, ID band Patient awake  General Assessment Comment:  Documented difficult airway at last c-spine surgery. Airway note at that time mentions easy mask ventilation, and Grade II view with Mcgrath blade, one attempt. Patient herself was never told she was a difficult airway  Reviewed: Allergy & Precautions, NPO status , Patient's Chart, lab work & pertinent test results  History of Anesthesia Complications (+) DIFFICULT AIRWAY and history of anesthetic complications  Airway Mallampati: II  TM Distance: >3 FB Neck ROM: Limited    Dental no notable dental hx. (+) Teeth Intact   Pulmonary neg pulmonary ROS, neg sleep apnea, neg COPD, Patient abstained from smoking.Not current smoker   Pulmonary exam normal breath sounds clear to auscultation       Cardiovascular Exercise Tolerance: Good METShypertension, Pt. on medications (-) CAD and (-) Past MI (-) dysrhythmias  Rhythm:Regular Rate:Normal - Systolic murmurs    Neuro/Psych  Headaches  Neuromuscular disease  negative psych ROS   GI/Hepatic ,GERD  Medicated and Controlled,,(+)     (-) substance abuse    Endo/Other  neg diabetes  Class 3 obesity  Renal/GU negative Renal ROS     Musculoskeletal   Abdominal   Peds  Hematology   Anesthesia Other Findings Past Medical History: No date: Anemia No date: Carpal tunnel syndrome of right wrist No date: Celiac disease/sprue No date: Cirrhosis (HCC)     Comment:  seeing Dr Cathie Hoops No date: DJD (degenerative joint disease) No date: Erythrocytosis No date: Essential hypertension No date: Fatty liver 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:  left No date: GERD (gastroesophageal reflux disease) No date: Gluten intolerance 07/18/2019: Iron overload No date: 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 No date: Lumbago No date: Lymphedema of lower extremity No date: Migraines No date:  Mitral valve insufficiency No date: Morbid obesity (HCC) No date: Neuropathy of both feet No date: Nonalcoholic fatty liver disease No date: Osteoarthritis No date: Portal venous hypertension (HCC) No date: Radiculopathy of cervical region No date: Vertigo  Reproductive/Obstetrics                              Anesthesia Physical Anesthesia Plan  ASA: 3  Anesthesia Plan: General   Post-op Pain Management: Minimal or no pain anticipated and Ofirmev IV (intra-op)*   Induction: Intravenous  PONV Risk Score and Plan: 3 and Propofol infusion, TIVA, Ondansetron, Dexamethasone, Midazolam and Treatment may vary due to age or medical condition  Airway Management Planned: Nasal Cannula and Natural Airway  Additional Equipment: None  Intra-op Plan:   Post-operative Plan:   Informed Consent: I have reviewed the patients History and Physical, chart, labs and discussed the procedure including the risks, benefits and alternatives for the proposed anesthesia with the patient or authorized representative who has indicated his/her understanding and acceptance.     Dental advisory given  Plan Discussed with: CRNA and Surgeon  Anesthesia Plan Comments: (Patient consented for risks of anesthesia including but not limited to:  - adverse reactions to medications - damage to eyes, teeth, lips or other oral mucosa - nerve damage due to positioning  - sore throat or hoarseness - Damage to heart, brain, nerves, lungs, other parts of body or loss of life  Patient voiced understanding and assent.)        Anesthesia Quick Evaluation

## 2024-03-05 NOTE — Interval H&P Note (Signed)
 History and Physical Interval Note:  03/05/2024 7:08 AM  Tamara Dodson  has presented today for surgery, with the diagnosis of G56.02 left carpal tunnel syndrome.  The various methods of treatment have been discussed with the patient and family. After consideration of risks, benefits and other options for treatment, the patient has consented to  Procedure(s): LEFT CARPAL TUNNEL RELEASE WITH ULTRASOUND GUIDANCE, POSSIBLE CONVERT TO OPEN (Left) as a surgical intervention.  The patient's history has been reviewed, patient examined, no change in status, stable for surgery.  I have reviewed the patient's chart and labs.  Questions were answered to the patient's satisfaction.    Heart and lungs clear.   Lovenia Kim

## 2024-03-05 NOTE — Transfer of Care (Signed)
 Immediate Anesthesia Transfer of Care Note  Patient: Tamara Dodson  Procedure(s) Performed: LEFT CARPAL TUNNEL RELEASE WITH ULTRASOUND GUIDANCE, POSSIBLE CONVERT TO OPEN (Left: Wrist)  Patient Location: PACU  Anesthesia Type:MAC  Level of Consciousness: awake, drowsy, and patient cooperative  Airway & Oxygen Therapy: Patient Spontanous Breathing and Patient connected to face mask oxygen  Post-op Assessment: Report given to RN, Post -op Vital signs reviewed and stable, and Patient moving all extremities X 4  Post vital signs: Reviewed and stable  Last Vitals:  Vitals Value Taken Time  BP 122/50 03/05/24 0751  Temp 37.7 C 03/05/24 0751  Pulse 66 03/05/24 0751  Resp 13 03/05/24 0751  SpO2 100 % 03/05/24 0751    Last Pain:  Vitals:   03/05/24 0643  TempSrc: Oral  PainSc: 9          Complications: No notable events documented.

## 2024-03-05 NOTE — Discharge Instructions (Signed)
 Your surgeon has performed an operation on the nerves in your upper extremity. Many times, patients feel better immediately after surgery and can "overdo it." Even if you feel well, it is important that you follow these activity guidelines. If you do not let your nerve heal properly from the surgery, you can increase the chance of return of your symptoms. The following are instructions to help in your recovery once you have been discharged from the hospital.  It is normal for your nerves to feel increased "tingling" after surgery or a sensation that the nerve is "waking up".  This generally will resolve in a short period of time, within 1 to 2 weeks.  * It is ok to take NSAIDs after surgery.  Activity    Increase physical activity slowly as tolerated.  Taking short walks is encouraged, but avoid strenuous exercise. Do not jog, run, bicycle, lift weights, or participate in any other exercises unless specifically allowed by your doctor. Avoid prolonged sitting, including car rides.  For the first few days after surgery, avoid any use of the extremity more than a glass of water or for eating.  Specific instructions were given to you based off of your specific procedure.  You should not drive until no longer taking any new pain medication.  You may shower three days after your surgery.  After showering, lightly dab your incision dry. Do not take a tub bath or go swimming for 3 weeks, or until approved by your doctor at your follow-up appointment.  If you smoke, we strongly recommend that you quit.  Smoking has been proven to interfere with normal healing in your back and will dramatically reduce the success rate of your surgery. Please contact [facility] (800-QUIT-NOW) and use the resources at www.[facility].com for assistance in stopping smoking.  Surgical Incision   If you have a dressing on your incision, you may remove it three days after your surgery. Keep your incision area clean and dry.  Your  sutures are under your skin and your wound is covered with surgical glue.  The glue should begin to peel away within about a week.   Diet            You may return to your usual diet. Be sure to stay hydrated.  When to Contact us  Although your surgery and recovery will likely be uneventful, you may have some residual numbness, aches, and pains in your back and/or legs. This is normal and should improve in the next few weeks.  However, should you experience any of the following, contact us immediately: New numbness or weakness Pain that is progressively getting worse, and is not relieved by your pain medications or rest Bleeding, redness, swelling, pain, or drainage from surgical incision Chills or flu-like symptoms Fever greater than 101.0 F (38.3 C) Problems with bowel or bladder functions Difficulty breathing or shortness of breath Warmth, tenderness, or swelling in your calf  Contact Information How to contact us:  If you have any questions/concerns before or after surgery, you can reach [place] at [phone], or you can send a mychart message. We can be reached by phone or mychart 8am-4pm, Monday-Friday.  *Please note: Calls after 4pm are forwarded to a third party answering service. Mychart messages are not routinely monitored during evenings, weekends, and holidays. Please call our office to contact the answering service for urgent concerns during non-business hours.

## 2024-03-06 ENCOUNTER — Encounter: Payer: Self-pay | Admitting: Neurosurgery

## 2024-03-06 ENCOUNTER — Other Ambulatory Visit: Payer: Self-pay

## 2024-03-06 ENCOUNTER — Encounter
Admission: RE | Admit: 2024-03-06 | Discharge: 2024-03-06 | Disposition: A | Discharge status: Home / Self Care | Source: Ambulatory Visit | Attending: Orthopedic Surgery | Admitting: Orthopedic Surgery

## 2024-03-06 ENCOUNTER — Telehealth: Payer: Self-pay | Admitting: Neurosurgery

## 2024-03-06 VITALS — Ht 71.0 in | Wt 297.0 lb

## 2024-03-06 DIAGNOSIS — Z01810 Encounter for preprocedural cardiovascular examination: Secondary | ICD-10-CM

## 2024-03-06 DIAGNOSIS — I10 Essential (primary) hypertension: Secondary | ICD-10-CM

## 2024-03-06 NOTE — Patient Instructions (Addendum)
 Your procedure is scheduled [id number] March 25  Report to the Registration Desk on the 1st floor of the [facility] To find out your arrival time, please call [phone] between 1PM - 3PM on:  Monday March 24  If your arrival time is 6:00 am, do not arrive before that time as the [facility] entrance doors do not open until 6:00 am.  REMEMBER: Instructions that are not followed completely may result in serious medical risk, up to and including death; or upon the discretion of your surgeon and anesthesiologist your surgery may need to be rescheduled.  Do not eat food after midnight the night before surgery.  No gum chewing or hard candies.  You may however, drink CLEAR liquids up to 2 hours before you are scheduled to arrive for your surgery. Do not drink anything within 2 hours of your scheduled arrival time.  Clear liquids include: - water  - apple juice without pulp - gatorade (not RED colors) - black coffee or tea (Do NOT add milk or creamers to the coffee or tea) Do NOT drink anything that is not on this list.   In addition, your doctor has ordered for you to drink the provided:  Ensure Pre-Surgery Clear Carbohydrate Drink  Drinking this carbohydrate drink up to two hours before surgery helps to reduce insulin resistance and improve patient outcomes. Please complete drinking 2 hours before scheduled arrival time.  One week prior to surgery: STARTING TUESDAY MARCH 18  Stop Anti-inflammatories (NSAIDS) such as Advil, Aleve, Ibuprofen, Motrin, Naproxen, Naprosyn and Aspirin based products such as Excedrin, Goody's Powder, BC Powder. Stop ANY OVER THE COUNTER supplements until after surgery.  You may however, continue to take Tylenol if needed for pain up until the day of surgery.  **Follow recommendations regarding stopping blood thinners.** aspirin EC continue but DO NOT TAKE THE MORNING OF SURGERY   Continue taking all of your other prescription medications up until the  day of surgery.  ON THE DAY OF SURGERY ONLY TAKE THESE MEDICATIONS WITH SIPS OF WATER:  propranolol (INDERAL)  famotidine (PEPCID)     No Alcohol for 24 hours before or after surgery.  No Smoking including e-cigarettes for 24 hours before surgery.  No chewable tobacco products for at least 6 hours before surgery.  No nicotine patches on the day of surgery.  Do not use any "recreational" drugs for at least a week (preferably 2 weeks) before your surgery.  Please be advised that the combination of cocaine and anesthesia may have negative outcomes, up to and including death. If you test positive for cocaine, your surgery will be cancelled.  On the morning of surgery brush your teeth with toothpaste and water, you may rinse your mouth with mouthwash if you wish. Do not swallow any toothpaste or mouthwash.  Use CHG Soap as directed on instruction sheet.  Do not wear jewelry, make-up, hairpins, clips or nail polish.  For welded (permanent) jewelry: bracelets, anklets, waist bands, etc.  Please have this removed prior to surgery.  If it is not removed, there is a chance that hospital personnel will need to cut it off on the day of surgery.  Do not wear lotions, powders, or perfumes.   Do not shave body hair from the neck down 48 hours before surgery.  Contact lenses, hearing aids and dentures may not be worn into surgery.  Do not bring valuables to the hospital. [facility] is not responsible for any missing/lost belongings or valuables.  Notify your doctor if there is any change in your medical condition (cold, fever, infection).  Wear comfortable clothing (specific to your surgery type) to the hospital.  After surgery, you can help prevent lung complications by doing breathing exercises.  Take deep breaths and cough every 1-2 hours. Your doctor may order a device called an Incentive Spirometer to help you take deep breaths.  If you are being discharged the day of surgery, you  will not be allowed to drive home. You will need a responsible individual to drive you home and stay with you for 24 hours after surgery.   If you are taking public transportation, you will need to have a responsible individual with you.  Please call the Pre-admissions Testing Dept. at [phone] if you have any questions about these instructions.  Surgery Visitation Policy:  Patients having surgery or a procedure may have two visitors.  Children under the age of 73 must have an adult with them who is not the patient.  Temporary Visitor Restrictions Due to increasing cases of flu, RSV and COVID-19: Children ages 83 and under will not be able to visit patients in [facility] hospitals under most circumstances.       Preparing for Surgery with CHLORHEXIDINE GLUCONATE (CHG) Soap  Chlorhexidine Gluconate (CHG) Soap  o An antiseptic cleaner that kills germs and bonds with the skin to continue killing germs even after washing  o Used for showering the night before surgery and morning of surgery  Before surgery, you can play an important role by reducing the number of germs on your skin.  CHG (Chlorhexidine gluconate) soap is an antiseptic cleanser which kills germs and bonds with the skin to continue killing germs even after washing.  Please do not use if you have an allergy to CHG or antibacterial soaps. If your skin becomes reddened/irritated stop using the CHG.  1. Shower the NIGHT BEFORE SURGERY and the MORNING OF SURGERY with CHG soap.  2. If you choose to wash your hair, wash your hair first as usual with your normal shampoo.  3. After shampooing, rinse your hair and body thoroughly to remove the shampoo.  4. Use CHG as you would any other liquid soap. You can apply CHG directly to the skin and wash gently with a scrungie or a clean washcloth.  5. Apply the CHG soap to your body only from the neck down. Do not use on open wounds or open sores. Avoid contact with your  eyes, ears, mouth, and genitals (private parts). Wash face and genitals (private parts) with your normal soap.  6. Wash thoroughly, paying special attention to the area where your surgery will be performed.  7. Thoroughly rinse your body with warm water.  8. Do not shower/wash with your normal soap after using and rinsing off the CHG soap.  9. Pat yourself dry with a clean towel.  10. Wear clean pajamas to bed the night before surgery.  11. Place clean sheets on your bed the night of your first shower and do not sleep with pets.  12. Shower again with the CHG soap on the day of surgery prior to arriving at the hospital.  13. Do not apply any deodorants/lotions/powders.  14. Please wear clean clothes to the hospital.     How to Use an Incentive Spirometer  An incentive spirometer is a tool that measures how well you are filling your lungs with each breath. Learning to take long, deep breaths using this tool can  help you keep your lungs clear and active. This may help to reverse or lessen your chance of developing breathing (pulmonary) problems, especially infection. You may be asked to use a spirometer: After a surgery. If you have a lung problem or a history of smoking. After a long period of time when you have been unable to move or be active. If the spirometer includes an indicator to show the highest number that you have reached, your health care provider or respiratory therapist will help you set a goal. Keep a log of your progress as told by your health care provider. What are the risks? Breathing too quickly may cause dizziness or cause you to pass out. Take your time so you do not get dizzy or light-headed. If you are in pain, you may need to take pain medicine before doing incentive spirometry. It is harder to take a deep breath if you are having pain. How to use your incentive spirometer  Sit up on the edge of your bed or on a chair. Hold the incentive spirometer so that  it is in an upright position. Before you use the spirometer, breathe out normally. Place the mouthpiece in your mouth. Make sure your lips are closed tightly around it. Breathe in slowly and as deeply as you can through your mouth, causing the piston or the ball to rise toward the top of the chamber. Hold your breath for 3-5 seconds, or for as long as possible. If the spirometer includes a coach indicator, use this to guide you in breathing. Slow down your breathing if the indicator goes above the marked areas. Remove the mouthpiece from your mouth and breathe out normally. The piston or ball will return to the bottom of the chamber. Rest for a few seconds, then repeat the steps 10 or more times. Take your time and take a few normal breaths between deep breaths so that you do not get dizzy or light-headed. Do this every 1-2 hours when you are awake. If the spirometer includes a goal marker to show the highest number you have reached (best effort), use this as a goal to work toward during each repetition. After each set of 10 deep breaths, cough a few times. This will help to make sure that your lungs are clear. If you have an incision on your chest or abdomen from surgery, place a pillow or a rolled-up towel firmly against the incision when you cough. This can help to reduce pain while taking deep breaths and coughing. General tips When you are able to get out of bed: Walk around often. Continue to take deep breaths and cough in order to clear your lungs. Keep using the incentive spirometer until your health care provider says it is okay to stop using it. If you have been in the hospital, you may be told to keep using the spirometer at home. Contact a health care provider if: You are having difficulty using the spirometer. You have trouble using the spirometer as often as instructed. Your pain medicine is not giving enough relief for you to use the spirometer as told. You have a fever. Get  help right away if: You develop shortness of breath. You develop a cough with bloody mucus from the lungs. You have fluid or blood coming from an incision site after you cough. Summary An incentive spirometer is a tool that can help you learn to take long, deep breaths to keep your lungs clear and active. You may be asked to  use a spirometer after a surgery, if you have a lung problem or a history of smoking, or if you have been inactive for a long period of time. Use your incentive spirometer as instructed every 1-2 hours while you are awake. If you have an incision on your chest or abdomen, place a pillow or a rolled-up towel firmly against your incision when you cough. This will help to reduce pain. Get help right away if you have shortness of breath, you cough up bloody mucus, or blood comes from your incision when you cough. This information is not intended to replace advice given to you by your health care provider. Make sure you discuss any questions you have with your health care provider. Document Revised: 02/24/2020 Document Reviewed: 02/24/2020 Elsevier Patient Education  2023 [employer].

## 2024-03-06 NOTE — Telephone Encounter (Signed)
 I spoke with the pharmacist - it was a glitch on their end. They are processing the script now. I also contacted the answering service supervisor to make sure that in the future, the on call provider is paged for issues like this. I updated the patient and apologized for the issue.

## 2024-03-06 NOTE — Telephone Encounter (Signed)
  Media Information   Document Information  left carpal tunnel release with ultrasound guidance, possible convert to open on 03/05/24  I called the patient this morning, [facility] [address] told the patient that Dr. Katrinka Blazing is not authorized to prescribe controlled medication. She has been with out pain medication since surgery. She is in a lot of pain.

## 2024-03-07 ENCOUNTER — Encounter
Admission: RE | Admit: 2024-03-07 | Discharge: 2024-03-07 | Disposition: A | Discharge status: Home / Self Care | Source: Ambulatory Visit | Attending: Orthopedic Surgery | Admitting: Orthopedic Surgery

## 2024-03-07 DIAGNOSIS — R001 Bradycardia, unspecified: Secondary | ICD-10-CM | POA: Insufficient documentation

## 2024-03-07 DIAGNOSIS — Z01810 Encounter for preprocedural cardiovascular examination: Secondary | ICD-10-CM

## 2024-03-07 DIAGNOSIS — I10 Essential (primary) hypertension: Secondary | ICD-10-CM | POA: Insufficient documentation

## 2024-03-07 DIAGNOSIS — G5602 Carpal tunnel syndrome, left upper limb: Secondary | ICD-10-CM | POA: Diagnosis not present

## 2024-03-11 MED ORDER — LACTATED RINGERS IV SOLN
INTRAVENOUS | Status: DC
Start: 2024-03-11 — End: 2024-03-12

## 2024-03-11 MED ORDER — CEFAZOLIN SODIUM-DEXTROSE 3-4 GM/150ML-% IV SOLN
3.0000 g | INTRAVENOUS | Status: AC
  Administered 2024-03-12: 3 g via INTRAVENOUS
  Filled 2024-03-11: qty 150

## 2024-03-11 MED ORDER — ORAL CARE MOUTH RINSE: 15.0000 mL | Freq: Once | OROMUCOSAL | Status: AC

## 2024-03-11 MED ORDER — CHLORHEXIDINE GLUCONATE 0.12 % MT SOLN
15.0000 mL | Freq: Once | OROMUCOSAL | Status: AC
  Administered 2024-03-12: 15 mL via OROMUCOSAL

## 2024-03-12 ENCOUNTER — Other Ambulatory Visit: Payer: Self-pay

## 2024-03-12 ENCOUNTER — Ambulatory Visit: Admitting: Anesthesiology

## 2024-03-12 ENCOUNTER — Ambulatory Visit
Admission: RE | Admit: 2024-03-12 | Discharge: 2024-03-12 | Disposition: A | Discharge status: Home / Self Care | Attending: Orthopedic Surgery | Admitting: Orthopedic Surgery

## 2024-03-12 ENCOUNTER — Encounter: Payer: Self-pay | Admitting: Orthopedic Surgery

## 2024-03-12 ENCOUNTER — Encounter
Admission: RE | Disposition: A | Discharge status: Home / Self Care | Payer: Self-pay | Source: Home / Self Care | Attending: Orthopedic Surgery

## 2024-03-12 ENCOUNTER — Ambulatory Visit

## 2024-03-12 DIAGNOSIS — E66813 Obesity, class 3: Secondary | ICD-10-CM | POA: Insufficient documentation

## 2024-03-12 DIAGNOSIS — K219 Gastro-esophageal reflux disease without esophagitis: Secondary | ICD-10-CM | POA: Diagnosis not present

## 2024-03-12 DIAGNOSIS — M7502 Adhesive capsulitis of left shoulder: Secondary | ICD-10-CM | POA: Diagnosis present

## 2024-03-12 DIAGNOSIS — I10 Essential (primary) hypertension: Secondary | ICD-10-CM | POA: Diagnosis not present

## 2024-03-12 DIAGNOSIS — Z6841 Body Mass Index (BMI) 40.0 and over, adult: Secondary | ICD-10-CM | POA: Diagnosis not present

## 2024-03-12 DIAGNOSIS — M24012 Loose body in left shoulder: Secondary | ICD-10-CM | POA: Insufficient documentation

## 2024-03-12 DIAGNOSIS — R519 Headache, unspecified: Secondary | ICD-10-CM | POA: Insufficient documentation

## 2024-03-12 HISTORY — PX: EXAM UNDER ANESTHESIA WITH MANIPULATION OF SHOULDER: SHX5817

## 2024-03-12 HISTORY — PX: POSTERIOR LUMBAR FUSION 2 WITH HARDWARE REMOVAL: SHX7297

## 2024-03-12 SURGERY — ARTHROSCOPY, SHOULDER WITH DEBRIDEMENT
Anesthesia: General | Site: Shoulder | Laterality: Left

## 2024-03-12 MED ORDER — FENTANYL CITRATE (PF) 100 MCG/2ML IJ SOLN
INTRAMUSCULAR | Status: AC
  Filled 2024-03-12: qty 2

## 2024-03-12 MED ORDER — BUPIVACAINE HCL (PF) 0.5 % IJ SOLN
INTRAMUSCULAR | Status: AC
  Filled 2024-03-12: qty 30

## 2024-03-12 MED ORDER — FENTANYL CITRATE (PF) 100 MCG/2ML IJ SOLN
INTRAMUSCULAR | Status: DC | PRN
  Administered 2024-03-12: 50 ug via INTRAVENOUS

## 2024-03-12 MED ORDER — FENTANYL CITRATE PF 50 MCG/ML IJ SOSY
PREFILLED_SYRINGE | INTRAMUSCULAR | Status: AC
  Filled 2024-03-12: qty 1

## 2024-03-12 MED ORDER — TRIAMCINOLONE ACETONIDE 40 MG/ML IJ SUSP
INTRAMUSCULAR | Status: AC
  Filled 2024-03-12: qty 2

## 2024-03-12 MED ORDER — PHENYLEPHRINE 80 MCG/ML (10ML) SYRINGE FOR IV PUSH (FOR BLOOD PRESSURE SUPPORT)
PREFILLED_SYRINGE | INTRAVENOUS | Status: AC
  Filled 2024-03-12: qty 10

## 2024-03-12 MED ORDER — LIDOCAINE HCL (PF) 1 % IJ SOLN
INTRAMUSCULAR | Status: AC
  Filled 2024-03-12: qty 30

## 2024-03-12 MED ORDER — ASPIRIN 325 MG PO TBEC: 325.0000 mg | DELAYED_RELEASE_TABLET | Freq: Every day | ORAL | 0 refills | Status: AC

## 2024-03-12 MED ORDER — MIDAZOLAM HCL 2 MG/2ML IJ SOLN
1.0000 mg | INTRAMUSCULAR | Status: AC | PRN
  Administered 2024-03-12 (×2): 1 mg via INTRAVENOUS

## 2024-03-12 MED ORDER — PROPOFOL 10 MG/ML IV BOLUS
INTRAVENOUS | Status: AC
  Filled 2024-03-12: qty 20

## 2024-03-12 MED ORDER — BUPIVACAINE HCL (PF) 0.5 % IJ SOLN
INTRAMUSCULAR | Status: AC
  Filled 2024-03-12: qty 10

## 2024-03-12 MED ORDER — SUCCINYLCHOLINE CHLORIDE 200 MG/10ML IV SOSY
PREFILLED_SYRINGE | INTRAVENOUS | Status: DC | PRN
  Administered 2024-03-12: 160 mg via INTRAVENOUS

## 2024-03-12 MED ORDER — OXYCODONE HCL 5 MG PO TABS: 5.0000 mg | ORAL_TABLET | ORAL | 0 refills | Status: DC | PRN

## 2024-03-12 MED ORDER — CHLORHEXIDINE GLUCONATE 0.12 % MT SOLN
OROMUCOSAL | Status: AC
  Filled 2024-03-12: qty 15

## 2024-03-12 MED ORDER — BUPIVACAINE LIPOSOME 1.3 % IJ SUSP
INTRAMUSCULAR | Status: DC | PRN
  Administered 2024-03-12: 20 mL

## 2024-03-12 MED ORDER — FENTANYL CITRATE PF 50 MCG/ML IJ SOSY
50.0000 ug | PREFILLED_SYRINGE | Freq: Once | INTRAMUSCULAR | Status: AC
  Administered 2024-03-12: 50 ug via INTRAVENOUS

## 2024-03-12 MED ORDER — RINGERS IRRIGATION IR SOLN
Status: DC | PRN
  Administered 2024-03-12: 6000 mL
  Administered 2024-03-12: 3000 mL

## 2024-03-12 MED ORDER — TRIAMCINOLONE ACETONIDE 40 MG/ML IJ SUSP
INTRAMUSCULAR | Status: DC | PRN
  Administered 2024-03-12 (×2): 5 mL

## 2024-03-12 MED ORDER — MIDAZOLAM HCL 2 MG/2ML IJ SOLN
INTRAMUSCULAR | Status: AC
  Filled 2024-03-12: qty 2

## 2024-03-12 MED ORDER — ACETAMINOPHEN 500 MG PO TABS
1000.0000 mg | ORAL_TABLET | Freq: Three times a day (TID) | ORAL | 2 refills | Status: AC
Start: 2024-03-12 — End: 2025-03-12

## 2024-03-12 MED ORDER — SUGAMMADEX SODIUM 500 MG/5ML IV SOLN
INTRAVENOUS | Status: DC | PRN
  Administered 2024-03-12: 200 mg via INTRAVENOUS

## 2024-03-12 MED ORDER — ROCURONIUM BROMIDE 100 MG/10ML IV SOLN
INTRAVENOUS | Status: DC | PRN
Start: 2024-03-12 — End: 2024-03-12
  Administered 2024-03-12: 40 mg via INTRAVENOUS
  Administered 2024-03-12: 20 mg via INTRAVENOUS

## 2024-03-12 MED ORDER — GLYCOPYRROLATE 0.2 MG/ML IJ SOLN
INTRAMUSCULAR | Status: DC | PRN
  Administered 2024-03-12: .4 mg via INTRAVENOUS

## 2024-03-12 MED ORDER — PHENYLEPHRINE HCL-NACL 20-0.9 MG/250ML-% IV SOLN
INTRAVENOUS | Status: DC | PRN
Start: 2024-03-12 — End: 2024-03-12
  Administered 2024-03-12: 20 ug/min via INTRAVENOUS

## 2024-03-12 MED ORDER — BUPIVACAINE HCL (PF) 0.5 % IJ SOLN
INTRAMUSCULAR | Status: DC | PRN
  Administered 2024-03-12: 10 mL

## 2024-03-12 MED ORDER — ONDANSETRON 4 MG PO TBDP: 4.0000 mg | ORAL_TABLET | Freq: Three times a day (TID) | ORAL | 0 refills | Status: DC | PRN

## 2024-03-12 MED ORDER — PROPOFOL 10 MG/ML IV BOLUS
INTRAVENOUS | Status: DC | PRN
  Administered 2024-03-12: 20 mg via INTRAVENOUS
  Administered 2024-03-12: 200 mg via INTRAVENOUS

## 2024-03-12 MED ORDER — KETAMINE HCL 50 MG/5ML IJ SOSY
PREFILLED_SYRINGE | INTRAMUSCULAR | Status: DC | PRN
  Administered 2024-03-12: 50 mg via INTRAVENOUS

## 2024-03-12 MED ORDER — EPHEDRINE 5 MG/ML INJ
INTRAVENOUS | Status: AC
  Filled 2024-03-12: qty 5

## 2024-03-12 MED ORDER — PHENYLEPHRINE 80 MCG/ML (10ML) SYRINGE FOR IV PUSH (FOR BLOOD PRESSURE SUPPORT)
PREFILLED_SYRINGE | INTRAVENOUS | Status: DC | PRN
  Administered 2024-03-12: 160 ug via INTRAVENOUS
  Administered 2024-03-12: 120 ug via INTRAVENOUS
  Administered 2024-03-12: 160 ug via INTRAVENOUS

## 2024-03-12 MED ORDER — DEXAMETHASONE SODIUM PHOSPHATE 10 MG/ML IJ SOLN
INTRAMUSCULAR | Status: DC | PRN
  Administered 2024-03-12: 5 mg via INTRAVENOUS

## 2024-03-12 MED ORDER — PHENYLEPHRINE HCL-NACL 20-0.9 MG/250ML-% IV SOLN
INTRAVENOUS | Status: AC
  Filled 2024-03-12: qty 250

## 2024-03-12 MED ORDER — ONDANSETRON HCL 4 MG/2ML IJ SOLN
INTRAMUSCULAR | Status: AC
  Filled 2024-03-12: qty 2

## 2024-03-12 MED ORDER — KETAMINE HCL 50 MG/5ML IJ SOSY
PREFILLED_SYRINGE | INTRAMUSCULAR | Status: AC
  Filled 2024-03-12: qty 5

## 2024-03-12 MED ORDER — EPHEDRINE SULFATE-NACL 50-0.9 MG/10ML-% IV SOSY
PREFILLED_SYRINGE | INTRAVENOUS | Status: DC | PRN
  Administered 2024-03-12: 15 mg via INTRAVENOUS
  Administered 2024-03-12: 10 mg via INTRAVENOUS

## 2024-03-12 MED ORDER — DEXAMETHASONE SODIUM PHOSPHATE 10 MG/ML IJ SOLN
INTRAMUSCULAR | Status: AC
  Filled 2024-03-12: qty 1

## 2024-03-12 MED ORDER — BUPIVACAINE LIPOSOME 1.3 % IJ SUSP
INTRAMUSCULAR | Status: AC
  Filled 2024-03-12: qty 20

## 2024-03-12 MED ORDER — ONDANSETRON HCL 4 MG/2ML IJ SOLN
INTRAMUSCULAR | Status: DC | PRN
  Administered 2024-03-12: 4 mg via INTRAVENOUS

## 2024-03-12 SURGICAL SUPPLY — 33 items
CANNULA PART THRD DISP 5.75X7 (CANNULA) ×2 IMPLANT
CHLORAPREP W/TINT 26 (MISCELLANEOUS) ×1 IMPLANT
COOLER POLAR GLACIER W/PUMP (MISCELLANEOUS) ×1 IMPLANT
DRAPE INCISE IOBAN 66X45 STRL (DRAPES) ×1 IMPLANT
DRAPE U-SHAPE 47X51 STRL (DRAPES) ×1 IMPLANT
DRSG TEGADERM 4X4.75 (GAUZE/BANDAGES/DRESSINGS) IMPLANT
ELECT REM PT RETURN 9FT ADLT (ELECTROSURGICAL) ×1 IMPLANT
ELECTRODE REM PT RTRN 9FT ADLT (ELECTROSURGICAL) IMPLANT
GAUZE SPONGE 4X4 12PLY STRL (GAUZE/BANDAGES/DRESSINGS) ×1 IMPLANT
GAUZE XEROFORM 1X8 LF (GAUZE/BANDAGES/DRESSINGS) ×1 IMPLANT
GLOVE BIOGEL PI IND STRL 8 (GLOVE) ×1 IMPLANT
GLOVE SURG ORTHO 8.0 STRL STRW (GLOVE) ×2 IMPLANT
GLOVE SURG SYN 8.0 (GLOVE) ×1 IMPLANT
GLOVE SURG SYN 8.0 PF PI (GLOVE) ×1 IMPLANT
GOWN STRL REUS W/ TWL LRG LVL3 (GOWN DISPOSABLE) ×1 IMPLANT
GOWN STRL REUS W/ TWL XL LVL3 (GOWN DISPOSABLE) ×1 IMPLANT
IV LR IRRIG 3000ML ARTHROMATIC (IV SOLUTION) ×20 IMPLANT
KIT STABILIZATION SHOULDER (MISCELLANEOUS) ×1 IMPLANT
KIT TURNOVER KIT A (KITS) ×1 IMPLANT
MANIFOLD NEPTUNE II (INSTRUMENTS) ×2 IMPLANT
MASK FACE SPIDER DISP (MASK) ×1 IMPLANT
MAT ABSORB FLUID 56X50 GRAY (MISCELLANEOUS) ×2 IMPLANT
NDL HYPO 22X1.5 SAFETY MO (MISCELLANEOUS) IMPLANT
NEEDLE HYPO 22X1.5 SAFETY MO (MISCELLANEOUS) IMPLANT
PACK ARTHROSCOPY SHOULDER (MISCELLANEOUS) ×1 IMPLANT
PAD ABD DERMACEA PRESS 5X9 (GAUZE/BANDAGES/DRESSINGS) ×1 IMPLANT
PAD WRAPON POLAR SHDR XLG (MISCELLANEOUS) ×1 IMPLANT
SLING ARM M TX990204 (SOFTGOODS) IMPLANT
SPONGE T-LAP 18X18 ~~LOC~~+RFID (SPONGE) ×1 IMPLANT
TUBE SET DOUBLEFLO INFLOW (TUBING) ×1 IMPLANT
TUBE SET DOUBLEFLO OUTFLOW (TUBING) ×1 IMPLANT
WAND WEREWOLF FLOW 90D (MISCELLANEOUS) IMPLANT
WRAPON POLAR PAD SHDR XLG (MISCELLANEOUS) ×1 IMPLANT

## 2024-03-12 NOTE — Discharge Instructions (Addendum)
 Post-Op Instructions - Shoulder Capsular Release/Manipulation Under Anesthesia  1. Bracing: You should wear a sling for comfort only. Sling should NOT be worn longer than ~1 week.   2. Driving: No driving for 1 week post-op. Must be off narcotic pain medication.  3. Activity: No active lifting for ~2 weeks. Perform range of motion exercises DAILY at home and with physical therapy as prescribed.   4. Physical Therapy: This NEEDS to begin the day after surgery, and proceed ~6-12 weeks. This should be at least 3x/week.   5. Medications:  - You will be provided a prescription for narcotic pain medicine. After surgery, take 1-2 narcotic tablets every 4 hours if needed for severe pain.  - A prescription for anti-nausea medication will be provided in case the narcotic medicine causes nausea - take 1 tablet every 6 hours only if nauseated.   - Take tylenol 1000 mg (2 Extra Strength tablets or 3 regular strength) every 8 hours for pain.  May decrease or stop tylenol 5 days after surgery if you are having minimal pain. - Take ibuprofen 800mg  three times/day with food for at least two weeks every day. This will help reduce post-operative inflammation and swelling. Please call our offices if this causes any stomach/GI irritation.  - Take Aspirin 325mg /daily x 2 weeks to help prevent DVT/PE (Blood clots)   If you are taking prescription medication for anxiety, depression, insomnia, muscle spasm, chronic pain, or for attention deficit disorder, you are advised that you are at a higher risk of adverse effects with use of narcotics post-op, including narcotic addiction/dependence, depressed breathing, death. If you use non-prescribed substances: alcohol, marijuana, cocaine, heroin, methamphetamines, etc., you are at a higher risk of adverse effects with use of narcotics post-op, including narcotic addiction/dependence, depressed breathing, death. You are advised that taking > 50 morphine milligram equivalents  (MME) of narcotic pain medication per day results in twice the risk of overdose or death. For your prescription provided: oxycodone 5 mg - taking more than 6 tablets per day would result in > 50 morphine milligram equivalents (MME) of narcotic pain medication. Be advised that we will prescribe narcotics short-term, for acute post-operative pain only - 3 weeks for major operations such as shoulder repair/reconstruction surgeries.    6. Post-Op Appointment:  Your first post-op appointment will be ~2 weeks post-op.  7. Work or School: For most, but not all procedures, we advise staying out of work or school for at least 1 to 2 weeks in order to recover from the stress of surgery and to allow time for healing.   If you need a work or school note this can be provided.   POLAR CARE INFORMATION  MassAdvertisement.it  How to use [facility] Polar Care [facility] Therapy System?  YouTube   ShippingScam.co.uk  OPERATING INSTRUCTIONS  Start the product With dry hands, connect the transformer to the electrical connection located on the top of the cooler. Next, plug the transformer into an appropriate electrical outlet. The unit will automatically start running at this point.  To stop the pump, disconnect electrical power.  Unplug to stop the product when not in use. Unplugging the Polar Care unit turns it off. Always unplug immediately after use. Never leave it plugged in while unattended. Remove pad.    FIRST ADD WATER TO FILL LINE, THEN ICE---Replace ice when existing ice is almost melted  1 Discuss Treatment with your Licensed Health Care Practitioner and Use Only as Prescribed 2 Apply Insulation Barrier & Cold  Therapy Pad 3 Check for Moisture 4 Inspect Skin Regularly  Tips and Trouble Shooting Usage Tips 1. Use cubed or chunked ice for optimal performance. 2. It is recommended to drain the Pad between uses. To drain the pad, hold the Pad upright with the hose pointed toward  the ground. Depress the black plunger and allow water to drain out. 3. You may disconnect the Pad from the unit without removing the pad from the affected area by depressing the silver tabs on the hose coupling and gently pulling the hoses apart. The Pad and unit will seal itself and will not leak. Note: Some dripping during release is normal. 4. DO NOT RUN PUMP WITHOUT WATER! The pump in this unit is designed to run with water. Running the unit without water will cause permanent damage to the pump. 5. Unplug unit before removing lid.  TROUBLESHOOTING GUIDE Pump not running, Water not flowing to the pad, Pad is not getting cold 1. Make sure the transformer is plugged into the wall outlet. 2. Confirm that the ice and water are filled to the indicated levels. 3. Make sure there are no kinks in the pad. 4. Gently pull on the blue tube to make sure the tube/pad junction is straight. 5. Remove the pad from the treatment site and ll it while the pad is lying at; then reapply. 6. Confirm that the pad couplings are securely attached to the unit. Listen for the double clicks (Figure 1) to confirm the pad couplings are securely attached.  Leaks    Note: Some condensation on the lines, controller, and pads is unavoidable, especially in warmer climates. 1. If using a [employer] Polar Care Cold Therapy unit with a detachable Cold Therapy Pad, and a leak exists (other than condensation on the lines) disconnect the pad couplings. Make sure the silver tabs on the couplings are depressed before reconnecting the pad to the pump hose; then confirm both sides of the coupling are properly clicked in. 2. If the coupling continues to leak or a leak is detected in the pad itself, stop using it and call [employer] Customer Care at [phone].  Cleaning After use, empty and dry the unit with a soft cloth. Warm water and mild detergent may be used occasionally to clean the pump and tubes.  WARNING: The Polar Care Cube can be cold  enough to cause serious injury, including full skin necrosis. Follow these Operating Instructions, and carefully read the Product Insert (see pouch on side of unit) and the Cold Therapy Pad Fitting Instructions (provided with each Cold Therapy Pad) prior to use.  SHOULDER SLING IMMOBILIZER   VIDEO Slingshot 2 Shoulder Brace Application - YouTube ---https://www.porter.info/  INSTRUCTIONS While supporting the injured arm, slide the forearm into the sling. Wrap the adjustable shoulder strap around the neck and shoulders and attach the strap end to the sling using  the "alligator strap tab."  Adjust the shoulder strap to the required length. Position the shoulder pad behind the neck. To secure the shoulder pad location (optional), pull the shoulder strap away from the shoulder pad, unfold the hook material on the top of the pad, then press the shoulder strap back onto the hook material to secure the pad in place. Attach the closure strap across the open top of the sling. Position the strap so that it holds the arm securely in the sling. Next, attach the thumb strap to the open end of the sling between the thumb and fingers. After sling has been  fit, it may be easily removed and reapplied using the quick release buckle on shoulder strap. If a neutral pillow or 15 abduction pillow is included, place the pillow at the waistline. Attach the sling to the pillow, lining up hook material on the pillow with the loop on sling. Adjust the waist strap to fit.  If waist strap is too long, cut it to fit. Use the small piece of double sided hook material (located on top of the pillow) to secure the strap end. Place the double sided hook material on the inside of the cut strap end and secure it to the waist strap.     If no pillow is included, attach the waist strap to the sling and adjust to fit.    Washing Instructions: Straps and sling must be removed and cleaned regularly depending on your  activity level and perspiration. Hand wash straps and sling in cold water with mild detergent, rinse, air dry        Interscalene Nerve Block with Exparel   For your surgery you have received an Interscalene Nerve Block with Exparel. Nerve Blocks affect many types of nerves, including nerves that control movement, pain and normal sensation.  You may experience feelings such as numbness, tingling, heaviness, weakness or the inability to move your arm or the feeling or sensation that your arm has "fallen asleep". A nerve block with Exparel can last up to 5 days.  Usually the weakness wears off first.  The tingling and heaviness usually wear off next.  Finally you may start to notice pain.  Keep in mind that this may occur in any order.  Once a nerve block starts to wear off it is usually completely gone within 60 minutes. ISNB may cause mild shortness of breath, a hoarse voice, blurry vision, unequal pupils, or drooping of the face on the same side as the nerve block.  These symptoms will usually resolve with the numbness.  Very rarely the procedure itself can cause mild seizures. If needed, your surgeon will give you a prescription for pain medication.  It will take about 60 minutes for the oral pain medication to become fully effective.  So, it is recommended that you start taking this medication before the nerve block first begins to wear off, or when you first begin to feel discomfort. Take your pain medication only as prescribed.  Pain medication can cause sedation and decrease your breathing if you take more than you need for the level of pain that you have. Nausea is a common side effect of many pain medications.  You may want to eat something before taking your pain medicine to prevent nausea. After an Interscalene nerve block, you cannot feel pain, pressure or extremes in temperature in the effected arm.  Because your arm is numb it is at an increased risk for injury.  To decrease the  possibility of injury, please practice the following:  While you are awake change the position of your arm frequently to prevent too much pressure on any one area for prolonged periods of time.  If you have a cast or tight dressing, check the color or your fingers every couple of hours.  Call your surgeon with the appearance of any discoloration (white or blue). If you are given a sling to wear before you go home, please wear it  at all times until the block has completely worn off.  Do not get up at night without your sling. Please contact [facility] or  your surgeon if you do not begin to regain sensation after 7 days from the surgery.  Anesthesia may be contacted by calling the Same Day Surgery Department, Mon. through Fri., 6 am to 4 pm at [phone].   If you experience any other problems or concerns, please contact your surgeon's office. If you experience severe or prolonged shortness of breath go to the nearest emergency department.

## 2024-03-12 NOTE — Anesthesia Procedure Notes (Signed)
 Anesthesia Regional Block: Interscalene brachial plexus block   Pre-Anesthetic Checklist: , timeout performed,  Correct Patient, Correct Site, Correct Laterality,  Correct Procedure, Correct Position, site marked,  Risks and benefits discussed,  Surgical consent,  Pre-op evaluation,  At surgeon's request and post-op pain management  Laterality: Left  Prep: chloraprep       Needles:  Injection technique: Single-shot  Needle Type: Echogenic Needle     Needle Length: 4cm  Needle Gauge: 25     Additional Needles:   Procedures:,,,, ultrasound used (permanent image in chart),,    Narrative:  Start time: 03/12/2024 11:21 AM End time: 03/12/2024 11:23 AM Injection made incrementally with aspirations every 5 mL.  Performed by: Personally  Anesthesiologist: Stephanie Coup, MD  Additional Notes: Patient's chart reviewed and they were deemed appropriate candidate for procedure, at surgeon's request. Patient educated about risks, benefits, and alternatives of the block including but not limited to: temporary or permanent nerve damage, bleeding, infection, damage to surround tissues, pneumothorax, hemidiaphragmatic paralysis, unilateral Horner's syndrome, block failure, local anesthetic toxicity. Patient expressed understanding. A formal time-out was conducted consistent with institution rules.  Monitors were applied, and minimal sedation used (see nursing record). The site was prepped with skin prep and allowed to dry, and sterile gloves were used. A high frequency linear ultrasound probe with probe cover was utilized throughout. C5-7 nerve roots located and appeared anatomically normal, local anesthetic injected around them, and echogenic block needle trajectory was monitored throughout. Aspiration performed every 5ml. Lung and blood vessels were avoided. All injections were performed without resistance and free of blood and paresthesias. The patient tolerated the procedure well.  Injectate:  20ml exparel + 10ml 0.5% bupivacaine

## 2024-03-12 NOTE — Anesthesia Preprocedure Evaluation (Signed)
 Anesthesia Evaluation  Patient identified by MRN, date of birth, ID band Patient awake  General Assessment Comment:  Documented difficult airway at last c-spine surgery. Airway note at that time mentions easy mask ventilation, and Grade II view with Mcgrath blade, one attempt. Patient herself was never told she was a difficult airway  Reviewed: Allergy & Precautions, NPO status , Patient's Chart, lab work & pertinent test results  History of Anesthesia Complications (+) DIFFICULT AIRWAY and history of anesthetic complications  Airway Mallampati: II  TM Distance: >3 FB Neck ROM: Limited    Dental no notable dental hx. (+) Teeth Intact   Pulmonary neg pulmonary ROS, neg sleep apnea, neg COPD, Patient abstained from smoking.Not current smoker   Pulmonary exam normal breath sounds clear to auscultation       Cardiovascular Exercise Tolerance: Good hypertension, Pt. on medications (-) CAD and (-) Past MI (-) dysrhythmias  Rhythm:Regular Rate:Normal - Systolic murmurs    Neuro/Psych  Headaches  Neuromuscular disease  negative psych ROS   GI/Hepatic ,GERD  Medicated and Controlled,,(+)     (-) substance abuse    Endo/Other  neg diabetes  Class 3 obesity  Renal/GU      Musculoskeletal   Abdominal   Peds  Hematology   Anesthesia Other Findings Past Medical History: No date: Anemia No date: Carpal tunnel syndrome of right wrist No date: Celiac disease/sprue No date: Cirrhosis (HCC)     Comment:  seeing Dr Cathie Hoops No date: DJD (degenerative joint disease) No date: Erythrocytosis No date: Essential hypertension No date: Fatty liver 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:  left No date: GERD (gastroesophageal reflux disease) No date: Gluten intolerance 07/18/2019: Iron overload No date: 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 No date: Lumbago No date: Lymphedema of lower extremity No date: Migraines No date: Mitral valve  insufficiency No date: Morbid obesity (HCC) No date: Neuropathy of both feet No date: Nonalcoholic fatty liver disease No date: Osteoarthritis No date: Portal venous hypertension (HCC) No date: Radiculopathy of cervical region No date: Vertigo  Reproductive/Obstetrics                             Anesthesia Physical Anesthesia Plan  ASA: 3  Anesthesia Plan: General ETT   Post-op Pain Management: Regional block*   Induction: Intravenous  PONV Risk Score and Plan: 3 and Ondansetron, Dexamethasone and Midazolam  Airway Management Planned: Oral ETT  Additional Equipment:   Intra-op Plan:   Post-operative Plan: Extubation in OR  Informed Consent: I have reviewed the patients History and Physical, chart, labs and discussed the procedure including the risks, benefits and alternatives for the proposed anesthesia with the patient or authorized representative who has indicated his/her understanding and acceptance.     Dental Advisory Given  Plan Discussed with: Anesthesiologist, CRNA and Surgeon  Anesthesia Plan Comments: (Patient consented for risks of anesthesia including but not limited to:  - adverse reactions to medications - damage to eyes, teeth, lips or other oral mucosa - nerve damage due to positioning  - sore throat or hoarseness - Damage to heart, brain, nerves, lungs, other parts of body or loss of life  Patient voiced understanding and assent.)       Anesthesia Quick Evaluation

## 2024-03-12 NOTE — Anesthesia Procedure Notes (Signed)
 Procedure Name: Intubation Date/Time: 03/12/2024 12:25 PM  Performed by: Darrell Jewel I, CRNAPre-anesthesia Checklist: Patient identified, Patient being monitored, Timeout performed, Emergency Drugs available and Suction available Patient Re-evaluated:Patient Re-evaluated prior to induction Oxygen Delivery Method: Circle system utilized Preoxygenation: Pre-oxygenation with 100% oxygen Induction Type: IV induction Ventilation: Mask ventilation without difficulty Laryngoscope Size: 3 and McGrath Grade View: Grade I Tube type: Oral Tube size: 7.0 mm Number of attempts: 1 Airway Equipment and Method: Stylet Placement Confirmation: ETT inserted through vocal cords under direct vision, positive ETCO2 and breath sounds checked- equal and bilateral Secured at: 22 cm Tube secured with: Tape Dental Injury: Teeth and Oropharynx as per pre-operative assessment  Future Recommendations: Recommend- induction with short-acting agent, and alternative techniques readily available

## 2024-03-12 NOTE — H&P (Signed)
 Paper H&P to be scanned into permanent record. H&P reviewed. No significant changes noted.

## 2024-03-12 NOTE — Transfer of Care (Signed)
 Immediate Anesthesia Transfer of Care Note  Patient: Tamara Dodson  Procedure(s) Performed: ARTHROSCOPY, SHOULDER WITH DEBRIDEMENT (Left: Shoulder) EXAM UNDER ANESTHESIA, SHOULDER, WITH MANIPULATION (Left: Shoulder)  Patient Location: PACU  Anesthesia Type:General  Level of Consciousness: awake and alert   Airway & Oxygen Therapy: Patient Spontanous Breathing and Patient connected to nasal cannula oxygen  Post-op Assessment: Report given to RN and Post -op Vital signs reviewed and stable  Post vital signs: stable  Last Vitals:  Vitals Value Taken Time  BP 110/44 03/12/24 1405  Temp    Pulse 61 03/12/24 1409  Resp 31 03/12/24 1409  SpO2 100 % 03/12/24 1409  Vitals shown include unfiled device data.  Last Pain:  Vitals:   03/12/24 1117  TempSrc:   PainSc: 9          Complications: No notable events documented.

## 2024-03-12 NOTE — Op Note (Signed)
 OPERATIVE NOTE SURGERY DATE: 03/12/2024  PRE-OP DIAGNOSIS: 1. Left shoulder adhesive capsulitis   POST-OP DIAGNOSIS:  1. Left shoulder adhesive capsulitis 2.  Left shoulder multiple loose bodies  PROCEDURES: 1. Left shoulder capsular releases with synovectomy, lysis of adhesions, manipulation under anesthesia  2.  Left shoulder arthroscopic loose body removal  3. Left shoulder extensive glenohumeral debridement 4. Left glenohumeral and subacromial injections with corticosteroid  SURGEON:  Rosealee Albee, MD  ASSISTANT(S): None  ANESTHESIA: Regional block with Exparel, Gen  TOTAL IV FLUIDS: per anesthesia record   ESTIMATED BLOOD LOSS: Minimal  DRAINS:  None.  SPECIMENS: None  IMPLANTS: None.  COMPLICATIONS: none  INDICATIONS: Tamara Dodson is a 63 y.o. female with complaints of left shoulder pain and stiffness.  She has a history of left shoulder Regeneten patch application with biceps tenodesis on 04/11/2022 by me.  Postoperatively, she felt a pop in her shoulder during a physical therapy session and had dramatically increased pain and reduced range of motion.  She then underwent left arthroscopic rotator cuff repair on 08/11/2022.  She did well from that surgery postoperatively until approximately 4 months ago when she began develop increased shoulder pain that was consistent with adhesive capsulitis.  She had failed extensive nonoperative management. Given this, after discussion of risks, benefits, and alternatives to surgery, the patient elected to proceed with surgical intervention consisting of capsular releases, manipulation under anesthesia, and subacromial decompression/bursectomy with corticosteroid injections into glenohumeral joint and subacromial space.   OPERATIVE FINDINGS:  Operative Shoulder Range of Motion:  Preop  Postop  Flexion  110 150  Abduction  90 120  ER at 0  30 60  ER at 90  75 95  IR at 90  20 45  IR posterior  T10 T6    Intra-operative  findings: A thorough arthroscopic examination of the shoulder was performed.  The findings are: 1. Biceps tendon: Not visualized 2. Superior labrum: injected with surrounding synovitis  3. Posterior labrum and capsule: Significant synovitis and thickening of capsule 4. Inferior capsule and inferior recess: Significant synovitis and thickening of capsule 5. Glenoid cartilage surface: Mild degenerative changes about inferior glenoid 6. Supraspinatus attachment: Healed rotator cuff repair with ~10% articular sided tearing posteriorly 7. Posterior rotator cuff attachment:  Normal 8. Humeral head articular cartilage: Focal areas of grade 1-2 degenerative changes 9. Rotator interval: significant synovitis and thickening of capsule 10: Subscapularis tendon:  Normal, significant adhesions about the tendon 11. Anterior labrum: Normal 12. IGHL: significant synovitis around IGHL  Additionally, there were multiple (~50) loose bodies within the joint that appear to be consistent with cartilaginous/bony fragments without obvious donor site.  DETAILS OF PROCEDURE: The patient was identified in the preoperative holding area. Informed consent was obtained. Operative extremity was marked. After satisfactory upper extremity regional block with Exparel was performed in the preoperative holding area, the patient was brought to the operating room and placed in a well-padded beach chair positioner.  Eyes were protected, head was affixed in neutral, and the patient was given preoperative IV antibiotics within 30 minutes of the start of the case and a surgical time-out occurred. The upper extremity was prescrubbed with Hibiclens and alcohol, prepped with ChloraPrep and draped in the usual sterile fashion.    I then created a standard posterior portal with an 11 blade. The glenohumeral joint was entered with a blunt trochar and the arthroscope introduced. The findings of diagnostic arthroscopy are described above.  A  standard anterior portal was made.  The joint was first remarkable for the multiple, approximately 50, loose bodies that were within the joint.  An oscillating shaver was used to remove these loose bodies from the joint.  Additionally, the shoulder was remarkable for moderate synovitis which was chronic in the anterior, superior, posterior, and inferior aspects. This required synovectomy with a shaver and Arthrocare device in the affected compartments listed above.  A combination of electrocautery and oscillating shaver was used to debride the rotator interval tissue.  The posterior aspect of the coracoid was exposed.  The adhesions off of the anterior and posterior aspects of the subscapularis were cleared so there was no tethering. Next, an upbiting duckbill basket was then used to perform a capsulotomy of the rotator interval and then the MGHL and the IGHL (anterior band).  Care was taken to protect the intraarticular subscapularis.  Adhesions were cleared off the subscapularis to allow full internal and external rotation.    The arthroscope was placed into the anterior portal.  The posterior capsule was quite thickened and inflamed as well.  After synovectomy, the duckbill basket was used to perform release from the superior glenoid, down into the axillary pouch, around to the anterior band of the IGHL.  A complete 360 capsulotomy was performed in this manner.  Care was taken to protect the axillary nerve by staying on the glenoid side and making sure not to rotate the shoulder externally during the capsulotomy.  Hemostasis was achieved with the ArthroCare wand.  There was no unusual bleeding.    The arthroscope was placed in the subacromial space. An accessory lateral portal was established. There was chronic bursitis and adhesions filling the whole subacromial space and gutters.  A complete subacromial bursectomy and debridement of the gutters and subacromial space was carried out with a shaver.   ArthroCare was used to control bleeding.  Rotator cuff was noted to be intact.  The instruments were removed from the shoulder.     Manipulation under anesthesia was carried out in a gentle, controlled manner with short lever arms.  There was palpable release of adhesions. See above chart for post-manipulation improvement in range of motion.   The skin was closed with interrupted 3-0 nylon sutures. Injections of 40 mg Kenalog with and 0.5% ropivacaine were placed separately in the glenohumeral joint and subacromial space with a spinal needle.  Xeroform gauze, sterile dressings were applied. The patient was placed in a shoulder sling.  Polar Care was applied.    Instrument, sponge, and needle counts were correct prior to closure and at the conclusion of the case.   DISPOSITION: PACU - hemodynamically stable.  POSTOPERATIVE PLAN: The patient will be discharged home. PT to begin 1 day postop for range of motion exercises.  ASA x 2 weeks for DVT ppx. Sling only for comfort and wean this week as soon as tolerated. RTC in ~2 weeks.

## 2024-03-13 ENCOUNTER — Encounter: Payer: Self-pay | Admitting: Orthopedic Surgery

## 2024-03-13 NOTE — Anesthesia Postprocedure Evaluation (Signed)
 Anesthesia Post Note  Patient: Tamara Dodson  Procedure(s) Performed: ARTHROSCOPY, SHOULDER WITH DEBRIDEMENT (Left: Shoulder) EXAM UNDER ANESTHESIA, SHOULDER, WITH MANIPULATION (Left: Shoulder)  Patient location during evaluation: PACU Anesthesia Type: General Level of consciousness: awake and alert Pain management: pain level controlled Vital Signs Assessment: post-procedure vital signs reviewed and stable Respiratory status: spontaneous breathing, nonlabored ventilation, respiratory function stable and patient connected to nasal cannula oxygen Cardiovascular status: blood pressure returned to baseline and stable Postop Assessment: no apparent nausea or vomiting Anesthetic complications: no   No notable events documented.   Last Vitals:  Vitals:   03/12/24 1431 03/12/24 1501  BP: (!) 123/57 122/64  Pulse: 72 62  Resp: 18 17  Temp:  (!) 36.2 C  SpO2: 99% 100%    Last Pain:  Vitals:   03/12/24 1501  TempSrc: Temporal  PainSc: 0-No pain                 Stephanie Coup

## 2024-03-19 ENCOUNTER — Encounter: Payer: Self-pay | Admitting: Oncology

## 2024-03-20 ENCOUNTER — Ambulatory Visit (INDEPENDENT_AMBULATORY_CARE_PROVIDER_SITE_OTHER): Payer: BC Managed Care – PPO | Admitting: Physician Assistant

## 2024-03-20 VITALS — BP 122/78 | Temp 98.0°F | Wt 297.0 lb

## 2024-03-20 DIAGNOSIS — G5603 Carpal tunnel syndrome, bilateral upper limbs: Secondary | ICD-10-CM

## 2024-03-20 DIAGNOSIS — T8149XA Infection following a procedure, other surgical site, initial encounter: Secondary | ICD-10-CM

## 2024-03-20 DIAGNOSIS — Z09 Encounter for follow-up examination after completed treatment for conditions other than malignant neoplasm: Secondary | ICD-10-CM

## 2024-03-20 MED ORDER — SULFAMETHOXAZOLE-TRIMETHOPRIM 400-80 MG PO TABS: 1.0000 | ORAL_TABLET | Freq: Two times a day (BID) | ORAL | 0 refills | Status: AC

## 2024-03-20 NOTE — Progress Notes (Unsigned)
   REFERRING PHYSICIAN:  [name], Md [address] [place],  [place] [address]  DOS: ***  03/05/24, L CTR  HISTORY OF PRESENT ILLNESS: Tamara Dodson is *** status post ***. Overall, she is doing ***  Said pus camer out of it 2 days ago   No more numbness and tingling in hands   PHYSICAL EXAMINATION:  NEUROLOGICAL:  General: In no acute distress.   Awake, alert, oriented to person, place, and time.  Pupils equal round and reactive to light.  Facial tone is symmetric.  Tongue protrusion is midline.  There is no pronator drift.  Strength: Side Biceps Triceps Deltoid Interossei Grip Wrist Ext. Wrist Flex.  R 5 5 5 5 5 5 5   L 5 5 5 5 5 5 5    Incision c/d/I- some purulent drainage noted  Imaging:  ***  Assessment / Plan: Tamara Dodson is doing well after ***. We discussed activity escalation and I have advised the patient to lift up to 10 pounds until 6 weeks after surgery, then increase up to 25 pounds until 12 weeks after surgery.  After 12 weeks post-op, the patient advised to increase activity as tolerated. she will return to clinic in approximately ***    Advised to contact the office if any questions or concerns arise.   Joan Flores PA-C Dept of Neurosurgery

## 2024-03-27 ENCOUNTER — Ambulatory Visit (INDEPENDENT_AMBULATORY_CARE_PROVIDER_SITE_OTHER): Admitting: Physician Assistant

## 2024-03-27 DIAGNOSIS — G5603 Carpal tunnel syndrome, bilateral upper limbs: Secondary | ICD-10-CM

## 2024-03-27 DIAGNOSIS — Z09 Encounter for follow-up examination after completed treatment for conditions other than malignant neoplasm: Secondary | ICD-10-CM

## 2024-03-27 NOTE — Progress Notes (Signed)
 Patient was called to check up on her.  She was last seen in clinic 1 week ago and found to have a superficial wound infection status post left sided ultrasound-guided carpal tunnel release.  She was given antibiotics and dressings for her wound.  On the call today she states that it is appearing much better and she no longer has any drainage from her incision site.  Her last dose of antibiotics will be tonight.  Picture of her incision was also sent via MyChart which was well-appearing and without any erythema.  Patient was instructed to call us for any questions or concerns that she has in the future or any changes to her wound site.       I connected with  [name] on 03/27/24 via telephone and patient's private residence and a mine as well. I verified that I am speaking with the correct person using two identifiers.   I discussed the limitations of evaluation and management by telemedicine. The patient expressed understanding and agreed to proceed.  We spoke for 2 minutes on the phone.

## 2024-04-17 ENCOUNTER — Encounter: Payer: BC Managed Care – PPO | Admitting: Neurosurgery

## 2024-04-22 ENCOUNTER — Ambulatory Visit (INDEPENDENT_AMBULATORY_CARE_PROVIDER_SITE_OTHER): Admitting: Neurosurgery

## 2024-04-22 ENCOUNTER — Encounter: Payer: Self-pay | Admitting: Neurosurgery

## 2024-04-22 VITALS — BP 122/80 | Ht 71.0 in | Wt 297.0 lb

## 2024-04-22 DIAGNOSIS — G5603 Carpal tunnel syndrome, bilateral upper limbs: Secondary | ICD-10-CM

## 2024-04-22 DIAGNOSIS — Z09 Encounter for follow-up examination after completed treatment for conditions other than malignant neoplasm: Secondary | ICD-10-CM

## 2024-04-23 NOTE — Progress Notes (Signed)
   REFERRING PHYSICIAN:  Lyle San, Md [address] [place],  [place] [address]  DOS:  01/23/2024 right carpal tunnel release with ultrasound guidance 03/05/24,  left carpal tunnel release with ultrasound guidance  HISTORY OF PRESENT ILLNESS: Tamara Dodson is 72-month status post right carpal tunnel release and 6 weeks status post left carpal tunnel release with ultrasound guidance.  States that she has had significant improvement after her antibiotic course.  Feels that her left hand is much better than it was preoperatively, but still gets some intermittent symptoms especially when sleeping with a bent hand.   PHYSICAL EXAMINATION:  NEUROLOGICAL:  General: In no acute distress.   Awake, alert, oriented to person, place, and time.  Pupils equal round and reactive to light.  Facial tone is symmetric.    Strength:  Exam is to baseline to bilateral hands.  Good strength in bilateral median ulnar intrinsics as well as grip.  Bilateral incisions are well-healed  Imaging:  No new imaging.  Assessment / Plan: Tamara Dodson is 31-month status post right carpal tunnel release and 6 weeks status post left carpal tunnel release with ultrasound guidance.  Right side is healed well without residual symptoms.  Left side had some swelling and erythema she was given an antibiotic course and improved significantly.  She is now well-healed.  She gets some intermittent symptoms continued on the left however these are much better than they were preoperatively.  At this time we will continue to follow.   Advised to contact the office if any questions or concerns arise.   Carroll Clamp, MD Dept of Neurosurgery

## 2024-05-07 ENCOUNTER — Encounter (INDEPENDENT_AMBULATORY_CARE_PROVIDER_SITE_OTHER): Payer: Self-pay

## 2024-05-20 ENCOUNTER — Other Ambulatory Visit: Payer: Self-pay

## 2024-05-20 DIAGNOSIS — M5412 Radiculopathy, cervical region: Secondary | ICD-10-CM

## 2024-05-23 ENCOUNTER — Encounter: Payer: Self-pay | Admitting: Neurosurgery

## 2024-05-23 ENCOUNTER — Ambulatory Visit
Admission: RE | Admit: 2024-05-23 | Discharge: 2024-05-23 | Disposition: A | Discharge status: Home / Self Care | Source: Ambulatory Visit | Attending: Neurosurgery | Admitting: Neurosurgery

## 2024-05-23 ENCOUNTER — Ambulatory Visit
Admission: RE | Admit: 2024-05-23 | Discharge: 2024-05-23 | Disposition: A | Discharge status: Home / Self Care | Attending: Neurosurgery | Admitting: Neurosurgery

## 2024-05-23 ENCOUNTER — Ambulatory Visit: Payer: Medicare Other | Admitting: Neurosurgery

## 2024-05-23 VITALS — BP 124/72 | Ht 71.0 in | Wt 297.0 lb

## 2024-05-23 DIAGNOSIS — M5412 Radiculopathy, cervical region: Secondary | ICD-10-CM

## 2024-05-23 DIAGNOSIS — G5603 Carpal tunnel syndrome, bilateral upper limbs: Secondary | ICD-10-CM

## 2024-05-23 DIAGNOSIS — Z09 Encounter for follow-up examination after completed treatment for conditions other than malignant neoplasm: Secondary | ICD-10-CM

## 2024-05-23 NOTE — Progress Notes (Signed)
   REFERRING PHYSICIAN:  Lyle San, Md [address] [place],  [place] [address]  DOS: 09/04/23  ACDF C6-C7  HISTORY OF PRESENT ILLNESS: Tamara Dodson is status post above surgery. Given oxycodone  and robaxin  on discharge from the hospital.   She is doing well.  She has occasional neck discomfort.  She is recovering from 2 carpal tunnel releases.  She is doing well from that.     PHYSICAL EXAMINATION:  NEUROLOGICAL:  General: In no acute distress.   Awake, alert, oriented to person, place, and time.  Pupils equal round and reactive to light.  Facial tone is symmetric.    Strength: Side Biceps Triceps Deltoid Interossei Grip Wrist Ext. Wrist Flex.  R 5 5 5 5 5 5 5   L 5 5 5 5 5 5 5    Incision c/d/i  Imaging:  No complications noted   Assessment / Plan: Tamara Dodson is doing well s/p above surgery.    I will see her back on an as-needed basis.  I am pleased with her improvements.    I spent a total of 10 minutes in this patient's care today. This time was spent reviewing pertinent records including imaging studies, obtaining and confirming history, performing a directed evaluation, formulating and discussing my recommendations, and documenting the visit within the medical record.     Jodeen Munch MD Dept of Neurosurgery

## 2024-08-22 DIAGNOSIS — M7502 Adhesive capsulitis of left shoulder: Secondary | ICD-10-CM

## 2024-08-23 ENCOUNTER — Other Ambulatory Visit: Payer: Self-pay | Admitting: Family Medicine

## 2024-08-23 ENCOUNTER — Encounter: Payer: Self-pay | Admitting: Oncology

## 2024-08-23 DIAGNOSIS — Z1231 Encounter for screening mammogram for malignant neoplasm of breast: Secondary | ICD-10-CM

## 2024-08-26 ENCOUNTER — Ambulatory Visit
Admission: RE | Admit: 2024-08-26 | Discharge: 2024-08-26 | Disposition: A | Discharge status: Home / Self Care | Source: Ambulatory Visit | Attending: Orthopedic Surgery | Admitting: Orthopedic Surgery

## 2024-08-26 DIAGNOSIS — M7502 Adhesive capsulitis of left shoulder: Secondary | ICD-10-CM | POA: Insufficient documentation

## 2024-08-27 ENCOUNTER — Ambulatory Visit (INDEPENDENT_AMBULATORY_CARE_PROVIDER_SITE_OTHER): Admitting: Neurosurgery

## 2024-08-27 ENCOUNTER — Encounter: Payer: Self-pay | Admitting: Neurosurgery

## 2024-08-27 VITALS — BP 114/66 | Ht 71.0 in | Wt 297.0 lb

## 2024-08-27 DIAGNOSIS — M25512 Pain in left shoulder: Secondary | ICD-10-CM

## 2024-08-27 DIAGNOSIS — M5412 Radiculopathy, cervical region: Secondary | ICD-10-CM

## 2024-08-27 NOTE — Progress Notes (Signed)
   REFERRING PHYSICIAN:  Valora Agent, Md [address] [place],  [place] [address]  DOS: 09/04/23  ACDF C6-C7  HISTORY OF PRESENT ILLNESS: Tamara Dodson is status post above surgery.  She has been doing with a substantial amount of her left shoulder pain.  She has seen her orthopedist who asked that she be reevaluated.  She does have some limitation of rotation, but that has improved over the last week.  She continues to have episodes of pain into her shoulder blade and pain down her left arm.  She has some numbness and tingling in her hands.   PHYSICAL EXAMINATION:  NEUROLOGICAL:  General: In no acute distress.   Awake, alert, oriented to person, place, and time.  Pupils equal round and reactive to light.  Facial tone is symmetric.    Strength: Side Biceps Triceps Deltoid Interossei Grip Wrist Ext. Wrist Flex.  R 5 5 5 5 5 5 5   L 5 5 2  (pain) 5 5 5 5    Incision c/d/i  Imaging:  No complications noted   Assessment / Plan: Tamara Dodson is doing fair.  She has pain around her left shoulder that seems primarily orthopedic in origin.  It is possible she is still suffering from some cervical radiculopathy.  Given the uncertainty, I have recommended a cervical spine MRI scan.     I spent a total of 10 minutes in this patient's care today. This time was spent reviewing pertinent records including imaging studies, obtaining and confirming history, performing a directed evaluation, formulating and discussing my recommendations, and documenting the visit within the medical record.     Tamara Daisy MD Dept of Neurosurgery

## 2024-08-31 ENCOUNTER — Ambulatory Visit
Admission: RE | Admit: 2024-08-31 | Discharge: 2024-08-31 | Disposition: A | Discharge status: Home / Self Care | Source: Ambulatory Visit | Attending: Neurosurgery | Admitting: Neurosurgery

## 2024-08-31 DIAGNOSIS — M25512 Pain in left shoulder: Secondary | ICD-10-CM | POA: Insufficient documentation

## 2024-08-31 DIAGNOSIS — M5412 Radiculopathy, cervical region: Secondary | ICD-10-CM | POA: Diagnosis present

## 2024-09-05 ENCOUNTER — Encounter: Payer: Self-pay | Admitting: Oncology

## 2024-09-05 ENCOUNTER — Ambulatory Visit
Admission: RE | Admit: 2024-09-05 | Discharge: 2024-09-05 | Disposition: A | Discharge status: Home / Self Care | Source: Ambulatory Visit | Attending: Family Medicine | Admitting: Family Medicine

## 2024-09-05 DIAGNOSIS — Z1231 Encounter for screening mammogram for malignant neoplasm of breast: Secondary | ICD-10-CM | POA: Diagnosis present

## 2024-09-09 ENCOUNTER — Encounter: Payer: Self-pay | Admitting: Family Medicine

## 2024-09-12 ENCOUNTER — Ambulatory Visit
Admission: RE | Admit: 2024-09-12 | Discharge: 2024-09-12 | Disposition: A | Discharge status: Home / Self Care | Source: Ambulatory Visit | Attending: Family Medicine | Admitting: Family Medicine

## 2024-09-12 ENCOUNTER — Other Ambulatory Visit: Payer: Self-pay | Admitting: Family Medicine

## 2024-09-12 DIAGNOSIS — N6489 Other specified disorders of breast: Secondary | ICD-10-CM

## 2024-09-12 DIAGNOSIS — R928 Other abnormal and inconclusive findings on diagnostic imaging of breast: Secondary | ICD-10-CM | POA: Diagnosis present

## 2024-09-17 ENCOUNTER — Encounter: Admitting: Physician Assistant

## 2024-09-17 ENCOUNTER — Ambulatory Visit (INDEPENDENT_AMBULATORY_CARE_PROVIDER_SITE_OTHER): Admitting: Neurosurgery

## 2024-09-17 ENCOUNTER — Encounter: Payer: Self-pay | Admitting: Neurosurgery

## 2024-09-17 VITALS — BP 116/78 | Ht 71.0 in | Wt 307.8 lb

## 2024-09-17 DIAGNOSIS — M545 Low back pain, unspecified: Secondary | ICD-10-CM | POA: Diagnosis not present

## 2024-09-17 DIAGNOSIS — M5412 Radiculopathy, cervical region: Secondary | ICD-10-CM

## 2024-09-17 DIAGNOSIS — M25512 Pain in left shoulder: Secondary | ICD-10-CM

## 2024-09-17 DIAGNOSIS — M4802 Spinal stenosis, cervical region: Secondary | ICD-10-CM

## 2024-09-17 DIAGNOSIS — G8929 Other chronic pain: Secondary | ICD-10-CM

## 2024-09-17 NOTE — Progress Notes (Signed)
   REFERRING PHYSICIAN:  Stanton Lynwood FALCON, Md [address] [place],  [place] [address]  DOS: 09/04/23  ACDF C6-C7  HISTORY OF PRESENT ILLNESS: Tamara Dodson is status post above surgery.    She has been doing with a substantial amount of her left shoulder pain.  Her pain is in her upper arm and is very severe on the front, side, and back of her left shoulder particularly with movement.  She has trouble laying down on her left arm.  She is here today to review her imaging.  She is additionally having low back pain.  She has no radicular pain.  This has been bothering her for several weeks.    PHYSICAL EXAMINATION:  NEUROLOGICAL:  General: In no acute distress.   Awake, alert, oriented to person, place, and time.  Pupils equal round and reactive to light.  Facial tone is symmetric.    Strength: Side Biceps Triceps Deltoid Interossei Grip Wrist Ext. Wrist Flex.  R 5 5 5 5 5 5 5   L 5 5 3  (pain) 5 5 5 5    Incision c/d/i  Imaging:  MRI C spine 08/31/2024 Disc levels:   C2-C3: Normal interspace. Minimal right-sided facet hypertrophy. No canal or foraminal stenosis.   C3-C4: Prior fusion. No residual spinal stenosis. Uncovertebral spurring with residual mild left C4 foraminal stenosis. Right neural foramina remains patent.   C4-C5: Prior fusion. No residual spinal stenosis. Uncovertebral spurring with residual moderate left C5 foraminal narrowing. Right neural foramen remains patent.   C5-C6: Prior fusion. Right paracentral endplate spur indents the ventral thecal sac with residual mild flattening of the right hemi cord, but no cord signal changes. No significant residual spinal stenosis. Uncovertebral spurring with residual moderate bilateral C6 foraminal narrowing.   C6-C7: Prior fusion. No residual spinal stenosis. Uncovertebral spurring with residual moderate left C7 foraminal narrowing. Right neural foramina remains patent.   C7-T1: Small central  disc protrusion minimally indents the ventral thecal sac. Mild right-sided facet arthrosis. No spinal stenosis. Foramina remain patent. Appearance is relatively stable.   Moderate multilevel facet arthrosis noted within the visualized upper thoracic spine.   IMPRESSION: 1. Prior ACDF at C3-C7, new at C6-7 from prior. No residual spinal stenosis. 2. Uncovertebral spurring with residual moderate left C5, bilateral C6, and left C7 foraminal stenosis. 3. Small central disc protrusion at C7-T1 without stenosis or impingement, stable.     Electronically Signed   By: Morene Hoard M.D.   On: 08/31/2024 18:51  Assessment / Plan: Tamara Dodson is doing fair.     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  I think her left shoulder is her most significant issue currently.  I will discuss with her orthopedist.  For her low back, will start physical therapy.  I will do a follow-up telephone call with her in approximately 6 to 8 weeks and order imaging at that time if she continues to have pain.    I spent a total of 10 minutes in this patient's care today. This time was spent reviewing pertinent records including imaging studies, obtaining and confirming history, performing a directed evaluation, formulating and discussing my recommendations, and documenting the visit within the medical record.     Reeves Daisy MD Dept of Neurosurgery

## 2024-09-27 ENCOUNTER — Inpatient Hospital Stay: Payer: Medicare Other | Attending: Oncology

## 2024-09-27 DIAGNOSIS — Z148 Genetic carrier of other disease: Secondary | ICD-10-CM | POA: Insufficient documentation

## 2024-09-27 DIAGNOSIS — R7989 Other specified abnormal findings of blood chemistry: Secondary | ICD-10-CM | POA: Diagnosis not present

## 2024-09-27 DIAGNOSIS — Z862 Personal history of diseases of the blood and blood-forming organs and certain disorders involving the immune mechanism: Secondary | ICD-10-CM | POA: Diagnosis not present

## 2024-09-27 DIAGNOSIS — D751 Secondary polycythemia: Secondary | ICD-10-CM | POA: Diagnosis present

## 2024-09-27 LAB — CBC WITH DIFFERENTIAL (CANCER CENTER ONLY)
Abs Immature Granulocytes: 0.22 K/uL — ABNORMAL HIGH (ref 0.00–0.07)
Basophils Absolute: 0.2 K/uL — ABNORMAL HIGH (ref 0.0–0.1)
Basophils Relative: 1 %
Eosinophils Absolute: 0.2 K/uL (ref 0.0–0.5)
Eosinophils Relative: 1 %
HCT: 44.5 % (ref 36.0–46.0)
Hemoglobin: 15 g/dL (ref 12.0–15.0)
Immature Granulocytes: 2 %
Lymphocytes Relative: 13 %
Lymphs Abs: 1.6 K/uL (ref 0.7–4.0)
MCH: 31.6 pg (ref 26.0–34.0)
MCHC: 33.7 g/dL (ref 30.0–36.0)
MCV: 93.7 fL (ref 80.0–100.0)
Monocytes Absolute: 0.9 K/uL (ref 0.1–1.0)
Monocytes Relative: 8 %
Neutro Abs: 8.9 K/uL — ABNORMAL HIGH (ref 1.7–7.7)
Neutrophils Relative %: 75 %
Platelet Count: 168 K/uL (ref 150–400)
RBC: 4.75 MIL/uL (ref 3.87–5.11)
RDW: 14 % (ref 11.5–15.5)
WBC Count: 11.9 K/uL — ABNORMAL HIGH (ref 4.0–10.5)
nRBC: 0 % (ref 0.0–0.2)

## 2024-09-27 LAB — HEPATIC FUNCTION PANEL
ALT: 20 U/L (ref 0–44)
AST: 32 U/L (ref 15–41)
Albumin: 3.9 g/dL (ref 3.5–5.0)
Alkaline Phosphatase: 87 U/L (ref 38–126)
Bilirubin, Direct: 0.3 mg/dL — ABNORMAL HIGH (ref 0.0–0.2)
Indirect Bilirubin: 1 mg/dL — ABNORMAL HIGH (ref 0.3–0.9)
Total Bilirubin: 1.3 mg/dL — ABNORMAL HIGH (ref 0.0–1.2)
Total Protein: 7.6 g/dL (ref 6.5–8.1)

## 2024-09-27 LAB — FERRITIN: Ferritin: 205 ng/mL (ref 11–307)

## 2024-09-27 LAB — IRON AND TIBC
Iron: 111 ug/dL (ref 28–170)
Saturation Ratios: 34 % — ABNORMAL HIGH (ref 10.4–31.8)
TIBC: 323 ug/dL (ref 250–450)
UIBC: 212 ug/dL

## 2024-10-01 NOTE — Progress Notes (Signed)
 ORTHOPEDIC SURGERY - SHOULDER EVALUATION  Chief Complaint: Chief Complaint  Patient presents with  . Left Shoulder - Pain   04/11/22: PROCEDURES:  1. Left arthroscopic rotator cuff repair using Regeneten patch 2. Left arthroscopic biceps tenodesis 3. Left extensive debridement of shoulder (glenohumeral and subacromial spaces) 4. Left arthroscopic subacromial decompression  08/11/22: PROCEDURES:  1. Left arthroscopic rotator cuff repair 2. Left subacromial decompression 3. Left extensive debridement of shoulder (glenohumeral and subacromial spaces) 4. Left arthroscopic distasl clavicle excision   03/12/24: PROCEDURES: 1. Left shoulder capsular releases with synovectomy, lysis of adhesions, manipulation under anesthesia  2. Left shoulder arthroscopic loose body removal  3. Left shoulder extensive glenohumeral debridement 4. Left glenohumeral and subacromial injections with corticosteroid   History of Present Illness: 10/02/24: Tamara Dodson has now almost 7 months after undergoing above procedures.  Tamara Dodson continues to have severe pain about the left shoulder with difficulty moving his shoulder in any direction.  Tamara Dodson is holding her arm in a brace to position to avoid movement.  Tamara Dodson has difficulty sleeping and performing daily activities.  Her shoulder feels like it did prior to her last surgery.  This is despite feeling quite well after surgery initially.  08/21/24: Tamara Dodson states that Tamara Dodson does not think the combined injection helped her pain much.  Tamara Dodson is also on having increased right shoulder pain for the past 2-3 weeks.  Tamara Dodson is having difficulty with bilateral shoulders especially reaching overhead.  07/24/24: Now 4.5 mo since last L shoulder left shoulder release and manipulation under anesthesia.  At her last visit with me, Tamara Dodson is quite pleased with the result of her surgery and was doing quite well with no significant pain or other symptoms.  Tamara Dodson states that over the past 2 months, Tamara Dodson has had some  progressive worsening in shoulder pain without traumatic event or aggravating activity.  Tamara Dodson feels that her shoulder is very stiff and Tamara Dodson is unable to move it.  Tamara Dodson is having severe pain with attempted motion.  05/08/2024: Tamara Dodson presents now approximately 8 weeks after above surgeries.  Tamara Dodson is extremely pleased with the result of her surgery.  Tamara Dodson states Tamara Dodson has no pain.  Tamara Dodson has returned to performing all desired activities.  Tamara Dodson has finished physical therapy as well.  02/28/2024: Tamara Dodson underwent a second round of combined glenohumeral joint/subacromial space corticosteroid injection at her last visit with me 1 month ago.  Tamara Dodson states that this did not help her symptoms, and in fact, her pain has worsened.  Tamara Dodson is having significant pain with almost any range of motion.  Tamara Dodson is having pain at night that affects her ability to sleep.  Tamara Dodson does state that after her initial recovery from prior left arthroscopic rotator cuff repair in August 2023, Tamara Dodson did note significant improvement and was relatively satisfied with her symptoms after her cervical spine issues were addressed.   01/31/24: At the patient's last visit with me, a combined glenohumeral joint subacromial space corticosteroid injection was given for adhesive capsulitis.  Since that time, the patient a few hours of relief before her symptoms returned.  Tamara Dodson does not feel that Tamara Dodson is significantly better.  Tamara Dodson did undergo endoscopic carpal tunnel release on the contralateral right hand last week.   12/27/2023: At her last visit with me approximately 9 months ago, her symptoms about the left shoulder seem to be primarily related to a cervical spine etiology.  Tamara Dodson underwent a course of formal physical therapy. Tamara Dodson also underwent epidural steroid injections to the  cervical spine.  Tamara Dodson then underwent C6/7 ACDF by Dr. Katrina on 09/04/2023.  Tamara Dodson was last seen by his office on 11/23/2023.  There was concern for numbness and tingling in the upper extremities  and an EMG/NCS was ordered.   Tamara Dodson states Tamara Dodson is having significant left shoulder pain.  Pain hurts with almost any sort of motion.  It is much more severe than what Tamara Dodson has experienced previously.  Tamara Dodson states that Tamara Dodson feels that this has been going on since the time of her neck surgery, but has just steadily worsened.  Tamara Dodson feels like Tamara Dodson has lost a lot of motion.  04/04/23: Tamara Dodson is now approximately 7.5 months after undergoing above procedures.  Tamara Dodson obtained an MRI on the left shoulder and cervical spine in the interim.  Tamara Dodson states that Tamara Dodson is having significant pain about the shoulder and neck region on the left side, even at rest.  Tamara Dodson describes her pain as a burning type sensation over the anterior aspect of the shoulder, lateral aspect of the shoulder and arm down to the elbow, and a shooting type pain about her shoulder blades.  Tamara Dodson also notes significant sensitivity to touch diffusely about the shoulder.  02/01/23: At her last visit with me Tamara Dodson was given a Medrol Dosepak and started on naproxen.  Tamara Dodson did get some relief while Tamara Dodson was on the Medrol Dosepak, but symptoms quickly returned.  Tamara Dodson does not feel like naproxen is making a difference in her symptoms.  Tamara Dodson did not get significant relief from this.  Tamara Dodson then underwent ultrasound-guided subacromial bursa injection on the left side by Dr. Sharrie on 12/13/2022.  Tamara Dodson had approximately 2 weeks of partial relief with that injection before her symptoms worsened again.  Tamara Dodson also notes a burning/shooting type pain rating up across to the right shoulder as well as pain shooting down her whole arm into her fingertips occasionally.  11/09/22: Tamara Dodson is now almost 3 months after undergoing above procedures.  Tamara Dodson had been doing well until approximately 3 weeks ago.  Tamara Dodson started noticing some increased pain with attempted forward flexion.  Tamara Dodson states Tamara Dodson does not have pain with her elbow by the side and has significant pain when Tamara Dodson first attempts to initiate  forward flexion.  After Tamara Dodson had began to lift her arm, Tamara Dodson is able to take it over her head without significant difficulty.  Tamara Dodson also has significant pain when Tamara Dodson attempts to lower her arm.  Tamara Dodson cannot think of any significant instances that caused the onset of pain.  09/21/22: Now 6 weeks after undergoing above procedures.  Tamara Dodson continues to have minimal pain.  Tamara Dodson is pleased with her progress.  Tamara Dodson has been wearing sling as instructed and attending physical therapy as well.  08/25/22: The patient is now approximately 2 weeks after undergoing above procedures.  Tamara Dodson is quite pleased with the result of the surgery and Tamara Dodson now has no significant pain.  Tamara Dodson states that Tamara Dodson took narcotics for approximately 1 week before stopping.  Tamara Dodson was doing well in terms of her range of motion with physical therapy so Tamara Dodson will resume at the end of the month.  Tamara Dodson has been wearing the sling as instructed.  No significant concerns at this time.  08/03/22: Tamara Dodson is now 3.5 months after undergoing above procedures.  Tamara Dodson was given a combined glenohumeral joint and subacromial space corticosteroid injection at her last visit with me with concern for postoperative capsulitis.  Tamara Dodson states that it helped for a  few days before her symptoms returned.  Tamara Dodson states Tamara Dodson has significant pain throughout the day at rest, but it is worse with shoulder motion Tamara Dodson still is unable to lift her arm over her head completely due to pain.  07/07/22: Tamara Dodson is now 12 weeks after going above procedure.  Tamara Dodson obtained an MRI Of her left shoulder after being evaluated by Krystal Doyne, PA approximately 2 weeks ago.  Patient felt a pop after a physical therapy session and has had increased pain with difficulty being able to lift her arm up since that time.  Tamara Dodson has significant pain with almost any range of motion currently.  05/25/22: Tamara Dodson is now 6 weeks after undergoing above procedures.  Tamara Dodson reports minimal pain at baseline other than an occasional shooting pain  down the arm.  Tamara Dodson has been attending physical therapy and performing home exercises as instructed.  No significant concerns at this time.  04/27/22: The patient is now approximately 2 weeks after undergoing above procedures.  Pain level is improving.  The patient has been attending physical therapy and wearing the sling as instructed.  Tamara Dodson has some sensations of shooting pain down the arm as well as numbness and tingling in the fingers.  04/05/22: Tamara Dodson is a 63 y.o. female  referred by Self for L shoulder evaluation and management.  Prior medical records were reviewed.  Tamara Dodson was recently evaluated by Dr. Sharrie on 03/17/2022.  From his note: They were last evaluated by myself on 11/24/2021 for left shoulder pains which were suspected to be due to subacromial impingement.  At that time, the plan was to perform a subacromial bursa steroid injection since Tamara Dodson had not had improvement with other conservative treatments.  Tamara Dodson reports that this injection only helped for about 1 week.  There was also some possibility that her pains could be due to a cervical spine issue.  Tamara Dodson subsequently had a C5-6 ACDF by Dr. Clois on 01/26/2022 suspected be due to myelopathy.  At her most recent follow-up with Dr. Clois, Tamara Dodson was still reporting some left shoulder pains so Tamara Dodson was recommended follow-up with orthopedics.  Tamara Dodson was additionally also reporting right shoulder pain and so we are evaluating this as a new problem.   There is no new data to be reviewed side from recent neurosurgical notes.   Today, the patient reports their symptoms are most prevalent around her left greater than right lateral shoulder.  Tamara Dodson can have some symptoms into her upper trapezius area and scapula but most of her pains are along her lateral shoulder.  Her symptoms are exacerbated by overhead activity, trying to sleep.  Tamara Dodson has not had any new accident, injury, trauma to her arms but Tamara Dodson did have another cervical spine surgery as  described above.  Tamara Dodson currently rates pain severity as a 8/10.  Tamara Dodson reports associated left shoulder clicking and pain at night.  Tamara Dodson denies associated swelling, bruising, skin color change, locking, instability, limping, difficulty walking, numbness/tingling, weakness, fever/chills, nausea, night sweats, weight loss.  Tamara Dodson has tried chronic medications gabapentin/methocarbamol /nortriptyline, postsurgical medication oxycodone /celecoxib , ibuprofen, activity modification, heat, ice, physical therapy.   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    Tamara Dodson stays active with walking.   Tamara Dodson reports that her pain is tolerable but Tamara Dodson makes concessions to her activities to avoid pain.   An MRI was obtained due to failure of conservative management for over 3 months and due to weakness to rotator cuff testing.  Tamara Dodson was then referred to me for further evaluation and management.  Tamara Dodson currently rates pain severity as a 10/10 at its worst. Her symptoms began in October 2022 without known traumatic event.  Tamara Dodson has had the above described conservative treatments including medications, home exercises, activity modifications, and corticosteroid injections.  Symptoms are worsened with movement of the shoulder, especially overhead and away from the body such as when Tamara Dodson is driving or reaching behind her back.    Of note, Tamara Dodson has had bilateral hip and bilateral knee replacements.  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 Katrina.  Last surgery was in February 2023.  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  Tamara Dodson has never smoked.  PMHx, PSurgHx, Fam Hx, Soc Hx, Meds, Allergies: Past Medical History:  Diagnosis Date  . Anemia   . Cirrhosis (CMS/HHS-HCC)   . Enthesopathy of hip   . Fatty liver   . Headache   . Hypertension   . Lumbago   . Lymphedema    Bilateral lower extremities  . Morbid obesity (CMS-HCC)   . Osteoarthrosis, unspecified  whether generalized or localized, lower leg   . Varicella   . Vertigo     Past Surgical History:  Procedure Laterality Date  . CHOLECYSTECTOMY  1983  . Tdap   07/2009  . Left total hip arthroplasty  02/14/2011   Dr Mardee  . KNEE ARTHROSCOPY Right 09/14/2011   partial medial and lateral meniscectomies, chondroplasty  . Left total knee arthroplasty  02/18/2013   Dr Mardee  . COLONOSCOPY  08/22/2013   Int/Ext Hemorrhoids: CBF 08/2023: (Updated CBF to reflect 10 years) Recall ltr mailed 06/11/18 (kj)  . Right total knee arthroplasty using computer assisted navigation  09/21/2015   Dr.Hooten   . Right total hip arthroplasty  11/17/2017   Dr Mardee  . EGD  01/25/2021   Peptic duodenitis/Gastritis/Repeat 84yr/TKT  . C3-5 ANTERIOR CERVICAL DECOMPRESSION/DISCECTOMY FUSION  02/24/2021   Dr. Reeves Daisy at [facility], [place]  . C5-6 ANTERIOR CERVICAL DISCECTOMY & FUSION  01/26/2022   Dr. Reeves Daisy at [facility], [place]  . Lt arthroscopic rotator cuff repair using Regeneten patch, biceps tenodesis, extensive debridement of shoulder (glenohumeral & subacromial spaces) subacromial decompression  04/11/2022   Dr. Tobie  . Lt arthroscopic rotator cuff repair, subacromial decompression, extensive debridement of shoulder (glenohumeral & subacromial spaces) distal clavicle excision Left 08/11/2022   Dr. Tobie  . Colon @ [facility]  03/22/2023   Tubular adenoma/Repeat 71yrs/TKT  . EGD @ [facility]  03/22/2023   Gastritis/No repeat/TKT  . right sided carpal tunnel decompression with ultrasound guidance Right 01/23/2024   Penne Sharps, MD  . CARPAL TUNNEL RELEASE Left 03/05/2024  . Lt shoulder capsular releases w/ synovectomy, lysis of adhesions, manipulation under anesthesia, loose body removal, extensive glenohumeral debridement, glenohumeral & subacromial injections w/ corticosteriod Left 03/12/2024   Dr. Tobie  . Trigger Thumb Repair Right 06/24/2024   Dr. Ozell Flake  . TONSILLECTOMY      Family  History  Problem Relation Age of Onset  . Diabetes type II Mother   . Breast cancer Mother   . High blood pressure (Hypertension) Mother   . Other Mother        Renal failure  . High blood pressure (Hypertension) Father   . Osteoarthritis Father     Social History   Socioeconomic History  . Marital status: Married    Spouse name: Ozell  . Number of children:  3  . Years of education: 55  Occupational History  . Occupation: Retired    Comment: [employer]  Tobacco Use  . Smoking status: Never  . Smokeless tobacco: Never  Vaping Use  . Vaping status: Never Used  Substance and Sexual Activity  . Alcohol use: No    Alcohol/week: 0.0 standard drinks of alcohol  . Drug use: No  . Sexual activity: Yes    Partners: Male   Social Drivers of Health   Financial Resource Strain: Low Risk  (08/21/2024)   Overall Financial Resource Strain (CARDIA)   . Difficulty of Paying Living Expenses: Not very hard  Food Insecurity: No Food Insecurity (08/21/2024)   Hunger Vital Sign   . Worried About Programme researcher, broadcasting/film/video in the Last Year: Never true   . Ran Out of Food in the Last Year: Never true  Transportation Needs: No Transportation Needs (08/21/2024)   PRAPARE - Transportation   . Lack of Transportation (Medical): No   . Lack of Transportation (Non-Medical): No  Social Connections: Unknown (09/24/2019)   Received from [facility]   Social Connection and Isolation Panel   . In a typical week, how many times do you talk on the phone with family, friends, or neighbors?: More than three times a week  Housing Stability: Low Risk  (08/21/2024)   Housing Stability Vital Sign   . Unable to Pay for Housing in the Last Year: No   . Number of Times Moved in the Last Year: 0   . Homeless in the Last Year: No     Current Outpatient Medications  Medication Sig Dispense Refill  . aspirin  81 MG EC tablet Take 81 mg by mouth once daily    . baclofen (LIORESAL) 10 MG tablet TAKE 1 TABLET BY MOUTH TWICE  DAILY (LIMIT ONE TO TWO TREATMENTS PER WEEK)    . famotidine  (PEPCID ) 20 MG tablet Take 20 mg by mouth 2 (two) times daily    . FUROsemide  (LASIX ) 20 MG tablet Take 1 tablet (20 mg total) by mouth 2 (two) times daily 180 tablet 3  . gabapentin (NEURONTIN) 400 MG capsule TAKE 1 CAPSULE BY MOUTH TWICE DAILY AND 2 AT BEDTIME 120 capsule 0  . losartan (COZAAR) 50 MG tablet Take 1 tablet by mouth once daily 90 tablet 0  . meclizine (ANTIVERT) 12.5 mg tablet Take 12.5 mg 3 times a day 90 tablet 3  . propranoloL (INDERAL) 60 MG tablet Take 60 mg by mouth 2 (two) times daily    . rimegepant (NURTEC ODT) 75 mg disintegrating tablet Take 75 mg once daily as needed for headache rescue 8 tablet 5  . traZODone (DESYREL) 150 MG tablet Take 1 tablet (150 mg total) by mouth at bedtime 90 tablet 3  . triamcinolone  0.025 % ointment Apply topically 2 (two) times daily 30 g 0  . venlafaxine (EFFEXOR-XR) 150 MG XR capsule Take 150mg  once daily 30 capsule 3  . zonisamide (ZONEGRAN) 25 MG capsule Take 200 mg by mouth once daily    . zonisamide (ZONEGRAN) 100 MG capsule Take 200 mg by mouth once daily     No current facility-administered medications for this visit.    Allergies  Allergen Reactions  . Gluten Protein Diarrhea    Nausea and bloating    Review of Systems: A 10+ ROS was performed, reviewed, and the pertinent orthopaedic findings are documented in the HPI.  I have reviewed and agree with the ROS captured by the CMA.  Physical Exam: Vitals:   10/02/24 1351  BP: 124/74   General/Constitutional: NAD, conversant Eyes: Pupils equal and round, extraocular movements intact ENT: atraumatic external nose and ears, moist mucous membranes Respiratory: non-labored breathing, symmetric chest rise, chest sounds clear. Cardiovascular: no visible lower extremity edema, peripheral pulses present, regular rate and rhythm  Skin: normal skin turgor, warm and dry Neurological: cranial nerves grossly intact,  sensation grossly intact Psychological:  Appropriate mood and affect; appropriate judgment Musculoskeletal: as detailed below: Operative Shoulder Exam:  ROM   Right Left  Active (Passive) Forward Elevation  150 60 (80)  ER 50 10 (15)  IR T6 PSIS  90 degree abduction ER/IR 90/45 60 deg abd: 30/10  Abduction 120 60                                                                                       Tenderness                                         Right Left   Anterior capsule    Inspection   Right Left  Skin   incisions well-healed  Scapular Kinetics    Atrophy        Impingement / Rotator Cuff   Right Left  Neer Impingement    Hawkins    Champagne Toast     Empty Can/Jobe's 5/5 5/5  ER Strength 5/5 5/5  Bear Hug 5/5 5/5  Belly Press    Hornblower's    ER Lag      AC / Biceps  / SLAP   Right Left  Speed's    Yergason's    O'Brien's    Cross Arm Adduction      Neurovascular   Right Left  Distal Motor Normal Normal  Distal Sensation Normal Normal  Distal Pulse Normal Normal     Imaging:  L Shoulder radiographs:  03/17/2022: On personal read, there are no fractures or dislocations.  There are no significant degenerative changes.  There are mild-moderate degenerative changes of the acromioclavicular joint.  02/14/23: 3 views of the left shoulder (true AP, axillary, Y) were obtained.  There are no fractures or dislocations.  There are no notable degenerative changes of the glenohumeral joint.  Apparent distal clavicle excision has been performed.  Cervical spine radiographs: 02/14/23: 2 views of the cervical spine (AP/lat) were obtained.  There are no apparent fractures or disocations.  There is prior ACDF from C3-5 and another from C5-6.  This has appeared to have fused.  C6/7 and C7-T1 is not well-visualized.  L Shoulder MRI: 04/01/22: FINDINGS:  Rotator cuff: Severe supraspinatus tendinopathy with high-grade  partial-thickness articular surface tear about  the footprint.  Tendinopathy of the infraspinatus with high-grade partial-thickness  articular surface tear about the footprint. Teres minor tendon is  intact. Tendinopathy of the subscapularis without tear   Muscles: No muscle atrophy or edema. No intramuscular fluid  collection or hematoma.   Biceps Long Head: Intraarticular and extraarticular portions of the  biceps tendon are intact.   Acromioclavicular Joint:  Moderate arthropathy of the  acromioclavicular joint. No subacromial/subdeltoid bursal fluid.   Glenohumeral Joint: No joint effusion. No chondral defect.   Labrum: Grossly intact, but evaluation is limited by lack of  intraarticular fluid/contrast.   Bones: No fracture or dislocation. No aggressive osseous lesion.   Other: No fluid collection or hematoma.   IMPRESSION:  1. Tendinopathy of the supraspinatus and infraspinatus tendons with  high-grade partial-thickness (greater than 50%) articular-surface  tear about the footprint.   2.  No muscle atrophy.   3.  Moderate acromioclavicular osteoarthritis.   4.  No significant glenohumeral osteoarthritis or joint effusion.   5.  No evidence of fracture or osseous lesion.   6.  Skin and subcutaneous soft tissues are within normal limits.   Additionally, on personal read, there is mild biceps tendinopathy.  Rotator cuff tissue appears significantly tendinopathic with high-grade partial-thickness tearing involving the supraspinatus and infraspinatus.   07/01/22: FINDINGS:  Rotator cuff: Prominent supraspinatus tendinopathy with moderate to  prominent infraspinatus and mild subscapularis tendinopathy. Partial  thickness articular surface tearing of the distal anterior  supraspinatus tendon as on image 12 series 7. No full-thickness  rotator cuff tear is observed.   Muscles:  Unremarkable   Biceps long head: Prior biceps tenodesis. Severe indistinctness of  the tendon at the PushLock anchor site, possibly  completely torn.  Further distally in the bicipital groove, the biceps tendon is  expanded and irregular for example on image 13 of series 7.   Acromioclavicular Joint: Interval subacromial decompression,  resulting type II configuration of the acromion without substantial  inferior spurring. Fluid signal intensity is present along the [facility]  joint. The patient underwent subacromial subdeltoid bursectomy  although there is some fluid outside of the rotator cuff and below  the deltoid as shown on image 11 series 7.   Glenohumeral Joint: Mild degenerative chondral thinning. Small  glenohumeral joint effusion.   Labrum:  Grossly intact   Bones: No significant extra-articular osseous abnormalities  identified.   Other: No supplemental non-categorized findings.   IMPRESSION:  1. Rotator cuff tendinopathy most prominently in the supraspinatus  tendon. There is a small recurrent partial thickness articular  surface tear along the distal anterior supraspinatus tendon shown  for example on image 12 series 7.  2. Prior biceps tenodesis. Severe indistinctness of the long head  biceps tendon at the anchor site, concerning for possible complete  tear. Further distally in the bicipital groove, the long head tendon  is expanded and irregular with abnormal increased signal.  3. Small amount of fluid between the rotator cuff and subdeltoid  region although no full-thickness rotator cuff tear is identified.  This would typically correspond to the subacromial subdeltoid bursa  although by report the patient had prior bursectomy.  4. Small glenohumeral joint effusion.  5. Mild degenerative chondral thinning in the glenohumeral joint.   On personal read of the MRI from 07/01/2022, the patient has a new partial-thickness articular sided tear of the anterior supraspinatus that was not evident on prior MRI.  This appears to involve over 50% of the footprint on the articular side.  Additionally, there is  significant subacromial fluid   03/01/23: FINDINGS:  Rotator cuff: Prior cuff repair. Mild distal supraspinatus  tendinosis there is moderate tendinosis of the distal infraspinatus  tendon with articular sided fraying and intermediate signal, but no  fluid intensity signal to suggest recurrent tearing. Teres minor  tendon is intact. Subscapularis tendon is intact.   Muscles: No significant muscle  atrophy.   Biceps Long Head: Prior biceps tenodesis.   Acromioclavicular Joint: Prior subacromial decompression and distal  clavicle resection.   Glenohumeral Joint: The no significant joint effusion. Mild  chondrosis.   Labrum: No evidence of labral tear on non-arthrogram MRI.   Bones: No evidence of acute fracture.  No aggressive osseous lesion.   Other: No focal fluid collection.   IMPRESSION:  Prior cuff repair. Mild distal supraspinatus tendinosis. Moderate  distal infraspinatus tendinosis with articular sided fraying. No  evidence of recurrent high-grade cuff tear. No significant muscle  atrophy.   Mild glenohumeral osteoarthritis.   Prior biceps tenodesis.    08/26/24: FINDINGS:   Bones: Stable likely postsurgical changes in the [facility] joint. There is mild edema.  Trace subacromial fluid. Multiple osseous suture anchors are identified in the  lateral humeral head consistent with prior rotator cuff repair and biceps  tendon tenodesis. Mild glenohumeral arthrosis with chondromalacia and joint  space narrowing.   Rotator cuff: There is no significant change in the appearance of the rotator  cuff. No full-thickness or retracted tear is present. Mild postoperative signal  is relatively stable. Subscapularis tendon is intact. Mild fatty atrophy of the  supraspinatus muscle.   Labrum and biceps tendon: Biceps tenodesis is identified. The biceps tendon is  stable in the bicipital groove. Degenerative changes are seen in the anterior  and posterior labrum mild blunting globular  appearance. No discrete labral tear  is appreciated. There is blunting and irregularity of the labrum which is  chronic.    IMPRESSION:  Relatively stable postoperative changes with prior rotator cuff repair and  biceps tenodesis. Mild postoperative signal is seen in the distal rotator cuff  without evidence of a significant recurrent tear. No progression.   Biceps tenodesis is unchanged.   Degenerative changes are seen in the anterior posterior and superior labrum  without evidence of displaced labral tear.   Mild AC joint arthrosis and glenohumeral joint arthrosis is relatively stable.  There is joint space loss of the glenohumeral joint without evidence of  significant subchondral reactive edema or significant joint effusion.   No acute abnormality.    MRI C-spine: 03/01/23: IMPRESSION:  1. 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 Moderate left foraminal  stenosis. Mild spinal stenosis.  2. Anterior cervical fusion from C3 through C6. At C4-5 there is  moderate-severe left foraminal stenosis. At C5-6 there is moderate  bilateral foraminal stenosis.  3. No acute osseous injury of the cervical spine.     I personally reviewed and visualized the aforementioned imaging studies. I additionally personally interpreted any radiographs taken during today's visit.   Assessment & Plan: There are no diagnoses linked to this encounter.     TANDA MORRISSEY is a 63 y.o. female patient s/p L Regeneten patch application and arthroscopic biceps tenodesis on 04/11/2022 and s/p left arthroscopic rotator cuff repair and distal clavicle excision on 08/11/2022.  Tamara Dodson is now s/p left shoulder capsular release with manipulation anesthesia and removal of loose bodies on 03/12/2024.  Tamara Dodson was very pleased with the result of her surgery and had full function until Tamara Dodson developed recurrent adhesive capsulitis.  This was resolved with  combined glenohumeral joint/subacromial space corticosteroid injections.  Right shoulder that was more painful has been improved with subacromial space corticosteroid injection.  Tamara Dodson has not developed a recurrent adhesive capsulitis of the left shoulder. 1.  We had a lengthy discussion regarding the clinical  findings as well as as the findings on her MRI.  At this point, Tamara Dodson has developed a recurrent adhesive capsulitis despite prior corticosteroid injections and arthroscopic lysis of adhesions and manipulation under anesthesia.  Tamara Dodson did regain full function of her shoulder and was quite pleased with the result of the surgery before having this most recent recurrence.  Tamara Dodson was given a corticosteroid injection with improvement in her motion temporarily before adhesive capsulitis returned.  At this point, the patient is quite frustrated with recurrent adhesive capsulitis as it is severely painful and limiting almost all the daily functions as well as affecting her sleep. 2.  We discussed the various treatment options for this including both nonsurgical management as well as surgical management options.  Nonsurgical management would consist of repeat corticosteroid injections or other modalities including hydrodilatation, stretching exercises, and/or physical formal physical therapy.  Given multiple recurrences patient does not wish to continue further nonsurgical management.  We discussed the possible surgical management options.  This could consist of repeat arthroscopic lysis of adhesions and manipulation under anesthesia.  However, patient is quite concerned with risk of recurrence given that this is already happened once.  We also discussed that her most recent MRI showed significant degenerative changes of the glenohumeral joint with complete loss of cartilage on both the humeral head and glenoid.  These findings were not present at the time of prior arthroscopy in March 2025.  Given these findings, we  discussed that total shoulder arthroplasty may be an option.  However prior to this, I would like to ensure that there is no underlying infection given the patient's prior surgeries and progressive degenerative changes. 3.  Recommend obtaining an ESR and CRP as well as joint aspiration under ultrasound guidance.  I am referring her to Dr. Sharrie to have this procedure performed.  We will plan for a 14-day hold of aspirate results to evaluate for underlying C. acnes infection.  I have discussed this plan with Dr. Sharrie. 4.  I recommended a CT scan for preoperative planning purposes with [facility] blueprint protocol. 5.  We discussed that we can tentatively plan for left total shoulder arthroplasty to be performed on 10/22/2024.  We will also plan to obtain multiple intraoperative culture samples if preoperative aspirate sample was negative.   Over 70 minutes were spent on this patient visit including reviewing prior records, imaging studies including MRI and/or radiographs and associated reports, coordination of future care, documentation of today's visit, and discussion with and counseling the patient in regards to the clinical findings, diagnosis, and treatment options.

## 2024-10-02 ENCOUNTER — Inpatient Hospital Stay: Payer: Medicare Other | Admitting: Oncology

## 2024-10-02 ENCOUNTER — Encounter: Payer: Self-pay | Admitting: Oncology

## 2024-10-02 VITALS — BP 142/73 | HR 100 | Temp 97.9°F | Resp 18

## 2024-10-02 DIAGNOSIS — R7989 Other specified abnormal findings of blood chemistry: Secondary | ICD-10-CM

## 2024-10-02 DIAGNOSIS — Z148 Genetic carrier of other disease: Secondary | ICD-10-CM | POA: Diagnosis not present

## 2024-10-02 DIAGNOSIS — D802 Selective deficiency of immunoglobulin A [IgA]: Secondary | ICD-10-CM

## 2024-10-02 DIAGNOSIS — D751 Secondary polycythemia: Secondary | ICD-10-CM | POA: Diagnosis not present

## 2024-10-02 NOTE — Assessment & Plan Note (Signed)
 Patient was seen by immunology Dr. Tobie.

## 2024-10-02 NOTE — Progress Notes (Signed)
 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 Labs are reviewed and discussed with patient. Lab Results  Component Value Date   HGB 15.0 09/27/2024   TIBC 323 09/27/2024   IRONPCTSAT 34 (H) 09/27/2024   FERRITIN 205 09/27/2024     Elevated ferritin, trending down.  I will hold off phlebotomy.   Elevated LFTs Previous liver biopsy showed moderate parenchymal and sinusoidal siderosis Scattered iron granules are identified on the HE sections. The iron stain highlights moderate staining in sinusoidal and periportal parenchymal patterns of distribution. Patient has fatty liver disease. Recommend patient to follow up with GI Patient would like to defer until after her upcoming surgeries.  IgA deficiency Tamara [facility]) Patient was seen by immunology Dr. Tobie.  Orders Placed This Encounter  Procedures   CBC with Differential (Cancer Center Only)    Standing Status:   Future    Expected Date:   04/02/2025    Expiration Date:   07/01/2025   Iron and TIBC    Standing Status:   Future    Expected Date:   04/02/2025    Expiration Date:   07/01/2025   Ferritin    Standing Status:   Future    Expected Date:   04/02/2025    Expiration Date:   07/01/2025   Hepatic function panel    Standing Status:   Future    Expected Date:   04/02/2025    Expiration Date:   07/01/2025   Follow up 1 year All questions were answered. The patient knows to [facility] the clinic with any problems, questions or concerns.  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 female who was  seen in consultation at the request of Tamara Lynwood FALCON, MD for evaluation of hemochromatosis carrier, erythrocytosis Reviewed patient's recent lab work  06/27/2019 Labs showed elevated hemoglobin at 16.9,  total white count 8.5, platelet counts 225,000.  Chronic Onset, duration since 2016.  She does not smoke.  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   # Heterozygous H63D mutation.  She has a history of iron deficiency after hip replacement and she took oral iron supplementation which was discontinued.  09/24/2019 liver biopsy showed moderate parenchymal and sinusoidal siderosis, patient heterozygous for H63D gene.  Septal fibrosis with areas suggestive of early bridging fibrosis [at least stage II fibrosis]. Focal and minimal steatosis with no evidence of steatohepatitis.  Total NAFLD score 1 of 8. --Scattered iron granules are identified on the HE sections. The iron stain highlights moderate staining in sinusoidal and periportal parenchymal patterns of distribution  Lost follow up with me after her visit in September 2020 and restablished care 03/17/2021 per recommendation of gastroenterology and the neurosurgeon Dr. Katrina  Elevated Ferritin in the context of liver fibrosis- phlebotomy  02/24/2021 s/p ACDF C3-5  07/06/22 MRI abdomen with and without contrast  1.  Normal liver morphology. No gross evidence of hepatic iron deposition. Please see addendum for full iron quantification results. 2.  Mild splenomegaly.  0.4 mg Fe / g dry weight of liver (Normal <1.8 mg / g)   Intermittent neuropathy  of feet and mouth, migraine and vertigo  INTERVAL HISTORY Tamara Dodson is a 63 y.o. female who has above history reviewed by me today presents for follow up visit for management of heterozygous hemochromatosis mutation.  Liver fibrosis During the interval, patient has had cervical myelopathy surgeries, left shoulder surgeries. She was previously recommend to establish with GI  and prefers to defer due to above surgeries.     Review of Systems  Constitutional:  Positive for fatigue. Negative for appetite change, chills and fever.  HENT:   Negative for hearing loss and voice change.   Eyes:  Negative for eye problems.  Respiratory:  Negative for chest tightness and cough.   Cardiovascular:  Negative for chest pain.  Gastrointestinal:  Negative for abdominal distention, abdominal pain and blood in stool.  Endocrine: Negative for hot flashes.  Genitourinary:  Negative for difficulty urinating and frequency.   Musculoskeletal:  Positive for arthralgias.  Skin:  Negative for itching and rash.  Neurological:  Negative for extremity weakness.  Hematological:  Negative for adenopathy.  Psychiatric/Behavioral:  Negative for confusion.     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    Lumbago    Lymphedema of lower extremity    Migraines    Mitral valve insufficiency    Morbid obesity (HCC)    Neuropathy of both feet    Nonalcoholic fatty liver disease    Osteoarthritis    Portal venous hypertension (HCC)    Radiculopathy of cervical region    Right carpal tunnel syndrome 09/03/2021   Vertigo     SURGICAL HISTORY: Past Surgical History:  Procedure Laterality Date   ANTERIOR CERVICAL DECOMP/DISCECTOMY FUSION N/A 02/24/2021   Procedure: ANTERIOR CERVICAL DECOMPRESSION/DISCECTOMY FUSION 2 LEVELS C3-5;  Surgeon: Clois Fret, MD;  Location: [facility];  Service: Neurosurgery;  Laterality: N/A;   ANTERIOR CERVICAL  DECOMP/DISCECTOMY FUSION N/A 01/26/2022   Procedure: C5-6 ANTERIOR CERVICAL DISCECTOMY & FUSION (GLOBUS HEDRON);  Surgeon: Clois Fret, MD;  Location: [facility];  Service: Neurosurgery;  Laterality: N/A;   ANTERIOR CERVICAL DECOMP/DISCECTOMY FUSION N/A 09/04/2023   Procedure: C6-7 ANTERIOR CERVICAL DISCECTOMY AND FUSION (HEDRON);  Surgeon: Clois Fret, MD;  Location: [facility];  Service: Neurosurgery;  Laterality: N/A;   APPENDECTOMY     CARPAL TUNNEL RELEASE Right 01/23/2024   Procedure: RIGHT CARPAL TUNNEL RELEASE WITH ULTRASOUND GUIDANCE, POSSIBLE CONVERT TO OPEN;  Surgeon: Claudene Penne ORN, MD;  Location: [facility];  Service: Neurosurgery;  Laterality: Right;   CARPAL TUNNEL RELEASE Left 03/05/2024   Procedure: LEFT CARPAL TUNNEL RELEASE WITH ULTRASOUND GUIDANCE, POSSIBLE CONVERT TO OPEN;  Surgeon: Claudene Penne ORN, MD;  Location: [facility];  Service: Neurosurgery;  Laterality: Left;   CHOLECYSTECTOMY  1983   COLONOSCOPY  08/22/2013   COLONOSCOPY N/A 03/22/2023   Procedure: COLONOSCOPY;  Surgeon: Toledo, Ladell POUR, MD;  Location: [facility];  Service: Gastroenterology;  Laterality: N/A;   ESOPHAGOGASTRODUODENOSCOPY N/A 01/25/2021  Procedure: ESOPHAGOGASTRODUODENOSCOPY (EGD);  Surgeon: Toledo, Ladell POUR, MD;  Location: [facility];  Service: Gastroenterology;  Laterality: N/A;  C-19 test on 01/22/2021 Requesting to be the last case due to her transportation   ESOPHAGOGASTRODUODENOSCOPY N/A 03/22/2023   Procedure: ESOPHAGOGASTRODUODENOSCOPY (EGD);  Surgeon: Toledo, Ladell POUR, MD;  Location: [facility];  Service: Gastroenterology;  Laterality: N/A;   EXAM UNDER ANESTHESIA WITH MANIPULATION OF SHOULDER Left 03/12/2024   Procedure: EXAM UNDER ANESTHESIA, SHOULDER, WITH MANIPULATION;  Surgeon: Tamara Dodson Priest, MD;  Location: [facility];  Service: Orthopedics;  Laterality: Left;   KNEE ARTHROPLASTY Right 09/21/2015   Procedure: COMPUTER ASSISTED TOTAL KNEE ARTHROPLASTY;  Surgeon: Lynwood SHAUNNA Hue, MD;  Location: [facility];  Service: Orthopedics;  Laterality: Right;   KNEE ARTHROSCOPY Right 09/14/2011   POSTERIOR LUMBAR FUSION 2 WITH HARDWARE REMOVAL Left 03/12/2024   Procedure: ARTHROSCOPY, SHOULDER WITH DEBRIDEMENT;  Surgeon: Tamara Dodson Priest, MD;  Location: [facility];  Service: Orthopedics;  Laterality: Left;  Right shoulder arthroscopic lysis of adhesions, capsular release, and manipulation under anesthesia with corticosteroid injection   SHOULDER ARTHROSCOPY Left 04/11/2022   Procedure: Left shoulder arthroscopic regeneten patch, biceps tenodesis, subacromial decompression;  Surgeon: Tamara Dodson Priest, MD;  Location: [facility];  Service: Orthopedics;  Laterality: Left;   SHOULDER ARTHROSCOPY WITH OPEN ROTATOR CUFF REPAIR Left 08/11/2022   Procedure: Left revision shoulder arthroscopic rotator cuff repair and extensive glenohumeral debridement;  Surgeon: Tamara Dodson Priest, MD;  Location: [facility];  Service: Orthopedics;  Laterality: Left;  needs potassium draw   TONSILLECTOMY     TOTAL HIP ARTHROPLASTY Left 02/14/2011   TOTAL HIP ARTHROPLASTY Right 11/17/2017   Procedure: TOTAL HIP ARTHROPLASTY;  Surgeon: Hue Lynwood SHAUNNA, MD;  Location: [facility];  Service: Orthopedics;  Laterality: Right;   TOTAL KNEE ARTHROPLASTY Left 02/18/2013   WISDOM TOOTH EXTRACTION      SOCIAL HISTORY: Social History   Socioeconomic History   Marital status: Married    Spouse name: Ozell    Number of children: 3   Years of education: Not on file   Highest education level: Not on file  Occupational History   Not on file  Tobacco Use   Smoking status: Never    Passive exposure: Never   Smokeless tobacco: Never  Vaping Use   Vaping status: Never Used  Substance and Sexual Activity   Alcohol use: No   Drug use: No   Sexual activity: Yes  Other Topics Concern   Not on file  Social History Narrative   Not on file   Social Drivers of Health   Financial Resource Strain: Low Risk  (08/21/2024)    Received from [facility]   Overall Financial Resource Strain (CARDIA)    Difficulty of Paying Living Expenses: Not very hard  Food Insecurity: No Food Insecurity (08/21/2024)   Received from [facility]   Hunger Vital Sign    Within the past 12 months, you worried that your food would run out before you got the money to buy more.: Never true    Within the past 12 months, the food you bought just didn't last and you didn't have money to get more.: Never true  Transportation Needs: No Transportation Needs (08/21/2024)   Received from [facility] - Transportation    In the past 12 months, has lack of transportation kept you from medical appointments or from getting medications?: No    Lack of Transportation (Non-Medical): No  Physical Activity: Not  on file  Stress: Not on file  Social Connections: Unknown (09/24/2019)   Social Connection and Isolation Panel    Frequency of Communication with Friends and Family: More than three times a week    Frequency of Social Gatherings with Friends and Family: Not on file    Attends Religious Services: Not on file    Active Member of Clubs or Organizations: Not on file    Attends Banker Meetings: Not on file    Marital Status: Not on file  Intimate Partner Violence: Not At Risk (09/24/2019)   Humiliation, Afraid, Rape, and Kick questionnaire    Fear of Current or Ex-Partner: No    Emotionally Abused: No    Physically Abused: No    Sexually Abused: No    FAMILY HISTORY: Family History  Problem Relation Age of Onset   Breast cancer Mother        82's    ALLERGIES:  is allergic to gluten meal.  MEDICATIONS:  Current Outpatient Medications  Medication Sig Dispense Refill   acetaminophen  (TYLENOL ) 500 MG tablet Take 2 tablets (1,000 mg total) by mouth every 8 (eight) hours. 90 tablet 2   baclofen (LIORESAL) 10 MG tablet Take 10 mg by mouth 2 (two) times daily.      famotidine  (PEPCID ) 20 MG tablet Take 1 tablet (20 mg total) by mouth 2 (two) times daily. 30 tablet 3   furosemide  (LASIX ) 20 MG tablet Take 40 mg by mouth in the morning.     gabapentin (NEURONTIN) 400 MG capsule Take 400 mg by mouth See admin instructions. Take 1 capsule (400 mg) by mouth in the morning, take 1 capsule (400 mg) at night     losartan (COZAAR) 50 MG tablet Take 50 mg by mouth every morning.     meclizine (ANTIVERT) 12.5 MG tablet Take 12.5-25 mg by mouth See admin instructions. Take 2 tablets (25 mg) by mouth in the morning & take 1 tablet (12.5 mg) by mouth at night.     Rimegepant Sulfate 75 MG TBDP Take 75 mg once daily as needed for headache rescue     senna (SENOKOT) 8.6 MG TABS tablet Take 1 tablet (8.6 mg total) by mouth daily as needed for mild constipation. 30 tablet 0   traZODone (DESYREL) 150 MG tablet Take 150 mg by mouth at bedtime.     venlafaxine XR (EFFEXOR-XR) 75 MG 24 hr capsule Take 75 mg by mouth in the morning.     zonisamide (ZONEGRAN) 25 MG capsule Take 200 mg by mouth daily.     No current facility-administered medications for this visit.     PHYSICAL EXAMINATION: ECOG PERFORMANCE STATUS: 1 - Symptomatic but completely ambulatory Vitals:   10/02/24 1006 10/02/24 1018  BP: (!) 147/91 (!) 142/73  Pulse: 100   Resp: 18   Temp: 97.9 F (36.6 C)   SpO2: 98%    There were no vitals filed for this visit.   Physical Exam Constitutional:      General: She is not in acute distress.    Appearance: She is obese.  HENT:     Head: Normocephalic and atraumatic.  Eyes:     General: No scleral icterus.    Pupils: Pupils are equal, round, and reactive to light.  Cardiovascular:     Rate and Rhythm: Normal rate and regular rhythm.     Heart sounds: Normal heart sounds.  Pulmonary:     Effort: Pulmonary effort is normal. No respiratory distress.  Breath sounds: No wheezing.  Abdominal:     General: Bowel sounds are normal. There is no distension.      Palpations: Abdomen is soft.     Tenderness: There is no abdominal tenderness.  Musculoskeletal:        General: No deformity. Normal range of motion.     Cervical back: Normal range of motion and neck supple.  Skin:    General: Skin is warm and dry.  Neurological:     Mental Status: She is alert and oriented to person, place, and time.     Cranial Nerves: No cranial nerve deficit.     Coordination: Coordination normal.  Psychiatric:        Mood and Affect: Mood normal.     RADIOGRAPHIC STUDIES: I have personally reviewed the radiological images as listed and agreed with the findings in the report. MM 3D DIAGNOSTIC MAMMOGRAM UNILATERAL LEFT BREAST Result Date: 09/12/2024 CLINICAL DATA:  LEFT asymmetry callback EXAM: DIGITAL DIAGNOSTIC UNILATERAL LEFT MAMMOGRAM WITH TOMOSYNTHESIS AND CAD TECHNIQUE: Left digital diagnostic mammography and breast tomosynthesis was performed. The images were evaluated with computer-aided detection. COMPARISON:  Previous exam(s). ACR Breast Density Category b: There are scattered areas of fibroglandular density. FINDINGS: The previously described finding does not persist with additional views, consistent with superimposed fibroglandular tissue. Fibroglandular tissue assumes a configuration stable compared to more remote prior mammograms. No suspicious mass, microcalcification, or other finding is identified. IMPRESSION: No mammographic evidence of malignancy. RECOMMENDATION: Screening mammogram in one year.(Code:SM-B-01Y) I have discussed the findings and recommendations with the patient. If applicable, a reminder letter will be sent to the patient regarding the next appointment. BI-RADS CATEGORY  1: Negative. Electronically Signed   By: Corean Salter M.D.   On: 09/12/2024 16:03   MM 3D SCREENING MAMMOGRAM BILATERAL BREAST Result Date: 09/09/2024 CLINICAL DATA:  Screening. EXAM: DIGITAL SCREENING BILATERAL MAMMOGRAM WITH TOMOSYNTHESIS AND CAD TECHNIQUE:  Bilateral screening digital craniocaudal and mediolateral oblique mammograms were obtained. Bilateral screening digital breast tomosynthesis was performed. The images were evaluated with computer-aided detection. COMPARISON:  Previous exam(s). ACR Breast Density Category b: There are scattered areas of fibroglandular density. FINDINGS: In the left breast, a possible focal asymmetry warrants further evaluation. In the right breast, no findings suspicious for malignancy. IMPRESSION: Further evaluation is suggested for possible focal asymmetry in the left breast. RECOMMENDATION: Diagnostic mammogram and possibly ultrasound of the left breast. (Code:FI-L-46M) The patient will be contacted regarding the findings, and additional imaging will be scheduled. BI-RADS CATEGORY  0: Incomplete: Need additional imaging evaluation. Electronically Signed   By: Corean Salter M.D.   On: 09/09/2024 13:03   MR CERVICAL SPINE WO CONTRAST Result Date: 08/31/2024 CLINICAL DATA:  Initial evaluation for acute neck pain, prior surgery. Increased right shoulder pain for 2-3 weeks. EXAM: MRI CERVICAL SPINE WITHOUT CONTRAST TECHNIQUE: Multiplanar, multisequence MR imaging of the cervical spine was performed. No intravenous contrast was administered. COMPARISON:  Comparison made with prior MRI from 03/01/2023. FINDINGS: Alignment: Straightening of the normal cervical lordosis. No interval listhesis or malalignment. Vertebrae: Prior ACDF at C3-C7, new at C6-7 from prior. Vertebral body height maintained without acute or interval fracture. Bone marrow signal intensity within normal limits. No worrisome osseous lesions. No abnormal marrow edema. Cord: Normal signal and morphology. Posterior Fossa, vertebral arteries, paraspinal tissues: Unremarkable. Disc levels: C2-C3: Normal interspace. Minimal right-sided facet hypertrophy. No canal or foraminal stenosis. C3-C4: Prior fusion. No residual spinal stenosis. Uncovertebral spurring with  residual mild left C4 foraminal stenosis. Right  neural foramina remains patent. C4-C5: Prior fusion. No residual spinal stenosis. Uncovertebral spurring with residual moderate left C5 foraminal narrowing. Right neural foramen remains patent. C5-C6: Prior fusion. Right paracentral endplate spur indents the ventral thecal sac with residual mild flattening of the right hemi cord, but no cord signal changes. No significant residual spinal stenosis. Uncovertebral spurring with residual moderate bilateral C6 foraminal narrowing. C6-C7: Prior fusion. No residual spinal stenosis. Uncovertebral spurring with residual moderate left C7 foraminal narrowing. Right neural foramina remains patent. C7-T1: Small central disc protrusion minimally indents the ventral thecal sac. Mild right-sided facet arthrosis. No spinal stenosis. Foramina remain patent. Appearance is relatively stable. Moderate multilevel facet arthrosis noted within the visualized upper thoracic spine. IMPRESSION: 1. Prior ACDF at C3-C7, new at C6-7 from prior. No residual spinal stenosis. 2. Uncovertebral spurring with residual moderate left C5, bilateral C6, and left C7 foraminal stenosis. 3. Small central disc protrusion at C7-T1 without stenosis or impingement, stable. Electronically Signed   By: Morene Hoard M.D.   On: 08/31/2024 18:51   MR SHOULDER LEFT WO CONTRAST Result Date: 08/26/2024 MR SHOULDER WITHOUT IV CONTRAST LEFT COMPARISON: 03/01/2023 CLINICAL HISTORY: Multiple left shoulder surgeries with chronic shoulder pain. PULSE SEQUENCES: Ax PD FS, Sag T2 FS, Cor T1 & COR T2 FS FINDINGS: Bones: Stable likely postsurgical changes in the [place] [facility] joint. There is mild edema. Trace subacromial fluid. Multiple osseous suture anchors are identified in the lateral humeral head consistent with prior rotator cuff repair and biceps tendon tenodesis. Mild glenohumeral arthrosis with chondromalacia and joint space narrowing. Rotator cuff: There is no significant  change in the appearance of the rotator cuff. No full-thickness or retracted tear is present. Mild postoperative signal is relatively stable. Subscapularis tendon is intact. Mild fatty atrophy of the supraspinatus muscle. Labrum and biceps tendon: Biceps tenodesis is identified. The biceps tendon is stable in the bicipital groove. Degenerative changes are seen in the anterior and posterior labrum mild blunting globular appearance. No discrete labral tear is appreciated. There is blunting and irregularity of the labrum which is chronic. IMPRESSION: Relatively stable postoperative changes with prior rotator cuff repair and biceps tenodesis. Mild postoperative signal is seen in the distal rotator cuff without evidence of a significant recurrent tear. No progression. Biceps tenodesis is unchanged. Degenerative changes are seen in the anterior posterior and superior labrum without evidence of displaced labral tear. Mild AC joint arthrosis and glenohumeral joint arthrosis is relatively stable. There is joint space loss of the glenohumeral joint without evidence of significant subchondral reactive edema or significant joint effusion. No acute abnormality. Electronically signed by: Norleen Satchel MD 08/26/2024 05:27 PM EDT RP Workstation: [id number]     LABORATORY DATA:  I have reviewed the data as listed    Latest Ref Rng & Units 09/27/2024    1:19 PM 02/22/2024    1:29 PM 09/29/2023    1:33 PM  CBC  WBC 4.0 - 10.5 K/uL 11.9  9.9  8.0   Hemoglobin 12.0 - 15.0 g/dL 84.9  83.7  85.2   Hematocrit 36.0 - 46.0 % 44.5  48.8  43.9   Platelets 150 - 400 K/uL 168  171  204       Latest Ref Rng & Units 09/27/2024    1:19 PM 02/22/2024    1:29 PM 01/16/2024    1:06 PM  CMP  Glucose 70 - 99 mg/dL  889  81   BUN 8 - 23 mg/dL  17  22   Creatinine 9.55 -  1.00 mg/dL  9.34  9.37   Sodium 864 - 145 mmol/L  136  139   Potassium 3.5 - 5.1 mmol/L  3.8  4.0   Chloride 98 - 111 mmol/L  101  100   CO2 22 - 32 mmol/L  27  25    Calcium 8.9 - 10.3 mg/dL  9.0  9.4   Total Protein 6.5 - 8.1 g/dL 7.6     Total Bilirubin 0.0 - 1.2 mg/dL 1.3     Alkaline Phos 38 - 126 U/L 87     AST 15 - 41 U/L 32     ALT 0 - 44 U/L 20      Iron/TIBC/Ferritin/ %Sat    Component Value Date/Time   IRON 111 09/27/2024 1319   TIBC 323 09/27/2024 1319   FERRITIN 205 09/27/2024 1319   IRONPCTSAT 34 (H) 09/27/2024 1319

## 2024-10-02 NOTE — Assessment & Plan Note (Addendum)
 Heterozygous H53D mutation  MRI liver showed no iron overload. Labs are reviewed and discussed with patient. Lab Results  Component Value Date   HGB 15.0 09/27/2024   TIBC 323 09/27/2024   IRONPCTSAT 34 (H) 09/27/2024   FERRITIN 205 09/27/2024     Elevated ferritin, trending down.  I will hold off phlebotomy.

## 2024-10-02 NOTE — Assessment & Plan Note (Addendum)
 Previous liver biopsy showed moderate parenchymal and sinusoidal siderosis Scattered iron granules are identified on the HE sections. The iron stain highlights moderate staining in sinusoidal and periportal parenchymal patterns of distribution. Patient has fatty liver disease. Recommend patient to follow up with GI Patient would like to defer until after her upcoming surgeries.

## 2024-10-03 ENCOUNTER — Telehealth: Payer: Self-pay | Admitting: Neurosurgery

## 2024-10-03 ENCOUNTER — Other Ambulatory Visit: Payer: Self-pay | Admitting: Orthopedic Surgery

## 2024-10-03 ENCOUNTER — Ambulatory Visit
Admission: RE | Admit: 2024-10-03 | Discharge: 2024-10-03 | Disposition: A | Discharge status: Home / Self Care | Source: Ambulatory Visit | Attending: Orthopedic Surgery | Admitting: Orthopedic Surgery

## 2024-10-03 DIAGNOSIS — M19012 Primary osteoarthritis, left shoulder: Secondary | ICD-10-CM | POA: Insufficient documentation

## 2024-10-03 NOTE — Telephone Encounter (Signed)
 I would cancel PT for 10/21/24 and let us  know when she is stable from ortho standpoint for her to restart. We will need to do new orders.

## 2024-10-03 NOTE — Telephone Encounter (Signed)
 Patient is calling to let our office know that she is to begin physical therapy on 11/03 and is scheduled for a total shoulder replacement on 11/04. She would like to know if she should continue with physical therapy for her back or wait until she gets her shoulder taken care of. Please advise.

## 2024-10-04 ENCOUNTER — Encounter
Admission: RE | Admit: 2024-10-04 | Discharge: 2024-10-04 | Disposition: A | Discharge status: Home / Self Care | Source: Ambulatory Visit | Attending: Orthopedic Surgery | Admitting: Orthopedic Surgery

## 2024-10-04 ENCOUNTER — Encounter: Payer: Self-pay | Admitting: Orthopedic Surgery

## 2024-10-04 ENCOUNTER — Other Ambulatory Visit: Payer: Self-pay

## 2024-10-04 DIAGNOSIS — I10 Essential (primary) hypertension: Secondary | ICD-10-CM | POA: Insufficient documentation

## 2024-10-04 DIAGNOSIS — Z01812 Encounter for preprocedural laboratory examination: Secondary | ICD-10-CM

## 2024-10-04 DIAGNOSIS — Z01818 Encounter for other preprocedural examination: Secondary | ICD-10-CM | POA: Diagnosis not present

## 2024-10-04 DIAGNOSIS — Z79899 Other long term (current) drug therapy: Secondary | ICD-10-CM | POA: Diagnosis not present

## 2024-10-04 DIAGNOSIS — M19019 Primary osteoarthritis, unspecified shoulder: Secondary | ICD-10-CM | POA: Diagnosis not present

## 2024-10-04 HISTORY — DX: Selective deficiency of immunoglobulin a (iga): D80.2

## 2024-10-04 HISTORY — DX: Iron deficiency anemia, unspecified: D50.9

## 2024-10-04 HISTORY — DX: Genetic carrier of other disease: Z14.8

## 2024-10-04 LAB — URINALYSIS, ROUTINE W REFLEX MICROSCOPIC
Bilirubin Urine: NEGATIVE
Glucose, UA: NEGATIVE mg/dL
Hgb urine dipstick: NEGATIVE
Ketones, ur: NEGATIVE mg/dL
Nitrite: NEGATIVE
Protein, ur: NEGATIVE mg/dL
Specific Gravity, Urine: 1.013 (ref 1.005–1.030)
pH: 5 (ref 5.0–8.0)

## 2024-10-04 LAB — BASIC METABOLIC PANEL WITH GFR
Anion gap: 11 (ref 5–15)
BUN: 10 mg/dL (ref 8–23)
CO2: 22 mmol/L (ref 22–32)
Calcium: 8.9 mg/dL (ref 8.9–10.3)
Chloride: 106 mmol/L (ref 98–111)
Creatinine, Ser: 0.51 mg/dL (ref 0.44–1.00)
GFR, Estimated: >60 mL/min (ref >60)
Glucose, Bld: 139 mg/dL — ABNORMAL HIGH (ref 70–99)
Potassium: 3.5 mmol/L (ref 3.5–5.1)
Sodium: 139 mmol/L (ref 135–145)

## 2024-10-04 LAB — SURGICAL PCR SCREEN
MRSA, PCR: NEGATIVE
Staphylococcus aureus: POSITIVE — AB

## 2024-10-04 LAB — C-REACTIVE PROTEIN: CRP: 0.8 mg/dL (ref <1.0)

## 2024-10-04 LAB — SEDIMENTATION RATE: Sed Rate: 17 mm/h (ref 0–30)

## 2024-10-04 NOTE — Telephone Encounter (Signed)
 Patient advised.

## 2024-10-04 NOTE — Patient Instructions (Addendum)
 Your procedure is scheduled on:10-22-24 Tuesday Report to the Registration Desk on the 1st floor of the [facility].Then proceed to the 2nd floor Surgery Desk To find out your arrival time, please call [phone] between 1PM - 3PM on:10-21-24 Monday If your arrival time is 6:00 am, do not arrive before that time as the [facility] entrance doors do not open until 6:00 am.  REMEMBER: Instructions that are not followed completely may result in serious medical risk, up to and including death; or upon the discretion of your surgeon and anesthesiologist your surgery may need to be rescheduled.  Do not eat food after midnight the night before surgery.  No gum chewing or hard candies.  You may however, drink CLEAR liquids up to 2 hours before you are scheduled to arrive for your surgery. Do not drink anything within 2 hours of your scheduled arrival time.  Clear liquids include: - water  - apple juice without pulp - gatorade (not RED colors) - black coffee or tea (Do NOT add milk or creamers to the coffee or tea) Do NOT drink anything that is not on this list.  In addition, your doctor has ordered for you to drink the provided:  Ensure Pre-Surgery Clear Carbohydrate Drink Drinking this carbohydrate drink up to two hours before surgery helps to reduce insulin resistance and improve patient outcomes. Please complete drinking 2 hours before scheduled arrival time.  One week prior to surgery:Stop NOW (09-24-24) Stop Anti-inflammatories (NSAIDS) such as Advil, Aleve, Ibuprofen, Motrin, Naproxen, Naprosyn and Aspirin  based products such as Excedrin, Goody's Powder, BC Powder. Stop ANY OVER THE COUNTER supplements until after surgery.  You may however, continue to take Tylenol  if needed for pain up until the day of surgery.  Continue taking all of your other prescription medications up until the day of surgery.  ON THE DAY OF SURGERY ONLY TAKE THESE MEDICATIONS WITH SIPS OF WATER: -gabapentin  (NEURONTIN)  -meclizine (ANTIVERT)   No Alcohol for 24 hours before or after surgery.  No Smoking including e-cigarettes for 24 hours before surgery.  No chewable tobacco products for at least 6 hours before surgery.  No nicotine patches on the day of surgery.  Do not use any recreational drugs for at least a week (preferably 2 weeks) before your surgery.  Please be advised that the combination of cocaine and anesthesia may have negative outcomes, up to and including death. If you test positive for cocaine, your surgery will be cancelled.  On the morning of surgery brush your teeth with toothpaste and water, you may rinse your mouth with mouthwash if you wish. Do not swallow any toothpaste or mouthwash.  Use CHG Soap as directed on instruction sheet.  Do not wear jewelry, make-up, hairpins, clips or nail polish.  For welded (permanent) jewelry: bracelets, anklets, waist bands, etc.  Please have this removed prior to surgery.  If it is not removed, there is a chance that hospital personnel will need to cut it off on the day of surgery.  Do not wear lotions, powders, or perfumes.   Do not shave body hair from the neck down 48 hours before surgery.  Contact lenses, hearing aids and dentures may not be worn into surgery.  Do not bring valuables to the hospital. [facility] is not responsible for any missing/lost belongings or valuables.   Notify your doctor if there is any change in your medical condition (cold, fever, infection).  Wear comfortable clothing (specific to your surgery type) to the  hospital.  After surgery, you can help prevent lung complications by doing breathing exercises.  Take deep breaths and cough every 1-2 hours. Your doctor may order a device called an Incentive Spirometer to help you take deep breaths. When coughing or sneezing, hold a pillow firmly against your incision with both hands. This is called "splinting." Doing this helps protect your incision. It  also decreases belly discomfort.  If you are being admitted to the hospital overnight, leave your suitcase in the car. After surgery it may be brought to your room.  In case of increased patient census, it may be necessary for you, the patient, to continue your postoperative care in the Same Day Surgery department.  If you are being discharged the day of surgery, you will not be allowed to drive home. You will need a responsible individual to drive you home and stay with you for 24 hours after surgery.   If you are taking public transportation, you will need to have a responsible individual with you.  Please call the Pre-admissions Testing Dept. at [phone] if you have any questions about these instructions.  Surgery Visitation Policy:  Patients having surgery or a procedure may have two visitors.  Children under the age of 79 must have an adult with them who is not the patient.    Pre-operative 4 CHG Bath Instructions   You can play a key role in reducing the risk of infection after surgery. Your skin needs to be as free of germs as possible. You can reduce the number of germs on your skin by washing with CHG (chlorhexidine  gluconate) soap before surgery. CHG is an antiseptic soap that kills germs and continues to kill germs even after washing.   DO NOT use if you have an allergy  to chlorhexidine /CHG or antibacterial soaps. If your skin becomes reddened or irritated, stop using the CHG and notify one of our RNs at [phone].   Please shower with the CHG soap starting 4 days before surgery using the following schedule:     Please keep in mind the following:  DO NOT shave, including legs and underarms, starting the day of your first shower.   You may shave your face at any point before/day of surgery.  Place clean sheets on your bed the day you start using CHG soap. Use a clean washcloth (not used since being washed) for each shower. DO NOT sleep with pets once you start  using the CHG.   CHG Shower Instructions:  If you choose to wash your hair and private area, wash first with your normal shampoo/soap.  After you use shampoo/soap, rinse your hair and body thoroughly to remove shampoo/soap residue.  Turn the water OFF and apply about 3 tablespoons (45 ml) of CHG soap to a CLEAN washcloth.  Apply CHG soap ONLY FROM YOUR NECK DOWN TO YOUR TOES (washing for 3-5 minutes)  DO NOT use CHG soap on face, private areas, open wounds, or sores.  Pay special attention to the area where your surgery is being performed.  If you are having back surgery, having someone wash your back for you may be helpful. Wait 2 minutes after CHG soap is applied, then you may rinse off the CHG soap.  Pat dry with a clean towel  Put on clean clothes/pajamas   If you choose to wear lotion, please use ONLY the CHG-compatible lotions on the back of this paper.     Additional instructions for the day of surgery: DO NOT  APPLY any lotions, deodorants, cologne, or perfumes.   Put on clean/comfortable clothes.  Brush your teeth.  Ask your nurse before applying any prescription medications to the skin.      CHG Compatible Lotions   Aveeno Moisturizing lotion  Cetaphil Moisturizing Cream  Cetaphil Moisturizing Lotion  Clairol Herbal Essence Moisturizing Lotion, Dry Skin  Clairol Herbal Essence Moisturizing Lotion, Extra Dry Skin  Clairol Herbal Essence Moisturizing Lotion, Normal Skin  Curel Age Defying Therapeutic Moisturizing Lotion with Alpha Hydroxy  Curel Extreme Care Body Lotion  Curel Soothing Hands Moisturizing Hand Lotion  Curel Therapeutic Moisturizing Cream, Fragrance-Free  Curel Therapeutic Moisturizing Lotion, Fragrance-Free  Curel Therapeutic Moisturizing Lotion, Original Formula  Eucerin Daily Replenishing Lotion  Eucerin Dry Skin Therapy Plus Alpha Hydroxy Crme  Eucerin Dry Skin Therapy Plus Alpha Hydroxy Lotion  Eucerin Original Crme  Eucerin Original Lotion   Eucerin Plus Crme Eucerin Plus Lotion  Eucerin TriLipid Replenishing Lotion  Keri Anti-Bacterial Hand Lotion  Keri Deep Conditioning Original Lotion Dry Skin Formula Softly Scented  Keri Deep Conditioning Original Lotion, Fragrance Free Sensitive Skin Formula  Keri Lotion Fast Absorbing Fragrance Free Sensitive Skin Formula  Keri Lotion Fast Absorbing Softly Scented Dry Skin Formula  Keri Original Lotion  Keri Skin Renewal Lotion Keri Silky Smooth Lotion  Keri Silky Smooth Sensitive Skin Lotion  Nivea Body Creamy Conditioning Oil  Nivea Body Extra Enriched Lotion  Nivea Body Original Lotion  Nivea Body Sheer Moisturizing Lotion Nivea Crme  Nivea Skin Firming Lotion  NutraDerm 30 Skin Lotion  NutraDerm Skin Lotion  NutraDerm Therapeutic Skin Cream  NutraDerm Therapeutic Skin Lotion  ProShield Protective Hand Cream  Provon moisturizing lotion  Preparing for Total Shoulder Arthroplasty  Before surgery, you can play an important role by reducing the number of germs on your skin by using the following products:  Benzoyl Peroxide Gel  o Reduces the number of germs present on the skin  o Applied twice a day to shoulder area starting two days before surgery  Chlorhexidine  Gluconate (CHG) Soap  o An antiseptic cleaner that kills germs and bonds with the skin to continue killing germs even after washing  o Used for showering the night before surgery and morning of surgery  BENZOYL PEROXIDE 5% GEL                               Please do not use if you have an allergy  to benzoyl peroxide. If your skin becomes reddened/irritated stop using the benzoyl peroxide.  Starting two days before surgery, apply as follows:  1. Apply benzoyl peroxide in the morning and at night. Apply after taking a shower. If you are not taking a shower, clean entire shoulder front, back, and side along with the armpit with a clean wet washcloth.  2. Place a quarter-sized dollop on your shoulder and rub in  thoroughly, making sure to cover the front, back, and side of your shoulder, along with the armpit.  2 days before ____ AM ____ PM 1 day before ____ AM ____ PM  3. Do this twice a day for two days. (Last application is the night before surgery, AFTER using the CHG soap).  4. Do NOT apply benzoyl peroxide gel on the day of surgery.  Preoperative Educational Videos for Total Hip, Knee and Shoulder Replacements  To better prepare for surgery, please view our videos that explain the physical activity and discharge planning required to have the  best surgical recovery at [facility].  [email or web address]  Questions? Call [phone] or email [email or web address]       Community Resource Directory to address health-related social needs:  [email or web address]

## 2024-10-05 ENCOUNTER — Encounter: Payer: Self-pay | Admitting: Urgent Care

## 2024-10-05 ENCOUNTER — Ambulatory Visit: Payer: Self-pay | Admitting: Urgent Care

## 2024-10-06 LAB — URINE CULTURE: Culture: >=100000 — AB

## 2024-10-10 ENCOUNTER — Ambulatory Visit (INDEPENDENT_AMBULATORY_CARE_PROVIDER_SITE_OTHER): Payer: BC Managed Care – PPO | Admitting: Vascular Surgery

## 2024-10-21 MED ORDER — LACTATED RINGERS IV SOLN: INTRAVENOUS | Status: DC

## 2024-10-21 MED ORDER — TRANEXAMIC ACID-NACL 1000-0.7 MG/100ML-% IV SOLN
1000.0000 mg | INTRAVENOUS | Status: AC
  Administered 2024-10-22: 1000 mg via INTRAVENOUS

## 2024-10-21 MED ORDER — CHLORHEXIDINE GLUCONATE 0.12 % MT SOLN: 15.0000 mL | Freq: Once | OROMUCOSAL | Status: DC

## 2024-10-21 MED ORDER — CEFAZOLIN SODIUM-DEXTROSE 3-4 GM/150ML-% IV SOLN
3.0000 g | INTRAVENOUS | Status: AC
  Administered 2024-10-22: 3 g via INTRAVENOUS
  Filled 2024-10-21: qty 150

## 2024-10-21 MED ORDER — ORAL CARE MOUTH RINSE: 15.0000 mL | Freq: Once | OROMUCOSAL | Status: DC

## 2024-10-22 ENCOUNTER — Ambulatory Visit
Admission: RE | Admit: 2024-10-22 | Discharge: 2024-10-22 | Disposition: A | Discharge status: Home / Self Care | Attending: Orthopedic Surgery | Admitting: Orthopedic Surgery

## 2024-10-22 ENCOUNTER — Ambulatory Visit: Payer: Self-pay | Admitting: Urgent Care

## 2024-10-22 ENCOUNTER — Encounter
Admission: RE | Disposition: A | Discharge status: Home / Self Care | Payer: Self-pay | Source: Home / Self Care | Attending: Orthopedic Surgery

## 2024-10-22 ENCOUNTER — Ambulatory Visit

## 2024-10-22 ENCOUNTER — Other Ambulatory Visit: Payer: Self-pay

## 2024-10-22 ENCOUNTER — Encounter: Payer: Self-pay | Admitting: Orthopedic Surgery

## 2024-10-22 DIAGNOSIS — R8271 Bacteriuria: Secondary | ICD-10-CM

## 2024-10-22 DIAGNOSIS — Z01812 Encounter for preprocedural laboratory examination: Secondary | ICD-10-CM

## 2024-10-22 DIAGNOSIS — E66813 Obesity, class 3: Secondary | ICD-10-CM | POA: Insufficient documentation

## 2024-10-22 DIAGNOSIS — R8290 Unspecified abnormal findings in urine: Secondary | ICD-10-CM

## 2024-10-22 DIAGNOSIS — M67814 Other specified disorders of tendon, left shoulder: Secondary | ICD-10-CM | POA: Diagnosis not present

## 2024-10-22 DIAGNOSIS — M19012 Primary osteoarthritis, left shoulder: Secondary | ICD-10-CM | POA: Diagnosis present

## 2024-10-22 DIAGNOSIS — Z6841 Body Mass Index (BMI) 40.0 and over, adult: Secondary | ICD-10-CM | POA: Insufficient documentation

## 2024-10-22 DIAGNOSIS — Z79899 Other long term (current) drug therapy: Secondary | ICD-10-CM | POA: Insufficient documentation

## 2024-10-22 DIAGNOSIS — M75102 Unspecified rotator cuff tear or rupture of left shoulder, not specified as traumatic: Secondary | ICD-10-CM | POA: Diagnosis not present

## 2024-10-22 DIAGNOSIS — I10 Essential (primary) hypertension: Secondary | ICD-10-CM | POA: Diagnosis not present

## 2024-10-22 DIAGNOSIS — K7460 Unspecified cirrhosis of liver: Secondary | ICD-10-CM | POA: Diagnosis not present

## 2024-10-22 HISTORY — PX: TOTAL SHOULDER ARTHROPLASTY: SHX126

## 2024-10-22 HISTORY — PX: BICEPT TENODESIS: SHX5116

## 2024-10-22 HISTORY — DX: Carpal tunnel syndrome, bilateral upper limbs: G56.03

## 2024-10-22 LAB — GLUCOSE, CAPILLARY: Glucose-Capillary: 187 mg/dL — ABNORMAL HIGH (ref 70–99)

## 2024-10-22 SURGERY — ARTHROPLASTY, SHOULDER, TOTAL
Anesthesia: General | Site: Shoulder | Laterality: Left

## 2024-10-22 MED ORDER — BUPIVACAINE LIPOSOME 1.3 % IJ SUSP
INTRAMUSCULAR | Status: AC
  Filled 2024-10-22: qty 20

## 2024-10-22 MED ORDER — MIDAZOLAM HCL 2 MG/2ML IJ SOLN
INTRAMUSCULAR | Status: AC
  Filled 2024-10-22: qty 2

## 2024-10-22 MED ORDER — ONDANSETRON 4 MG PO TBDP: 4.0000 mg | ORAL_TABLET | Freq: Three times a day (TID) | ORAL | 0 refills | Status: DC | PRN

## 2024-10-22 MED ORDER — PROPOFOL 10 MG/ML IV BOLUS
INTRAVENOUS | Status: AC
  Filled 2024-10-22: qty 20

## 2024-10-22 MED ORDER — ONDANSETRON HCL 4 MG/2ML IJ SOLN
INTRAMUSCULAR | Status: DC | PRN
  Administered 2024-10-22: 4 mg via INTRAVENOUS

## 2024-10-22 MED ORDER — LIDOCAINE HCL (PF) 1 % IJ SOLN
INTRAMUSCULAR | Status: AC
  Filled 2024-10-22: qty 5

## 2024-10-22 MED ORDER — CEFAZOLIN SODIUM-DEXTROSE 2-4 GM/100ML-% IV SOLN
INTRAVENOUS | Status: AC
  Filled 2024-10-22: qty 100

## 2024-10-22 MED ORDER — BUPIVACAINE LIPOSOME 1.3 % IJ SUSP
INTRAMUSCULAR | Status: AC
  Filled 2024-10-22: qty 10

## 2024-10-22 MED ORDER — SODIUM CHLORIDE 0.9 % IR SOLN
Status: DC | PRN
  Administered 2024-10-22: 1000 mL

## 2024-10-22 MED ORDER — ASPIRIN 325 MG PO TBEC: 325.0000 mg | DELAYED_RELEASE_TABLET | Freq: Every day | ORAL | 0 refills | Status: AC

## 2024-10-22 MED ORDER — TRANEXAMIC ACID-NACL 1000-0.7 MG/100ML-% IV SOLN
INTRAVENOUS | Status: AC
  Filled 2024-10-22: qty 100

## 2024-10-22 MED ORDER — DEXAMETHASONE SOD PHOSPHATE PF 10 MG/ML IJ SOLN
INTRAMUSCULAR | Status: DC | PRN
  Administered 2024-10-22: 10 mg via INTRAVENOUS

## 2024-10-22 MED ORDER — OXYCODONE HCL 5 MG/5ML PO SOLN: 5.0000 mg | Freq: Once | ORAL | Status: AC | PRN

## 2024-10-22 MED ORDER — LIDOCAINE HCL (CARDIAC) PF 100 MG/5ML IV SOSY
PREFILLED_SYRINGE | INTRAVENOUS | Status: DC | PRN
  Administered 2024-10-22: 20 mg via INTRAVENOUS

## 2024-10-22 MED ORDER — ROCURONIUM BROMIDE 100 MG/10ML IV SOLN
INTRAVENOUS | Status: DC | PRN
  Administered 2024-10-22: 60 mg via INTRAVENOUS

## 2024-10-22 MED ORDER — OXYCODONE HCL 5 MG PO TABS
ORAL_TABLET | ORAL | Status: AC
  Filled 2024-10-22: qty 1

## 2024-10-22 MED ORDER — FENTANYL CITRATE (PF) 100 MCG/2ML IJ SOLN
INTRAMUSCULAR | Status: DC | PRN
  Administered 2024-10-22: 100 ug via INTRAVENOUS
  Administered 2024-10-22 (×2): 50 ug via INTRAVENOUS

## 2024-10-22 MED ORDER — VANCOMYCIN HCL 1000 MG IV SOLR
INTRAVENOUS | Status: DC | PRN
Start: 2024-10-22 — End: 2024-10-22
  Administered 2024-10-22: 1000 mg via TOPICAL

## 2024-10-22 MED ORDER — PHENYLEPHRINE 80 MCG/ML (10ML) SYRINGE FOR IV PUSH (FOR BLOOD PRESSURE SUPPORT)
PREFILLED_SYRINGE | INTRAVENOUS | Status: DC | PRN
  Administered 2024-10-22 (×2): 160 ug via INTRAVENOUS

## 2024-10-22 MED ORDER — FENTANYL CITRATE (PF) 100 MCG/2ML IJ SOLN
INTRAMUSCULAR | Status: AC
  Filled 2024-10-22: qty 2

## 2024-10-22 MED ORDER — HEMOSTATIC AGENTS (NO CHARGE) OPTIME
TOPICAL | Status: DC | PRN
  Administered 2024-10-22: 1 via TOPICAL

## 2024-10-22 MED ORDER — FENTANYL CITRATE (PF) 100 MCG/2ML IJ SOLN: 25.0000 ug | INTRAMUSCULAR | Status: DC | PRN

## 2024-10-22 MED ORDER — ROCURONIUM BROMIDE 10 MG/ML (PF) SYRINGE
PREFILLED_SYRINGE | INTRAVENOUS | Status: AC
  Filled 2024-10-22: qty 10

## 2024-10-22 MED ORDER — OXYCODONE HCL 5 MG PO TABS
5.0000 mg | ORAL_TABLET | Freq: Once | ORAL | Status: AC | PRN
  Administered 2024-10-22: 5 mg via ORAL

## 2024-10-22 MED ORDER — SUGAMMADEX SODIUM 200 MG/2ML IV SOLN
INTRAVENOUS | Status: DC | PRN
  Administered 2024-10-22: 200 mg via INTRAVENOUS

## 2024-10-22 MED ORDER — PROPOFOL 10 MG/ML IV BOLUS
INTRAVENOUS | Status: DC | PRN
  Administered 2024-10-22: 300 mg via INTRAVENOUS

## 2024-10-22 MED ORDER — BUPIVACAINE HCL (PF) 0.5 % IJ SOLN
INTRAMUSCULAR | Status: AC
  Filled 2024-10-22: qty 30

## 2024-10-22 MED ORDER — ACETAMINOPHEN 500 MG PO TABS: 1000.0000 mg | ORAL_TABLET | Freq: Three times a day (TID) | ORAL | 2 refills | Status: AC

## 2024-10-22 MED ORDER — OXYCODONE HCL 5 MG PO TABS: 5.0000 mg | ORAL_TABLET | ORAL | 0 refills | Status: DC | PRN

## 2024-10-22 MED ORDER — VANCOMYCIN HCL 1000 MG IV SOLR
INTRAVENOUS | Status: AC
  Filled 2024-10-22: qty 20

## 2024-10-22 MED ORDER — MIDAZOLAM HCL (PF) 2 MG/2ML IJ SOLN
2.0000 mg | Freq: Once | INTRAMUSCULAR | Status: AC
  Administered 2024-10-22: 2 mg via INTRAVENOUS

## 2024-10-22 MED ORDER — ONDANSETRON HCL 4 MG/2ML IJ SOLN
INTRAMUSCULAR | Status: AC
  Filled 2024-10-22: qty 2

## 2024-10-22 MED ORDER — PHENYLEPHRINE HCL-NACL 20-0.9 MG/250ML-% IV SOLN
INTRAVENOUS | Status: DC | PRN
  Administered 2024-10-22: 50 ug/min via INTRAVENOUS

## 2024-10-22 MED ORDER — PHENYLEPHRINE HCL-NACL 20-0.9 MG/250ML-% IV SOLN
INTRAVENOUS | Status: AC
  Filled 2024-10-22: qty 250

## 2024-10-22 MED ORDER — LIDOCAINE HCL (PF) 2 % IJ SOLN
INTRAMUSCULAR | Status: AC
  Filled 2024-10-22: qty 5

## 2024-10-22 MED ORDER — FENTANYL CITRATE (PF) 50 MCG/ML IJ SOSY: 50.0000 ug | PREFILLED_SYRINGE | Freq: Once | INTRAMUSCULAR | Status: DC

## 2024-10-22 MED ORDER — CEFAZOLIN SODIUM-DEXTROSE 2-4 GM/100ML-% IV SOLN
2.0000 g | Freq: Once | INTRAVENOUS | Status: AC
  Administered 2024-10-22: 2 g via INTRAVENOUS

## 2024-10-22 MED ORDER — 0.9 % SODIUM CHLORIDE (POUR BTL) OPTIME
TOPICAL | Status: DC | PRN
Start: 2024-10-22 — End: 2024-10-22
  Administered 2024-10-22: 500 mL

## 2024-10-22 MED ORDER — SURGIPHOR WOUND IRRIGATION SYSTEM - OPTIME
TOPICAL | Status: DC | PRN
Start: 2024-10-22 — End: 2024-10-22

## 2024-10-22 MED ORDER — BUPIVACAINE LIPOSOME 1.3 % IJ SUSP
INTRAMUSCULAR | Status: DC | PRN
  Administered 2024-10-22: 10 mL

## 2024-10-22 MED ORDER — THROMBIN 20000 UNITS EX KIT
PACK | CUTANEOUS | Status: DC | PRN
  Administered 2024-10-22: 20000 [IU] via TOPICAL

## 2024-10-22 MED ORDER — TRANEXAMIC ACID-NACL 1000-0.7 MG/100ML-% IV SOLN
1000.0000 mg | INTRAVENOUS | Status: AC
  Administered 2024-10-22: 1000 mg via INTRAVENOUS

## 2024-10-22 MED ORDER — BUPIVACAINE HCL (PF) 0.5 % IJ SOLN
INTRAMUSCULAR | Status: AC
  Filled 2024-10-22: qty 10

## 2024-10-22 MED ORDER — THROMBIN 20000 UNITS EX KIT
PACK | CUTANEOUS | Status: AC
  Filled 2024-10-22: qty 1

## 2024-10-22 MED ORDER — BUPIVACAINE HCL (PF) 0.5 % IJ SOLN
INTRAMUSCULAR | Status: DC | PRN
  Administered 2024-10-22: 10 mL

## 2024-10-22 SURGICAL SUPPLY — 63 items
ANCHOR 2.3 SP SGL 1.2 XBRAID (Anchor) IMPLANT
ANCHOR SWIVELOCK SP KL 4.75 (Anchor) IMPLANT
BLADE SAGITTAL WIDE XTHICK NO (BLADE) ×1 IMPLANT
BOWL CEMENT MIX W/ADAPTER (MISCELLANEOUS) ×1 IMPLANT
CEMENT BONE 40GM (Cement) ×1 IMPLANT
CHLORAPREP W/TINT 26 (MISCELLANEOUS) ×1 IMPLANT
CNTNR URN SCR LID CUP LEK RST (MISCELLANEOUS) IMPLANT
COOLER ICEMAN CLASSIC (MISCELLANEOUS) ×1 IMPLANT
DERMABOND ADVANCED .7 DNX12 (GAUZE/BANDAGES/DRESSINGS) ×1 IMPLANT
DRAPE INCISE IOBAN 66X45 STRL (DRAPES) ×1 IMPLANT
DRAPE SHEET LG 3/4 BI-LAMINATE (DRAPES) ×2 IMPLANT
DRAPE TABLE BACK 80X90 (DRAPES) ×1 IMPLANT
DRSG OPSITE POSTOP 4X8 (GAUZE/BANDAGES/DRESSINGS) ×1 IMPLANT
DRSG TEGADERM 2-3/8X2-3/4 SM (GAUZE/BANDAGES/DRESSINGS) ×1 IMPLANT
ELECTRODE REM PT RTRN 9FT ADLT (ELECTROSURGICAL) ×1 IMPLANT
EVACUATOR 1/8 PVC DRAIN (DRAIN) ×1 IMPLANT
GAUZE SPONGE 2X2 STRL 8-PLY (GAUZE/BANDAGES/DRESSINGS) ×1 IMPLANT
GAUZE XEROFORM 1X8 LF (GAUZE/BANDAGES/DRESSINGS) ×1 IMPLANT
GLENOID PEG SHOULDER 40MM SML (Shoulder) IMPLANT
GLOVE BIOGEL PI IND STRL 8 (GLOVE) ×2 IMPLANT
GLOVE ORTHO TXT STRL SZ7.5 (GLOVE) ×2 IMPLANT
GLOVE PI ULTRA LF STRL 7.5 (GLOVE) ×2 IMPLANT
GOWN SRG LRG LVL 4 IMPRV REINF (GOWNS) ×2 IMPLANT
GOWN SRG XL LONG LVL 3 NONREIN (GOWNS) ×1 IMPLANT
GUIDE GLENOID PATIENT (MISCELLANEOUS) IMPLANT
GUIDEWIRE GLENOID 2.5X220 (WIRE) IMPLANT
HEAD HUMERAL LOW OS 46X17 (Head) IMPLANT
HEMOSTAT SURGICEL 2X14 (HEMOSTASIS) ×1 IMPLANT
HOOD PEEL AWAY T7 (MISCELLANEOUS) ×2 IMPLANT
IV 0.9% NACL 1000 ML (IV SOLUTION) ×1 IMPLANT
KIT STABILIZATION SHOULDER (MISCELLANEOUS) ×1 IMPLANT
KIT TURNOVER KIT A (KITS) ×1 IMPLANT
MANIFOLD NEPTUNE II (INSTRUMENTS) ×1 IMPLANT
MASK FACE SPIDER DISP (MASK) ×1 IMPLANT
MAT ABSORB FLUID 56X50 GRAY (MISCELLANEOUS) ×1 IMPLANT
NDL REVERSE CUT 1/2 CRC (NEEDLE) ×1 IMPLANT
NDL SAFETY ECLIP 18X1.5 (MISCELLANEOUS) ×1 IMPLANT
NEEDLE REVERSE CUT 1/2 CRC (NEEDLE) ×1 IMPLANT
NS IRRIG 500ML POUR BTL (IV SOLUTION) ×1 IMPLANT
PACK ARTHROSCOPY SHOULDER (MISCELLANEOUS) ×1 IMPLANT
PAD ARMBOARD POSITIONER FOAM (MISCELLANEOUS) ×2 IMPLANT
PAD COLD SHLDR SM WRAP-ON (PAD) ×1 IMPLANT
PENCIL SMOKE EVACUATOR (MISCELLANEOUS) ×1 IMPLANT
PIN GUIDE 3X75 SHOULDER (PIN) IMPLANT
SLING ULTRA II LG (MISCELLANEOUS) IMPLANT
SLING ULTRA II M (MISCELLANEOUS) IMPLANT
SOLN STERILE WATER BTL 1000 ML (IV SOLUTION) ×1 IMPLANT
SOLUTION IRRIG SURGIPHOR (IV SOLUTION) IMPLANT
SPONGE T-LAP 18X18 ~~LOC~~+RFID (SPONGE) ×1 IMPLANT
STEM HUMERAL FLEX 4C (Stem) IMPLANT
STRAP SAFETY 5IN WIDE (MISCELLANEOUS) ×1 IMPLANT
SUT ETHIBOND #5 BRAIDED 30INL (SUTURE) ×1 IMPLANT
SUT ETHILON 3-0 (SUTURE) ×1 IMPLANT
SUT MNCRL AB 4-0 PS2 18 (SUTURE) ×1 IMPLANT
SUT TICRON 2-0 30IN 311381 (SUTURE) ×3 IMPLANT
SUT VIC AB 0 CT1 36 (SUTURE) ×1 IMPLANT
SUT VIC AB 2-0 CT2 27 (SUTURE) ×2 IMPLANT
SUTURE FIBERWR #2 38 BLUE 1/2 (SUTURE) ×4 IMPLANT
SWAB CULTURE AMIES ANAERIB BLU (MISCELLANEOUS) IMPLANT
SYR 20ML LL LF (SYRINGE) ×1 IMPLANT
SYRINGE TOOMEY IRRIG 70ML (MISCELLANEOUS) ×1 IMPLANT
TIP FAN IRRIG PULSAVAC PLUS (DISPOSABLE) ×1 IMPLANT
TRAP FLUID SMOKE EVACUATOR (MISCELLANEOUS) ×1 IMPLANT

## 2024-10-22 NOTE — Anesthesia Postprocedure Evaluation (Signed)
 Anesthesia Post Note  Patient: Tamara Dodson  Procedure(s) Performed: ARTHROPLASTY, SHOULDER, TOTAL (Left: Shoulder) TENODESIS, BICEPS, ROTATOR CUFF REPAIR (Left: Shoulder)  Patient location during evaluation: PACU Anesthesia Type: General Level of consciousness: awake and alert Pain management: pain level controlled Vital Signs Assessment: post-procedure vital signs reviewed and stable Respiratory status: spontaneous breathing, nonlabored ventilation, respiratory function stable and patient connected to nasal cannula oxygen Cardiovascular status: blood pressure returned to baseline and stable Postop Assessment: no apparent nausea or vomiting Anesthetic complications: no   No notable events documented.   Last Vitals:  Vitals:   10/22/24 1224 10/22/24 1248  BP: 134/73 126/72  Pulse: (!) 102   Resp: 19   Temp:    SpO2: 95% 95%    Last Pain:  Vitals:   10/22/24 1248  PainSc: 0-No pain                 [name]

## 2024-10-22 NOTE — Anesthesia Procedure Notes (Addendum)
 Anesthesia Regional Block: Interscalene brachial plexus block   Pre-Anesthetic Checklist: , timeout performed,  Correct Patient, Correct Site, Correct Laterality,  Correct Procedure, Correct Position, site marked,  Risks and benefits discussed,  Surgical consent,  Pre-op evaluation,  At surgeon's request and post-op pain management  Laterality: Left  Prep: alcohol swabs       Needles:  Injection technique: Single-shot  Needle Type: Echogenic Needle     Needle Length: 4cm  Needle Gauge: 25     Additional Needles:   Procedures:,,,, ultrasound used (permanent image in chart),,    Narrative:  Start time: 10/22/2024 7:35 AM End time: 10/22/2024 7:37 AM Injection made incrementally with aspirations every 5 mL.  Performed by: Personally  Anesthesiologist: Chesley Lendia CROME, MD  Additional Notes: Patient's chart reviewed and they were deemed appropriate candidate for procedure, at surgeon's request. Patient educated about risks, benefits, and alternatives of the block including but not limited to: temporary or permanent nerve damage, bleeding, infection, damage to surround tissues, pneumothorax, hemidiaphragmatic paralysis, unilateral Horner's syndrome, block failure, local anesthetic toxicity. Patient expressed understanding. A formal time-out was conducted consistent with institution rules.  Monitors were applied, and minimal sedation used (see nursing record). The site was prepped with skin prep and allowed to dry, and sterile gloves were used. A high frequency linear ultrasound probe with probe cover was utilized throughout. C5-7 nerve roots located and appeared anatomically normal, local anesthetic injected around them, and echogenic block needle trajectory was monitored throughout. Aspiration performed every 5ml. Lung and blood vessels were avoided. All injections were performed without resistance and free of blood and paresthesias. The patient tolerated the procedure well.  Injectate:  10ml exparel  + 10cc Exparel 

## 2024-10-22 NOTE — Discharge Instructions (Addendum)
 Tamara HILARIO Blanch, MD  [facility]  Phone: [phone]  Fax: [phone]   Discharge Instructions after Total Shoulder Replacement    1. Activity/Sling: You are to be non-weight bearing on operative extremity. A sling/shoulder immobilizer has been provided for you. Only remove the sling to perform the motion exercises (attached) and hygiene/dressing. Active reaching and lifting are not permitted. You will be given further instructions on sling use at your first physical therapy visit and postoperative visit with Dr. Blanch.   2. Dressings: Dressing may be removed at 1st physical therapy visit (~3-4 days after surgery). Afterwards, you may either leave open to air (if no drainage) or cover with dry, sterile dressing. If you have steri-strips on your wound, please do not remove them. They will fall off on their own. You may shower 5 days after surgery. Please pat incision dry. Do not rub or place any shear forces across incision. If there is drainage or any opening of incision after 5 days, please notify our offices immediately.    3. Driving:  Plan on not driving for six weeks. Please note that you are advised NOT to drive while taking narcotic pain medications as you may be impaired and unsafe to drive.   4. Medications:  - You have been provided a prescription for narcotic pain medicine (usually oxycodone ). After surgery, take 1-2 narcotic tablets every 4 hours if needed for severe pain. Please start this as soon as you begin to start having pain (if you received a nerve block, start taking as soon as this wears off).  - A prescription for anti-nausea medication will be provided in case the narcotic medicine causes nausea - take 1 tablet every 6 hours only if nauseated.  - Take enteric coated aspirin  325 mg once daily for 6 weeks to prevent blood clots. Do not take aspirin  if you have an aspirin  sensitivity/allergy  or asthma or are on an anticoagulant (blood thinner) already. If so, then your  home anticoagulant will be resume and managed - do not take aspirin . -Take tylenol  1000mg  (2 Extra strength or 3 regular strength tablets) every 8 hours for pain. This will reduce the amount of narcotic medication needed. May stop tylenol  when you are having minimal pain. - Take a stool softener (Colace, Dulcolax or Senakot) if you are using narcotic pain medications to help with constipation that is associated with narcotic use. - DO NOT take ANY nonsteroidal anti-inflammatory pain medications: Advil, Motrin, Ibuprofen, Aleve, Naproxen, or Naprosyn.   If you are taking prescription medication for anxiety, depression, insomnia, muscle spasm, chronic pain, or for attention deficit disorder you are advised that you are at a higher risk of adverse effects with use of narcotics post-op, including narcotic addiction/dependence, depressed breathing, death. If you use non-prescribed substances: alcohol, marijuana, cocaine, heroin, methamphetamines, etc., you are at a higher risk of adverse effects with use of narcotics post-op, including narcotic addiction/dependence, depressed breathing, death. You are advised that taking > 50 morphine  milligram equivalents (MME) of narcotic pain medication per day results in twice the risk of overdose or death. For your prescription provided: oxycodone  5 mg - taking more than 6 tablets per day after the first few days of surgery.   5. Physical Therapy: 1-2 times per week for ~12 weeks. Therapy typically starts on post operative Day 3 or 4. You have been provided an order for physical therapy. The therapist will provide home exercises. Please contact our offices if this appointment has not been scheduled.  6. Work: May do light duty/desk job in approximately 2 weeks when off of narcotics, pain is well-controlled, and swelling has decreased if able to function with one arm in sling. Full work may take 6 weeks if light motions and function of both arms is required. Lifting  jobs may require 12 weeks.   7. Post-Op Appointments: Your first post-op appointment will be with Dr. Tobie in approximately 2 weeks time.    If you find that they have not been scheduled please call the Orthopaedic Appointment front desk at [phone].                       Home Exercises ? Perform passive, assisted forward flexion and external rotation (outward turning) exercises with the operative arm. You were taught these exercises prior to discharge. Both exercises should be done with the non-operative arm used as the therapist arm while the operative arm remains completely relaxed.  ? 10 of each exercise should be done 5 times daily, work up to the max degrees  Forward Flexion Maximum: 140 deg. Lie flat on your back, completely relax your operative arm like a wet noodle, and grasp the wrist of the operative shoulder with your opposite hand. Using the power in your opposite arm, bring the stiff arm up only to the maximum indicated above (90 degrees indicates your arm pointed straight ahead). Start holding it for ten seconds and then work up to where you can hold it for a count of 30. Breathe slowly and deeply while the arm is moved. Repeat this stretch ten times.      External rotation Maximum: 40 deg. External rotation is turning the arm out to the side while your elbow stays close to your body. It is best stretched while you are lying on your back. Hold a cane, yardstick, broom handle, or golf club in both hands. Bend both elbows to a right angle. With your operative arm completely relaxed, use steady, gentle force from your normal arm to rotate the hand of the stiff shoulder out away from your body. Continue the rotation only to the maximum indicated above (90 degrees indicates your arm pointed straight ahead). Holding it there for a count of 10. Repeat this exercise ten times.   Tamara HILARIO Tobie, MD  [facility]  Phone: [phone]  Fax:  [phone]   Total Shoulder Arthroplasty Rehabilitation Framework  The following is a basic framework from which to work during rehabilitation following a Shoulder Arthroplasty. However, it is critical to communicate with the surgeon in order to be aware of the condition of the tissue at the time of repair, any concomitant procedures that might have been performed, etc, that might impact the progression that is appropriate for each specific patient.  1-2x/week for 10-12 weeks.   Subscapularis Safe Zones established intraoperatively by the surgeon ? These ranges can start on Post-op day 1, but may require a few weeks to achieve depending on patient comfort ? Supine, passive, well-arm assisted: ? 140/40 Program: Max. forward flexion to 140 degrees ; Max. External rotation to 40 degrees ? No abduction  ----------------------------------------------------------------------------------------------------------------------  Phase I: Passive Motion - 0-6 weeks post-op  Goals: ? PROM - 140 degrees of flexion, ER of 40 by the end of week 6 (see above) ? Decrease pain, Decrease muscle atrophy, Educate regarding joint protection ? Provide the patient with instructions for home exercises (last pages) 5 x per day  Precautions: ? Stay within safe zone determined at  surgery (see above) ? Week 1-2: Sling with abduction pillow at all times, removed only for 5x/day exercises, showering, and dressing ? Week 3-6: Sling while out of home/uncontrolled environment, continue wearing during sleep if patient is an active sleeper. ? Week 3-6: Ok to perform waist level activities WITH ELBOW AT SIDE in front of the body o Typing, eating utensils, combing hair and washing face with elbow at side o No lifting, reaching or pulling heavier than coffee cup with elbow at side  Teaching: ? Emphasize home, supine, passive well-arm assisted PROM (FF and ER as above) ? Instruct in regular icing techniques or cold  therapy device (use as much as possible out of 24 hours for 8-10 days) ? Ice packs for 20 - 30 minutes intervals, especially at the end of an exercise session ? Monitor for edema in forearm, hand, or finger  Exercises: ? Pendulum exercises o With the arm hanging, the patient gently swings the hand forward and backward, then side-to-side, and then clockwise and counterclockwise ? Passive, supine well-arm assisted forward flexion, in front of the plane of the scapula as pain allows per safe zone above (140/40) ? Active scapular retraction, elevation in sitting or standing ? Active elbow, wrist, hand ROM - Grasping and gripping lightweight objects  ----------------------------------------------------------------------------------------------------------------------  Phase II: Active Range of Motion (6-10 weeks post-op)  Goals: ? Full range of motion by end of week 10. After 6 week physician visit, patient and therapist can move beyond the safe zones as pain allows. Please notify physician if range of motion is not improving by week 10.  ? Emphasis should be on range of motion before strengthening. ? Improve strength, Decrease pain, Increase functional activities, Scapular stabilization Precautions: ? No sling use ? No resisted internal rotation until 10 weeks post-op Teaching: ? Encourage continued stretching at home. Limited only by pain ? Ice after exercise. Exercises: ? Encourage patient to use smooth, natural movement patterns ? Continue to work on Passive ROM as in Phase I ? Begin AROM and AAROM (using a cane), progressively, to full range of motion ? Assisted forward flexion supine using uninvolved arm to assist - progressing to active motion in a reclined position and then to sitting ? Side lying ER against gravity ? Encourage normal scapular mechanics with active motion ? Add Theraband exercises or light dumbbell weights (2lbs) for flexion, extension, external rotation ?  Scapulothoracic strengthening (prone extension, prone T, etc.) ? Aquatic therapy, if available, can begin no earlier than 1 month post op if wound is completely healed.  o Week 1-6: Stay within established safe zone listed above. Passive motion only o Week 6 +: Shoulder fully submerged - slow, active motions for flexion, elevation, ER/IR and horizontal abduction/adduction out to scapular plane, range of motion limited by pain Only.  ----------------------------------------------------------------------------------------------------------------------  Phase III: Final Strengthening - 10+ weeks  Goals: ? If acceptable motion has been achieved (>150 FF, >50 ER, IR T12 or above), then Maximize strength--otherwise continue with stretching program ? Improve neuromuscular control ? Increase functional activities Precautions: ? No sudden, forceful resisted IR (e.g. golfing, wood splitting, swimming) until >3 months post-op Teaching: ? Continue home stretching minimum 1x per day to maintain full range of motion Exercises: ? Continue to increase difficulty of theraband and dumbbell exercises as tolerated ? Increase resistance exercises - must be light enough weight that >20 reps are achieved per set ? Continue aerobic training as tolerated, and modalities as appropriate ? Continue to progress home program  NOTES: 1. With proper exercise, motion, strength, and function continue to improve even after one year. 2. The complication rate after surgery is 5 - 8%. Complications include infection, fracture, heterotopic bone formation, nerve injury, instability, rotator cuff tear, and tuberosity nonunion. Please look for clinical signs, unusual symptoms, or lack of progress with therapy and report those to Dr. Tobie. Prefer more communication than less.  3. The therapy plan above only serves as a guide. Please be aware of specific individualized patient instructions as written on the prescription or  through discussions with the surgeon. 4. Please call Dr. Tobie if you have any specific questions or concerns [phone]. 5. The patient's "Home exercise stretching program" (critical for first 10 weeks) is attached.     Interscalene Nerve Block with Exparel    For your surgery you have received an Interscalene Nerve Block with Exparel . Nerve Blocks affect many types of nerves, including nerves that control movement, pain and normal sensation.  You may experience feelings such as numbness, tingling, heaviness, weakness or the inability to move your arm or the feeling or sensation that your arm has fallen asleep. A nerve block with Exparel  can last up to 5 days.  Usually the weakness wears off first.  The tingling and heaviness usually wear off next.  Finally you may start to notice pain.  Keep in mind that this may occur in any order.  Once a nerve block starts to wear off it is usually completely gone within 60 minutes. ISNB may cause mild shortness of breath, a hoarse voice, blurry vision, unequal pupils, or drooping of the face on the same side as the nerve block.  These symptoms will usually resolve with the numbness.  Very rarely the procedure itself can cause mild seizures. If needed, your surgeon will give you a prescription for pain medication.  It will take about 60 minutes for the oral pain medication to become fully effective.  So, it is recommended that you start taking this medication before the nerve block first begins to wear off, or when you first begin to feel discomfort. Take your pain medication only as prescribed.  Pain medication can cause sedation and decrease your breathing if you take more than you need for the level of pain that you have. Nausea is a common side effect of many pain medications.  You may want to eat something before taking your pain medicine to prevent nausea. After an Interscalene nerve block, you cannot feel pain, pressure or extremes in temperature in the  effected arm.  Because your arm is numb it is at an increased risk for injury.  To decrease the possibility of injury, please practice the following:  While you are awake change the position of your arm frequently to prevent too much pressure on any one area for prolonged periods of time.  If you have a cast or tight dressing, check the color or your fingers every couple of hours.  Call your surgeon with the appearance of any discoloration (white or blue). If you are given a sling to wear before you go home, please wear it  at all times until the block has completely worn off.  Do not get up at night without your sling. Please contact [facility] or your surgeon if you do not begin to regain sensation after 7 days from the surgery.  Anesthesia may be contacted by calling the Same Day Surgery Department, Mon. through Fri., 6 am to 4 pm at [phone].   If you  experience any other problems or concerns, please contact your surgeon's office. If you experience severe or prolonged shortness of breath go to the nearest emergency department.SHOULDER SLING IMMOBILIZER   VIDEO Slingshot 2 Shoulder Brace Application - YouTube ---[email or web address]  INSTRUCTIONS While supporting the injured arm, slide the forearm into the sling. Wrap the adjustable shoulder strap around the neck and shoulders and attach the strap end to the sling using  the "alligator strap tab."  Adjust the shoulder strap to the required length. Position the shoulder pad behind the neck. To secure the shoulder pad location (optional), pull the shoulder strap away from the shoulder pad, unfold the hook material on the top of the pad, then press the shoulder strap back onto the hook material to secure the pad in place. Attach the closure strap across the open top of the sling. Position the strap so that it holds the arm securely in the sling. Next, attach the thumb strap to the open end of the sling between the  thumb and fingers. After sling has been fit, it may be easily removed and reapplied using the quick release buckle on shoulder strap. If a neutral pillow or 15 abduction pillow is included, place the pillow at the waistline. Attach the sling to the pillow, lining up hook material on the pillow with the loop on sling. Adjust the waist strap to fit.  If waist strap is too long, cut it to fit. Use the small piece of double sided hook material (located on top of the pillow) to secure the strap end. Place the double sided hook material on the inside of the cut strap end and secure it to the waist strap.     If no pillow is included, attach the waist strap to the sling and adjust to fit.    Washing Instructions: Straps and sling must be removed and cleaned regularly depending on your activity level and perspiration. Hand wash straps and sling in cold water with mild detergent, rinse, air dry   SHOULDER SLING IMMOBILIZER   VIDEO Slingshot 2 Shoulder Brace Application - YouTube ---[email or web address]  INSTRUCTIONS While supporting the injured arm, slide the forearm into the sling. Wrap the adjustable shoulder strap around the neck and shoulders and attach the strap end to the sling using  the "alligator strap tab."  Adjust the shoulder strap to the required length. Position the shoulder pad behind the neck. To secure the shoulder pad location (optional), pull the shoulder strap away from the shoulder pad, unfold the hook material on the top of the pad, then press the shoulder strap back onto the hook material to secure the pad in place. Attach the closure strap across the open top of the sling. Position the strap so that it holds the arm securely in the sling. Next, attach the thumb strap to the open end of the sling between the thumb and fingers. After sling has been fit, it may be easily removed and reapplied using the quick release buckle on shoulder strap. If a neutral  pillow or 15 abduction pillow is included, place the pillow at the waistline. Attach the sling to the pillow, lining up hook material on the pillow with the loop on sling. Adjust the waist strap to fit.  If waist strap is too long, cut it to fit. Use the small piece of double sided hook material (located on top of the pillow) to secure the strap end. Place the double sided hook material on the inside of the cut strap  end and secure it to the waist strap.     If no pillow is included, attach the waist strap to the sling and adjust to fit.    Washing Instructions: Straps and sling must be removed and cleaned regularly depending on your activity level and perspiration. Hand wash straps and sling in cold water with mild detergent, rinse, air dry

## 2024-10-22 NOTE — Anesthesia Procedure Notes (Signed)
 Procedure Name: Intubation Date/Time: 10/22/2024 7:53 AM  Performed by: Niki Manus SAUNDERS, CRNAPre-anesthesia Checklist: Patient identified, Patient being monitored, Timeout performed, Emergency Drugs available and Suction available Patient Re-evaluated:Patient Re-evaluated prior to induction Oxygen Delivery Method: Circle system utilized Preoxygenation: Pre-oxygenation with 100% oxygen Induction Type: IV induction Ventilation: Mask ventilation without difficulty Laryngoscope Size: McGrath and 4 Grade View: Grade I Tube type: Oral Tube size: 7.0 mm Number of attempts: 1 Airway Equipment and Method: Stylet Placement Confirmation: ETT inserted through vocal cords under direct vision, positive ETCO2 and breath sounds checked- equal and bilateral Secured at: 21 cm Tube secured with: Tape Dental Injury: Teeth and Oropharynx as per pre-operative assessment

## 2024-10-22 NOTE — H&P (Signed)
 Paper H&P to be scanned into permanent record. H&P reviewed. No significant changes noted.

## 2024-10-22 NOTE — Anesthesia Preprocedure Evaluation (Addendum)
 Anesthesia Evaluation  Patient identified by MRN, date of birth, ID band Patient awake    Reviewed: Allergy  & Precautions, NPO status , Patient's Chart, lab work & pertinent test results  History of Anesthesia Complications (+) DIFFICULT AIRWAY and history of anesthetic complications  Airway Mallampati: III  TM Distance: >3 FB Neck ROM: full    Dental  (+) Chipped   Pulmonary neg pulmonary ROS   Pulmonary exam normal        Cardiovascular hypertension, On Medications Normal cardiovascular exam     Neuro/Psych  Headaches  Neuromuscular disease  negative psych ROS   GI/Hepatic ,GERD  ,,(+) Cirrhosis         Endo/Other    Class 3 obesity  Renal/GU      Musculoskeletal   Abdominal   Peds  Hematology  (+) Blood dyscrasia, anemia   Anesthesia Other Findings Past Medical History: No date: Bilateral carpal tunnel syndrome No date: Celiac disease/sprue No date: Cirrhosis (HCC) 2024: Difficult airway for intubation     Comment:  a.) limited mouth opening and decreased cervical ROM No date: DJD (degenerative joint disease) No date: Erythrocytosis No date: Essential hypertension No date: Fatty liver No date: Frozen shoulder syndrome (LEFT) No date: GERD (gastroesophageal reflux disease) No date: Gluten intolerance No 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 No date: IDA (iron deficiency anemia) No date: IgA deficiency (HCC) 07/18/2019: Iron overload No date: 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 No date: Lumbago No date: Lymphedema of lower extremity No date: Migraines No date: Morbid obesity (HCC) No date: Neuropathy of both feet No date: Nonalcoholic fatty liver disease No date: Osteoarthritis No date: Portal venous hypertension (HCC) No date: Radiculopathy of cervical region No date: Vertigo  Past Surgical History: 02/24/2021: ANTERIOR CERVICAL DECOMP/DISCECTOMY FUSION; N/A     Comment:  Procedure: ANTERIOR CERVICAL  DECOMPRESSION/DISCECTOMY               FUSION 2 LEVELS C3-5;  Surgeon: Clois Fret, MD;                Location: [facility];  Service: Neurosurgery;  Laterality:               N/A; 01/26/2022: ANTERIOR CERVICAL DECOMP/DISCECTOMY FUSION; N/A     Comment:  Procedure: C5-6 ANTERIOR CERVICAL DISCECTOMY & FUSION               (GLOBUS HEDRON);  Surgeon: Clois Fret, MD;                Location: [facility];  Service: Neurosurgery;  Laterality:               N/A; 09/04/2023: ANTERIOR CERVICAL DECOMP/DISCECTOMY FUSION; N/A     Comment:  Procedure: C6-7 ANTERIOR CERVICAL DISCECTOMY AND FUSION               (HEDRON);  Surgeon: Clois Fret, MD;  Location:               [facility];  Service: Neurosurgery;  Laterality: N/A; No date: APPENDECTOMY 01/23/2024: CARPAL TUNNEL RELEASE; Right     Comment:  Procedure: RIGHT CARPAL TUNNEL RELEASE WITH ULTRASOUND               GUIDANCE, POSSIBLE CONVERT TO OPEN;  Surgeon: Claudene Penne ORN, MD;  Location: [facility];  Service:               Neurosurgery;  Laterality: Right; 03/05/2024:  CARPAL TUNNEL RELEASE; Left     Comment:  Procedure: LEFT CARPAL TUNNEL RELEASE WITH ULTRASOUND               GUIDANCE, POSSIBLE CONVERT TO OPEN;  Surgeon: Claudene Penne ORN, MD;  Location: [facility];  Service:               Neurosurgery;  Laterality: Left; 1983: CHOLECYSTECTOMY 08/22/2013: COLONOSCOPY 03/22/2023: COLONOSCOPY; N/A     Comment:  Procedure: COLONOSCOPY;  Surgeon: Toledo, Ladell POUR, MD;              Location: [facility];  Service: Gastroenterology;                Laterality: N/A; 01/25/2021: ESOPHAGOGASTRODUODENOSCOPY; N/A     Comment:  Procedure: ESOPHAGOGASTRODUODENOSCOPY (EGD);  Surgeon:               Toledo, Ladell POUR, MD;  Location: [facility];                Service: Gastroenterology;  Laterality: N/A;  C-19 test               on 01/22/2021 Requesting to be the last case due to her                transportation 03/22/2023: ESOPHAGOGASTRODUODENOSCOPY; N/A     Comment:  Procedure: ESOPHAGOGASTRODUODENOSCOPY (EGD);  Surgeon:               Toledo, Ladell POUR, MD;  Location: [facility];                Service: Gastroenterology;  Laterality: N/A; 03/12/2024: EXAM UNDER ANESTHESIA WITH MANIPULATION OF SHOULDER; Left     Comment:  Procedure: EXAM UNDER ANESTHESIA, SHOULDER, WITH               MANIPULATION;  Surgeon: Tobie Priest, MD;  Location: [facility]              [facility];  Service: Orthopedics;  Laterality: Left; 09/21/2015: KNEE ARTHROPLASTY; Right     Comment:  Procedure: COMPUTER ASSISTED TOTAL KNEE ARTHROPLASTY;                Surgeon: Lynwood SHAUNNA Hue, MD;  Location: [facility];                Service: Orthopedics;  Laterality: Right; 09/14/2011: KNEE ARTHROSCOPY; Right 03/12/2024: POSTERIOR LUMBAR FUSION 2 WITH HARDWARE REMOVAL; Left     Comment:  Procedure: ARTHROSCOPY, SHOULDER WITH DEBRIDEMENT;                Surgeon: Tobie Priest, MD;  Location: [facility];  Service:              Orthopedics;  Laterality: Left;  Right shoulder               arthroscopic lysis of adhesions, capsular release, and               manipulation under anesthesia with corticosteroid               injection 04/11/2022: SHOULDER ARTHROSCOPY; Left     Comment:  Procedure: Left shoulder arthroscopic regeneten patch,               biceps tenodesis, subacromial decompression;  Surgeon:               Tobie Priest, MD;  Location: [facility];  Service:  Orthopedics;  Laterality: Left; 08/11/2022: SHOULDER ARTHROSCOPY WITH OPEN ROTATOR CUFF REPAIR; Left     Comment:  Procedure: Left revision shoulder arthroscopic rotator               cuff repair and extensive glenohumeral debridement;                Surgeon: Tobie Priest, MD;  Location: [facility];  Service: Orthopedics;  Laterality: Left;  needs               potassium draw No date: TONSILLECTOMY 02/14/2011: TOTAL HIP ARTHROPLASTY;  Left 11/17/2017: TOTAL HIP ARTHROPLASTY; Right     Comment:  Procedure: TOTAL HIP ARTHROPLASTY;  Surgeon: Mardee Lynwood SQUIBB, MD;  Location: [facility];  Service: Orthopedics;               Laterality: Right; 02/18/2013: TOTAL KNEE ARTHROPLASTY; Left No date: WISDOM TOOTH EXTRACTION  BMI    Body Mass Index: 43.93 kg/m      Reproductive/Obstetrics negative OB ROS                              Anesthesia Physical Anesthesia Plan  ASA: 3  Anesthesia Plan: General ETT   Post-op Pain Management: Regional block*, Ofirmev  IV (intra-op)* and Toradol  IV (intra-op)*   Induction: Intravenous  PONV Risk Score and Plan: 3 and Ondansetron , Dexamethasone , Midazolam  and Treatment may vary due to age or medical condition  Airway Management Planned: Oral ETT  Additional Equipment:   Intra-op Plan:   Post-operative Plan: Extubation in OR  Informed Consent: I have reviewed the patients History and Physical, chart, labs and discussed the procedure including the risks, benefits and alternatives for the proposed anesthesia with the patient or authorized representative who has indicated his/her understanding and acceptance.     Dental Advisory Given  Plan Discussed with: Anesthesiologist, CRNA and Surgeon  Anesthesia Plan Comments: (Patient consented for risks of anesthesia including but not limited to:  - adverse reactions to medications - damage to eyes, teeth, lips or other oral mucosa - nerve damage due to positioning  - sore throat or hoarseness - Damage to heart, brain, nerves, lungs, other parts of body or loss of life  Patient voiced understanding and assent.)         Anesthesia Quick Evaluation

## 2024-10-22 NOTE — Transfer of Care (Signed)
 Immediate Anesthesia Transfer of Care Note  Patient: Tamara Dodson  Procedure(s) Performed: ARTHROPLASTY, SHOULDER, TOTAL (Left: Shoulder) TENODESIS, BICEPS, ROTATOR CUFF REPAIR (Left: Shoulder)  Patient Location: PACU  Anesthesia Type:General  Level of Consciousness: drowsy  Airway & Oxygen Therapy: Patient Spontanous Breathing and Patient connected to nasal cannula oxygen  Post-op Assessment: Report given to RN and Post -op Vital signs reviewed and stable  Post vital signs: Reviewed and stable  Last Vitals:  Vitals Value Taken Time  BP 129/69 10/22/24 11:02  Temp 36.6 C 10/22/24 11:02  Pulse 97 10/22/24 11:07  Resp 13 10/22/24 11:07  SpO2 98 % 10/22/24 11:07  Vitals shown include unfiled device data.  Last Pain:  Vitals:   10/22/24 0628  PainSc: 10-Worst pain ever         Complications: No notable events documented.

## 2024-10-22 NOTE — Progress Notes (Signed)
 PT Cancellation Note  Patient Details Name: KATHLEAN CINCO MRN: [id number] DOB: [birth year]   Cancelled Treatment:    Reason Eval/Treat Not Completed: PT screened, no needs identified, will sign off. Chart reviewed. Pt screened. Per OT, no PT needs identified. Please re-consult if needs change.  Natalye Kott, SPT  Duwayne Matters 10/22/2024, 1:56 PM

## 2024-10-22 NOTE — Evaluation (Signed)
 Occupational Therapy Evaluation Patient Details Name: Tamara Dodson MRN: [id number] DOB: 1961-03-16 Today's Date: 10/22/2024   History of Present Illness   63 year old female s/p R reverse TSA. PMH of HTN, anemia, GERD, cirrhosis.     Clinical Impressions Pt was seen for an OT evaluation this date. Pt lives with her husband and other family members in a 2 level home with chair lift access to 2nd floor. Prior to surgery, pt was independent with ADLs and had been to therapy for issues with her L shoulder. Pt has orders for LUE to be immobilized and will be NWBing per MD. She presents with impaired strength/ROM, pain, and sensation to LUE with block not completely resolved yet. These impairments result in a decreased ability to perform self care tasks requiring max assist for UB dressing and max assist for application of polar care, compression stockings, and sling/immobilizer. She performed STS and ambulation with SBA during session reporting she is moving at her baseline. Pt and granddaughter instructed in polar care mgt, compression stockings mgt, sling/immobilizer mgt, ROM exercises for LUE (with instructions for no shoulder exercises until full sensation has returned), LUE precautions, adaptive strategies for ADLs, positioning and considerations for sleep, and home/routines modifications to maximize falls prevention, safety, and independence. Handout provided. OT adjusted sling/immobilizer and polar care to improve comfort, optimize positioning, and to maximize skin integrity/safety. Pt verbalized understanding of all education/training provided. Recommend continued OT services on DC to maximize return to PLOF, address home/routines modifications and safety, minimize falls risk, and minimize caregiver burden.        If plan is discharge home, recommend the following:   A little help with walking and/or transfers;A lot of help with bathing/dressing/bathroom;Assistance with cooking/housework      Functional Status Assessment   Patient has not had a recent decline in their functional status     Equipment Recommendations   None recommended by OT     Recommendations for Other Services         Precautions/Restrictions   Precautions Precautions: Shoulder Shoulder Interventions: Shoulder sling/immobilizer;At all times;Off for dressing/bathing/exercises Precaution Booklet Issued: Yes (comment) Recall of Precautions/Restrictions: Intact Restrictions Weight Bearing Restrictions Per Provider Order: Yes LUE Weight Bearing Per Provider Order: Non weight bearing     Mobility Bed Mobility               General bed mobility comments: NT in recliner pre/post session    Transfers Overall transfer level: Needs assistance Equipment used: None Transfers: Sit to/from Stand Sit to Stand: Supervision                  Balance Overall balance assessment: Mild deficits observed, not formally tested                                         ADL either performed or assessed with clinical judgement   ADL Overall ADL's : Needs assistance/impaired                 Upper Body Dressing : Maximal assistance;Sitting Upper Body Dressing Details (indicate cue type and reason): donn gown overhead                   General ADL Comments: pt stood from recliner with CGA/SBA x2 trials during session, ambulated with SBA d/t mild sway, reports issues with vertigo at baseline; granddaughter reports pt is  a slow ambulator at baseline     Vision         Perception         Praxis         Pertinent Vitals/Pain Pain Assessment Pain Assessment: Faces Faces Pain Scale: Hurts a little bit Pain Intervention(s): Monitored during session, Repositioned     Extremity/Trunk Assessment Upper Extremity Assessment Upper Extremity Assessment: Right hand dominant;LUE deficits/detail LUE Deficits / Details: s/p RSA   Lower Extremity  Assessment Lower Extremity Assessment: Overall WFL for tasks assessed       Communication Communication Communication: No apparent difficulties   Cognition Arousal: Alert Behavior During Therapy: WFL for tasks assessed/performed Cognition: No apparent impairments                               Following commands: Intact       Cueing  General Comments      mild sway in standing/gait, vertigo at baseline   Exercises Other Exercises Other Exercises: Edu on role of OT in acute setting, DC recommendations, polar care management, compression stockings wear/management, donning/doffing sling/immobilizer, and ADL management. Pt and granddaughter verbalized understanding.   Shoulder Instructions      Home Living Family/patient expects to be discharged to:: Private residence Living Arrangements: Children;Spouse/significant other Available Help at Discharge: Family;Available PRN/intermittently;Available 24 hours/day Type of Home: House Home Access: Stairs to enter [employer] of Steps: 1 Entrance Stairs-Rails: None Home Layout: Two level;Bed/bath upstairs Alternate Level Stairs-Number of Steps: has a Sports Coach Shower/Tub: Producer, Television/film/video: Handicapped height Bathroom Accessibility: Yes              Prior Functioning/Environment Prior Level of Function : Independent/Modified Independent                    OT Problem List: Pain;Decreased strength;Decreased range of motion   OT Treatment/Interventions:        OT Goals(Current goals can be found in the care plan section)       OT Frequency:       Co-evaluation              AM-PAC OT 6 Clicks Daily Activity     Outcome Measure Help from another person eating meals?: A Little Help from another person taking care of personal grooming?: A Little Help from another person toileting, which includes using toliet, bedpan, or urinal?: A Lot Help from  another person bathing (including washing, rinsing, drying)?: A Lot Help from another person to put on and taking off regular upper body clothing?: A Lot Help from another person to put on and taking off regular lower body clothing?: A Lot 6 Click Score: 14   End of Session Equipment Utilized During Treatment:  (sling/immobilizer) Nurse Communication: Mobility status  Activity Tolerance: Patient tolerated treatment well Patient left: in chair;with family/visitor present                   Time: 8678-8640 OT Time Calculation (min): 38 min Charges:  OT General Charges $OT Visit: 1 Visit OT Evaluation $OT Eval Moderate Complexity: 1 Mod OT Treatments $Self Care/Home Management : 23-37 mins  Olanrewaju Osborn, OTR/L  10/22/24, 2:53 PM   Tamara Dodson 10/22/2024, 2:50 PM

## 2024-10-22 NOTE — Progress Notes (Signed)
 Hemovac drain removed with no complications.  Discharge home when completes PO goals.

## 2024-10-22 NOTE — Op Note (Signed)
 Operative Note    SURGERY DATE: 10/22/2024   PRE-OP DIAGNOSIS:  1. Left shoulder osteoarthritis   POST-OP DIAGNOSIS:  1. Left shoulder osteoarthritis 2. Left biceps tendinopathy 3. Left partial-thickness supraspinatus tear   PROCEDURES:  1. Left total shoulder arthroplasty 2. Left biceps tenodesis 3. Left open rotator cuff repair   SURGEON: Earnestine HILARIO Blanch, MD  ASSISTANTS: Krystal Doyne, PA   ANESTHESIA: Gen + Exparel  interscalene block   ESTIMATED BLOOD LOSS: 150cc   TOTAL IV FLUIDS: per anesthesia  IMPLANTS: Tornier Aequalis:  - Perform Cortiloc Pegged Glenoid S40  - Ascend Flex Humeral Stem - Size 4C - Humeral Head 46 x 17mm Low Ecc head   INDICATION(S):  Tamara Dodson is a 63 y.o. female history of 3 prior shoulder surgeries including rotator cuff repairs and capsular release for adhesive capsulitis.  Patient did do well after each of these procedures, but has had recurrent stiffness.  MRI shows significant progressive degenerative changes to the glenohumeral joint, and patient has significant pain at the end ranges of motion with significant associated stiffness.  She is undergone extensive nonoperative management consisting of corticosteroid injections and physical therapy without improvement in her symptoms.  She also underwent preoperative infectious workup consisting of ESR, CRP, and joint aspiration, all of which were negative for infection.  After discussion of risks, benefits, and alternatives to surgery, the patient elected to proceed.    OPERATIVE FINDINGS: glenohumeral arthritis, biceps tendinopathy, partial-thickness tearing of the anterior supraspinatus on the articular side     OPERATIVE REPORT:   I identified Charlies LITTIE Hench  in the pre-operative holding area. Informed consent was obtained and the surgical site was marked. I reviewed the risks and benefits of the proposed surgical intervention and the patient wished to proceed. An interscalene block with Exparel  was  administered by the Anesthesia team. The patient was transferred to the operative suite and general anesthesia was administered. The patient was placed in the beach chair position with the head of the bed elevated approximately 45 degrees. All down side pressure points were appropriately padded. Pre-op exam under anesthesia confirmed some stiffness and crepitus. Appropriate IV antibiotics were administered within 30 minutes before incision. Tranexamic acid  was also administered after verifying that the patient had no contraindications. The extremity was then prepped and draped in standard fashion. A time out was performed confirming the correct extremity, correct patient, and correct procedure.   We used the standard deltopectoral incision from the coracoid to ~10cm distal. We found the cephalic vein and took it laterally, and it was protected throughout the case. We opened the deltopectoral interval widely and placed retractors under the CA ligament in the subacromial space and under the deltoid tendon at its insertion. We then abducted and internally rotated the arm and released the underlying bursa between these retractors, taking care not to damage the circumflex branch of the axillary nerve.   Next, we brought the arm back in adduction at slight forward flexion with external rotation. We opened the clavipectoral fascia lateral to the conjoint tendon. We gently palpated the axillary nerve and verified its position and continuity on both sides of the humerus with a Tug test. Note, this test was repeated multiple times during the procedure for nerve localization and confirmed to be intact at the end of the case. We then cauterized the anterior humeral circumflex ("Three sisters") vessels. The arm was then internally rotated, we cut the falciform ligament at approximately 1 cm of the upper portion of the  pectoralis major insertion. Next we unroofed the bicipital groove. The biceps tendon was tendinopathic with  inflammatory synovium surrounding. We proceeded with a soft tissue biceps tenodesis given the pathology of the tendon.  After opening the biceps tendon sheath, we performed a biceps tenodesis with two #2 TiCron sutures to the upper boarder of the pectoralis major. The proximal portion of the tendon was excised.   At this point we visualized the rotator cuff, and it was grossly intact except for a region of partial-thickness articular sided tearing of the anterior supraspinatus.  Given the nature of this tear, we decided to proceed with anatomic shoulder arthroplasty with repair of the rotator cuff after implant placement.  We performed a lesser tuberosity osteotomy. Next, we dissected the subscapularis off of the underlying capsule and then released the inferior capsule from the humerus all the way to the posterior band of the inferior glenohumeral ligament. When this was complete we gently dislocated the shoulder up into the wound. We removed the osteophytes and made our cut with the appropriate inclination in 20 degrees of retroversion.  The size 1 nucleus for stemless anatomic shoulder arthroplasty was placed in the humeral head, and a protector plate was placed.  We then turned our attention back to the glenoid. A posterior retractor was used to retract the proximal humerus posteriorly. The remnant of the anterior capsule was excised, exposing the anterior glenoid. We then removed the labrum circumferentially. During the glenoid exposure, the axillary nerve was protected the entire time. Capsular release was extended from the glenoid to past the posterior/inferior quadrant.    The central guidepin was placed with slight correction of the superior tilt and retroversion to neutral, in accordance with the preoperative plan.  Patient-specific guide had been ordered, but appropriate seating could not be achieved on the glenoid.  An appropriately sized reamer was used to ream the glenoid.  Reaming was consistent  with preoperative plan.  Peripheral and central holes were drilled. The trial glenoid seated well. Bony endpoint of peripheral holes was verified. We used thrombin  soaked surgicell for hemostasis in the peripheral holes. Pressurized cement was used to fill the peripheral holes. The glenoid component was placed and it was well seated without any motion afterwards.   On exposure of the humerus, there was some impaction of the lesser tuberosity, given her concern for stability of the stemless implant with this, decision was made to use a short stem implant.  Appropriate broaching was performed with 20 degrees of retroversion.  Next, the humerus was trialed with the above components and noted to have satisfactory stability, motion, and subscapularis tension. The wound and humeral canal were thoroughly irrigated with pulse lavage. Three drill holes for subscapularis repair were place just lateral to the bicipital groove and FiberWire sutures were passed through for later repair. The final humeral component was then inserted with another FiberWire suture placed around the implant. Excellent stability was achieved after final impaction of the implant.    Next, rotator cuff repair was performed.  An Arthrex 4.75 mm SwiveLock double loaded with tape was impacted into a prior suture hole.  The rotator cuff was held in a reduced position.  Using a free needle, both sets of sutures were passed through the rotator cuff in a mattress fashion and tied, the securing the rotator cuff down to the anchor.  Next, the lesser tuberosity osteotomy was then repaired by passing the 3 sutures of 5 or around the bicipital groove at the bone-tendon junction of  the lesser tuberosity osteotomy. The suture around the implant was passed just medial to these sutures in a mattress fashion. Excellent fixation of the lesser tuberosity osteotomy was achieved.  A suture tape was then used to repair the lateral part of the rotator interval.  We  again verified the tension on the axillary nerve, appropriate range of motion, stability of the implant, and security of the subscapularis repair. We closed the deltopectoral interval deep to the cephalic vein with a running, 0-Vicryl suture. The skin was closed with 2-0 Vicryl and 4-0 Monocryl and Dermabond. Xeroform and Honeycomb dressing was applied. Patient was extubated, transferred to the post anesthesia care unit in stable condition.   Of note, assistance from a PA was essential to performing the surgery.  PA was present for the entire surgery.  PA assisted with patient positioning, retraction, instrumentation, and wound closure. The surgery would have been more difficult and had longer operative time without PA assistance.   POSTOPERATIVE PLAN: The patient will be discharged home from the PACU. Operative arm to remain in sling at all times except RoM exercises and hygiene. Can perform pendulums, elbow/wrist/hand RoM exercises. Passive RoM allowed to 140 FF and 40 ER. ASA 325mg  x 6 weeks for DVT ppx. Plan for outpatient PT starting on POD #3-4. Patient to return to clinic in ~2 weeks for post-operative appointment.

## 2024-10-23 ENCOUNTER — Encounter: Payer: Self-pay | Admitting: Orthopedic Surgery

## 2024-10-27 LAB — AEROBIC/ANAEROBIC CULTURE W GRAM STAIN (SURGICAL/DEEP WOUND): Gram Stain: NONE SEEN

## 2024-10-29 ENCOUNTER — Other Ambulatory Visit: Payer: Self-pay | Admitting: Orthopedic Surgery

## 2024-10-29 DIAGNOSIS — M19012 Primary osteoarthritis, left shoulder: Secondary | ICD-10-CM

## 2024-11-05 LAB — AEROBIC/ANAEROBIC CULTURE W GRAM STAIN (SURGICAL/DEEP WOUND)
Culture: NO GROWTH
Culture: NO GROWTH
Gram Stain: NONE SEEN
Gram Stain: NONE SEEN
Gram Stain: NONE SEEN

## 2024-11-13 ENCOUNTER — Encounter: Payer: Self-pay | Admitting: Oncology

## 2024-11-19 ENCOUNTER — Ambulatory Visit: Admitting: Neurosurgery

## 2024-12-03 ENCOUNTER — Encounter: Payer: Self-pay | Admitting: Ophthalmology

## 2024-12-05 ENCOUNTER — Encounter: Payer: Self-pay | Admitting: Ophthalmology

## 2024-12-05 NOTE — Anesthesia Preprocedure Evaluation (Signed)
 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

## 2024-12-05 NOTE — Discharge Instructions (Signed)

## 2024-12-10 ENCOUNTER — Ambulatory Visit: Payer: Self-pay | Admitting: Anesthesiology

## 2024-12-10 ENCOUNTER — Encounter: Payer: Self-pay | Admitting: Ophthalmology

## 2024-12-10 ENCOUNTER — Encounter: Admission: RE | Disposition: A | Payer: Self-pay | Attending: Ophthalmology

## 2024-12-10 ENCOUNTER — Other Ambulatory Visit: Payer: Self-pay

## 2024-12-10 ENCOUNTER — Ambulatory Visit
Admission: RE | Admit: 2024-12-10 | Discharge: 2024-12-10 | Disposition: A | Discharge status: Home / Self Care | Attending: Ophthalmology | Admitting: Ophthalmology

## 2024-12-10 DIAGNOSIS — K219 Gastro-esophageal reflux disease without esophagitis: Secondary | ICD-10-CM | POA: Diagnosis not present

## 2024-12-10 DIAGNOSIS — M1990 Unspecified osteoarthritis, unspecified site: Secondary | ICD-10-CM | POA: Insufficient documentation

## 2024-12-10 DIAGNOSIS — H2512 Age-related nuclear cataract, left eye: Secondary | ICD-10-CM | POA: Insufficient documentation

## 2024-12-10 DIAGNOSIS — I10 Essential (primary) hypertension: Secondary | ICD-10-CM | POA: Insufficient documentation

## 2024-12-10 HISTORY — PX: CATARACT EXTRACTION W/PHACO: SHX586

## 2024-12-10 HISTORY — DX: Morbid (severe) obesity due to excess calories: E66.01

## 2024-12-10 SURGERY — PHACOEMULSIFICATION, CATARACT, WITH IOL INSERTION
Anesthesia: Monitor Anesthesia Care | Laterality: Left

## 2024-12-10 MED ORDER — FENTANYL CITRATE (PF) 100 MCG/2ML IJ SOLN
INTRAMUSCULAR | Status: AC
  Filled 2024-12-10: qty 2

## 2024-12-10 MED ORDER — CYCLOPENTOLATE HCL 2 % OP SOLN
OPHTHALMIC | Status: AC
  Filled 2024-12-10: qty 2

## 2024-12-10 MED ORDER — PHENYLEPHRINE HCL 10 % OP SOLN
OPHTHALMIC | Status: AC
  Filled 2024-12-10: qty 5

## 2024-12-10 MED ORDER — PHENYLEPHRINE HCL 10 % OP SOLN
1.0000 [drp] | OPHTHALMIC | Status: AC | PRN
  Administered 2024-12-10 (×3): 1 [drp] via OPHTHALMIC

## 2024-12-10 MED ORDER — TETRACAINE HCL 0.5 % OP SOLN
1.0000 [drp] | OPHTHALMIC | Status: DC | PRN
  Administered 2024-12-10 (×3): 1 [drp] via OPHTHALMIC

## 2024-12-10 MED ORDER — LIDOCAINE HCL (PF) 2 % IJ SOLN
INTRAOCULAR | Status: DC | PRN
  Administered 2024-12-10: 2 mL

## 2024-12-10 MED ORDER — LACTATED RINGERS IV SOLN: INTRAVENOUS | Status: DC

## 2024-12-10 MED ORDER — MIDAZOLAM HCL 2 MG/2ML IJ SOLN
INTRAMUSCULAR | Status: AC
  Filled 2024-12-10: qty 2

## 2024-12-10 MED ORDER — SIGHTPATH DOSE#1 BSS IO SOLN
INTRAOCULAR | Status: DC | PRN
  Administered 2024-12-10: 62 mL via OPHTHALMIC

## 2024-12-10 MED ORDER — SIGHTPATH DOSE#1 NA CHONDROIT SULF-NA HYALURON 40-17 MG/ML IO SOLN
INTRAOCULAR | Status: DC | PRN
  Administered 2024-12-10: 1 mL via INTRAOCULAR

## 2024-12-10 MED ORDER — MOXIFLOXACIN HCL 0.5 % OP SOLN
OPHTHALMIC | Status: DC | PRN
  Administered 2024-12-10: .2 mL via OPHTHALMIC

## 2024-12-10 MED ORDER — SIGHTPATH DOSE#1 BSS IO SOLN
INTRAOCULAR | Status: DC | PRN
  Administered 2024-12-10: 15 mL via INTRAOCULAR

## 2024-12-10 MED ORDER — TETRACAINE HCL 0.5 % OP SOLN
OPHTHALMIC | Status: AC
  Filled 2024-12-10: qty 4

## 2024-12-10 MED ORDER — FENTANYL CITRATE (PF) 100 MCG/2ML IJ SOLN
INTRAMUSCULAR | Status: DC | PRN
  Administered 2024-12-10: 50 ug via INTRAVENOUS

## 2024-12-10 MED ORDER — BRIMONIDINE TARTRATE-TIMOLOL 0.2-0.5 % OP SOLN
OPHTHALMIC | Status: DC | PRN
  Administered 2024-12-10: 1 [drp] via OPHTHALMIC

## 2024-12-10 MED ORDER — MIDAZOLAM HCL (PF) 2 MG/2ML IJ SOLN
INTRAMUSCULAR | Status: DC | PRN
  Administered 2024-12-10: 2 mg via INTRAVENOUS

## 2024-12-10 MED ORDER — CYCLOPENTOLATE HCL 2 % OP SOLN
1.0000 [drp] | OPHTHALMIC | Status: AC | PRN
  Administered 2024-12-10 (×3): 1 [drp] via OPHTHALMIC

## 2024-12-10 SURGICAL SUPPLY — 10 items
CANNULA ANT/CHMB 27G (MISCELLANEOUS) ×1 IMPLANT
CYSTOTOME ANGL RVRS SHRT 25G (CUTTER) ×1 IMPLANT
FEE CATARACT SUITE SIGHTPATH (MISCELLANEOUS) ×1 IMPLANT
GLOVE BIOGEL PI IND STRL 8 (GLOVE) ×1 IMPLANT
GLOVE SURG LX STRL 8.0 MICRO (GLOVE) ×1 IMPLANT
GLOVE SURG SYN 6.5 PF PI BL (GLOVE) ×1 IMPLANT
LENS IOL TECNIS EYHANCE 21.0 (Intraocular Lens) IMPLANT
NDL FILTER BLUNT 18X1 1/2 (NEEDLE) ×1 IMPLANT
NEEDLE FILTER BLUNT 18X1 1/2 (NEEDLE) ×1 IMPLANT
SYR 3ML LL SCALE MARK (SYRINGE) ×1 IMPLANT

## 2024-12-10 NOTE — Anesthesia Postprocedure Evaluation (Signed)
"   Anesthesia Post Note  Patient: IDALYS KONECNY  Procedure(s) Performed: PHACOEMULSIFICATION, CATARACT, WITH IOL INSERTION 6.82 00:44.2 (Left)  Patient location during evaluation: PACU Anesthesia Type: MAC Level of consciousness: awake and alert Pain management: pain level controlled Vital Signs Assessment: post-procedure vital signs reviewed and stable Respiratory status: spontaneous breathing, nonlabored ventilation, respiratory function stable and patient connected to nasal cannula oxygen Cardiovascular status: stable and blood pressure returned to baseline Postop Assessment: no apparent nausea or vomiting Anesthetic complications: no   No notable events documented.   Last Vitals:  Vitals:   12/10/24 1001 12/10/24 1006  BP:  (!) 157/74  Pulse: 93 93  Resp: 16 19  Temp: (!) 36.1 C 36.5 C  SpO2: 95% 96%    Last Pain:  Vitals:   12/10/24 1006  TempSrc:   PainSc: 0-No pain                 Lafreda Casebeer C Dez Stauffer      "

## 2024-12-10 NOTE — Anesthesia Preprocedure Evaluation (Addendum)
"                                    Anesthesia Evaluation  Patient identified by MRN, date of birth, ID band Patient awake    Reviewed: Allergy  & Precautions, H&P , NPO status , Patient's Chart, lab work & pertinent test results  Airway Mallampati: III  TM Distance: >3 FB Neck ROM: Full   Comment: Known difficult airway Limited oral opening Limited neck movement--has had 3 neck surgeries Dental no notable dental hx.    Pulmonary neg pulmonary ROS   Pulmonary exam normal breath sounds clear to auscultation       Cardiovascular hypertension, negative cardio ROS Normal cardiovascular exam Rhythm:Regular Rate:Normal     Neuro/Psych negative neurological ROS  negative psych ROS   GI/Hepatic negative GI ROS, Neg liver ROS,,,  Endo/Other  negative endocrine ROS    Renal/GU negative Renal ROS  negative genitourinary   Musculoskeletal negative musculoskeletal ROS (+)    Abdominal   Peds negative pediatric ROS (+)  Hematology negative hematology ROS (+)   Anesthesia Other Findings Previous cataract surgery 12-10-24 Dr. 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)  Difficult airway for intubation Limited ability to open mouth  Chronic limitation of movement of neck Morbid obesity with BMI of 40.0-44.9, adult (HCC)      Reproductive/Obstetrics negative OB ROS                               Anesthesia Physical Anesthesia Plan  ASA: 3  Anesthesia Plan: MAC   Post-op Pain Management:    Induction: Intravenous  PONV Risk Score and Plan:   Airway Management Planned: Natural Airway and Nasal Cannula  Additional Equipment:   Intra-op Plan:   Post-operative Plan:   Informed Consent: I have reviewed the patients History and Physical, chart, labs and discussed the procedure including the risks, benefits and alternatives for the proposed anesthesia with the patient or authorized representative who has indicated his/her understanding and acceptance.     Dental Advisory Given  Plan Discussed with: Anesthesiologist, CRNA and Surgeon  Anesthesia Plan Comments: (Patient consented for risks of anesthesia including but not limited to:  - adverse reactions to medications - damage to eyes, teeth, lips or other oral mucosa - nerve damage due to positioning  - sore throat or hoarseness - Damage to heart, brain, nerves, lungs, other parts of body or loss of life  Patient voiced understanding and assent.)         Anesthesia Quick Evaluation  "

## 2024-12-10 NOTE — Transfer of Care (Signed)
 Immediate Anesthesia Transfer of Care Note  Patient: Tamara Dodson  Procedure(s) Performed: PHACOEMULSIFICATION, CATARACT, WITH IOL INSERTION 6.82 00:44.2 (Left)  Patient Location: PACU  Anesthesia Type: MAC  Level of Consciousness: awake, alert  and patient cooperative  Airway and Oxygen Therapy: Patient Spontanous Breathing and Patient connected to supplemental oxygen  Post-op Assessment: Post-op Vital signs reviewed, Patient's Cardiovascular Status Stable, Respiratory Function Stable, Patent Airway and No signs of Nausea or vomiting  Post-op Vital Signs: Reviewed and stable  Complications: No notable events documented.

## 2024-12-10 NOTE — H&P (Signed)
 [facility]   Primary Care Physician:  Valora Lynwood FALCON, MD Ophthalmologist: Dr. Elsie Carmine  Pre-Procedure History & Physical: HPI:  Tamara Dodson is a 63 y.o. female here for cataract surgery.   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    Lumbago    Lymphedema of lower extremity    Migraines    Morbid obesity (HCC)    Morbid obesity with BMI of 40.0-44.9, adult (HCC)    Neuropathy of both feet    Nonalcoholic fatty liver disease    Osteoarthritis    Portal venous hypertension (HCC)    Radiculopathy of cervical region    Vertigo     Past Surgical History:  Procedure Laterality Date   ANTERIOR CERVICAL DECOMP/DISCECTOMY FUSION N/A 02/24/2021   Procedure: ANTERIOR CERVICAL DECOMPRESSION/DISCECTOMY FUSION 2 LEVELS C3-5;  Surgeon: Clois Fret, MD;  Location: [facility];  Service: Neurosurgery;  Laterality: N/A;   ANTERIOR CERVICAL DECOMP/DISCECTOMY FUSION N/A 01/26/2022   Procedure: C5-6 ANTERIOR CERVICAL DISCECTOMY & FUSION (GLOBUS HEDRON);  Surgeon: Clois Fret, MD;  Location: [facility];  Service: Neurosurgery;  Laterality: N/A;   ANTERIOR CERVICAL DECOMP/DISCECTOMY FUSION N/A 09/04/2023   Procedure: C6-7 ANTERIOR CERVICAL DISCECTOMY AND FUSION (HEDRON);  Surgeon: Clois Fret, MD;  Location: [facility];  Service: Neurosurgery;  Laterality: N/A;   APPENDECTOMY     BICEPT  TENODESIS Left 10/22/2024   Procedure: TENODESIS, BICEPS, ROTATOR CUFF REPAIR;  Surgeon: Tobie Priest, MD;  Location: [facility];  Service: Orthopedics;  Laterality: Left;  Left total shoulder arthroplasty, biceps tenodesis   CARPAL TUNNEL RELEASE Right 01/23/2024   Procedure: RIGHT CARPAL TUNNEL RELEASE WITH ULTRASOUND GUIDANCE, POSSIBLE CONVERT TO OPEN;  Surgeon: Claudene Penne ORN, MD;  Location: [facility];  Service: Neurosurgery;  Laterality: Right;   CARPAL TUNNEL RELEASE Left 03/05/2024   Procedure: LEFT CARPAL TUNNEL RELEASE WITH ULTRASOUND GUIDANCE, POSSIBLE CONVERT TO OPEN;  Surgeon: Claudene Penne ORN, MD;  Location: [facility];  Service: Neurosurgery;  Laterality: Left;   CHOLECYSTECTOMY  1983   COLONOSCOPY  08/22/2013   COLONOSCOPY N/A 03/22/2023   Procedure: COLONOSCOPY;  Surgeon: Toledo, Ladell POUR, MD;  Location: [facility];  Service: Gastroenterology;  Laterality: N/A;   ESOPHAGOGASTRODUODENOSCOPY N/A 01/25/2021   Procedure: ESOPHAGOGASTRODUODENOSCOPY (EGD);  Surgeon: Toledo, Ladell POUR, MD;  Location: [facility];  Service: Gastroenterology;  Laterality: N/A;  C-19 test on 01/22/2021 Requesting to be the last case due to her transportation   ESOPHAGOGASTRODUODENOSCOPY N/A 03/22/2023   Procedure: ESOPHAGOGASTRODUODENOSCOPY (EGD);  Surgeon: Toledo, Ladell POUR, MD;  Location: [facility];  Service: Gastroenterology;  Laterality: N/A;   EXAM UNDER ANESTHESIA WITH MANIPULATION OF SHOULDER Left 03/12/2024   Procedure: EXAM UNDER ANESTHESIA, SHOULDER, WITH MANIPULATION;  Surgeon: Tobie Priest, MD;  Location: [facility];  Service: Orthopedics;  Laterality: Left;   KNEE  ARTHROPLASTY Right 09/21/2015   Procedure: COMPUTER ASSISTED TOTAL KNEE ARTHROPLASTY;  Surgeon: Lynwood SHAUNNA Hue, MD;  Location: [facility];  Service: Orthopedics;  Laterality: Right;   KNEE ARTHROSCOPY Right 09/14/2011   POSTERIOR LUMBAR FUSION 2 WITH HARDWARE REMOVAL Left 03/12/2024   Procedure: ARTHROSCOPY, SHOULDER WITH DEBRIDEMENT;   Surgeon: Tobie Priest, MD;  Location: [facility];  Service: Orthopedics;  Laterality: Left;  Right shoulder arthroscopic lysis of adhesions, capsular release, and manipulation under anesthesia with corticosteroid injection   SHOULDER ARTHROSCOPY Left 04/11/2022   Procedure: Left shoulder arthroscopic regeneten patch, biceps tenodesis, subacromial decompression;  Surgeon: Tobie Priest, MD;  Location: [facility];  Service: Orthopedics;  Laterality: Left;   SHOULDER ARTHROSCOPY WITH OPEN ROTATOR CUFF REPAIR Left 08/11/2022   Procedure: Left revision shoulder arthroscopic rotator cuff repair and extensive glenohumeral debridement;  Surgeon: Tobie Priest, MD;  Location: [facility];  Service: Orthopedics;  Laterality: Left;  needs potassium draw   TONSILLECTOMY     TOTAL HIP ARTHROPLASTY Left 02/14/2011   TOTAL HIP ARTHROPLASTY Right 11/17/2017   Procedure: TOTAL HIP ARTHROPLASTY;  Surgeon: Hue Lynwood SHAUNNA, MD;  Location: [facility];  Service: Orthopedics;  Laterality: Right;   TOTAL KNEE ARTHROPLASTY Left 02/18/2013   TOTAL SHOULDER ARTHROPLASTY Left 10/22/2024   Procedure: ARTHROPLASTY, SHOULDER, TOTAL;  Surgeon: Tobie Priest, MD;  Location: [facility];  Service: Orthopedics;  Laterality: Left;  Left total shoulder arthroplasty, biceps tenodesis   WISDOM TOOTH EXTRACTION      Prior to Admission medications  Medication Sig Start Date End Date Taking? Authorizing Provider  acetaminophen  (TYLENOL ) 500 MG tablet Take 2 tablets (1,000 mg total) by mouth every 8 (eight) hours. 10/22/24 10/22/25 Yes Tobie Priest, MD  furosemide  (LASIX ) 20 MG tablet Take 20 mg by mouth 2 (two) times daily.   Yes [provider]  gabapentin (NEURONTIN) 400 MG capsule Take 800 mg by mouth 2 (two) times daily.   Yes [provider]  losartan (COZAAR) 50 MG tablet Take 50 mg by mouth every morning. 01/03/23 01/09/27 Yes [provider]  meclizine (ANTIVERT) 12.5 MG tablet Take 12.5-25 mg by mouth 2 (two)  times daily. 2 tablets in the morning and 1 tablet at night 04/20/21  Yes [provider]  traZODone (DESYREL) 150 MG tablet Take 150 mg by mouth at bedtime. 10/24/23 12/03/24 Yes [provider]  baclofen (LIORESAL) 10 MG tablet Take 20 mg by mouth 2 (two) times a week. Monday and Thursdays Patient not taking: Reported on 12/03/2024 05/01/24   [provider]  Rimegepant Sulfate 75 MG TBDP Take 75 mg once daily as needed for headache rescue 07/10/24   [provider]  zonisamide (ZONEGRAN) 100 MG capsule Take 200 mg by mouth at bedtime. Patient not taking: Reported on 12/03/2024 05/01/24   [provider]    Allergies as of 11/21/2024 - Review Complete 10/22/2024  Allergen Reaction Noted   Gluten meal Diarrhea 02/03/2021    Family History  Problem Relation Age of Onset   Breast cancer Mother        77's    Social History   Socioeconomic History   Marital status: Married    Spouse name: Ozell    Number of children: 3   Years of education: Not on file   Highest education level: Not on file  Occupational History   Not on file  Tobacco Use   Smoking status: Never    Passive exposure: Never   Smokeless tobacco: Never  Vaping Use  Vaping status: Never Used  Substance and Sexual Activity   Alcohol use: No   Drug use: No   Sexual activity: Yes  Other Topics Concern   Not on file  Social History Narrative   Not on file   Social Drivers of Health   Tobacco Use: Low Risk (12/10/2024)   Patient History    Smoking Tobacco Use: Never    Smokeless Tobacco Use: Never    Passive Exposure: Never  Financial Resource Strain: Low Risk  (12/04/2024)   Received from [facility]   Overall Financial Resource Strain (CARDIA)    Difficulty of Paying Living Expenses: Not hard at all  Food Insecurity: No Food Insecurity (12/04/2024)   Received from [facility]   Epic    Within the past 12 months, you worried  that your food would run out before you got the money to buy more.: Never true    Within the past 12 months, the food you bought just didn't last and you didn't have money to get more.: Never true  Transportation Needs: No Transportation Needs (12/04/2024)   Received from [facility]    In the past 12 months, has lack of transportation kept you from medical appointments or from getting medications?: No    Lack of Transportation (Non-Medical): No  Physical Activity: Not on file  Stress: Not on file  Social Connections: Not on file  Intimate Partner Violence: Not on file  Depression (EYV7-0): Not on file  Alcohol Screen: Not on file  Housing: Low Risk  (12/04/2024)   Received from [facility]   Epic    In the last 12 months, was there a time when you were not able to pay the mortgage or rent on time?: No    In the past 12 months, how many times have you moved where you were living?: 0    At any time in the past 12 months, were you homeless or living in a shelter (including now)?: No  Utilities: Not At Risk (12/04/2024)   Received from [facility]   Epic    In the past 12 months has the electric, gas, oil, or water company threatened to shut off services in your home?: No  Health Literacy: Not on file    Review of Systems: See HPI, otherwise negative ROS  Physical Exam: BP (!) 150/72   Pulse 99   Temp 97.7 F (36.5 C) (Temporal)   Ht 5' 11 (1.803 m)   Wt (!) 143.8 kg   SpO2 97%   BMI 44.21 kg/m  General:   Alert, cooperative. Head:  Normocephalic and atraumatic. Respiratory:  Normal work of breathing. Cardiovascular:  NAD  Impression/Plan: Charlies LITTIE Hench is here for cataract surgery.  Risks, benefits, limitations, and alternatives regarding cataract surgery have been reviewed with the patient.  Questions have been answered.  All parties agreeable.   Elsie Carmine, MD  12/10/2024, 9:33  AM

## 2024-12-10 NOTE — Op Note (Signed)
 PREOPERATIVE DIAGNOSIS:  Nuclear sclerotic cataract of the left eye.   POSTOPERATIVE DIAGNOSIS:  Nuclear sclerotic cataract of the left eye.   OPERATIVE PROCEDURE:ORPROCALL@   SURGEON:  Elsie Carmine, MD.   ANESTHESIA:  Anesthesiologist: Ola Donny BROCKS, MD CRNA: Myra Lawless, CRNA  1.      Managed anesthesia care. 2.     0.53ml of Shugarcaine was instilled following the paracentesis   COMPLICATIONS:  None.   TECHNIQUE:   Stop and chop   DESCRIPTION OF PROCEDURE:  The patient was examined and consented in the preoperative holding area where the aforementioned topical anesthesia was applied to the left eye and then brought back to the Operating Room where the left eye was prepped and draped in the usual sterile ophthalmic fashion and a lid speculum was placed. A paracentesis was created with the side port blade and the anterior chamber was filled with viscoelastic. A near clear corneal incision was performed with the steel keratome. A continuous curvilinear capsulorrhexis was performed with a cystotome followed by the capsulorrhexis forceps. Hydrodissection and hydrodelineation were carried out with BSS on a blunt cannula. The lens was removed in a stop and chop  technique and the remaining cortical material was removed with the irrigation-aspiration handpiece. The capsular bag was inflated with viscoelastic and the intraocular lens was placed in the capsular bag without complication. The remaining viscoelastic was removed from the eye with the irrigation-aspiration handpiece. The wounds were hydrated. The anterior chamber was flushed with BSS and the eye was inflated to physiologic pressure. 0.84ml Vigamox  was placed in the anterior chamber. The wounds were found to be water tight. The eye was dressed with Combigan . The patient was given protective glasses to wear throughout the day and a shield with which to sleep tonight. The patient was also given drops with which to begin a drop regimen  today and will follow-up with me in one day. Implant Name Type Inv. Item Serial No. Manufacturer Lot No. LRB No. Used Action  LENS IOL TECNIS EYHANCE 21.0 - [id number] Intraocular Lens LENS IOL TECNIS EYHANCE 21.0 [id number] [employer]  Left 1 Implanted    Procedures: PHACOEMULSIFICATION, CATARACT, WITH IOL INSERTION 6.82 00:44.2 (Left)  Electronically signed: Elsie Carmine 12/10/2024 9:57 AM

## 2024-12-13 ENCOUNTER — Encounter: Payer: Self-pay | Admitting: Ophthalmology

## 2024-12-16 NOTE — Discharge Instructions (Signed)

## 2024-12-17 ENCOUNTER — Encounter: Payer: Self-pay | Admitting: Ophthalmology

## 2024-12-17 ENCOUNTER — Ambulatory Visit
Admission: RE | Admit: 2024-12-17 | Discharge: 2024-12-17 | Disposition: A | Discharge status: Home / Self Care | Attending: Ophthalmology | Admitting: Ophthalmology

## 2024-12-17 ENCOUNTER — Encounter: Payer: Self-pay | Admitting: Oncology

## 2024-12-17 ENCOUNTER — Ambulatory Visit: Payer: Self-pay | Admitting: Anesthesiology

## 2024-12-17 ENCOUNTER — Other Ambulatory Visit: Payer: Self-pay

## 2024-12-17 ENCOUNTER — Encounter
Admission: RE | Disposition: A | Discharge status: Home / Self Care | Payer: Self-pay | Source: Home / Self Care | Attending: Ophthalmology

## 2024-12-17 DIAGNOSIS — I10 Essential (primary) hypertension: Secondary | ICD-10-CM | POA: Insufficient documentation

## 2024-12-17 DIAGNOSIS — H2511 Age-related nuclear cataract, right eye: Secondary | ICD-10-CM | POA: Insufficient documentation

## 2024-12-17 HISTORY — PX: CATARACT EXTRACTION W/PHACO: SHX586

## 2024-12-17 SURGERY — PHACOEMULSIFICATION, CATARACT, WITH IOL INSERTION
Anesthesia: Monitor Anesthesia Care | Site: Eye | Laterality: Right

## 2024-12-17 MED ORDER — SIGHTPATH DOSE#1 BSS IO SOLN
INTRAOCULAR | Status: DC | PRN
  Administered 2024-12-17: 15 mL via INTRAOCULAR

## 2024-12-17 MED ORDER — FENTANYL CITRATE (PF) 100 MCG/2ML IJ SOLN
INTRAMUSCULAR | Status: DC | PRN
  Administered 2024-12-17: 100 ug via INTRAVENOUS

## 2024-12-17 MED ORDER — MIDAZOLAM HCL (PF) 2 MG/2ML IJ SOLN
INTRAMUSCULAR | Status: DC | PRN
  Administered 2024-12-17: 2 mg via INTRAVENOUS

## 2024-12-17 MED ORDER — SIGHTPATH DOSE#1 BSS IO SOLN
INTRAOCULAR | Status: DC | PRN
  Administered 2024-12-17: 42 mL via OPHTHALMIC

## 2024-12-17 MED ORDER — TETRACAINE HCL 0.5 % OP SOLN
OPHTHALMIC | Status: AC
  Filled 2024-12-17: qty 4

## 2024-12-17 MED ORDER — CYCLOPENTOLATE HCL 2 % OP SOLN
OPHTHALMIC | Status: AC
  Filled 2024-12-17: qty 2

## 2024-12-17 MED ORDER — FENTANYL CITRATE (PF) 100 MCG/2ML IJ SOLN
INTRAMUSCULAR | Status: AC
  Filled 2024-12-17: qty 2

## 2024-12-17 MED ORDER — LIDOCAINE HCL (PF) 2 % IJ SOLN
INTRAOCULAR | Status: DC | PRN
  Administered 2024-12-17: 2 mL

## 2024-12-17 MED ORDER — PHENYLEPHRINE HCL 10 % OP SOLN
OPHTHALMIC | Status: AC
  Filled 2024-12-17: qty 5

## 2024-12-17 MED ORDER — PHENYLEPHRINE HCL 10 % OP SOLN
1.0000 [drp] | OPHTHALMIC | Status: AC | PRN
  Administered 2024-12-17 (×3): 1 [drp] via OPHTHALMIC

## 2024-12-17 MED ORDER — MIDAZOLAM HCL 2 MG/2ML IJ SOLN
INTRAMUSCULAR | Status: AC
  Filled 2024-12-17: qty 2

## 2024-12-17 MED ORDER — SIGHTPATH DOSE#1 NA CHONDROIT SULF-NA HYALURON 40-17 MG/ML IO SOLN
INTRAOCULAR | Status: DC | PRN
  Administered 2024-12-17: 1 mL via INTRAOCULAR

## 2024-12-17 MED ORDER — TETRACAINE HCL 0.5 % OP SOLN
1.0000 [drp] | OPHTHALMIC | Status: DC | PRN
  Administered 2024-12-17 (×3): 1 [drp] via OPHTHALMIC

## 2024-12-17 MED ORDER — LACTATED RINGERS IV SOLN: INTRAVENOUS | Status: DC

## 2024-12-17 MED ORDER — MOXIFLOXACIN HCL 0.5 % OP SOLN
OPHTHALMIC | Status: DC | PRN
  Administered 2024-12-17: .2 mL via OPHTHALMIC

## 2024-12-17 MED ORDER — CYCLOPENTOLATE HCL 2 % OP SOLN
1.0000 [drp] | OPHTHALMIC | Status: DC | PRN
  Administered 2024-12-17 (×2): 1 [drp] via OPHTHALMIC

## 2024-12-17 MED ORDER — BRIMONIDINE TARTRATE-TIMOLOL 0.2-0.5 % OP SOLN
OPHTHALMIC | Status: DC | PRN
  Administered 2024-12-17: 1 [drp] via OPHTHALMIC

## 2024-12-17 SURGICAL SUPPLY — 9 items
CANNULA ANT/CHMB 27G (MISCELLANEOUS) ×1 IMPLANT
CYSTOTOME ANGL RVRS SHRT 25G (CUTTER) ×1 IMPLANT
FEE CATARACT SUITE SIGHTPATH (MISCELLANEOUS) ×1 IMPLANT
GLOVE BIOGEL PI IND STRL 8 (GLOVE) ×1 IMPLANT
GLOVE SURG LX STRL 8.0 MICRO (GLOVE) ×1 IMPLANT
GLOVE SURG SYN 6.5 PF PI BL (GLOVE) ×1 IMPLANT
LENS IOL TECNIS EYHANCE 21.0 (Intraocular Lens) IMPLANT
NEEDLE FILTER BLUNT 18X1 1/2 (NEEDLE) ×1 IMPLANT
SYR 3ML LL SCALE MARK (SYRINGE) ×1 IMPLANT

## 2024-12-17 NOTE — Op Note (Signed)
 PREOPERATIVE DIAGNOSIS:  Nuclear sclerotic cataract of the right eye.   POSTOPERATIVE DIAGNOSIS:  Right Eye Cataract   OPERATIVE PROCEDURE:ORPROCALL@   SURGEON:  Elsie Carmine, MD.   ANESTHESIA:  Anesthesiologist: Ola Donny BROCKS, MD CRNA: Veronica Alm BROCKS, CRNA  1.      Managed anesthesia care. 2.      0.80ml of Shugarcaine was instilled in the eye following the paracentesis.   COMPLICATIONS:  None.   TECHNIQUE:   Stop and chop   DESCRIPTION OF PROCEDURE:  The patient was examined and consented in the preoperative holding area where the aforementioned topical anesthesia was applied to the right eye and then brought back to the Operating Room where the right eye was prepped and draped in the usual sterile ophthalmic fashion and a lid speculum was placed. A paracentesis was created with the side port blade and the anterior chamber was filled with viscoelastic. A near clear corneal incision was performed with the steel keratome. A continuous curvilinear capsulorrhexis was performed with a cystotome followed by the capsulorrhexis forceps. Hydrodissection and hydrodelineation were carried out with BSS on a blunt cannula. The lens was removed in a stop and chop  technique and the remaining cortical material was removed with the irrigation-aspiration handpiece. The capsular bag was inflated with viscoelastic and the intraocular lens was placed in the capsular bag without complication. The remaining viscoelastic was removed from the eye with the irrigation-aspiration handpiece. The wounds were hydrated. The anterior chamber was flushed with BSS and the eye was inflated to physiologic pressure. 0.1ml of Vigamox  was placed in the anterior chamber. The wounds were found to be water tight. The eye was dressed with Combigan . The patient was given protective glasses to wear throughout the day and a shield with which to sleep tonight. The patient was also given drops with which to begin a drop regimen today  and will follow-up with me in one day. Implant Name Type Inv. Item Serial No. Manufacturer Lot No. LRB No. Used Action  LENS IOL TECNIS EYHANCE 21.0 - [id number] Intraocular Lens LENS IOL TECNIS EYHANCE 21.0 [id number] [employer]  Right 1 Implanted   Procedures: PHACOEMULSIFICATION, CATARACT, WITH IOL INSERTION 5.78 00:34.8 (Right)  Electronically signed: Elsie Carmine 12/17/2024 9:07 AM

## 2024-12-17 NOTE — Anesthesia Postprocedure Evaluation (Signed)
"   Anesthesia Post Note  Patient: Tamara Dodson  Procedure(s) Performed: PHACOEMULSIFICATION, CATARACT, WITH IOL INSERTION 5.78 00:34.8 (Right: Eye)  Patient location during evaluation: PACU Anesthesia Type: MAC Level of consciousness: awake and alert Pain management: pain level controlled Vital Signs Assessment: post-procedure vital signs reviewed and stable Respiratory status: spontaneous breathing, nonlabored ventilation, respiratory function stable and patient connected to nasal cannula oxygen Cardiovascular status: stable and blood pressure returned to baseline Postop Assessment: no apparent nausea or vomiting Anesthetic complications: no   No notable events documented.   Last Vitals:  Vitals:   12/17/24 0910 12/17/24 0912  BP: (!) 141/67 (!) 151/78  Pulse: 100 (!) 103  Resp: 17 18  Temp:  36.6 C  SpO2: 94% 93%    Last Pain:  Vitals:   12/17/24 0912  TempSrc:   PainSc: 0-No pain                 Tamara Dodson      "

## 2024-12-17 NOTE — H&P (Signed)
 [facility]   Primary Care Physician:  Valora Lynwood FALCON, MD Ophthalmologist: Dr. Elsie Carmine  Pre-Procedure History & Physical: HPI:  Tamara Dodson is a 63 y.o. female here for cataract surgery.   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    Lumbago    Lymphedema of lower extremity    Migraines    Morbid obesity (HCC)    Morbid obesity with BMI of 40.0-44.9, adult (HCC)    Neuropathy of both feet    Nonalcoholic fatty liver disease    Osteoarthritis    Portal venous hypertension (HCC)    Radiculopathy of cervical region    Vertigo     Past Surgical History:  Procedure Laterality Date   ANTERIOR CERVICAL DECOMP/DISCECTOMY FUSION N/A 02/24/2021   Procedure: ANTERIOR CERVICAL DECOMPRESSION/DISCECTOMY FUSION 2 LEVELS C3-5;  Surgeon: Clois Fret, MD;  Location: [facility];  Service: Neurosurgery;  Laterality: N/A;   ANTERIOR CERVICAL DECOMP/DISCECTOMY FUSION N/A 01/26/2022   Procedure: C5-6 ANTERIOR CERVICAL DISCECTOMY & FUSION (GLOBUS HEDRON);  Surgeon: Clois Fret, MD;  Location: [facility];  Service: Neurosurgery;  Laterality: N/A;   ANTERIOR CERVICAL DECOMP/DISCECTOMY FUSION N/A 09/04/2023   Procedure: C6-7 ANTERIOR CERVICAL DISCECTOMY AND FUSION (HEDRON);  Surgeon: Clois Fret, MD;  Location: [facility];  Service: Neurosurgery;  Laterality: N/A;   APPENDECTOMY     BICEPT  TENODESIS Left 10/22/2024   Procedure: TENODESIS, BICEPS, ROTATOR CUFF REPAIR;  Surgeon: Tobie Priest, MD;  Location: [facility];  Service: Orthopedics;  Laterality: Left;  Left total shoulder arthroplasty, biceps tenodesis   CARPAL TUNNEL RELEASE Right 01/23/2024   Procedure: RIGHT CARPAL TUNNEL RELEASE WITH ULTRASOUND GUIDANCE, POSSIBLE CONVERT TO OPEN;  Surgeon: Claudene Penne ORN, MD;  Location: [facility];  Service: Neurosurgery;  Laterality: Right;   CARPAL TUNNEL RELEASE Left 03/05/2024   Procedure: LEFT CARPAL TUNNEL RELEASE WITH ULTRASOUND GUIDANCE, POSSIBLE CONVERT TO OPEN;  Surgeon: Claudene Penne ORN, MD;  Location: [facility];  Service: Neurosurgery;  Laterality: Left;   CATARACT EXTRACTION W/PHACO Left 12/10/2024   Procedure: PHACOEMULSIFICATION, CATARACT, WITH IOL INSERTION 6.82 00:44.2;  Surgeon: Carmine Elsie, MD;  Location: [facility];  Service: Ophthalmology;  Laterality: Left;   CHOLECYSTECTOMY  1983   COLONOSCOPY  08/22/2013   COLONOSCOPY N/A 03/22/2023   Procedure: COLONOSCOPY;  Surgeon: Toledo, Ladell POUR, MD;  Location: [facility];  Service: Gastroenterology;  Laterality: N/A;   ESOPHAGOGASTRODUODENOSCOPY N/A 01/25/2021   Procedure: ESOPHAGOGASTRODUODENOSCOPY (EGD);  Surgeon: Toledo, Ladell POUR, MD;  Location: [facility];  Service: Gastroenterology;  Laterality: N/A;  C-19 test on 01/22/2021 Requesting to be the last case due to her transportation   ESOPHAGOGASTRODUODENOSCOPY N/A 03/22/2023   Procedure: ESOPHAGOGASTRODUODENOSCOPY (EGD);  Surgeon: Toledo, Ladell POUR, MD;  Location: [facility];  Service: Gastroenterology;  Laterality: N/A;   EXAM UNDER ANESTHESIA  WITH MANIPULATION OF SHOULDER Left 03/12/2024   Procedure: EXAM UNDER ANESTHESIA, SHOULDER, WITH MANIPULATION;  Surgeon: Tobie Priest, MD;  Location: [facility];  Service: Orthopedics;  Laterality: Left;   KNEE ARTHROPLASTY Right 09/21/2015   Procedure: COMPUTER ASSISTED TOTAL KNEE ARTHROPLASTY;  Surgeon: Lynwood SHAUNNA Hue, MD;  Location: [facility];  Service: Orthopedics;  Laterality: Right;   KNEE ARTHROSCOPY Right 09/14/2011   POSTERIOR LUMBAR FUSION 2 WITH HARDWARE REMOVAL Left 03/12/2024   Procedure: ARTHROSCOPY, SHOULDER WITH DEBRIDEMENT;  Surgeon: Tobie Priest, MD;  Location: [facility];  Service: Orthopedics;  Laterality: Left;  Right shoulder arthroscopic lysis of adhesions, capsular release, and manipulation under anesthesia with corticosteroid injection   SHOULDER ARTHROSCOPY Left 04/11/2022   Procedure: Left shoulder arthroscopic regeneten patch, biceps tenodesis, subacromial decompression;  Surgeon: Tobie Priest, MD;  Location: [facility];  Service: Orthopedics;  Laterality: Left;   SHOULDER ARTHROSCOPY WITH OPEN ROTATOR CUFF REPAIR Left 08/11/2022   Procedure: Left revision shoulder arthroscopic rotator cuff repair and extensive glenohumeral debridement;  Surgeon: Tobie Priest, MD;  Location: [facility];  Service: Orthopedics;  Laterality: Left;  needs potassium draw   TONSILLECTOMY     TOTAL HIP ARTHROPLASTY Left 02/14/2011   TOTAL HIP ARTHROPLASTY Right 11/17/2017   Procedure: TOTAL HIP ARTHROPLASTY;  Surgeon: Hue Lynwood SHAUNNA, MD;  Location: [facility];  Service: Orthopedics;  Laterality: Right;   TOTAL KNEE ARTHROPLASTY Left 02/18/2013   TOTAL SHOULDER ARTHROPLASTY Left 10/22/2024   Procedure: ARTHROPLASTY, SHOULDER, TOTAL;  Surgeon: Tobie Priest, MD;  Location: [facility];  Service: Orthopedics;  Laterality: Left;  Left total shoulder arthroplasty, biceps tenodesis   WISDOM TOOTH EXTRACTION      Prior to Admission medications  Medication Sig Start Date End Date Taking? Authorizing Provider  furosemide  (LASIX ) 20 MG tablet Take 20 mg by mouth 2 (two) times daily.   Yes [provider]  gabapentin (NEURONTIN) 400 MG capsule Take 800 mg by mouth 2 (two) times daily.   Yes [provider]  losartan (COZAAR) 50 MG tablet Take 50 mg by mouth every morning. 01/03/23 01/09/27 Yes  [provider]  meclizine (ANTIVERT) 12.5 MG tablet Take 12.5-25 mg by mouth 2 (two) times daily. 2 tablets in the morning and 1 tablet at night 04/20/21  Yes [provider]  acetaminophen  (TYLENOL ) 500 MG tablet Take 2 tablets (1,000 mg total) by mouth every 8 (eight) hours. 10/22/24 10/22/25  Tobie Priest, MD  baclofen (LIORESAL) 10 MG tablet Take 20 mg by mouth 2 (two) times a week. Monday and Thursdays Patient not taking: Reported on 12/03/2024 05/01/24   [provider]  Rimegepant Sulfate 75 MG TBDP Take 75 mg once daily as needed for headache rescue 07/10/24   [provider]  traZODone (DESYREL) 150 MG tablet Take 150 mg by mouth at bedtime. 10/24/23 12/03/24  [provider]  zonisamide (ZONEGRAN) 100 MG capsule Take 200 mg by mouth at bedtime. Patient not taking: Reported on 12/03/2024 05/01/24   [provider]    Allergies as of 11/21/2024 - Review Complete 10/22/2024  Allergen Reaction Noted   Gluten meal Diarrhea 02/03/2021    Family History  Problem Relation Age of Onset   Breast cancer Mother        5's    Social History   Socioeconomic History   Marital status: Married    Spouse name: Ozell    Number of children: 3   Years of education: Not on file   Highest education level:  Not on file  Occupational History   Not on file  Tobacco Use   Smoking status: Never    Passive exposure: Never   Smokeless tobacco: Never  Vaping Use   Vaping status: Never Used  Substance and Sexual Activity   Alcohol use: No   Drug use: No   Sexual activity: Yes  Other Topics Concern   Not on file  Social History Narrative   Not on file   Social Drivers of Health   Tobacco Use: Low Risk  (12/14/2024)   Received from [facility]   Patient History    Smoking Tobacco Use: Never    Smokeless Tobacco Use: Never    Passive Exposure: Not on file  Financial Resource Strain: Low Risk  (12/04/2024)   Received from [facility]   Overall Financial Resource Strain (CARDIA)    Difficulty of Paying Living Expenses: Not hard at all  Food Insecurity: No Food Insecurity (12/04/2024)   Received from [facility]   Epic    Within the past 12 months, you worried that your food would run out before you got the money to buy more.: Never true    Within the past 12 months, the food you bought just didn't last and you didn't have money to get more.: Never true  Transportation Needs: No Transportation Needs (12/04/2024)   Received from [facility] - Transportation    In the past 12 months, has lack of transportation kept you from medical appointments or from getting medications?: No    Lack of Transportation (Non-Medical): No  Physical Activity: Not on file  Stress: Not on file  Social Connections: Not on file  Intimate Partner Violence: Not on file  Depression (EYV7-0): Not on file  Alcohol Screen: Not on file  Housing: Low Risk  (12/04/2024)   Received from [facility]   Epic    In the last 12 months, was there a time when you were not able to pay the mortgage or rent on time?: No    In the past 12 months, how many times have you moved where you were living?: 0    At any time in the past 12 months, were you homeless or living in a shelter (including now)?: No  Utilities: Not At Risk (12/04/2024)   Received from [facility]   Epic    In the past 12 months has the electric, gas, oil, or water company threatened to shut off services in your home?: No  Health Literacy: Not on file    Review of Systems: See HPI, otherwise negative ROS  Physical Exam: BP 133/80   Pulse (!) 110   Temp 98.5 F (36.9 C) (Temporal)   Resp 18   Ht 5' 11 (1.803 m)   Wt (!) 142 kg   SpO2 97%   BMI 43.65 kg/m  General:   Alert, cooperative. Head:  Normocephalic and atraumatic. Respiratory:  Normal work of breathing. Cardiovascular:   NAD  Impression/Plan: Charlies LITTIE Hench is here for cataract surgery.  Risks, benefits, limitations, and alternatives regarding cataract surgery have been reviewed with the patient.  Questions have been answered.  All parties agreeable.   Elsie Carmine, MD  12/17/2024, 8:44 AM

## 2024-12-17 NOTE — Transfer of Care (Addendum)
 Immediate Anesthesia Transfer of Care Note  Patient: Tamara Dodson  Procedure(s) Performed: PHACOEMULSIFICATION, CATARACT, WITH IOL INSERTION (Right: Eye)  Patient Location: PACU  Anesthesia Type: MAC  Level of Consciousness: awake, alert  and patient cooperative  Airway and Oxygen Therapy: Patient Spontanous Breathing   Post-op Assessment: Post-op Vital signs reviewed, Patient's Cardiovascular Status Stable, Respiratory Function Stable, Patent Airway and No signs of Nausea or vomiting  Post-op Vital Signs: Reviewed and stable  Complications: No notable events documented.

## 2024-12-23 DIAGNOSIS — Z96612 Presence of left artificial shoulder joint: Secondary | ICD-10-CM

## 2024-12-24 ENCOUNTER — Encounter: Payer: Self-pay | Admitting: Orthopedic Surgery

## 2024-12-31 ENCOUNTER — Ambulatory Visit
Admission: RE | Admit: 2024-12-31 | Discharge: 2024-12-31 | Disposition: A | Discharge status: Home / Self Care | Source: Ambulatory Visit | Attending: Orthopedic Surgery | Admitting: Orthopedic Surgery

## 2024-12-31 DIAGNOSIS — Z96612 Presence of left artificial shoulder joint: Secondary | ICD-10-CM | POA: Insufficient documentation

## 2025-01-08 ENCOUNTER — Other Ambulatory Visit: Payer: Self-pay | Admitting: Orthopedic Surgery

## 2025-01-22 ENCOUNTER — Other Ambulatory Visit: Payer: Self-pay

## 2025-01-22 ENCOUNTER — Inpatient Hospital Stay
Admission: RE | Admit: 2025-01-22 | Discharge: 2025-01-22 | Disposition: A | Source: Ambulatory Visit | Attending: Orthopedic Surgery

## 2025-01-22 VITALS — BP 159/72 | HR 103 | Resp 18 | Wt 300.9 lb

## 2025-01-22 DIAGNOSIS — Z01812 Encounter for preprocedural laboratory examination: Secondary | ICD-10-CM | POA: Insufficient documentation

## 2025-01-22 HISTORY — DX: Other complications of anesthesia, initial encounter: T88.59XA

## 2025-01-22 HISTORY — DX: Pneumonia, unspecified organism: J18.9

## 2025-01-22 LAB — COMPREHENSIVE METABOLIC PANEL WITH GFR
ALT: 36 U/L (ref 0–44)
AST: 99 U/L — ABNORMAL HIGH (ref 15–41)
Albumin: 3.9 g/dL (ref 3.5–5.0)
Alkaline Phosphatase: 166 U/L — ABNORMAL HIGH (ref 38–126)
Anion gap: 12 (ref 5–15)
BUN: 7 mg/dL — ABNORMAL LOW (ref 8–23)
CO2: 22 mmol/L (ref 22–32)
Calcium: 9.2 mg/dL (ref 8.9–10.3)
Chloride: 103 mmol/L (ref 98–111)
Creatinine, Ser: 0.67 mg/dL (ref 0.44–1.00)
GFR, Estimated: >60 mL/min
Glucose, Bld: 121 mg/dL — ABNORMAL HIGH (ref 70–99)
Potassium: 4.1 mmol/L (ref 3.5–5.1)
Sodium: 137 mmol/L (ref 135–145)
Total Bilirubin: 1.2 mg/dL (ref 0.0–1.2)
Total Protein: 8.4 g/dL — ABNORMAL HIGH (ref 6.5–8.1)

## 2025-01-22 LAB — CBC WITH DIFFERENTIAL/PLATELET
Abs Immature Granulocytes: 0.03 10*3/uL (ref 0.00–0.07)
Basophils Absolute: 0.1 10*3/uL (ref 0.0–0.1)
Basophils Relative: 1 %
Eosinophils Absolute: 0.1 10*3/uL (ref 0.0–0.5)
Eosinophils Relative: 2 %
HCT: 42.8 % (ref 36.0–46.0)
Hemoglobin: 14.4 g/dL (ref 12.0–15.0)
Immature Granulocytes: 0 %
Lymphocytes Relative: 13 %
Lymphs Abs: 1.1 10*3/uL (ref 0.7–4.0)
MCH: 31.6 pg (ref 26.0–34.0)
MCHC: 33.6 g/dL (ref 30.0–36.0)
MCV: 93.9 fL (ref 80.0–100.0)
Monocytes Absolute: 0.6 10*3/uL (ref 0.1–1.0)
Monocytes Relative: 8 %
Neutro Abs: 6 10*3/uL (ref 1.7–7.7)
Neutrophils Relative %: 76 %
Platelets: 172 10*3/uL (ref 150–400)
RBC: 4.56 MIL/uL (ref 3.87–5.11)
RDW: 12.9 % (ref 11.5–15.5)
WBC: 7.9 10*3/uL (ref 4.0–10.5)
nRBC: 0 % (ref 0.0–0.2)

## 2025-01-22 LAB — URINALYSIS, ROUTINE W REFLEX MICROSCOPIC
Bilirubin Urine: NEGATIVE
Glucose, UA: NEGATIVE mg/dL
Hgb urine dipstick: NEGATIVE
Ketones, ur: NEGATIVE mg/dL
Leukocytes,Ua: NEGATIVE
Nitrite: NEGATIVE
Protein, ur: NEGATIVE mg/dL
Specific Gravity, Urine: 1.009 (ref 1.005–1.030)
pH: 5 (ref 5.0–8.0)

## 2025-01-22 LAB — PROTIME-INR
INR: 1.2 (ref 0.8–1.2)
Prothrombin Time: 15.6 s — ABNORMAL HIGH (ref 11.4–15.2)

## 2025-01-22 LAB — SURGICAL PCR SCREEN
MRSA, PCR: NEGATIVE
Staphylococcus aureus: NEGATIVE

## 2025-01-22 NOTE — Patient Instructions (Addendum)
 Your procedure is scheduled on: 01/28/25 - Tuesday Report to the Registration Desk on the 1st floor of the [facility]. To find out your arrival time, please call [phone] between 1PM - 3PM on: 01/27/25 - Monday If your arrival time is 6:00 am, do not arrive before that time as the [facility] entrance doors do not open until 6:00 am.  REMEMBER: Instructions that are not followed completely may result in serious medical risk, up to and including death; or upon the discretion of your surgeon and anesthesiologist your surgery may need to be rescheduled.  Do not eat food after midnight the night before surgery.  No gum chewing or hard candies.  You may however, drink CLEAR liquids up to 2 hours before you are scheduled to arrive for your surgery. Do not drink anything within 2 hours of your scheduled arrival time.  Clear liquids include: - water  - apple juice without pulp - gatorade (not RED colors) - black coffee or tea (Do NOT add milk or creamers to the coffee or tea) Do NOT drink anything that is not on this list.  In addition, your doctor has ordered for you to drink the provided:  Ensure Pre-Surgery Clear Carbohydrate Drink  Drinking this carbohydrate drink up to two hours before surgery helps to reduce insulin resistance and improve patient outcomes. Please complete drinking 2 hours before scheduled arrival time.  One week prior to surgery: Stop Anti-inflammatories (NSAIDS) such as Advil, Aleve, Ibuprofen, Motrin, Naproxen, Naprosyn and Aspirin  based products such as Excedrin, Goody's Powder, BC Powder. You may continue to take Tylenol  if needed for pain up until the day of surgery.  Stop ANY OVER THE COUNTER supplements until after surgery.  ON THE DAY OF SURGERY ONLY TAKE THESE MEDICATIONS WITH SIPS OF WATER:  gabapentin (NEURONTIN)  meclizine (ANTIVERT)   No Alcohol for 24 hours before or after surgery.  No Smoking including e-cigarettes for 24 hours before  surgery.  No chewable tobacco products for at least 6 hours before surgery.  No nicotine patches on the day of surgery.  Do not use any recreational drugs for at least a week (preferably 2 weeks) before your surgery.  Please be advised that the combination of cocaine and anesthesia may have negative outcomes, up to and including death. If you test positive for cocaine, your surgery will be cancelled.  On the morning of surgery brush your teeth with toothpaste and water, you may rinse your mouth with mouthwash if you wish. Do not swallow any toothpaste or mouthwash.  Use CHG Soap or wipes as directed on instruction sheet.  Do not wear jewelry, make-up, hairpins, clips or nail polish.  For welded (permanent) jewelry: bracelets, anklets, waist bands, etc.  Please have this removed prior to surgery.  If it is not removed, there is a chance that hospital personnel will need to cut it off on the day of surgery.  Do not wear lotions, powders, or perfumes.   Do not shave body hair from the neck down 48 hours before surgery.  Contact lenses, hearing aids and dentures may not be worn into surgery.  Do not bring valuables to the hospital. [facility] is not responsible for any missing/lost belongings or valuables.   Total Shoulder Arthroplasty:  use Benzoyl Peroxide 5% Gel as directed on instruction sheet.  Notify your doctor if there is any change in your medical condition (cold, fever, infection).  Wear comfortable clothing (specific to your surgery type) to the hospital.  After surgery, you can help prevent lung complications by doing breathing exercises.  Take deep breaths and cough every 1-2 hours. Your doctor may order a device called an Incentive Spirometer to help you take deep breaths.  If you are being admitted to the hospital overnight, leave your suitcase in the car. After surgery it may be brought to your room.  In case of increased patient census, it may be necessary for you,  the patient, to continue your postoperative care in the Same Day Surgery department.  If you are being discharged the day of surgery, you will not be allowed to drive home. You will need a responsible individual to drive you home and stay with you for 24 hours after surgery.   If you are taking public transportation, you will need to have a responsible individual with you.  Please call the Pre-admissions Testing Dept. at [phone] if you have any questions about these instructions.  Surgery Visitation Policy:  Patients having surgery or a procedure may have two visitors.  Children under the age of 55 must have an adult with them who is not the patient.  Inpatient Visitation:    Visiting hours are 7 a.m. to 8 p.m. Up to four visitors are allowed at one time in a patient room. The visitors may rotate out with other people during the day.  One visitor age 87 or older may stay with the patient overnight and must be in the room by 8 p.m.   Merchandiser, Retail to address health-related social needs:  [email or web address]    Pre-operative 4 CHG Bath Instructions   You can play a key role in reducing the risk of infection after surgery. Your skin needs to be as free of germs as possible. You can reduce the number of germs on your skin by washing with CHG (chlorhexidine  gluconate) soap before surgery. CHG is an antiseptic soap that kills germs and continues to kill germs even after washing.   DO NOT use if you have an allergy  to chlorhexidine /CHG or antibacterial soaps. If your skin becomes reddened or irritated, stop using the CHG and notify one of our RNs at [phone].   Please shower with the CHG soap starting 4 days before surgery using the following schedule: 02/06 - 02/09.    Please keep in mind the following:  DO NOT shave, including legs and underarms, starting the day of your first shower.   You may shave your face at any point before/day of surgery.   Place clean sheets on your bed the day you start using CHG soap. Use a clean washcloth (not used since being washed) for each shower. DO NOT sleep with pets once you start using the CHG.   CHG Shower Instructions:  If you choose to wash your hair and private area, wash first with your normal shampoo/soap.  After you use shampoo/soap, rinse your hair and body thoroughly to remove shampoo/soap residue.  Turn the water OFF and apply about 3 tablespoons (45 ml) of CHG soap to a CLEAN washcloth.  Apply CHG soap ONLY FROM YOUR NECK DOWN TO YOUR TOES (washing for 3-5 minutes)  DO NOT use CHG soap on face, private areas, open wounds, or sores.  Pay special attention to the area where your surgery is being performed.  If you are having back surgery, having someone wash your back for you may be helpful. Wait 2 minutes after CHG soap is applied, then you may rinse off the CHG soap.  Tamara Dodson  dry with a clean towel  Put on clean clothes/pajamas   If you choose to wear lotion, please use ONLY the CHG-compatible lotions on the back of this paper.     Additional instructions for the day of surgery: DO NOT APPLY any lotions, deodorants, cologne, or perfumes.   Put on clean/comfortable clothes.  Brush your teeth.  Ask your nurse before applying any prescription medications to the skin.      CHG Compatible Lotions   Aveeno Moisturizing lotion  Cetaphil Moisturizing Cream  Cetaphil Moisturizing Lotion  Clairol Herbal Essence Moisturizing Lotion, Dry Skin  Clairol Herbal Essence Moisturizing Lotion, Extra Dry Skin  Clairol Herbal Essence Moisturizing Lotion, Normal Skin  Curel Age Defying Therapeutic Moisturizing Lotion with Alpha Hydroxy  Curel Extreme Care Body Lotion  Curel Soothing Hands Moisturizing Hand Lotion  Curel Therapeutic Moisturizing Cream, Fragrance-Free  Curel Therapeutic Moisturizing Lotion, Fragrance-Free  Curel Therapeutic Moisturizing Lotion, Original Formula  Eucerin Daily  Replenishing Lotion  Eucerin Dry Skin Therapy Plus Alpha Hydroxy Crme  Eucerin Dry Skin Therapy Plus Alpha Hydroxy Lotion  Eucerin Original Crme  Eucerin Original Lotion  Eucerin Plus Crme Eucerin Plus Lotion  Eucerin TriLipid Replenishing Lotion  Keri Anti-Bacterial Hand Lotion  Keri Deep Conditioning Original Lotion Dry Skin Formula Softly Scented  Keri Deep Conditioning Original Lotion, Fragrance Free Sensitive Skin Formula  Keri Lotion Fast Absorbing Fragrance Free Sensitive Skin Formula  Keri Lotion Fast Absorbing Softly Scented Dry Skin Formula  Keri Original Lotion  Keri Skin Renewal Lotion Keri Silky Smooth Lotion  Keri Silky Smooth Sensitive Skin Lotion  Nivea Body Creamy Conditioning Oil  Nivea Body Extra Enriched Lotion  Nivea Body Original Lotion  Nivea Body Sheer Moisturizing Lotion Nivea Crme  Nivea Skin Firming Lotion  NutraDerm 30 Skin Lotion  NutraDerm Skin Lotion  NutraDerm Therapeutic Skin Cream  NutraDerm Therapeutic Skin Lotion  ProShield Protective Hand Cream  Provon moisturizing lotion  Preparing for Total Shoulder Arthroplasty  Before surgery, you can play an important role by reducing the number of germs on your skin by using the following products:  Benzoyl Peroxide Gel  o Reduces the number of germs present on the skin  o Applied twice a day to shoulder area starting two days before surgery  Chlorhexidine  Gluconate (CHG) Soap  o An antiseptic cleaner that kills germs and bonds with the skin to continue killing germs even after washing  o Used for showering the night before surgery and morning of surgery  BENZOYL PEROXIDE 5% GEL                               Please do not use if you have an allergy  to benzoyl peroxide. If your skin becomes reddened/irritated stop using the benzoyl peroxide.  Starting two days before surgery, apply as follows:  1. Apply benzoyl peroxide in the morning and at night. Apply after taking a shower. If you are  not taking a shower, clean entire shoulder front, back, and side along with the armpit with a clean wet washcloth.  2. Place a quarter-sized dollop on your shoulder and rub in thoroughly, making sure to cover the front, back, and side of your shoulder, along with the armpit.  2 days before _02/08 x1 in ___ AM _x 1 in___ PM 1 day before _02/09 x 1 ___ AM _x 1 in___ PM  3. Do this twice a day for two days. (Last application is the night  before surgery, AFTER using the CHG soap).  4. Do NOT apply benzoyl peroxide gel on the day of surgery.

## 2025-01-28 ENCOUNTER — Ambulatory Visit: Admission: RE | Admit: 2025-01-28 | Source: Home / Self Care | Admitting: Orthopedic Surgery

## 2025-01-28 ENCOUNTER — Encounter: Payer: Self-pay | Admitting: Urgent Care

## 2025-01-28 ENCOUNTER — Encounter: Admission: RE | Payer: Self-pay | Source: Home / Self Care

## 2025-01-28 SURGERY — REVISION, TOTAL ARTHROPLASTY, SHOULDER
Anesthesia: Choice | Site: Shoulder | Laterality: Left

## 2025-03-28 ENCOUNTER — Inpatient Hospital Stay

## 2025-04-01 ENCOUNTER — Inpatient Hospital Stay: Admitting: Oncology
